# Patient Record
Sex: Female | Born: 1945 | Race: Black or African American | Hispanic: No | Marital: Married | Smoking: Never smoker
Health system: Southern US, Community
[De-identification: ages and names within clinical notes are randomized; demographics above are authoritative.]

## PROBLEM LIST (undated history)

## (undated) DIAGNOSIS — C50919 Malignant neoplasm of unspecified site of unspecified female breast: Secondary | ICD-10-CM

## (undated) DIAGNOSIS — G629 Polyneuropathy, unspecified: Secondary | ICD-10-CM

## (undated) DIAGNOSIS — H538 Other visual disturbances: Secondary | ICD-10-CM

## (undated) DIAGNOSIS — Z923 Personal history of irradiation: Secondary | ICD-10-CM

## (undated) DIAGNOSIS — I10 Essential (primary) hypertension: Secondary | ICD-10-CM

## (undated) DIAGNOSIS — E785 Hyperlipidemia, unspecified: Secondary | ICD-10-CM

## (undated) DIAGNOSIS — Z9221 Personal history of antineoplastic chemotherapy: Secondary | ICD-10-CM

## (undated) HISTORY — PX: BREAST BIOPSY: SHX20

## (undated) HISTORY — DX: Essential (primary) hypertension: I10

## (undated) HISTORY — DX: Hyperlipidemia, unspecified: E78.5

---

## 1985-08-05 HISTORY — PX: ABDOMINAL HYSTERECTOMY: SHX81

## 1991-08-06 HISTORY — PX: BUNIONECTOMY: SHX129

## 1998-02-16 ENCOUNTER — Ambulatory Visit (HOSPITAL_COMMUNITY): Admission: RE | Admit: 1998-02-16 | Discharge: 1998-02-16 | Payer: Self-pay | Admitting: Family Medicine

## 1998-12-07 ENCOUNTER — Other Ambulatory Visit: Admission: RE | Admit: 1998-12-07 | Discharge: 1998-12-07 | Payer: Self-pay | Admitting: Obstetrics and Gynecology

## 1999-03-19 ENCOUNTER — Encounter: Payer: Self-pay | Admitting: Family Medicine

## 1999-03-19 ENCOUNTER — Ambulatory Visit (HOSPITAL_COMMUNITY): Admission: RE | Admit: 1999-03-19 | Discharge: 1999-03-19 | Payer: Self-pay | Admitting: Obstetrics and Gynecology

## 2000-01-17 ENCOUNTER — Other Ambulatory Visit: Admission: RE | Admit: 2000-01-17 | Discharge: 2000-01-17 | Payer: Self-pay | Admitting: Obstetrics and Gynecology

## 2000-03-20 ENCOUNTER — Encounter: Payer: Self-pay | Admitting: Obstetrics and Gynecology

## 2000-03-20 ENCOUNTER — Ambulatory Visit (HOSPITAL_COMMUNITY): Admission: RE | Admit: 2000-03-20 | Discharge: 2000-03-20 | Payer: Self-pay | Admitting: Obstetrics and Gynecology

## 2001-03-27 ENCOUNTER — Ambulatory Visit (HOSPITAL_COMMUNITY): Admission: RE | Admit: 2001-03-27 | Discharge: 2001-03-27 | Payer: Self-pay | Admitting: Family Medicine

## 2001-03-27 ENCOUNTER — Encounter: Payer: Self-pay | Admitting: Family Medicine

## 2001-06-09 ENCOUNTER — Other Ambulatory Visit: Admission: RE | Admit: 2001-06-09 | Discharge: 2001-06-09 | Payer: Self-pay | Admitting: Obstetrics and Gynecology

## 2001-09-07 ENCOUNTER — Other Ambulatory Visit: Admission: RE | Admit: 2001-09-07 | Discharge: 2001-09-07 | Payer: Self-pay | Admitting: Obstetrics and Gynecology

## 2001-12-04 ENCOUNTER — Other Ambulatory Visit: Admission: RE | Admit: 2001-12-04 | Discharge: 2001-12-04 | Payer: Self-pay | Admitting: Obstetrics and Gynecology

## 2002-03-16 ENCOUNTER — Other Ambulatory Visit: Admission: RE | Admit: 2002-03-16 | Discharge: 2002-03-16 | Payer: Self-pay | Admitting: Obstetrics and Gynecology

## 2010-05-09 LAB — HEPATIC FUNCTION PANEL: AST: 22 U/L (ref 13–35)

## 2010-05-09 LAB — LIPID PANEL: HDL: 66 mg/dL (ref 35–70)

## 2011-01-14 ENCOUNTER — Ambulatory Visit (INDEPENDENT_AMBULATORY_CARE_PROVIDER_SITE_OTHER): Payer: Medicare Other | Admitting: Family Medicine

## 2011-01-14 ENCOUNTER — Encounter: Payer: Self-pay | Admitting: Family Medicine

## 2011-01-14 ENCOUNTER — Other Ambulatory Visit (HOSPITAL_COMMUNITY)
Admission: RE | Admit: 2011-01-14 | Discharge: 2011-01-14 | Disposition: A | Discharge status: Home / Self Care | Payer: Medicare Other | Source: Ambulatory Visit | Attending: Family Medicine | Admitting: Family Medicine

## 2011-01-14 VITALS — BP 140/80 | HR 57 | Temp 98.4°F | Ht 62.5 in | Wt 139.2 lb

## 2011-01-14 DIAGNOSIS — I10 Essential (primary) hypertension: Secondary | ICD-10-CM

## 2011-01-14 DIAGNOSIS — Z124 Encounter for screening for malignant neoplasm of cervix: Secondary | ICD-10-CM

## 2011-01-14 DIAGNOSIS — R5381 Other malaise: Secondary | ICD-10-CM

## 2011-01-14 DIAGNOSIS — L659 Nonscarring hair loss, unspecified: Secondary | ICD-10-CM | POA: Insufficient documentation

## 2011-01-14 DIAGNOSIS — R87810 Cervical high risk human papillomavirus (HPV) DNA test positive: Secondary | ICD-10-CM | POA: Insufficient documentation

## 2011-01-14 DIAGNOSIS — Z0000 Encounter for general adult medical examination without abnormal findings: Secondary | ICD-10-CM | POA: Insufficient documentation

## 2011-01-14 DIAGNOSIS — R5383 Other fatigue: Secondary | ICD-10-CM | POA: Insufficient documentation

## 2011-01-14 DIAGNOSIS — E785 Hyperlipidemia, unspecified: Secondary | ICD-10-CM

## 2011-01-14 DIAGNOSIS — Z1231 Encounter for screening mammogram for malignant neoplasm of breast: Secondary | ICD-10-CM

## 2011-01-14 DIAGNOSIS — Z0289 Encounter for other administrative examinations: Secondary | ICD-10-CM

## 2011-01-14 LAB — CBC WITH DIFFERENTIAL/PLATELET
Basophils Absolute: 0 10*3/uL (ref 0.0–0.1)
Hemoglobin: 13.4 g/dL (ref 12.0–15.0)
Lymphocytes Relative: 34.6 % (ref 12.0–46.0)
Monocytes Relative: 7.7 % (ref 3.0–12.0)
Neutro Abs: 2.2 10*3/uL (ref 1.4–7.7)
Platelets: 311 10*3/uL (ref 150.0–400.0)
RDW: 12.8 % (ref 11.5–14.6)

## 2011-01-14 LAB — TSH: TSH: 1.13 u[IU]/mL (ref 0.35–5.50)

## 2011-01-14 MED ORDER — HYDROCHLOROTHIAZIDE 25 MG PO TABS: 25.0000 mg | ORAL_TABLET | Freq: Every day | ORAL | Status: DC

## 2011-01-14 NOTE — Progress Notes (Signed)
65 yo here to establish care and for Welcome to Medicare physical.  I have personally reviewed the Medicare Annual Wellness questionnaire and have noted 1. The patient's medical and social history- see below 2. Their use of alcohol, tobacco or illicit drugs- no use of drugs 3. Their current medications and supplements- see med list 4. The patient's functional ability including ADL's, fall risks, home safety risks and hearing or visual             Impairment.- independent for all ADLs, AIDLs. 5. Diet and physical activities- very physically active 6. Evidence for depression or mood disorders- denies any signs or symptoms of depression.  No complaints other than increased hair loss over past several months.   Denies any other symptoms of hypo or hyperthyroidism. No new perms or other hair products.  HTN- has been on amlodipine 5 mg, Bisoprolol/HCTZ 10/6.25 for years. Denies any CP, SOB or LE edema. No blurred vision.  HLD- on Pravastatin 20 mg.  Denies any myalgias. Brings in labs from 05/2010.  Lab Results  Component Value Date   HDL 66 05/09/2010   Lab Results  Component Value Date   LDLCALC 73 05/09/2010   Lab Results  Component Value Date   TRIG 53 05/09/2010    Lab Results  Component Value Date   ALT 23 05/09/2010   AST 22 05/09/2010   G2P1, had a hysterecomy in the 70s, she is not sure what remains. ?cervix. Denies any vaginal discharge, postmenopausal bleeding, or dysuria.    The PMH, PSH, Social History, Family History, Medications, and allergies have been reviewed in [facility], and have been updated if relevant.  ROS: See HPI Patient reports no  vision/ hearing changes,anorexia, weight change, fever ,adenopathy, persistant / recurrent hoarseness, swallowing issues, chest pain, edema,persistant / recurrent cough, hemoptysis, dyspnea(rest, exertional, paroxysmal nocturnal), gastrointestinal  bleeding (melena, rectal bleeding), abdominal pain, excessive heart burn, GU  symptoms(dysuria, hematuria, pyuria, voiding/incontinence  Issues) syncope, focal weakness, severe memory loss, concerning skin lesions, depression, anxiety, abnormal bruising/bleeding, major joint swelling, breast masses or abnormal vaginal bleeding.    Physical exam: BP 140/80  Pulse 57  Temp(Src) 98.4 F (36.9 C) (Oral)  Ht 5' 2.5" (1.588 m)  Wt 139 lb 4 oz (63.163 kg)  BMI 25.06 kg/m2  General:  Well-developed,well-nourished,in no acute distress; alert,appropriate and cooperative throughout examination Head:  normocephalic and atraumatic.   Eyes:  vision grossly intact, pupils equal, pupils round, and pupils reactive to light.   Ears:  R ear normal and L ear normal.   Nose:  no external deformity.   Mouth:  good dentition.   Neck:  No deformities, masses, or tenderness noted. Breasts:  No mass, nodules, thickening, tenderness, bulging, retraction, inflamation, nipple discharge or skin changes noted.   Lungs:  Normal respiratory effort, chest expands symmetrically. Lungs are clear to auscultation, no crackles or wheezes. Heart:  Normal rate and regular rhythm. S1 and S2 normal without gallop, murmur, click, rub or other extra sounds. Abdomen:  Bowel sounds positive,abdomen soft and non-tender without masses, organomegaly or hernias noted. Rectal:  no external abnormalities.   Genitalia:  Pelvic Exam:        External: normal female genitalia without lesions or masses        Vagina: normal without lesions or masses        Cervix: absent        Adnexa: normal bimanual exam without masses or fullness        Uterus: absent  Pap smear: performed Msk:  No deformity or scoliosis noted of thoracic or lumbar spine.   Extremities:  No clubbing, cyanosis, edema, or deformity noted with normal full range of motion of all joints.   Neurologic:  alert & oriented X3 and gait normal.   Skin:  Intact without suspicious lesions or rashes Cervical Nodes:  No lymphadenopathy noted Axillary  Nodes:  No palpable lymphadenopathy Psych:  Cognition and judgment appear intact. Alert and cooperative with normal attention span and concentration. No apparent delusions, illusions, hallucinations  1. Hypertension   Stable but very bradycardic. Will d/c bisprolol and increase HCTZ. Pt to follow up BP in 2 weeks.  Check BMET today.   2. Hyperlipidemia  Stable.  Recheck lipid panel and hepatic panel today.   4. Routine general medical examination at a health care facility   The patients weight, height, BMI and visual acuity have been recorded in the chart I have made referrals, counseling and provided education to the patient based review of the above and I have provided the pt with a written personalized care plan for preventive services.  Cytology -Pap Smear  5. Hair loss and Fatigue- May be due to bradycardia. Will also check TSH and CBC.    TSH, CBC

## 2011-01-14 NOTE — Patient Instructions (Signed)
Please stop taking your bisporolol/HCTZ.  Continue Amlodipine and start taking HCTZ 25 mg daily. Come in 2 weeks for a nurse visit to recheck your blood pressure. Please stop by to see Shirlee Limerick on your way out.

## 2011-01-15 ENCOUNTER — Encounter: Payer: Self-pay | Admitting: *Deleted

## 2011-01-15 ENCOUNTER — Other Ambulatory Visit: Payer: Medicare Other

## 2011-01-15 ENCOUNTER — Other Ambulatory Visit: Payer: Self-pay | Admitting: Family Medicine

## 2011-01-15 DIAGNOSIS — Z1211 Encounter for screening for malignant neoplasm of colon: Secondary | ICD-10-CM

## 2011-01-15 LAB — FECAL OCCULT BLOOD, IMMUNOCHEMICAL: Fecal Occult Bld: NEGATIVE

## 2011-01-31 ENCOUNTER — Ambulatory Visit: Payer: Medicare Other | Admitting: Family Medicine

## 2011-01-31 VITALS — BP 126/82 | HR 77 | Resp 18

## 2011-01-31 DIAGNOSIS — I10 Essential (primary) hypertension: Secondary | ICD-10-CM

## 2011-01-31 NOTE — Progress Notes (Signed)
  Subjective:    Patient ID: Sandra [name], female    DOB: 12-Mar-1946, 65 y.o.   MRN: [id number]  HPI    Review of Systems     Objective:   Physical Exam        Assessment & Plan:

## 2011-01-31 NOTE — Progress Notes (Signed)
  Subjective:    Patient ID: Sandra [name], female    DOB: 01-05-1946, 65 y.o.   MRN: [id number]  HPI  Patient coming in for blood pressure check, comparing her machine to ours.  Review of Systems     Objective:   Physical Exam        Assessment & Plan:

## 2011-02-21 ENCOUNTER — Ambulatory Visit: Payer: Self-pay | Admitting: Family Medicine

## 2011-03-01 ENCOUNTER — Encounter: Payer: Self-pay | Admitting: *Deleted

## 2011-03-01 ENCOUNTER — Encounter: Payer: Self-pay | Admitting: Family Medicine

## 2011-03-08 ENCOUNTER — Encounter: Payer: Self-pay | Admitting: Family Medicine

## 2011-03-08 ENCOUNTER — Ambulatory Visit (INDEPENDENT_AMBULATORY_CARE_PROVIDER_SITE_OTHER): Payer: Medicare Other | Admitting: Family Medicine

## 2011-03-08 DIAGNOSIS — L659 Nonscarring hair loss, unspecified: Secondary | ICD-10-CM

## 2011-03-08 DIAGNOSIS — R5383 Other fatigue: Secondary | ICD-10-CM

## 2011-03-08 DIAGNOSIS — R5381 Other malaise: Secondary | ICD-10-CM

## 2011-03-08 NOTE — Progress Notes (Signed)
No appetite for 4 days.  Ate Monday night but little solids since then.  Fatigued.  Some diarrhea on Tuesday.  Taking liquids, making urine w/o complication.  No FCNAV.  No sick contacts.  No pain, cough, chills, or other sx.  No blood in stool.  She had some hair loss over last 6 months.  Weight is stable.    Mother and sister with h/o hair loss.    TSH and other labs recently unremarkable.   Meds, vitals, and allergies reviewed.   ROS: See HPI.  Otherwise, noncontributory.  GEN: nad, alert and oriented HEENT: mucous membranes moist, tm wnl, B temporal balding NECK: supple w/o LA CV: rrr PULM: ctab, no inc wob ABD: soft, +bs, not ttp EXT: no edema SKIN: no acute rash

## 2011-03-08 NOTE — Patient Instructions (Signed)
Let us know if your appetite doesn't return to normal by next week, if the hair loss continues, or if you have other concerns.  Take care.

## 2011-03-09 NOTE — Assessment & Plan Note (Signed)
With prev labs noted and unremarkable.  4 days of dec in appetite.  This may be a superimposed viral illness, with the episode of diarrhea.  I would follow this clinically.  She doesn't appear depressed.  Affect wnl and she doesn't have depressive sx.  We talked about the plan and she agrees.

## 2011-03-09 NOTE — Assessment & Plan Note (Signed)
She'll let us know if progressive.  It appears to be familial.  Can refer to derm if worsening.  Scalp appears wnl, except for the thinning hair.

## 2011-05-13 ENCOUNTER — Other Ambulatory Visit: Payer: Self-pay | Admitting: Family Medicine

## 2011-07-02 ENCOUNTER — Other Ambulatory Visit: Payer: Self-pay | Admitting: *Deleted

## 2011-07-02 MED ORDER — AMLODIPINE BESYLATE 5 MG PO TABS: 5.0000 mg | ORAL_TABLET | Freq: Every day | ORAL | Status: DC

## 2011-07-09 ENCOUNTER — Encounter: Payer: Self-pay | Admitting: Family Medicine

## 2011-07-09 ENCOUNTER — Ambulatory Visit: Payer: Self-pay | Admitting: Family Medicine

## 2011-07-09 ENCOUNTER — Ambulatory Visit (INDEPENDENT_AMBULATORY_CARE_PROVIDER_SITE_OTHER): Payer: Medicare Other | Admitting: Family Medicine

## 2011-07-09 VITALS — BP 120/74 | HR 94 | Temp 98.9°F | Ht 62.25 in | Wt 137.5 lb

## 2011-07-09 DIAGNOSIS — J069 Acute upper respiratory infection, unspecified: Secondary | ICD-10-CM

## 2011-07-09 NOTE — Progress Notes (Signed)
SUBJECTIVE:  Sandra [name] is a 65 y.o. female who complains of coryza, congestion, sore throat and dry cough for 3 days. She denies a history of anorexia, chest pain, dizziness, fatigue and fevers and denies a history of asthma. Patient denies smoke cigarettes.   Patient Active Problem List  Diagnoses  . Hypertension  . Hyperlipidemia  . Routine general medical examination at a health care facility  . Hair loss  . Fatigue  . URI (upper respiratory infection)   Past Medical History  Diagnosis Date  . Hypertension   . Hyperlipidemia    Past Surgical History  Procedure Date  . Abdominal hysterectomy    History  Substance Use Topics  . Smoking status: Never Smoker   . Smokeless tobacco: Not on file  . Alcohol Use: Not on file   Family History  Problem Relation Age of Onset  . Cancer Sister 30    breast cancer   No Known Allergies Current Outpatient Prescriptions on File Prior to Visit  Medication Sig Dispense Refill  . amLODipine (NORVASC) 5 MG tablet Take 1 tablet (5 mg total) by mouth daily.  30 tablet  6  . Biotin 5000 MCG CAPS Take 1 capsule by mouth daily.        . Calcium Carbonate (CALCIUM 500 PO) Take 1 tablet by mouth daily.        . hydrochlorothiazide (HYDRODIURIL) 25 MG tablet TAKE ONE TABLET BY MOUTH EVERY DAY  30 tablet  6  . pravastatin (PRAVACHOL) 20 MG tablet Take 20 mg by mouth daily.        . vitamin B-12 (CYANOCOBALAMIN) 1000 MCG tablet Take 1,000 mcg by mouth daily.         The PMH, PSH, Social History, Family History, Medications, and allergies have been reviewed in [facility], and have been updated if relevant.  OBJECTIVE: BP 120/74  Pulse 94  Temp(Src) 98.9 F (37.2 C) (Oral)  Ht 5' 2.25" (1.581 m)  Wt 137 lb 8 oz (62.37 kg)  BMI 24.95 kg/m2  She appears well, vital signs are as noted. Ears normal.  Throat and pharynx normal.  Neck supple. No adenopathy in the neck. Nose is congested. Sinuses non tender. The chest is clear, without wheezes or  rales.  ASSESSMENT:  viral upper respiratory illness  PLAN: Symptomatic therapy suggested: push fluids, rest and return office visit prn if symptoms persist or worsen. Lack of antibiotic effectiveness discussed with her. Call or return to clinic prn if these symptoms worsen or fail to improve as anticipated.

## 2011-07-17 ENCOUNTER — Other Ambulatory Visit: Payer: Self-pay | Admitting: *Deleted

## 2011-07-17 MED ORDER — PRAVASTATIN SODIUM 20 MG PO TABS: 20.0000 mg | ORAL_TABLET | Freq: Every day | ORAL | Status: DC

## 2011-08-06 HISTORY — PX: OTHER SURGICAL HISTORY: SHX169

## 2011-10-28 ENCOUNTER — Ambulatory Visit (INDEPENDENT_AMBULATORY_CARE_PROVIDER_SITE_OTHER)
Admission: RE | Admit: 2011-10-28 | Discharge: 2011-10-28 | Disposition: A | Discharge status: Home / Self Care | Payer: Medicare Other | Source: Ambulatory Visit | Attending: Family Medicine | Admitting: Family Medicine

## 2011-10-28 ENCOUNTER — Encounter: Payer: Self-pay | Admitting: Family Medicine

## 2011-10-28 ENCOUNTER — Ambulatory Visit (INDEPENDENT_AMBULATORY_CARE_PROVIDER_SITE_OTHER): Payer: Medicare Other | Admitting: Family Medicine

## 2011-10-28 VITALS — BP 110/80 | HR 72 | Temp 97.9°F | Wt 136.0 lb

## 2011-10-28 DIAGNOSIS — M719 Bursopathy, unspecified: Secondary | ICD-10-CM

## 2011-10-28 DIAGNOSIS — M75102 Unspecified rotator cuff tear or rupture of left shoulder, not specified as traumatic: Secondary | ICD-10-CM | POA: Insufficient documentation

## 2011-10-28 DIAGNOSIS — M67919 Unspecified disorder of synovium and tendon, unspecified shoulder: Secondary | ICD-10-CM

## 2011-10-28 MED ORDER — TRAMADOL HCL 50 MG PO TABS: 50.0000 mg | ORAL_TABLET | Freq: Three times a day (TID) | ORAL | Status: AC | PRN

## 2011-10-28 NOTE — Progress Notes (Signed)
SUBJECTIVE: Sandra [name] is a 66 y.o. female who sustained a left shoulder injury 1 month(s) ago. Mechanism of injury: not sure- she was moving- lifting heavy furniture. Immediate symptoms: delayed pain. Symptoms have been chronic since that time. Prior history of related problems: no prior problems with this area in the past.  Patient Active Problem List  Diagnoses  . Hypertension  . Hyperlipidemia  . Routine general medical examination at a health care facility  . Hair loss  . Fatigue  . URI (upper respiratory infection)  . Rotator cuff syndrome of left shoulder   Past Medical History  Diagnosis Date  . Hypertension   . Hyperlipidemia    Past Surgical History  Procedure Date  . Abdominal hysterectomy    History  Substance Use Topics  . Smoking status: Never Smoker   . Smokeless tobacco: Not on file  . Alcohol Use: Not on file   Family History  Problem Relation Age of Onset  . Cancer Sister 25    breast cancer   No Known Allergies Current Outpatient Prescriptions on File Prior to Visit  Medication Sig Dispense Refill  . amLODipine (NORVASC) 5 MG tablet Take 1 tablet (5 mg total) by mouth daily.  30 tablet  6  . Biotin 5000 MCG CAPS Take 1 capsule by mouth daily.        . Calcium Carbonate (CALCIUM 500 PO) Take 1 tablet by mouth daily.        . hydrochlorothiazide (HYDRODIURIL) 25 MG tablet TAKE ONE TABLET BY MOUTH EVERY DAY  30 tablet  6  . pravastatin (PRAVACHOL) 20 MG tablet Take 1 tablet (20 mg total) by mouth daily.  30 tablet  6  . vitamin B-12 (CYANOCOBALAMIN) 1000 MCG tablet Take 1,000 mcg by mouth daily.         The PMH, PSH, Social History, Family History, Medications, and allergies have been reviewed in [facility], and have been updated if relevant.  OBJECTIVE: BP 110/80  Pulse 72  Temp(Src) 97.9 F (36.6 C) (Oral)  Wt 136 lb (61.689 kg)  Vital signs as noted above. Appearance: alert, well appearing, and in no distress. Shoulder exam: no tenderness- pos  empty can test, pos arch sign, unable to touch thumb to middle of back due to pain X-ray: ordered, but results not yet available.  ASSESSMENT: Shoulder  Rotator cuff injury- tendonitis vs partial tear  PLAN: rest the injured area as much as practical, apply heat (warned not to sleep on heating pad), PT. See orders for this visit as documented in the electronic medical record.

## 2011-10-28 NOTE — Patient Instructions (Signed)
You have rotator cuff impingement Try to avoid painful activities (overhead activities, lifting with extended arm) as much as possible. Aleve, tramadol and/or tylenol as needed for pain.  Start physical therapy with transition to home exercise program. If not improving at follow-up we will consider further imaging.

## 2011-12-09 ENCOUNTER — Other Ambulatory Visit: Payer: Self-pay | Admitting: Family Medicine

## 2011-12-12 ENCOUNTER — Other Ambulatory Visit: Payer: Self-pay | Admitting: Family Medicine

## 2011-12-12 NOTE — Telephone Encounter (Signed)
Pt called to ck on refill HCTZ 25 mg. Filled on 12/09/11 at [facility] [address]. Pt will ck with pharmacy for refill.

## 2012-01-02 ENCOUNTER — Other Ambulatory Visit: Payer: Self-pay | Admitting: Family Medicine

## 2012-01-02 DIAGNOSIS — E538 Deficiency of other specified B group vitamins: Secondary | ICD-10-CM

## 2012-01-02 DIAGNOSIS — Z0000 Encounter for general adult medical examination without abnormal findings: Secondary | ICD-10-CM

## 2012-01-02 DIAGNOSIS — I10 Essential (primary) hypertension: Secondary | ICD-10-CM

## 2012-01-02 DIAGNOSIS — E785 Hyperlipidemia, unspecified: Secondary | ICD-10-CM

## 2012-01-08 ENCOUNTER — Other Ambulatory Visit (INDEPENDENT_AMBULATORY_CARE_PROVIDER_SITE_OTHER): Payer: Medicare Other

## 2012-01-08 DIAGNOSIS — I10 Essential (primary) hypertension: Secondary | ICD-10-CM

## 2012-01-08 DIAGNOSIS — E538 Deficiency of other specified B group vitamins: Secondary | ICD-10-CM

## 2012-01-08 DIAGNOSIS — E785 Hyperlipidemia, unspecified: Secondary | ICD-10-CM

## 2012-01-08 LAB — LIPID PANEL
LDL Cholesterol: 58 mg/dL (ref 0–99)
Total CHOL/HDL Ratio: 2

## 2012-01-08 LAB — COMPREHENSIVE METABOLIC PANEL
AST: 22 U/L (ref 0–37)
Albumin: 4 g/dL (ref 3.5–5.2)
Alkaline Phosphatase: 42 U/L (ref 39–117)
Calcium: 8.8 mg/dL (ref 8.4–10.5)
Chloride: 105 mEq/L (ref 96–112)
Potassium: 3.4 mEq/L — ABNORMAL LOW (ref 3.5–5.1)
Sodium: 140 mEq/L (ref 135–145)
Total Protein: 6.7 g/dL (ref 6.0–8.3)

## 2012-01-08 LAB — VITAMIN B12: Vitamin B-12: 323 pg/mL (ref 211–911)

## 2012-01-15 ENCOUNTER — Encounter: Payer: Self-pay | Admitting: Family Medicine

## 2012-01-15 ENCOUNTER — Ambulatory Visit (INDEPENDENT_AMBULATORY_CARE_PROVIDER_SITE_OTHER): Payer: Medicare Other | Admitting: Family Medicine

## 2012-01-15 ENCOUNTER — Encounter: Payer: Self-pay | Admitting: Internal Medicine

## 2012-01-15 VITALS — BP 112/80 | HR 68 | Temp 97.9°F | Ht 62.0 in | Wt 132.0 lb

## 2012-01-15 DIAGNOSIS — I10 Essential (primary) hypertension: Secondary | ICD-10-CM

## 2012-01-15 DIAGNOSIS — Z23 Encounter for immunization: Secondary | ICD-10-CM

## 2012-01-15 DIAGNOSIS — Z1211 Encounter for screening for malignant neoplasm of colon: Secondary | ICD-10-CM

## 2012-01-15 DIAGNOSIS — Z1231 Encounter for screening mammogram for malignant neoplasm of breast: Secondary | ICD-10-CM

## 2012-01-15 DIAGNOSIS — D179 Benign lipomatous neoplasm, unspecified: Secondary | ICD-10-CM

## 2012-01-15 DIAGNOSIS — Z780 Asymptomatic menopausal state: Secondary | ICD-10-CM

## 2012-01-15 DIAGNOSIS — E785 Hyperlipidemia, unspecified: Secondary | ICD-10-CM

## 2012-01-15 DIAGNOSIS — Z0000 Encounter for general adult medical examination without abnormal findings: Secondary | ICD-10-CM

## 2012-01-15 MED ORDER — AMLODIPINE BESYLATE 5 MG PO TABS: 5.0000 mg | ORAL_TABLET | Freq: Every day | ORAL | Status: DC

## 2012-01-15 MED ORDER — HYDROCHLOROTHIAZIDE 25 MG PO TABS: ORAL_TABLET | ORAL | Status: DC

## 2012-01-15 MED ORDER — TRAMADOL HCL 50 MG PO TABS: 50.0000 mg | ORAL_TABLET | Freq: Four times a day (QID) | ORAL | Status: DC | PRN

## 2012-01-15 MED ORDER — PRAVASTATIN SODIUM 20 MG PO TABS: 20.0000 mg | ORAL_TABLET | Freq: Every day | ORAL | Status: DC

## 2012-01-15 NOTE — Progress Notes (Signed)
66 yo here for AMW visit.  I have personally reviewed the Medicare Annual Wellness questionnaire and have noted 1. The patient's medical and social history- see below 2. Their use of alcohol, tobacco or illicit drugs- no use of drugs 3. Their current medications and supplements- see med list 4. The patient's functional ability including ADL's, fall risks, home safety risks and hearing or visual             Impairment.- independent for all ADLs, AIDLs. 5. Diet and physical activities- very physically active 6. Evidence for depression or mood disorders- denies any signs or symptoms of depression.  No complaints other than increased hair loss over past several months.   Denies any other symptoms of hypo or hyperthyroidism. No new perms or other hair products.  HTN- has previously  been on amlodipine 5 mg, Bisoprolol/HCTZ 10/6.25 for years. Last June, very bradycardic at first appointment, so we d/c'd bisprolol and increase HCTZ. Denies any CP, SOB or LE edema. No blurred vision. Lab Results  Component Value Date   CREATININE 0.9 01/08/2012    HLD- on Pravastatin 20 mg.  Denies any myalgias.   Lab Results  Component Value Date   CHOL 131 01/08/2012   HDL 66.60 01/08/2012   LDLCALC 58 01/08/2012   TRIG 32.0 01/08/2012   CHOLHDL 2 01/08/2012    G2P1, had a hysterecomy in the 70s. Pap/pelvic exam last year.  Denies any vaginal discharge, postmenopausal bleeding, or dysuria.    The PMH, PSH, Social History, Family History, Medications, and allergies have been reviewed in [facility], and have been updated if relevant.  ROS: See HPI Patient reports no  vision/ hearing changes,anorexia, weight change, fever ,adenopathy, persistant / recurrent hoarseness, swallowing issues, chest pain, edema,persistant / recurrent cough, hemoptysis, dyspnea(rest, exertional, paroxysmal nocturnal), gastrointestinal  bleeding (melena, rectal bleeding), abdominal pain, excessive heart burn, GU symptoms(dysuria, hematuria,  pyuria, voiding/incontinence  Issues) syncope, focal weakness, severe memory loss, concerning skin lesions, depression, anxiety, abnormal bruising/bleeding, major joint swelling, breast masses or abnormal vaginal bleeding.    Physical exam: BP 112/80  Pulse 68  Temp 97.9 F (36.6 C)  Ht 5\' 2"  (1.575 m)  Wt 132 lb (59.875 kg)  BMI 24.14 kg/m2 Wt Readings from Last 3 Encounters:  01/15/12 132 lb (59.875 kg)  10/28/11 136 lb (61.689 kg)  07/09/11 137 lb 8 oz (62.37 kg)   General:  Well-developed,well-nourished,in no acute distress; alert,appropriate and cooperative throughout examination Head:  normocephalic and atraumatic.   Eyes:  vision grossly intact, pupils equal, pupils round, and pupils reactive to light.   Ears:  R ear normal and L ear normal.   Nose:  no external deformity.   Mouth:  good dentition.   Neck:  No deformities, masses, or tenderness noted. Lungs:  Normal respiratory effort, chest expands symmetrically. Lungs are clear to auscultation, no crackles or wheezes. Heart:  Normal rate and regular rhythm. S1 and S2 normal without gallop, murmur, click, rub or other extra sounds. Abdomen:  Bowel sounds positive,abdomen soft and non-tender without masses, organomegaly or hernias noted. Msk:  No deformity or scoliosis noted of thoracic or lumbar spine.   Extremities:  No clubbing, cyanosis, edema, or deformity noted with normal full range of motion of all joints.   Neurologic:  alert & oriented X3 and gait normal.   Skin:   2.5 inch large freely movable, nontender mass left side, consistent with lipoma Cervical Nodes:  No lymphadenopathy noted Axillary Nodes:  No palpable lymphadenopathy Psych:  Cognition and judgment appear intact. Alert and cooperative with normal attention span and concentration. No apparent delusions, illusions, hallucinations  Assessment and Plan: 1. Routine general medical examination at a health care facility  The patients weight, height, BMI and  visual acuity have been recorded in the chart I have made referrals, counseling and provided education to the patient based review of the above and I have provided the pt with a written personalized care plan for preventive services.    2. Hypertension  Stable on current meds.   3. Hyperlipidemia  Lipid panel improved!   4. Postmenopausal  DG Bone Density  5. Other screening mammogram  MM Digital Screening  6. Colon cancer screening  Ambulatory referral to Gastroenterology  7. Lipoma  Ambulatory referral to General Surgery

## 2012-01-15 NOTE — Addendum Note (Signed)
Addended by: Eliezer Bottom on: 01/15/2012 09:14 AM   Modules accepted: Orders

## 2012-01-15 NOTE — Patient Instructions (Addendum)
Good to see you. Please stop by to see Marion on your way out to set up your referrals.   

## 2012-01-28 ENCOUNTER — Other Ambulatory Visit: Payer: Self-pay | Admitting: Family Medicine

## 2012-02-18 ENCOUNTER — Ambulatory Visit: Payer: Self-pay | Admitting: Anesthesiology

## 2012-02-24 ENCOUNTER — Ambulatory Visit: Payer: Self-pay | Admitting: General Surgery

## 2012-03-05 ENCOUNTER — Encounter: Payer: Self-pay | Admitting: Family Medicine

## 2012-03-05 ENCOUNTER — Ambulatory Visit: Payer: Self-pay | Admitting: Family Medicine

## 2012-03-09 ENCOUNTER — Encounter: Payer: Self-pay | Admitting: Family Medicine

## 2012-03-09 ENCOUNTER — Encounter: Payer: Self-pay | Admitting: *Deleted

## 2012-03-09 LAB — HM MAMMOGRAPHY: HM Mammogram: NORMAL

## 2012-03-12 ENCOUNTER — Encounter: Payer: Medicare Other | Admitting: Internal Medicine

## 2012-04-08 ENCOUNTER — Ambulatory Visit: Payer: Self-pay | Admitting: Family Medicine

## 2012-04-08 ENCOUNTER — Encounter: Payer: Self-pay | Admitting: Family Medicine

## 2013-01-21 ENCOUNTER — Other Ambulatory Visit: Payer: Self-pay | Admitting: Family Medicine

## 2013-01-21 ENCOUNTER — Other Ambulatory Visit (INDEPENDENT_AMBULATORY_CARE_PROVIDER_SITE_OTHER): Payer: Medicare PPO

## 2013-01-21 DIAGNOSIS — E785 Hyperlipidemia, unspecified: Secondary | ICD-10-CM

## 2013-01-21 DIAGNOSIS — I10 Essential (primary) hypertension: Secondary | ICD-10-CM

## 2013-01-21 DIAGNOSIS — R5381 Other malaise: Secondary | ICD-10-CM

## 2013-01-21 DIAGNOSIS — Z0000 Encounter for general adult medical examination without abnormal findings: Secondary | ICD-10-CM

## 2013-01-21 DIAGNOSIS — R5383 Other fatigue: Secondary | ICD-10-CM

## 2013-01-21 LAB — LIPID PANEL
Cholesterol: 137 mg/dL (ref 0–200)
HDL: 59.6 mg/dL (ref >39.00)
Triglycerides: 39 mg/dL (ref 0.0–149.0)

## 2013-01-21 LAB — COMPREHENSIVE METABOLIC PANEL
AST: 22 U/L (ref 0–37)
BUN: 13 mg/dL (ref 6–23)
CO2: 26 mEq/L (ref 19–32)
Calcium: 9.2 mg/dL (ref 8.4–10.5)
Chloride: 104 mEq/L (ref 96–112)
Creatinine, Ser: 0.9 mg/dL (ref 0.4–1.2)
GFR: 85.54 mL/min (ref >60.00)

## 2013-01-21 LAB — CBC WITH DIFFERENTIAL/PLATELET
Basophils Absolute: 0.1 10*3/uL (ref 0.0–0.1)
HCT: 38.8 % (ref 36.0–46.0)
Lymphs Abs: 1.3 10*3/uL (ref 0.7–4.0)
MCV: 89.7 fl (ref 78.0–100.0)
Monocytes Absolute: 0.3 10*3/uL (ref 0.1–1.0)
Platelets: 307 10*3/uL (ref 150.0–400.0)
RDW: 12.7 % (ref 11.5–14.6)

## 2013-01-26 ENCOUNTER — Other Ambulatory Visit (HOSPITAL_COMMUNITY)
Admission: RE | Admit: 2013-01-26 | Discharge: 2013-01-26 | Disposition: A | Discharge status: Home / Self Care | Payer: Medicare PPO | Source: Ambulatory Visit | Attending: Family Medicine | Admitting: Family Medicine

## 2013-01-26 ENCOUNTER — Ambulatory Visit (INDEPENDENT_AMBULATORY_CARE_PROVIDER_SITE_OTHER): Payer: Medicare PPO | Admitting: Family Medicine

## 2013-01-26 ENCOUNTER — Encounter: Payer: Self-pay | Admitting: Family Medicine

## 2013-01-26 VITALS — BP 110/70 | HR 68 | Temp 98.2°F | Ht 62.25 in | Wt 133.0 lb

## 2013-01-26 DIAGNOSIS — Z01419 Encounter for gynecological examination (general) (routine) without abnormal findings: Secondary | ICD-10-CM

## 2013-01-26 DIAGNOSIS — R739 Hyperglycemia, unspecified: Secondary | ICD-10-CM | POA: Insufficient documentation

## 2013-01-26 DIAGNOSIS — L819 Disorder of pigmentation, unspecified: Secondary | ICD-10-CM | POA: Insufficient documentation

## 2013-01-26 DIAGNOSIS — Z124 Encounter for screening for malignant neoplasm of cervix: Secondary | ICD-10-CM | POA: Insufficient documentation

## 2013-01-26 DIAGNOSIS — E785 Hyperlipidemia, unspecified: Secondary | ICD-10-CM

## 2013-01-26 DIAGNOSIS — R7309 Other abnormal glucose: Secondary | ICD-10-CM

## 2013-01-26 DIAGNOSIS — I10 Essential (primary) hypertension: Secondary | ICD-10-CM

## 2013-01-26 DIAGNOSIS — Z0000 Encounter for general adult medical examination without abnormal findings: Secondary | ICD-10-CM

## 2013-01-26 DIAGNOSIS — Z1331 Encounter for screening for depression: Secondary | ICD-10-CM

## 2013-01-26 LAB — HEMOGLOBIN A1C: Hgb A1c MFr Bld: 5 % (ref 4.6–6.5)

## 2013-01-26 MED ORDER — TRAMADOL HCL 50 MG PO TABS: 50.0000 mg | ORAL_TABLET | Freq: Four times a day (QID) | ORAL | Status: DC | PRN

## 2013-01-26 MED ORDER — AMLODIPINE BESYLATE 5 MG PO TABS: ORAL_TABLET | ORAL | Status: DC

## 2013-01-26 MED ORDER — HYDROCHLOROTHIAZIDE 25 MG PO TABS: ORAL_TABLET | ORAL | Status: DC

## 2013-01-26 MED ORDER — PRAVASTATIN SODIUM 20 MG PO TABS: 20.0000 mg | ORAL_TABLET | Freq: Every day | ORAL | Status: DC

## 2013-01-26 NOTE — Progress Notes (Signed)
67 yo pleasant female here for AMW visit.  I have personally reviewed the Medicare Annual Wellness questionnaire and have noted 1. The patient's medical and social history- see below 2. Their use of alcohol, tobacco or illicit drugs- no use of drugs 3. Their current medications and supplements- see med list 4. The patient's functional ability including ADL's, fall risks, home safety risks and hearing or visual             Impairment.- independent for all ADLs, AIDLs. 5. Diet and physical activities- very physically active 6. Evidence for depression or mood disorders- denies any signs or symptoms of depression.  End of life wishes discussed and updated in Social History.  Hyperglycemia- fasting CBG 120.  Denies any increased thirst or urination.  In fact has been walking 20 miles per week!  HTN- has previously  been on amlodipine 5 mg, HCTZ.    Denies any CP, SOB or LE edema. No blurred vision. Lab Results  Component Value Date   CREATININE 0.9 01/21/2013    HLD- on Pravastatin 20 mg.  Denies any myalgias.   Lab Results  Component Value Date   CHOL 137 01/21/2013   HDL 59.60 01/21/2013   LDLCALC 70 01/21/2013   TRIG 39.0 01/21/2013   CHOLHDL 2 01/21/2013    G2P1, had a hysterecomy in the 70s.  Denies any vaginal discharge, postmenopausal bleeding, or dysuria.   She was told a few years ago, she had some "changes of HPV."  Has not had a pelvic exam in a few years.  UTD mammogram. Has had both zostavax and pneumovax. Bone density normal in 04/2012. Referred to GI last year for colonoscopy- was not time yet for colonoscopy.  Due next year.   Appetite good.  Weight stable. Wt Readings from Last 3 Encounters:  01/26/13 133 lb (60.328 kg)  01/15/12 132 lb (59.875 kg)  10/28/11 136 lb (61.689 kg)     Patient Active Problem List   Diagnosis Date Noted  . Lipoma 01/15/2012  . Routine general medical examination at a health care facility 01/14/2011  . Hair loss 01/14/2011  .  Fatigue 01/14/2011  . Hypertension   . Hyperlipidemia    Past Medical History  Diagnosis Date  . Hypertension   . Hyperlipidemia    Past Surgical History  Procedure Laterality Date  . Abdominal hysterectomy     History  Substance Use Topics  . Smoking status: Never Smoker   . Smokeless tobacco: Not on file  . Alcohol Use: Not on file   Family History  Problem Relation Age of Onset  . Cancer Sister 76    breast cancer   Allergies  Allergen Reactions  . Lovastatin Anaphylaxis    Tongue swelling   Current Outpatient Prescriptions on File Prior to Visit  Medication Sig Dispense Refill  . amLODipine (NORVASC) 5 MG tablet Take 1 tablet (5 mg total) by mouth daily.  90 tablet  3  . amLODipine (NORVASC) 5 MG tablet TAKE ONE TABLET BY MOUTH EVERY DAY  30 tablet  11  . Biotin 5000 MCG CAPS Take 1 capsule by mouth daily.        . Calcium Carbonate (CALCIUM 500 PO) Take 1 tablet by mouth daily.        . hydrochlorothiazide (HYDRODIURIL) 25 MG tablet TAKE ONE TABLET BY MOUTH EVERY DAY  90 tablet  3  . pravastatin (PRAVACHOL) 20 MG tablet Take 1 tablet (20 mg total) by mouth daily.  90 tablet  3  . traMADol (ULTRAM) 50 MG tablet Take 1 tablet (50 mg total) by mouth every 6 (six) hours as needed.  30 tablet  1  . vitamin B-12 (CYANOCOBALAMIN) 1000 MCG tablet Take 1,000 mcg by mouth daily.         No current facility-administered medications on file prior to visit.     The PMH, PSH, Social History, Family History, Medications, and allergies have been reviewed in [facility], and have been updated if relevant.  ROS: See HPI Patient reports no  vision/ hearing changes,anorexia, weight change, fever ,adenopathy, persistant / recurrent hoarseness, swallowing issues, chest pain, edema,persistant / recurrent cough, hemoptysis, dyspnea(rest, exertional, paroxysmal nocturnal), gastrointestinal  bleeding (melena, rectal bleeding), abdominal pain, excessive heart burn, GU symptoms(dysuria, hematuria,  pyuria, voiding/incontinence  Issues) syncope, focal weakness, severe memory loss, concerning skin lesions, depression, anxiety, abnormal bruising/bleeding, major joint swelling, breast masses or abnormal vaginal bleeding.    Physical exam: BP 110/70  Pulse 68  Temp(Src) 98.2 F (36.8 C)  Ht 5' 2.25" (1.581 m)  Wt 133 lb (60.328 kg)  BMI 24.14 kg/m2   Wt Readings from Last 3 Encounters:  01/26/13 133 lb (60.328 kg)  01/15/12 132 lb (59.875 kg)  10/28/11 136 lb (61.689 kg)    General:  Well-developed,well-nourished,in no acute distress; alert,appropriate and cooperative throughout examination Head:  normocephalic and atraumatic.   Eyes:  vision grossly intact, pupils equal, pupils round, and pupils reactive to light.   Ears:  R ear normal and L ear normal.   Nose:  no external deformity.   Mouth:  good dentition.   Neck:  No deformities, masses, or tenderness noted. Lungs:  Normal respiratory effort, chest expands symmetrically. Lungs are clear to auscultation, no crackles or wheezes. Heart:  Normal rate and regular rhythm. S1 and S2 normal without gallop, murmur, click, rub or other extra sounds. Abdomen:  Bowel sounds positive,abdomen soft and non-tender without masses, organomegaly or hernias noted. Msk:  No deformity or scoliosis noted of thoracic or lumbar spine.   Extremities:  No clubbing, cyanosis, edema, or deformity noted with normal full range of motion of all joints.   Neurologic:  alert & oriented X3 and gait normal.   Skin:   No lymphadenopathy noted Areas of hyperpigmentation on cheeks bilaterally ( per pt has been there for years but getting larger). Axillary Nodes:  No palpable lymphadenopathy Psych:  Cognition and judgment appear intact. Alert and cooperative with normal attention span and concentration. No apparent delusions, illusions, hallucinations Genitalia:  Pelvic Exam:        External: normal female genitalia without lesions or masses        Vagina:  normal without lesions or masses        Cervix: absent        Adnexa: normal bimanual exam without masses or fullness        Uterus: absent        Pap smear: performed  Assessment and Plan: 1. Routine general medical examination at a health care facility The patients weight, height, BMI and visual acuity have been recorded in the chart I have made referrals, counseling and provided education to the patient based review of the above and I have provided the pt with a written personalized care plan for preventive services.  - Cytology - PAP  2. Hypertension Well controlled.  3. Hyperlipidemia Stable on current meds.  4. Hyperpigmentation  - Ambulatory referral to Dermatology  5. Hyperglycemia Check a1c today. - Hemoglobin A1c

## 2013-01-26 NOTE — Patient Instructions (Addendum)
Good to see you. We will call you with your lab result. Please come back to see me in 6 months.  Please stop by to see Shirlee Limerick on your way out to set up your referral to the dermatologist.

## 2013-01-27 ENCOUNTER — Encounter: Payer: Medicare Other | Admitting: Family Medicine

## 2013-01-29 ENCOUNTER — Encounter: Payer: Self-pay | Admitting: Family Medicine

## 2013-01-29 ENCOUNTER — Encounter: Payer: Self-pay | Admitting: *Deleted

## 2013-01-29 LAB — HM PAP SMEAR: HM Pap smear: NORMAL

## 2013-03-08 ENCOUNTER — Encounter: Payer: Self-pay | Admitting: Family Medicine

## 2013-03-08 ENCOUNTER — Ambulatory Visit: Payer: Self-pay | Admitting: Family Medicine

## 2013-03-09 ENCOUNTER — Encounter: Payer: Self-pay | Admitting: *Deleted

## 2013-03-09 ENCOUNTER — Encounter: Payer: Self-pay | Admitting: Family Medicine

## 2013-07-22 ENCOUNTER — Other Ambulatory Visit: Payer: Self-pay

## 2013-07-22 MED ORDER — AMLODIPINE BESYLATE 5 MG PO TABS: ORAL_TABLET | ORAL | Status: DC

## 2013-08-03 ENCOUNTER — Other Ambulatory Visit: Payer: Self-pay

## 2013-08-03 MED ORDER — HYDROCHLOROTHIAZIDE 25 MG PO TABS: ORAL_TABLET | ORAL | Status: DC

## 2013-08-03 MED ORDER — PRAVASTATIN SODIUM 20 MG PO TABS: 20.0000 mg | ORAL_TABLET | Freq: Every day | ORAL | Status: DC

## 2013-08-05 HISTORY — PX: COLONOSCOPY: SHX5424

## 2013-09-03 ENCOUNTER — Ambulatory Visit (INDEPENDENT_AMBULATORY_CARE_PROVIDER_SITE_OTHER): Payer: Medicare PPO | Admitting: Internal Medicine

## 2013-09-03 ENCOUNTER — Encounter: Payer: Self-pay | Admitting: Internal Medicine

## 2013-09-03 VITALS — BP 120/68 | HR 77 | Temp 98.3°F | Wt 132.5 lb

## 2013-09-03 DIAGNOSIS — H918X9 Other specified hearing loss, unspecified ear: Secondary | ICD-10-CM

## 2013-09-03 DIAGNOSIS — H6120 Impacted cerumen, unspecified ear: Secondary | ICD-10-CM

## 2013-09-03 NOTE — Progress Notes (Signed)
Subjective:    Patient ID: Sandra [name], female    DOB: 1945-12-12, 68 y.o.   MRN: [id number]  HPI  Pt presents to the clinic today with c/o difficulty hearing out of the right ear. She reports this started yesterday. She denies any associated symptoms. She has not had a trauma to the head or ear. She has not taken anything OTC.  Review of Systems      Past Medical History  Diagnosis Date  . Hypertension   . Hyperlipidemia     Current Outpatient Prescriptions  Medication Sig Dispense Refill  . amLODipine (NORVASC) 5 MG tablet TAKE ONE TABLET BY MOUTH EVERY DAY  90 tablet  1  . aspirin 81 MG tablet Take 81 mg by mouth daily.      . Biotin 5000 MCG CAPS Take 1 capsule by mouth daily.        . Calcium Carbonate (CALCIUM 500 PO) Take 1 tablet by mouth daily.        . hydrochlorothiazide (HYDRODIURIL) 25 MG tablet TAKE ONE TABLET BY MOUTH EVERY DAY  90 tablet  0  . pravastatin (PRAVACHOL) 20 MG tablet Take 1 tablet (20 mg total) by mouth daily.  90 tablet  0  . traMADol (ULTRAM) 50 MG tablet Take 1 tablet (50 mg total) by mouth every 6 (six) hours as needed.  90 tablet  0   No current facility-administered medications for this visit.    Allergies  Allergen Reactions  . Lovastatin Anaphylaxis    Tongue swelling    Family History  Problem Relation Age of Onset  . Cancer Sister 73    breast cancer    History   Social History  . Marital Status: Single    Spouse Name: N/A    Number of Children: N/A  . Years of Education: N/A   Occupational History  . Not on file.   Social History Main Topics  . Smoking status: Never Smoker   . Smokeless tobacco: Not on file  . Alcohol Use: Not on file  . Drug Use: Not on file  . Sexual Activity: Not on file   Other Topics Concern  . Not on file   Social History Narrative   End of life wishes (updated 01/2013)-   Daughter of wishes (Cherlyl Duwayne Heck)      Desires CPR.   Desires life support if reasonable.               Constitutional: Denies fever, malaise, fatigue, headache or abrupt weight changes.  HEENT: Denies eye pain, eye redness, ear pain, ringing in the ears, wax buildup, runny nose, nasal congestion, bloody nose, or sore throat. Neurological: Denies dizziness, difficulty with memory, difficulty with speech or problems with balance and coordination.   No other specific complaints in a complete review of systems (except as listed in HPI above).  Objective:   Physical Exam   BP 120/68  Pulse 77  Temp(Src) 98.3 F (36.8 C) (Oral)  Wt 132 lb 8 oz (60.102 kg)  SpO2 98% Wt Readings from Last 3 Encounters:  09/03/13 132 lb 8 oz (60.102 kg)  01/26/13 133 lb (60.328 kg)  01/15/12 132 lb (59.875 kg)    General: Appears their stated age, well developed, well nourished in NAD. HEENT: Head: normal shape and size; Eyes: sclera white, no icterus, conjunctiva pink, PERRLA and EOMs intact; Ears: Bilateral cerumen impaction; Nose: mucosa pink and moist, septum midline; Throat/Mouth: Teeth present, mucosa pink and moist, no exudate,  lesions or ulcerations noted.  Pulmonary/Chest: Normal effort and positive vesicular breath sounds. No respiratory distress. No wheezes, rales or ronchi noted.    BMET    Component Value Date/Time   NA 141 01/21/2013 0906   K 3.4* 01/21/2013 0906   CL 104 01/21/2013 0906   CO2 26 01/21/2013 0906   GLUCOSE 120* 01/21/2013 0906   BUN 13 01/21/2013 0906   CREATININE 0.9 01/21/2013 0906   CREATININE 1.0 05/09/2010   CALCIUM 9.2 01/21/2013 0906    Lipid Panel     Component Value Date/Time   CHOL 137 01/21/2013 0906   TRIG 39.0 01/21/2013 0906   HDL 59.60 01/21/2013 0906   CHOLHDL 2 01/21/2013 0906   VLDL 7.8 01/21/2013 0906   LDLCALC 70 01/21/2013 0906    CBC    Component Value Date/Time   WBC 3.3* 01/21/2013 0906   RBC 4.33 01/21/2013 0906   HGB 13.1 01/21/2013 0906   HCT 38.8 01/21/2013 0906   PLT 307.0 01/21/2013 0906   MCV 89.7 01/21/2013 0906   MCHC 33.7 01/21/2013  0906   RDW 12.7 01/21/2013 0906   LYMPHSABS 1.3 01/21/2013 0906   MONOABS 0.3 01/21/2013 0906   EOSABS 0.3 01/21/2013 0906   BASOSABS 0.1 01/21/2013 0906    Hgb A1C Lab Results  Component Value Date   HGBA1C 5.0 01/26/2013        Assessment & Plan:   Hearing loss secondary to bilateral cerumen impaction:  Ears lavaged by CMA Advised pt to get OTC debrox and use twice weekly  RTC as needed or if symptoms persist or worsen

## 2013-09-03 NOTE — Progress Notes (Signed)
Pre-visit discussion using our clinic review tool. No additional management support is needed unless otherwise documented below in the visit note.  

## 2013-09-03 NOTE — Patient Instructions (Signed)
Cerumen Impaction A cerumen impaction is when the wax in your ear forms a plug. This plug usually causes reduced hearing. Sometimes it also causes an earache or dizziness. Removing a cerumen impaction can be difficult and painful. The wax sticks to the ear canal. The canal is sensitive and bleeds easily. If you try to remove a heavy wax buildup with a cotton tipped swab, you may push it in further. Irrigation with water, suction, and small ear curettes may be used to clear out the wax. If the impaction is fixed to the skin in the ear canal, ear drops may be needed for a few days to loosen the wax. People who build up a lot of wax frequently can use ear wax removal products available in your local drugstore. SEEK MEDICAL CARE IF:  You develop an earache, increased hearing loss, or marked dizziness. Document Released: 08/29/2004 Document Revised: 10/14/2011 Document Reviewed: 10/19/2009 [employer] Patient Information 2014 [employer].  

## 2013-11-02 ENCOUNTER — Telehealth: Payer: Self-pay | Admitting: Family Medicine

## 2013-11-02 MED ORDER — HYDROCHLOROTHIAZIDE 25 MG PO TABS: ORAL_TABLET | ORAL | Status: DC

## 2013-11-02 MED ORDER — PRAVASTATIN SODIUM 20 MG PO TABS: 20.0000 mg | ORAL_TABLET | Freq: Every day | ORAL | Status: DC

## 2013-11-02 NOTE — Telephone Encounter (Signed)
Pt is needing refill on Hydrochlorthiazide adn prevastatin. Pt is completely out of meds. Pt uses [facility] on [address].

## 2013-11-02 NOTE — Telephone Encounter (Signed)
Per Dr Deborra Medina, ok to refill ONE additional time. Pt must attend upcoming 04/06 appt for any additional refills.

## 2013-11-08 ENCOUNTER — Ambulatory Visit: Payer: Medicare PPO | Admitting: Family Medicine

## 2013-11-10 ENCOUNTER — Ambulatory Visit (INDEPENDENT_AMBULATORY_CARE_PROVIDER_SITE_OTHER): Payer: Medicare PPO | Admitting: Family Medicine

## 2013-11-10 ENCOUNTER — Telehealth: Payer: Self-pay | Admitting: Family Medicine

## 2013-11-10 ENCOUNTER — Encounter: Payer: Self-pay | Admitting: Family Medicine

## 2013-11-10 VITALS — BP 116/60 | HR 69 | Temp 97.7°F | Ht 61.5 in | Wt 134.8 lb

## 2013-11-10 DIAGNOSIS — I10 Essential (primary) hypertension: Secondary | ICD-10-CM

## 2013-11-10 DIAGNOSIS — E785 Hyperlipidemia, unspecified: Secondary | ICD-10-CM

## 2013-11-10 DIAGNOSIS — Z0000 Encounter for general adult medical examination without abnormal findings: Secondary | ICD-10-CM

## 2013-11-10 LAB — COMPREHENSIVE METABOLIC PANEL
ALK PHOS: 43 U/L (ref 39–117)
ALT: 14 U/L (ref 0–35)
AST: 20 U/L (ref 0–37)
Albumin: 3.9 g/dL (ref 3.5–5.2)
BUN: 13 mg/dL (ref 6–23)
CO2: 27 meq/L (ref 19–32)
CREATININE: 0.9 mg/dL (ref 0.4–1.2)
Calcium: 9 mg/dL (ref 8.4–10.5)
Chloride: 101 mEq/L (ref 96–112)
GFR: 85.34 mL/min (ref >60.00)
Glucose, Bld: 95 mg/dL (ref 70–99)
Potassium: 3 mEq/L — ABNORMAL LOW (ref 3.5–5.1)
Sodium: 138 mEq/L (ref 135–145)
Total Bilirubin: 0.7 mg/dL (ref 0.3–1.2)
Total Protein: 6.9 g/dL (ref 6.0–8.3)

## 2013-11-10 LAB — CBC WITH DIFFERENTIAL/PLATELET
BASOS ABS: 0 10*3/uL (ref 0.0–0.1)
Basophils Relative: 1.1 % (ref 0.0–3.0)
Eosinophils Absolute: 0.2 10*3/uL (ref 0.0–0.7)
Eosinophils Relative: 5.8 % — ABNORMAL HIGH (ref 0.0–5.0)
HEMATOCRIT: 37.9 % (ref 36.0–46.0)
Hemoglobin: 12.7 g/dL (ref 12.0–15.0)
LYMPHS ABS: 1.1 10*3/uL (ref 0.7–4.0)
Lymphocytes Relative: 39.6 % (ref 12.0–46.0)
MCHC: 33.5 g/dL (ref 30.0–36.0)
MCV: 88.5 fl (ref 78.0–100.0)
MONO ABS: 0.2 10*3/uL (ref 0.1–1.0)
MONOS PCT: 8.3 % (ref 3.0–12.0)
NEUTROS ABS: 1.3 10*3/uL — AB (ref 1.4–7.7)
Neutrophils Relative %: 45.2 % (ref 43.0–77.0)
Platelets: 318 10*3/uL (ref 150.0–400.0)
RBC: 4.28 Mil/uL (ref 3.87–5.11)
RDW: 13.2 % (ref 11.5–14.6)
WBC: 2.8 10*3/uL — ABNORMAL LOW (ref 4.5–10.5)

## 2013-11-10 LAB — LIPID PANEL
Cholesterol: 137 mg/dL (ref 0–200)
HDL: 61.2 mg/dL (ref >39.00)
LDL Cholesterol: 69 mg/dL (ref 0–99)
Total CHOL/HDL Ratio: 2
Triglycerides: 36 mg/dL (ref 0.0–149.0)
VLDL: 7.2 mg/dL (ref 0.0–40.0)

## 2013-11-10 LAB — TSH: TSH: 1.14 u[IU]/mL (ref 0.35–5.50)

## 2013-11-10 MED ORDER — HYDROCHLOROTHIAZIDE 25 MG PO TABS: ORAL_TABLET | ORAL | Status: DC

## 2013-11-10 MED ORDER — PRAVASTATIN SODIUM 20 MG PO TABS: 20.0000 mg | ORAL_TABLET | Freq: Every day | ORAL | Status: DC

## 2013-11-10 NOTE — Progress Notes (Signed)
Pre visit review using our clinic review tool, if applicable. No additional management support is needed unless otherwise documented below in the visit note. 

## 2013-11-10 NOTE — Assessment & Plan Note (Signed)
Due for labs today. Orders Placed This Encounter  Procedures  . CBC with Differential  . Comprehensive metabolic panel  . Lipid panel  . TSH

## 2013-11-10 NOTE — Assessment & Plan Note (Signed)
Well controlled.   No changes. eRx sent.

## 2013-11-10 NOTE — Telephone Encounter (Signed)
Relevant patient education assigned to patient using Emmi. ° °

## 2013-11-10 NOTE — Progress Notes (Signed)
68 yo pleasant female here for med refills.  Has appt for CPX in August.   HTN- taking amlodipine 5 mg and HCTZ 25 mg daily.  Still walks twice a day.  Denies any CP, SOB or LE edema. No blurred vision. Lab Results  Component Value Date   CREATININE 0.9 01/21/2013    HLD- on Pravastatin 20 mg.  Denies any myalgias.   Lab Results  Component Value Date   CHOL 137 01/21/2013   HDL 59.60 01/21/2013   LDLCALC 70 01/21/2013   TRIG 39.0 01/21/2013   CHOLHDL 2 01/21/2013    Appetite good.  Weight stable. Wt Readings from Last 3 Encounters:  11/10/13 134 lb 12 oz (61.122 kg)  09/03/13 132 lb 8 oz (60.102 kg)  01/26/13 133 lb (60.328 kg)     Patient Active Problem List   Diagnosis Date Noted  . Hyperpigmentation 01/26/2013  . Hyperglycemia 01/26/2013  . Lipoma 01/15/2012  . Hair loss 01/14/2011  . Hypertension   . Hyperlipidemia    Past Medical History  Diagnosis Date  . Hypertension   . Hyperlipidemia    Past Surgical History  Procedure Laterality Date  . Abdominal hysterectomy     History  Substance Use Topics  . Smoking status: Never Smoker   . Smokeless tobacco: Not on file  . Alcohol Use: Not on file   Family History  Problem Relation Age of Onset  . Cancer Sister 28    breast cancer   Allergies  Allergen Reactions  . Lovastatin Anaphylaxis    Tongue swelling   Current Outpatient Prescriptions on File Prior to Visit  Medication Sig Dispense Refill  . amLODipine (NORVASC) 5 MG tablet TAKE ONE TABLET BY MOUTH EVERY DAY  90 tablet  1  . aspirin 81 MG tablet Take 81 mg by mouth daily.      . Biotin 5000 MCG CAPS Take 1 capsule by mouth daily.        . Calcium Carbonate (CALCIUM 500 PO) Take 1 tablet by mouth daily.        . traMADol (ULTRAM) 50 MG tablet Take 1 tablet (50 mg total) by mouth every 6 (six) hours as needed.  90 tablet  0   No current facility-administered medications on file prior to visit.     The PMH, PSH, Social History, Family  History, Medications, and allergies have been reviewed in [facility], and have been updated if relevant.  ROS: See HPI No chest pain No LE edema No leg cramps  No DOE  Physical exam: BP 116/60  Pulse 69  Temp(Src) 97.7 F (36.5 C) (Oral)  Ht 5' 1.5" (1.562 m)  Wt 134 lb 12 oz (61.122 kg)  BMI 25.05 kg/m2  SpO2 98%   Wt Readings from Last 3 Encounters:  11/10/13 134 lb 12 oz (61.122 kg)  09/03/13 132 lb 8 oz (60.102 kg)  01/26/13 133 lb (60.328 kg)    General:  Well-developed,well-nourished,in no acute distress; alert,appropriate and cooperative throughout examination Head:  normocephalic and atraumatic.   Eyes:  vision grossly intact, pupils equal, pupils round, and pupils reactive to light.   Ears:  R ear normal and L ear normal.   Nose:  no external deformity.   Mouth:  good dentition.   Neck:  No deformities, masses, or tenderness noted. Lungs:  Normal respiratory effort, chest expands symmetrically. Lungs are clear to auscultation, no crackles or wheezes. Heart:  Normal rate and regular rhythm. S1 and S2 normal without gallop, murmur,  click, rub or other extra sounds. Abdomen:  Bowel sounds positive,abdomen soft and non-tender without masses, organomegaly or hernias noted. Msk:  No deformity or scoliosis noted of thoracic or lumbar spine.   Extremities:  No clubbing, cyanosis, edema, or deformity noted with normal full range of motion of all joints.   Neurologic:  alert & oriented X3 and gait normal.   Psych:  Cognition and judgment appear intact. Alert and cooperative with normal attention span and concentration. No apparent delusions, illusions, hallucinations Assessment and Plan:

## 2013-11-10 NOTE — Patient Instructions (Signed)
Great to see you. I will call you with your lab results from today .   

## 2013-11-11 ENCOUNTER — Other Ambulatory Visit: Payer: Self-pay | Admitting: Family Medicine

## 2013-11-11 DIAGNOSIS — D72819 Decreased white blood cell count, unspecified: Secondary | ICD-10-CM

## 2013-11-24 ENCOUNTER — Ambulatory Visit: Payer: Self-pay | Admitting: Internal Medicine

## 2013-11-24 LAB — CBC CANCER CENTER
Bands: 1 %
Basophil #: 0 x10 3/mm (ref 0.0–0.1)
Basophil %: 1.2 %
Basophil: 1 %
Comment - H1-Com1: NORMAL
Eosinophil #: 0.1 x10 3/mm (ref 0.0–0.7)
Eosinophil %: 3.6 %
Eosinophil: 4 %
HCT: 39.1 % (ref 35.0–47.0)
HGB: 13.1 g/dL (ref 12.0–16.0)
Lymphocyte #: 1.1 x10 3/mm (ref 1.0–3.6)
Lymphocyte %: 36.2 %
Lymphocytes: 32 %
MCH: 29.7 pg (ref 26.0–34.0)
MCHC: 33.5 g/dL (ref 32.0–36.0)
MCV: 89 fL (ref 80–100)
Monocyte #: 0.2 x10 3/mm (ref 0.2–0.9)
Monocyte %: 6.6 %
Monocytes: 5 %
NEUTROS ABS: 1.6 x10 3/mm (ref 1.4–6.5)
Neutrophil %: 52.4 %
PLATELETS: 319 x10 3/mm (ref 150–440)
RBC: 4.41 10*6/uL (ref 3.80–5.20)
RDW: 12.9 % (ref 11.5–14.5)
Segmented Neutrophils: 55 %
VARIANT LYMPHOCYTE - H4-RLYMPH: 2 %
WBC: 3.1 x10 3/mm — AB (ref 3.6–11.0)

## 2013-11-24 LAB — PROTIME-INR
INR: 1
PROTHROMBIN TIME: 12.7 s (ref 11.5–14.7)

## 2013-11-24 LAB — IRON AND TIBC
IRON: 113 ug/dL (ref 50–170)
Iron Bind.Cap.(Total): 378 ug/dL (ref 250–450)
Iron Saturation: 30 %
UNBOUND IRON-BIND. CAP.: 265 ug/dL

## 2013-11-24 LAB — LACTATE DEHYDROGENASE: LDH: 175 U/L (ref 81–246)

## 2013-11-24 LAB — RETICULOCYTES
ABSOLUTE RETIC COUNT: 0.0608 10*6/uL (ref 0.019–0.186)
Reticulocyte: 1.37 % (ref 0.4–3.1)

## 2013-11-24 LAB — APTT: ACTIVATED PTT: 27.9 s (ref 23.6–35.9)

## 2013-11-24 LAB — FOLATE: Folic Acid: 16.6 ng/mL (ref 3.1–100.0)

## 2013-11-25 LAB — URINE IEP, RANDOM

## 2013-11-26 LAB — PROT IMMUNOELECTROPHORES(ARMC)

## 2013-12-03 ENCOUNTER — Ambulatory Visit: Payer: Self-pay | Admitting: Internal Medicine

## 2013-12-20 ENCOUNTER — Ambulatory Visit (INDEPENDENT_AMBULATORY_CARE_PROVIDER_SITE_OTHER): Payer: Medicare PPO | Admitting: Family Medicine

## 2013-12-20 ENCOUNTER — Telehealth: Payer: Self-pay | Admitting: *Deleted

## 2013-12-20 ENCOUNTER — Encounter: Payer: Self-pay | Admitting: Family Medicine

## 2013-12-20 VITALS — BP 126/70 | HR 71 | Temp 98.0°F | Wt 131.2 lb

## 2013-12-20 DIAGNOSIS — R5381 Other malaise: Secondary | ICD-10-CM

## 2013-12-20 DIAGNOSIS — R55 Syncope and collapse: Secondary | ICD-10-CM | POA: Insufficient documentation

## 2013-12-20 DIAGNOSIS — R5383 Other fatigue: Secondary | ICD-10-CM

## 2013-12-20 LAB — COMPREHENSIVE METABOLIC PANEL
ALT: 19 U/L (ref 0–35)
AST: 24 U/L (ref 0–37)
Albumin: 4.5 g/dL (ref 3.5–5.2)
Alkaline Phosphatase: 45 U/L (ref 39–117)
BILIRUBIN TOTAL: 1.1 mg/dL (ref 0.2–1.2)
BUN: 16 mg/dL (ref 6–23)
CO2: 29 meq/L (ref 19–32)
Calcium: 9.5 mg/dL (ref 8.4–10.5)
Chloride: 99 mEq/L (ref 96–112)
Creatinine, Ser: 1.1 mg/dL (ref 0.4–1.2)
GFR: 66.85 mL/min (ref >60.00)
GLUCOSE: 99 mg/dL (ref 70–99)
Potassium: 3.2 mEq/L — ABNORMAL LOW (ref 3.5–5.1)
Sodium: 138 mEq/L (ref 135–145)
Total Protein: 7.8 g/dL (ref 6.0–8.3)

## 2013-12-20 MED ORDER — TRAMADOL HCL 50 MG PO TABS: 50.0000 mg | ORAL_TABLET | Freq: Four times a day (QID) | ORAL | Status: DC | PRN

## 2013-12-20 NOTE — Telephone Encounter (Signed)
Spoke to [place] at [facility] and she will fax over [place] notes

## 2013-12-20 NOTE — Patient Instructions (Signed)
Great to see you. I will call you with your lab results.  Go home and rest, drink plenty of fluids. If you develop any new symptoms, call us or go to ER immediately.  Vasovagal Syncope, Adult Syncope, commonly known as fainting, is a temporary loss of consciousness. It occurs when the blood flow to the brain is reduced. Vasovagal syncope (also called neurocardiogenic syncope) is a fainting spell in which the blood flow to the brain is reduced because of a sudden drop in heart rate and blood pressure. Vasovagal syncope occurs when the brain and the cardiovascular system (blood vessels) do not adequately communicate and respond to each other. This is the most common cause of fainting. It often occurs in response to fear or some other type of emotional or physical stress. The body has a reaction in which the heart starts beating too slowly or the blood vessels expand, reducing blood pressure. This type of fainting spell is generally considered harmless. However, injuries can occur if a person takes a sudden fall during a fainting spell.  CAUSES  Vasovagal syncope occurs when a person's blood pressure and heart rate decrease suddenly, usually in response to a trigger. Many things and situations can trigger an episode. Some of these include:   Pain.   Fear.   The sight of blood or medical procedures, such as blood being drawn from a vein.   Common activities, such as coughing, swallowing, stretching, or going to the bathroom.   Emotional stress.   Prolonged standing, especially in a warm environment.   Lack of sleep or rest.   Prolonged lack of food.   Prolonged lack of fluids.   Recent illness.  The use of certain drugs that affect blood pressure, such as cocaine, alcohol, marijuana, inhalants, and opiates.  SYMPTOMS  Before the fainting episode, you may:   Feel dizzy or light headed.   Become pale.  Sense that you are going to faint.   Feel like the room is spinning.    Have tunnel vision, only seeing directly in front of you.   Feel sick to your stomach (nauseous).   See spots or slowly lose vision.   Hear ringing in your ears.   Have a headache.   Feel warm and sweaty.   Feel a sensation of pins and needles. During the fainting spell, you will generally be unconscious for no longer than a couple minutes before waking up and returning to normal. If you get up too quickly before your body can recover, you may faint again. Some twitching or jerky movements may occur during the fainting spell.  DIAGNOSIS  Your caregiver will ask about your symptoms, take a medical history, and perform a physical exam. Various tests may be done to rule out other causes of fainting. These may include blood tests and tests to check the heart, such as electrocardiography, echocardiography, and possibly an electrophysiology study. When other causes have been ruled out, a test may be done to check the body's response to changes in position (tilt table test). TREATMENT  Most cases of vasovagal syncope do not require treatment. Your caregiver may recommend ways to avoid fainting triggers and may provide home strategies for preventing fainting. If you must be exposed to a possible trigger, you can drink additional fluids to help reduce your chances of having an episode of vasovagal syncope. If you have warning signs of an oncoming episode, you can respond by positioning yourself favorably (lying down). If your fainting spells continue, you  may be given medicines to prevent fainting. Some medicines may help make you more resistant to repeated episodes of vasovagal syncope. Special exercises or compression stockings may be recommended. In rare cases, the surgical placement of a pacemaker is considered. HOME CARE INSTRUCTIONS   Learn to identify the warning signs of vasovagal syncope.   Sit or lie down at the first warning sign of a fainting spell. If sitting, put your head  down between your legs. If you lie down, swing your legs up in the air to increase blood flow to the brain.   Avoid hot tubs and saunas.  Avoid prolonged standing.  Drink enough fluids to keep your urine clear or pale yellow. Avoid caffeine.  Increase salt in your diet as directed by your caregiver.   If you have to stand for a long time, perform movements such as:   Crossing your legs.   Flexing and stretching your leg muscles.   Squatting.   Moving your legs.   Bending over.   Only take over-the-counter or prescription medicines as directed by your caregiver. Do not suddenly stop any medicines without asking your caregiver first. SEEK MEDICAL CARE IF:   Your fainting spells continue or happen more frequently in spite of treatment.   You lose consciousness for more than a couple minutes.  You have fainting spells during or after exercising or after being startled.   You have new symptoms that occur with the fainting spells, such as:   Shortness of breath.  Chest pain.   Irregular heartbeat.   You have episodes of twitching or jerky movements that last longer than a few seconds.  You have episodes of twitching or jerky movements without obvious fainting. SEEK IMMEDIATE MEDICAL CARE IF:   You have injuries or bleeding after a fainting spell.   You have episodes of twitching or jerky movements that last longer than 5 minutes.   You have more than one spell of twitching or jerky movements before returning to consciousness after fainting. MAKE SURE YOU:   Understand these instructions.  Will watch your condition.  Will get help right away if you are not doing well or get worse. Document Released: 07/08/2012 Document Reviewed: 07/08/2012 [facility] Patient Information 2014 [place], [place].

## 2013-12-20 NOTE — Progress Notes (Signed)
Pre visit review using our clinic review tool, if applicable. No additional management support is needed unless otherwise documented below in the visit note. 

## 2013-12-20 NOTE — Telephone Encounter (Signed)
Message copied by Modena Nunnery on Mon Dec 20, 2013 11:19 AM ------      Message from: Lucille Passy      Created: Mon Dec 20, 2013 10:35 AM       Please call [facility] to get copy of her office note.      Thanks,      Tanja Port ------

## 2013-12-20 NOTE — Assessment & Plan Note (Addendum)
Likely vasovagal based on history. EKG reassuring- NSR. Other than fatigue that is improving, she is asymptomatic.   Will check electrolytes and kidney function today since she is on BP medication. Not orthostatic today. The patient indicates understanding of these issues and agrees with the plan.

## 2013-12-20 NOTE — Addendum Note (Signed)
Addended by: Lucille Passy on: 12/20/2013 10:57 AM   Modules accepted: Orders

## 2013-12-20 NOTE — Progress Notes (Addendum)
   Subjective:   Patient ID: Sandra [name], female    DOB: 1946/03/08, 68 y.o.   MRN: [id number]  Sandra [name] is a pleasant 68 y.o. year old female who presents to clinic today with Fatigue, Anorexia and Loss of Consciousness  on 12/20/2013  HPI: After cutting grass two days ago, felt fine.  No CP or SOB for 3 hours while cutting grass.  Went inside and took a shower.  Passed out after she got out of the shower. She had not eaten much that day, was a hot day. She does think she hit her head on vanity.  Hit right side of her face.  It was unwittnesed.  Did not go to the ER.    Did not have any CP or SOB afterward.  Since this episode, has been very fatigued.  Getting better every day.  She also had decreased appetite since the episode. No HA, no blurred vision.   Has never had a syncopal episode.  Has had recent lab work. Very physically active.  Walks 6 miles per day.  Never has any dizziness or DOE when exercising.  I did recently send to her hematologist for leukopenia.  We have not received these records.  Per pt, was told it was benign.   CBC    Component Value Date/Time   WBC 2.8* 11/10/2013 0922   RBC 4.28 11/10/2013 0922   HGB 12.7 11/10/2013 0922   HCT 37.9 11/10/2013 0922   PLT 318.0 11/10/2013 0922   MCV 88.5 11/10/2013 0922   MCHC 33.5 11/10/2013 0922   RDW 13.2 11/10/2013 0922   LYMPHSABS 1.1 11/10/2013 0922   MONOABS 0.2 11/10/2013 0922   EOSABS 0.2 11/10/2013 0922   BASOSABS 0.0 11/10/2013 0347   Lab Results  Component Value Date   TSH 1.14 11/10/2013     Lab Results  Component Value Date   CREATININE 0.9 11/10/2013      Review of Systems See HPI Did not have any presyncopal dizziness, nausea or vomiting or diaphoresis    Objective:    BP 126/70  Pulse 71  Temp(Src) 98 F (36.7 C) (Oral)  Wt 131 lb 4 oz (59.535 kg)  SpO2 98%   Physical Exam  Gen:  Alert, pleasant, NAD HEENT:  echymosis lateral to right eye PERRL Resp:  CTA bilaterally CVS:  RRR Neuro:   Normal gait CN II- XII grossly intact r      Assessment & Plan:   Syncopal episodes  Other malaise and fatigue No Follow-up on file.

## 2014-01-03 ENCOUNTER — Ambulatory Visit: Payer: Self-pay | Admitting: Internal Medicine

## 2014-01-19 ENCOUNTER — Other Ambulatory Visit: Payer: Self-pay | Admitting: *Deleted

## 2014-01-19 MED ORDER — AMLODIPINE BESYLATE 5 MG PO TABS: ORAL_TABLET | ORAL | Status: DC

## 2014-01-28 ENCOUNTER — Ambulatory Visit (INDEPENDENT_AMBULATORY_CARE_PROVIDER_SITE_OTHER): Payer: Medicare PPO | Admitting: Internal Medicine

## 2014-01-28 ENCOUNTER — Encounter: Payer: Self-pay | Admitting: Internal Medicine

## 2014-01-28 VITALS — BP 132/66 | HR 80 | Temp 98.4°F | Wt 135.5 lb

## 2014-01-28 DIAGNOSIS — S80862A Insect bite (nonvenomous), left lower leg, initial encounter: Secondary | ICD-10-CM

## 2014-01-28 DIAGNOSIS — S90569A Insect bite (nonvenomous), unspecified ankle, initial encounter: Secondary | ICD-10-CM

## 2014-01-28 DIAGNOSIS — W57XXXA Bitten or stung by nonvenomous insect and other nonvenomous arthropods, initial encounter: Principal | ICD-10-CM

## 2014-01-28 NOTE — Patient Instructions (Addendum)

## 2014-01-28 NOTE — Progress Notes (Signed)
Pre visit review using our clinic review tool, if applicable. No additional management support is needed unless otherwise documented below in the visit note. 

## 2014-01-28 NOTE — Progress Notes (Signed)
Subjective:    Patient ID: Sandra [name], female    DOB: [birth year], 68 y.o.   MRN: [id number]  HPI  Pt presents to the clinic today with c/o a place on her thigh that she would like to get checked. This started 2 days ago. She thinks it may be a bug bite. It is very itchy. She has had some redness in the area. She denies drainage, fever, chills or body aches. She has been putting blue star ointment on it with good relief.  Review of Systems      Past Medical History  Diagnosis Date  . Hypertension   . Hyperlipidemia     Current Outpatient Prescriptions  Medication Sig Dispense Refill  . amLODipine (NORVASC) 5 MG tablet TAKE ONE TABLET BY MOUTH EVERY DAY  90 tablet  0  . aspirin 81 MG tablet Take 81 mg by mouth daily.      . Biotin 5000 MCG CAPS Take 1 capsule by mouth daily.        . Calcium Carbonate (CALCIUM 500 PO) Take 1 tablet by mouth daily.        . hydrochlorothiazide (HYDRODIURIL) 25 MG tablet TAKE ONE TABLET BY MOUTH EVERY DAY  90 tablet  1  . pravastatin (PRAVACHOL) 20 MG tablet Take 1 tablet (20 mg total) by mouth daily.  90 tablet  1  . traMADol (ULTRAM) 50 MG tablet Take 1 tablet (50 mg total) by mouth every 6 (six) hours as needed.  90 tablet  0   No current facility-administered medications for this visit.    Allergies  Allergen Reactions  . Lovastatin Anaphylaxis    Tongue swelling    Family History  Problem Relation Age of Onset  . Cancer Sister 33    breast cancer    History   Social History  . Marital Status: Single    Spouse Name: N/A    Number of Children: N/A  . Years of Education: N/A   Occupational History  . Not on file.   Social History Main Topics  . Smoking status: Never Smoker   . Smokeless tobacco: Not on file  . Alcohol Use: Not on file  . Drug Use: Not on file  . Sexual Activity: Not on file   Other Topics Concern  . Not on file   Social History Narrative   End of life wishes (updated 01/2013)-   Daughter of wishes  (Cherlyl Duwayne Heck)      Desires CPR.   Desires life support if reasonable.              Constitutional: Denies fever, malaise, fatigue, headache or abrupt weight changes.  Skin: Pt reports area on left thigh. Denies rashes, lesions or ulcercations.    No other specific complaints in a complete review of systems (except as listed in HPI above).  Objective:   Physical Exam  BP 132/66  Pulse 80  Temp(Src) 98.4 F (36.9 C) (Oral)  Wt 135 lb 8 oz (61.462 kg)  SpO2 97% Wt Readings from Last 3 Encounters:  01/28/14 135 lb 8 oz (61.462 kg)  12/20/13 131 lb 4 oz (59.535 kg)  11/10/13 134 lb 12 oz (61.122 kg)    General: Appears her stated age, well developed, well nourished in NAD. Skin: Warm, dry and intact. Bug bite noted on left thigh, no evidence of cellulitis. Cardiovascular: Normal rate and rhythm. S1,S2 noted.  No murmur, rubs or gallops noted. No JVD or BLE edema. No carotid  bruits noted. Pulmonary/Chest: Normal effort and positive vesicular breath sounds. No respiratory distress. No wheezes, rales or ronchi noted.      BMET    Component Value Date/Time   NA 138 12/20/2013 1059   K 3.2* 12/20/2013 1059   CL 99 12/20/2013 1059   CO2 29 12/20/2013 1059   GLUCOSE 99 12/20/2013 1059   BUN 16 12/20/2013 1059   CREATININE 1.1 12/20/2013 1059   CREATININE 1.0 05/09/2010   CALCIUM 9.5 12/20/2013 1059    Lipid Panel     Component Value Date/Time   CHOL 137 11/10/2013 0922   TRIG 36.0 11/10/2013 0922   HDL 61.20 11/10/2013 0922   CHOLHDL 2 11/10/2013 0922   VLDL 7.2 11/10/2013 0922   LDLCALC 69 11/10/2013 0922    CBC    Component Value Date/Time   WBC 2.8* 11/10/2013 0922   RBC 4.28 11/10/2013 0922   HGB 12.7 11/10/2013 0922   HCT 37.9 11/10/2013 0922   PLT 318.0 11/10/2013 0922   MCV 88.5 11/10/2013 0922   MCHC 33.5 11/10/2013 0922   RDW 13.2 11/10/2013 0922   LYMPHSABS 1.1 11/10/2013 0922   MONOABS 0.2 11/10/2013 0922   EOSABS 0.2 11/10/2013 0922   BASOSABS 0.0 11/10/2013 0922    Hgb  A1C Lab Results  Component Value Date   HGBA1C 5.0 01/26/2013         Assessment & Plan:   Bug bite of left thigh:  No indication of infection Continue to monitor at this time Ok to apply benadryl cream if it itches  RTC as needed or if symptoms persist or worsen

## 2014-03-23 ENCOUNTER — Other Ambulatory Visit: Payer: Self-pay | Admitting: Family Medicine

## 2014-03-23 DIAGNOSIS — R739 Hyperglycemia, unspecified: Secondary | ICD-10-CM

## 2014-03-23 DIAGNOSIS — E785 Hyperlipidemia, unspecified: Secondary | ICD-10-CM

## 2014-03-23 DIAGNOSIS — Z0000 Encounter for general adult medical examination without abnormal findings: Secondary | ICD-10-CM

## 2014-03-23 DIAGNOSIS — I10 Essential (primary) hypertension: Secondary | ICD-10-CM

## 2014-03-28 ENCOUNTER — Other Ambulatory Visit (INDEPENDENT_AMBULATORY_CARE_PROVIDER_SITE_OTHER): Payer: Medicare PPO

## 2014-03-28 DIAGNOSIS — R739 Hyperglycemia, unspecified: Secondary | ICD-10-CM

## 2014-03-28 DIAGNOSIS — R7309 Other abnormal glucose: Secondary | ICD-10-CM

## 2014-03-28 DIAGNOSIS — Z0000 Encounter for general adult medical examination without abnormal findings: Secondary | ICD-10-CM

## 2014-03-28 DIAGNOSIS — E785 Hyperlipidemia, unspecified: Secondary | ICD-10-CM

## 2014-03-28 DIAGNOSIS — I10 Essential (primary) hypertension: Secondary | ICD-10-CM

## 2014-03-28 LAB — LIPID PANEL
Cholesterol: 132 mg/dL (ref 0–200)
HDL: 63.8 mg/dL
LDL Cholesterol: 61 mg/dL (ref 0–99)
NonHDL: 68.2
Total CHOL/HDL Ratio: 2
Triglycerides: 35 mg/dL (ref 0.0–149.0)
VLDL: 7 mg/dL (ref 0.0–40.0)

## 2014-03-28 LAB — COMPREHENSIVE METABOLIC PANEL
ALK PHOS: 43 U/L (ref 39–117)
ALT: 19 U/L (ref 0–35)
AST: 23 U/L (ref 0–37)
Albumin: 3.7 g/dL (ref 3.5–5.2)
BUN: 13 mg/dL (ref 6–23)
CALCIUM: 8.7 mg/dL (ref 8.4–10.5)
CHLORIDE: 102 meq/L (ref 96–112)
CO2: 29 mEq/L (ref 19–32)
Creatinine, Ser: 0.9 mg/dL (ref 0.4–1.2)
GFR: 77.8 mL/min (ref >60.00)
Glucose, Bld: 107 mg/dL — ABNORMAL HIGH (ref 70–99)
POTASSIUM: 3.2 meq/L — AB (ref 3.5–5.1)
Sodium: 138 mEq/L (ref 135–145)
Total Bilirubin: 0.4 mg/dL (ref 0.2–1.2)
Total Protein: 6.9 g/dL (ref 6.0–8.3)

## 2014-03-28 LAB — HEMOGLOBIN A1C: Hgb A1c MFr Bld: 5.1 % (ref 4.6–6.5)

## 2014-03-28 LAB — CBC WITH DIFFERENTIAL/PLATELET
Basophils Absolute: 0 10*3/uL (ref 0.0–0.1)
Basophils Relative: 0.8 % (ref 0.0–3.0)
Eosinophils Absolute: 0.2 10*3/uL (ref 0.0–0.7)
Eosinophils Relative: 6.4 % — ABNORMAL HIGH (ref 0.0–5.0)
HCT: 39 % (ref 36.0–46.0)
Hemoglobin: 13 g/dL (ref 12.0–15.0)
LYMPHS PCT: 39.3 % (ref 12.0–46.0)
Lymphs Abs: 1.3 10*3/uL (ref 0.7–4.0)
MCHC: 33.2 g/dL (ref 30.0–36.0)
MCV: 88.4 fl (ref 78.0–100.0)
Monocytes Absolute: 0.3 10*3/uL (ref 0.1–1.0)
Monocytes Relative: 8.1 % (ref 3.0–12.0)
NEUTROS PCT: 45.4 % (ref 43.0–77.0)
Neutro Abs: 1.5 10*3/uL (ref 1.4–7.7)
PLATELETS: 339 10*3/uL (ref 150.0–400.0)
RBC: 4.42 Mil/uL (ref 3.87–5.11)
RDW: 13 % (ref 11.5–15.5)
WBC: 3.4 10*3/uL — ABNORMAL LOW (ref 4.0–10.5)

## 2014-04-04 ENCOUNTER — Encounter: Payer: Medicare PPO | Admitting: Family Medicine

## 2014-04-06 ENCOUNTER — Ambulatory Visit (INDEPENDENT_AMBULATORY_CARE_PROVIDER_SITE_OTHER): Payer: Medicare PPO | Admitting: Family Medicine

## 2014-04-06 ENCOUNTER — Encounter: Payer: Self-pay | Admitting: Family Medicine

## 2014-04-06 ENCOUNTER — Encounter: Payer: Self-pay | Admitting: Internal Medicine

## 2014-04-06 VITALS — BP 114/74 | HR 79 | Temp 98.0°F | Ht 62.0 in | Wt 134.0 lb

## 2014-04-06 DIAGNOSIS — E785 Hyperlipidemia, unspecified: Secondary | ICD-10-CM

## 2014-04-06 DIAGNOSIS — I10 Essential (primary) hypertension: Secondary | ICD-10-CM

## 2014-04-06 DIAGNOSIS — Z23 Encounter for immunization: Secondary | ICD-10-CM

## 2014-04-06 DIAGNOSIS — D72819 Decreased white blood cell count, unspecified: Secondary | ICD-10-CM

## 2014-04-06 DIAGNOSIS — Z0000 Encounter for general adult medical examination without abnormal findings: Secondary | ICD-10-CM

## 2014-04-06 MED ORDER — AMLODIPINE BESYLATE 5 MG PO TABS: ORAL_TABLET | ORAL | Status: DC

## 2014-04-06 MED ORDER — HYDROCHLOROTHIAZIDE 25 MG PO TABS: ORAL_TABLET | ORAL | Status: DC

## 2014-04-06 MED ORDER — PRAVASTATIN SODIUM 20 MG PO TABS: 20.0000 mg | ORAL_TABLET | Freq: Every day | ORAL | Status: DC

## 2014-04-06 MED ORDER — PRAVASTATIN SODIUM 10 MG PO TABS: 10.0000 mg | ORAL_TABLET | Freq: Every day | ORAL | Status: DC

## 2014-04-06 NOTE — Addendum Note (Signed)
Addended by: Modena Nunnery on: 04/06/2014 09:44 AM   Modules accepted: Orders

## 2014-04-06 NOTE — Patient Instructions (Addendum)
Please decrease your pravastatin to 10 mg daily- ok to cut 20 mg tabs in half but I will send in a new prescription. Please come back in 8 weeks for labs.  Please call to set up your mammogram and colonoscopy.  Wonderful to see you and keep up the great work.

## 2014-04-06 NOTE — Progress Notes (Signed)
Pre visit review using our clinic review tool, if applicable. No additional management support is needed unless otherwise documented below in the visit note. 

## 2014-04-06 NOTE — Progress Notes (Signed)
68 yo pleasant female here for AMW visit.  I have personally reviewed the Medicare Annual Wellness questionnaire and have noted 1. The patient's medical and social history- see below 2. Their use of alcohol, tobacco or illicit drugs- no use of drugs 3. Their current medications and supplements- see med list 4. The patient's functional ability including ADL's, fall risks, home safety risks and hearing or visual             Impairment.- independent for all ADLs, AIDLs. 5. Diet and physical activities- very physically active 6. Evidence for depression or mood disorders- denies any signs or symptoms of depression.  End of life wishes discussed and updated in Social History.  The roster of all physicians providing medical care to patient - is listed in the Snapshot section of the chart.  Working part time at [employer]- she enjoys it.  Mammogram 03/09/2013 Zoster 01/15/12 Pneumovax 01/15/12 Pap smear 01/29/13- remote h/o hysterectomy- should not need any more pap smears. Bone density normal in 04/2012.  Referred to GI  for colonoscopy in 2013- was not time yet for colonoscopy.  Due this year.  Progressive Leukopenia- referred her to hematology, Dr. Ma Hillock 4 months ago.  Note reviewed from 01/2014- advised follow up in 2 weeks but she cancelled appointment because "he can't do anything about it."  HTN- now on HCTZ 25 mg daily only.  Denies any CP, SOB or LE edema. No blurred vision. Lab Results  Component Value Date   CREATININE 0.9 03/28/2014    HLD- on Pravastatin 20 mg.  Denies any myalgias.   Lab Results  Component Value Date   CHOL 132 03/28/2014   HDL 63.80 03/28/2014   LDLCALC 61 03/28/2014   TRIG 35.0 03/28/2014   CHOLHDL 2 03/28/2014    Lab Results  Component Value Date   ALT 19 03/28/2014   AST 23 03/28/2014   ALKPHOS 43 03/28/2014   BILITOT 0.4 03/28/2014      Appetite good.  Weight stable. Wt Readings from Last 3 Encounters:  04/06/14 134 lb (60.782 kg)  01/28/14 135 lb  8 oz (61.462 kg)  12/20/13 131 lb 4 oz (59.535 kg)     Patient Active Problem List   Diagnosis Date Noted  . Medicare annual wellness visit, subsequent 04/06/2014  . Syncope 12/20/2013  . Hyperpigmentation 01/26/2013  . Lipoma 01/15/2012  . Hair loss 01/14/2011  . Hypertension   . Hyperlipidemia    Past Medical History  Diagnosis Date  . Hypertension   . Hyperlipidemia    Past Surgical History  Procedure Laterality Date  . Abdominal hysterectomy     History  Substance Use Topics  . Smoking status: Never Smoker   . Smokeless tobacco: Not on file  . Alcohol Use: No   Family History  Problem Relation Age of Onset  . Cancer Sister 69    breast cancer   Allergies  Allergen Reactions  . Lovastatin Anaphylaxis    Tongue swelling   Current Outpatient Prescriptions on File Prior to Visit  Medication Sig Dispense Refill  . aspirin 81 MG tablet Take 81 mg by mouth daily.      . Biotin 5000 MCG CAPS Take 1 capsule by mouth daily.        . Calcium Carbonate (CALCIUM 500 PO) Take 1 tablet by mouth daily.        . traMADol (ULTRAM) 50 MG tablet Take 1 tablet (50 mg total) by mouth every 6 (six) hours as needed.  90 tablet  0   No current facility-administered medications on file prior to visit.     The PMH, PSH, Social History, Family History, Medications, and allergies have been reviewed in [facility], and have been updated if relevant.  ROS: See HPI Patient reports no  vision/ hearing changes,anorexia, weight change, fever ,adenopathy, persistant / recurrent hoarseness, swallowing issues, chest pain, edema,persistant / recurrent cough, hemoptysis, dyspnea(rest, exertional, paroxysmal nocturnal), gastrointestinal  bleeding (melena, rectal bleeding), abdominal pain, excessive heart burn, GU symptoms(dysuria, hematuria, pyuria, voiding/incontinence  Issues) syncope, focal weakness, severe memory loss, concerning skin lesions, depression, anxiety, abnormal bruising/bleeding, major  joint swelling, breast masses or abnormal vaginal bleeding.    Physical exam: BP 114/74  Pulse 79  Temp(Src) 98 F (36.7 C) (Oral)  Ht 5\' 2"  (1.575 m)  Wt 134 lb (60.782 kg)  BMI 24.50 kg/m2  SpO2 97%   Wt Readings from Last 3 Encounters:  04/06/14 134 lb (60.782 kg)  01/28/14 135 lb 8 oz (61.462 kg)  12/20/13 131 lb 4 oz (59.535 kg)    General:  Well-developed,well-nourished,in no acute distress; alert,appropriate and cooperative throughout examination Head:  normocephalic and atraumatic.   Eyes:  vision grossly intact, pupils equal, pupils round, and pupils reactive to light.   Ears:  R ear normal and L ear normal.   Nose:  no external deformity.   Mouth:  good dentition.   Neck:  No deformities, masses, or tenderness noted. Breast:  No masses or nipple discharge No TTP Lungs:  Normal respiratory effort, chest expands symmetrically. Lungs are clear to auscultation, no crackles or wheezes. Heart:  Normal rate and regular rhythm. S1 and S2 normal without gallop, murmur, click, rub or other extra sounds. Abdomen:  Bowel sounds positive,abdomen soft and non-tender without masses, organomegaly or hernias noted. Msk:  No deformity or scoliosis noted of thoracic or lumbar spine.   Extremities:  No clubbing, cyanosis, edema, or deformity noted with normal full range of motion of all joints.   Neurologic:  alert & oriented X3 and gait normal.   Skin:   No lymphadenopathy noted Axillary Nodes:  No palpable lymphadenopathy Psych:  Cognition and judgment appear intact. Alert and cooperative with normal attention span and concentration. No apparent delusions, illusions, hallucinations

## 2014-04-06 NOTE — Assessment & Plan Note (Signed)
Well controlled. Will decrease dose of Pravachol to 10 mg daily. CMET and lipid panel in 8 weeks.

## 2014-04-06 NOTE — Assessment & Plan Note (Signed)
Well controlled on current rx. Advised to increase dietary potassium.

## 2014-04-06 NOTE — Assessment & Plan Note (Addendum)
The patients weight, height, BMI and visual acuity have been recorded in the chart I have made referrals, counseling and provided education to the patient based review of the above and I have provided the pt with a written personalized care plan for preventive services.  Prevnar 13 and influenza vaccinations today. She will call to set up mammogram, DEXA and colonoscopy.

## 2014-04-06 NOTE — Assessment & Plan Note (Signed)
Stable. Advised follow up with Dr. Ma Hillock- will request records from him as well.

## 2014-04-14 ENCOUNTER — Telehealth: Payer: Self-pay | Admitting: Family Medicine

## 2014-04-14 NOTE — Telephone Encounter (Signed)
Patient Information:  Caller Name: [place]  Phone: [phone]  Patient: Sandra, [name]  Gender: Female  DOB: 1946/08/01  Age: 68 Years  PCP: Arnette Norris [facility]  Office Follow Up:  Does the office need to follow up with this patient?: No  Instructions For The Office: N/A   Symptoms  Reason For Call & Symptoms: Patient repors she received flu immunization in right arm on 04/06/14.  Arm remains mildly red after shower estimated larger than softball.  Denies swelling or puffiness.  Mild itching. Denies pain.  Emergent symptoms ruled out.  Home  Reviewed Health History In EMR: Yes  Reviewed Medications In EMR: Yes  Reviewed Allergies In EMR: Yes  Reviewed Surgeries / Procedures: Yes  Date of Onset of Symptoms: 04/06/2014  Treatments Tried: Cool compress with some relief  Treatments Tried Worked: Yes  Guideline(s) Used:  Puncture Wound  Disposition Per Guideline:   Home Care  Reason For Disposition Reached:   Minor puncture wound  Advice Given:  Pain Medicines:  For pain relief, you can take either acetaminophen, ibuprofen, or naproxen.  They are over-the-counter (OTC) pain drugs. You can buy them at the drugstore.  Call Back If:  It begins to look infected (redness, red streaks, tenderness, pus, fever)  Pain becomes severe  You become worse.  Patient Will Follow Care Advice:  YES

## 2014-05-04 ENCOUNTER — Ambulatory Visit: Payer: Self-pay | Admitting: Family Medicine

## 2014-05-05 ENCOUNTER — Encounter: Payer: Self-pay | Admitting: Family Medicine

## 2014-05-24 ENCOUNTER — Ambulatory Visit: Payer: Self-pay | Admitting: Internal Medicine

## 2014-05-24 LAB — CBC CANCER CENTER
Basophil #: 0.1 x10 3/mm (ref 0.0–0.1)
Basophil %: 2.7 %
Eosinophil #: 0.1 x10 3/mm (ref 0.0–0.7)
Eosinophil %: 4.7 %
HCT: 40.6 % (ref 35.0–47.0)
HGB: 13.1 g/dL (ref 12.0–16.0)
Lymphocyte #: 1 x10 3/mm (ref 1.0–3.6)
Lymphocyte %: 41.5 %
MCH: 29 pg (ref 26.0–34.0)
MCHC: 32.4 g/dL (ref 32.0–36.0)
MCV: 90 fL (ref 80–100)
Monocyte #: 0.2 x10 3/mm (ref 0.2–0.9)
Monocyte %: 7.5 %
NEUTROS PCT: 43.6 %
Neutrophil #: 1.1 x10 3/mm — ABNORMAL LOW (ref 1.4–6.5)
PLATELETS: 309 x10 3/mm (ref 150–440)
RBC: 4.53 10*6/uL (ref 3.80–5.20)
RDW: 12.9 % (ref 11.5–14.5)
WBC: 2.5 x10 3/mm — AB (ref 3.6–11.0)

## 2014-05-27 ENCOUNTER — Other Ambulatory Visit: Payer: Self-pay | Admitting: Family Medicine

## 2014-05-27 DIAGNOSIS — E785 Hyperlipidemia, unspecified: Secondary | ICD-10-CM

## 2014-06-02 ENCOUNTER — Encounter: Payer: Self-pay | Admitting: *Deleted

## 2014-06-02 ENCOUNTER — Other Ambulatory Visit (INDEPENDENT_AMBULATORY_CARE_PROVIDER_SITE_OTHER): Payer: Medicare PPO

## 2014-06-02 DIAGNOSIS — E785 Hyperlipidemia, unspecified: Secondary | ICD-10-CM

## 2014-06-02 LAB — LIPID PANEL
CHOLESTEROL: 139 mg/dL (ref 0–200)
HDL: 65.9 mg/dL (ref >39.00)
LDL Cholesterol: 64 mg/dL (ref 0–99)
NONHDL: 73.1
Total CHOL/HDL Ratio: 2
Triglycerides: 44 mg/dL (ref 0.0–149.0)
VLDL: 8.8 mg/dL (ref 0.0–40.0)

## 2014-06-02 LAB — COMPREHENSIVE METABOLIC PANEL
ALBUMIN: 3.4 g/dL — AB (ref 3.5–5.2)
ALT: 16 U/L (ref 0–35)
AST: 20 U/L (ref 0–37)
Alkaline Phosphatase: 45 U/L (ref 39–117)
BILIRUBIN TOTAL: 1.2 mg/dL (ref 0.2–1.2)
BUN: 15 mg/dL (ref 6–23)
CALCIUM: 8.8 mg/dL (ref 8.4–10.5)
CO2: 24 meq/L (ref 19–32)
Chloride: 103 mEq/L (ref 96–112)
Creatinine, Ser: 1 mg/dL (ref 0.4–1.2)
GFR: 72.29 mL/min (ref >60.00)
GLUCOSE: 105 mg/dL — AB (ref 70–99)
POTASSIUM: 3.3 meq/L — AB (ref 3.5–5.1)
Sodium: 140 mEq/L (ref 135–145)
TOTAL PROTEIN: 6.8 g/dL (ref 6.0–8.3)

## 2014-06-05 ENCOUNTER — Ambulatory Visit: Payer: Self-pay | Admitting: Internal Medicine

## 2014-06-07 ENCOUNTER — Ambulatory Visit (AMBULATORY_SURGERY_CENTER): Payer: Self-pay | Admitting: *Deleted

## 2014-06-07 VITALS — Ht 62.0 in | Wt 134.4 lb

## 2014-06-07 DIAGNOSIS — Z1211 Encounter for screening for malignant neoplasm of colon: Secondary | ICD-10-CM

## 2014-06-07 MED ORDER — MOVIPREP 100 G PO SOLR: ORAL | Status: DC

## 2014-06-07 NOTE — Progress Notes (Signed)
No allergies to eggs or soy. No problems with anesthesia.  Pt given Emmi instructions for colonoscopy  No oxygen use  No diet drug use  

## 2014-06-09 ENCOUNTER — Encounter: Payer: Self-pay | Admitting: *Deleted

## 2014-06-21 ENCOUNTER — Encounter: Payer: Self-pay | Admitting: Internal Medicine

## 2014-06-21 ENCOUNTER — Ambulatory Visit (AMBULATORY_SURGERY_CENTER): Payer: Medicare PPO | Admitting: Internal Medicine

## 2014-06-21 VITALS — BP 123/72 | HR 66 | Temp 97.4°F | Resp 20 | Ht 62.0 in | Wt 134.0 lb

## 2014-06-21 DIAGNOSIS — K635 Polyp of colon: Secondary | ICD-10-CM

## 2014-06-21 DIAGNOSIS — D124 Benign neoplasm of descending colon: Secondary | ICD-10-CM

## 2014-06-21 DIAGNOSIS — Z1211 Encounter for screening for malignant neoplasm of colon: Secondary | ICD-10-CM

## 2014-06-21 MED ORDER — SODIUM CHLORIDE 0.9 % IV SOLN: 500.0000 mL | INTRAVENOUS | Status: DC

## 2014-06-21 NOTE — Op Note (Signed)
[place]  [address] [place], [id number]   COLONOSCOPY PROCEDURE REPORT  PATIENT: Sandra [name], Sandra [name]  MR#: [id number] BIRTHDATE: 01/30/1946 , 87  yrs. old GENDER: female ENDOSCOPIST: Jerene Bears, MD REFERRED GE:ZMOQH Aron, M.D. PROCEDURE DATE:  06/21/2014 PROCEDURE:   Colonoscopy with snare polypectomy First Screening Colonoscopy - Avg.  risk and is 50 yrs.  old or older - No.  Prior Negative Screening - Now for repeat screening. 10 or more years since last screening  History of Adenoma - Now for follow-up colonoscopy & has been > or = to 3 yrs.  N/A  Polyps Removed Today? Yes. ASA CLASS:   Class II INDICATIONS:average risk for colorectal cancer, last colonoscopy > 10 yrs ago. MEDICATIONS: Monitored anesthesia care and Propofol 200 mg IV  DESCRIPTION OF PROCEDURE:   After the risks benefits and alternatives of the procedure were thoroughly explained, informed consent was obtained.  The digital rectal exam revealed no rectal mass.   The LB [id number]  endoscope was introduced through the anus and advanced to the terminal ileum which was intubated for a short distance. No adverse events experienced.   The quality of the prep was good, using MoviPrep  The instrument was then slowly withdrawn as the colon was fully examined.   COLON FINDINGS: Normal terminal ileum in the examined segments.  A semi-pedunculated polyp measuring 8 mm in size was found in the descending colon.  A polypectomy was performed using snare cautery. The resection was complete, the polyp tissue was completely retrieved and sent to histology.   There was mild diverticulosis noted in the sigmoid colon.  Retroflexed views revealed internal hemorrhoids. The time to cecum=4 minutes 04 seconds.  Withdrawal time=9 minutes 21 seconds.  The scope was withdrawn and the procedure completed. COMPLICATIONS: There were no immediate complications.  ENDOSCOPIC IMPRESSION: 1.   Semi-pedunculated  polyp was found in the descending colon; polypectomy was performed using snare cautery 2.   Mild diverticulosis was noted in the sigmoid colon  RECOMMENDATIONS: 1.  Await pathology results 2.  High fiber diet 3.  Timing of repeat colonoscopy will be determined by pathology findings. 4.  You will receive a letter within 1-2 weeks with the results of your biopsy as well as final recommendations.  Please call my office if you have not received a letter after 3 weeks.  eSigned:  Jerene Bears, MD 06/21/2014 12:01 PM   cc: The Patient and Arnette Norris MD

## 2014-06-21 NOTE — Progress Notes (Signed)
Called to room to assist during endoscopic procedure.  Patient ID and intended procedure confirmed with present staff. Received instructions for my participation in the procedure from the performing physician.  

## 2014-06-21 NOTE — Patient Instructions (Signed)

## 2014-06-21 NOTE — Progress Notes (Signed)
Report to PACU, RN, vss, BBS= Clear.  

## 2014-06-22 ENCOUNTER — Telehealth: Payer: Self-pay | Admitting: *Deleted

## 2014-06-22 NOTE — Telephone Encounter (Signed)
  Follow up Call-  Call back number 06/21/2014  Post procedure Call Back phone  # [phone]  Permission to leave phone message Yes     Patient questions:  Do you have a fever, pain , or abdominal swelling? No. Pain Score  0 *  Have you tolerated food without any problems? Yes.    Have you been able to return to your normal activities? Yes.    Do you have any questions about your discharge instructions: Diet   No. Medications  No. Follow up visit  No.  Do you have questions or concerns about your Care? No.  Actions: * If pain score is 4 or above: No action needed, pain <4.

## 2014-06-28 ENCOUNTER — Encounter: Payer: Self-pay | Admitting: Internal Medicine

## 2014-07-20 ENCOUNTER — Other Ambulatory Visit: Payer: Self-pay | Admitting: *Deleted

## 2014-07-20 MED ORDER — AMLODIPINE BESYLATE 5 MG PO TABS: ORAL_TABLET | ORAL | Status: DC

## 2014-07-20 NOTE — Telephone Encounter (Signed)
Pt left note requesting refill amlodipine; left v/m to pt that med already refilled as requested from [address]. [facility] faxed refill request.

## 2014-08-05 DIAGNOSIS — Z9221 Personal history of antineoplastic chemotherapy: Secondary | ICD-10-CM

## 2014-08-05 DIAGNOSIS — Z923 Personal history of irradiation: Secondary | ICD-10-CM

## 2014-08-05 HISTORY — DX: Personal history of antineoplastic chemotherapy: Z92.21

## 2014-08-05 HISTORY — PX: BREAST EXCISIONAL BIOPSY: SUR124

## 2014-08-05 HISTORY — DX: Personal history of irradiation: Z92.3

## 2014-08-10 ENCOUNTER — Encounter: Payer: Self-pay | Admitting: Family Medicine

## 2014-08-10 ENCOUNTER — Ambulatory Visit (INDEPENDENT_AMBULATORY_CARE_PROVIDER_SITE_OTHER): Payer: Medicare PPO | Admitting: Family Medicine

## 2014-08-10 ENCOUNTER — Telehealth: Payer: Self-pay | Admitting: Family Medicine

## 2014-08-10 VITALS — BP 120/70 | HR 66 | Temp 97.8°F | Wt 134.0 lb

## 2014-08-10 DIAGNOSIS — L811 Chloasma: Secondary | ICD-10-CM

## 2014-08-10 DIAGNOSIS — R768 Other specified abnormal immunological findings in serum: Secondary | ICD-10-CM | POA: Insufficient documentation

## 2014-08-10 DIAGNOSIS — D709 Neutropenia, unspecified: Secondary | ICD-10-CM | POA: Insufficient documentation

## 2014-08-10 DIAGNOSIS — I10 Essential (primary) hypertension: Secondary | ICD-10-CM

## 2014-08-10 MED ORDER — HYDROCHLOROTHIAZIDE 25 MG PO TABS: ORAL_TABLET | ORAL | Status: DC

## 2014-08-10 MED ORDER — PRAVASTATIN SODIUM 10 MG PO TABS: 10.0000 mg | ORAL_TABLET | Freq: Every day | ORAL | Status: DC

## 2014-08-10 MED ORDER — FLUOCIN-HYDROQUINONE-TRETINOIN 0.01-4-0.05 % EX CREA: TOPICAL_CREAM | CUTANEOUS | Status: DC

## 2014-08-10 MED ORDER — AMLODIPINE BESYLATE 5 MG PO TABS: ORAL_TABLET | ORAL | Status: DC

## 2014-08-10 NOTE — Assessment & Plan Note (Signed)
Well controlled on current rx. No changes made- eRx sent for 1 year.

## 2014-08-10 NOTE — Assessment & Plan Note (Signed)
Asymptomatic currently. eRx refilled today as well but discussed proper usage of cream. The patient indicates understanding of these issues and agrees with the plan.

## 2014-08-10 NOTE — Assessment & Plan Note (Signed)
Rheumatology notes reviewed- I will call pt to discuss further. She was unaware that there is concern that she may be developing a connective tissue disorder.

## 2014-08-10 NOTE — Progress Notes (Signed)
Pre visit review using our clinic review tool, if applicable. No additional management support is needed unless otherwise documented below in the visit note. 

## 2014-08-10 NOTE — Progress Notes (Signed)
Subjective:   Patient ID: Sandra [name], female    DOB: Dec 20, 1945, 69 y.o.   MRN: [id number]  Sandra [name] is a pleasant 69 y.o. year old female who presents to clinic today with Follow-up  on 08/10/2014  HPI: Saw her for medicare wellness visit in 04/2014.  Since then, Dr. Hilarie Fredrickson did her colonoscopy in 06/21/14 - reviewed- polyps.  Also was referred by her hematologist, Dr. Ma Hillock who she sees for neutropenia, to [facility] rheumatology for pos ANA.  Patient is confused about what she is seeing rhematology for.  Note reviewed from 07/05/14- + FANA 1-640 with mildly elevated SSB and normal SSA- advised following for possible emergent Sjrogrens/connective disease disease.  No stigmata or other indication to start plaquenil or other rx at this time.  Melasma- she is being treated by derm with topical rx (tretinoin)- she is asking for refill of this today.  Does not use often- has been over 6 month since she has used it.  HTN- now on HCTZ 25 mg daily only.  Denies any CP, SOB or LE edema. No blurred vision. Lab Results  Component Value Date   CREATININE 0.9 03/28/2014    HLD- on Pravastatin 20 mg. Denies any myalgias.   Lab Results  Component Value Date   CHOL 132 03/28/2014   HDL 63.80 03/28/2014   LDLCALC 61 03/28/2014   TRIG 35.0 03/28/2014   CHOLHDL 2 03/28/2014    Current Outpatient Prescriptions on File Prior to Visit  Medication Sig Dispense Refill  . aspirin 81 MG tablet Take 81 mg by mouth daily.    . Calcium Carbonate (CALCIUM 500 PO) Take 1 tablet by mouth daily.      . traMADol (ULTRAM) 50 MG tablet Take 1 tablet (50 mg total) by mouth every 6 (six) hours as needed. 90 tablet 0   No current facility-administered medications on file prior to visit.    Allergies  Allergen Reactions  . Lovastatin Anaphylaxis    Tongue swelling    Past Medical History  Diagnosis Date  . Hypertension   . Hyperlipidemia     Past  Surgical History  Procedure Laterality Date  . Abdominal hysterectomy  1987  . Fatty tumor Left 2013    side  . Bunionectomy Right 1993    Family History  Problem Relation Age of Onset  . Cancer Sister 51    breast cancer  . Colon cancer Neg Hx     History   Social History  . Marital Status: Divorced    Spouse Name: N/A    Number of Children: N/A  . Years of Education: N/A   Occupational History  . Not on file.   Social History Main Topics  . Smoking status: Never Smoker   . Smokeless tobacco: Never Used  . Alcohol Use: No  . Drug Use: No  . Sexual Activity: Not on file   Other Topics Concern  . Not on file   Social History Narrative   End of life wishes (updated 01/2013)-   Daughter of wishes (Sandra [name])      Desires CPR.   Desires life support if reasonable.            The PMH, PSH, Social History, Family History, Medications, and allergies have been reviewed in [facility], and have been updated if relevant.   Review of Systems  Constitutional: Negative.   HENT: Negative.   Respiratory: Negative.   Cardiovascular: Negative.   Gastrointestinal: Negative.  Endocrine: Negative.   Genitourinary: Negative.   Musculoskeletal: Negative.   Skin: Positive for color change.  Neurological: Negative.   Hematological: Negative.   Psychiatric/Behavioral: Negative.   All other systems reviewed and are negative.      Objective:    BP 120/70 mmHg  Pulse 66  Temp(Src) 97.8 F (36.6 C) (Tympanic)  Wt 134 lb (60.782 kg)  SpO2 99%   Physical Exam  Constitutional: She is oriented to person, place, and time. She appears well-developed and well-nourished. No distress.  HENT:  Head: Normocephalic.  Eyes: Conjunctivae are normal.  Neck: Normal range of motion.  Cardiovascular: Normal rate.   Pulmonary/Chest: Effort normal.  Musculoskeletal: Normal range of motion.  Neurological: She is alert and oriented to person, place, and time. No cranial nerve  deficit.  Skin: Skin is warm and dry.  Psychiatric: She has a normal mood and affect. Her behavior is normal. Judgment and thought content normal.  Nursing note and vitals reviewed.         Assessment & Plan:   Essential hypertension  Melasma  Neutropenia  Positive ANA (antinuclear antibody) No Follow-up on file.

## 2014-08-10 NOTE — Assessment & Plan Note (Signed)
Followed by Dr. Ma Hillock.  She has a lot of questions for me today about why she needs to keep seeing him every 3 months if nothing can be done.  Explained to her that I am not a hematologist but I can certainly monitor her CBC every 3 months here but I may need to send her back to hematology. The patient indicates understanding of these issues and agrees with the plan.

## 2014-08-10 NOTE — Telephone Encounter (Signed)
Patient returned your call.

## 2014-11-08 ENCOUNTER — Ambulatory Visit
Admit: 2014-11-08 | Disposition: A | Discharge status: Home / Self Care | Payer: Self-pay | Attending: Internal Medicine | Admitting: Internal Medicine

## 2014-11-08 DIAGNOSIS — D72819 Decreased white blood cell count, unspecified: Secondary | ICD-10-CM | POA: Diagnosis not present

## 2014-11-08 LAB — CBC CANCER CENTER
BASOS ABS: 0 x10 3/mm (ref 0.0–0.1)
BASOS PCT: 0.2 %
EOS ABS: 0.2 x10 3/mm (ref 0.0–0.7)
Eosinophil %: 5 %
HCT: 39.8 % (ref 35.0–47.0)
HGB: 13.4 g/dL (ref 12.0–16.0)
Lymphocyte #: 1.3 x10 3/mm (ref 1.0–3.6)
Lymphocyte %: 38.7 %
MCH: 29.5 pg (ref 26.0–34.0)
MCHC: 33.6 g/dL (ref 32.0–36.0)
MCV: 88 fL (ref 80–100)
MONO ABS: 0.3 x10 3/mm (ref 0.2–0.9)
Monocyte %: 8.8 %
NEUTROS ABS: 1.6 x10 3/mm (ref 1.4–6.5)
NEUTROS PCT: 47.3 %
Platelet: 318 x10 3/mm (ref 150–440)
RBC: 4.54 10*6/uL (ref 3.80–5.20)
RDW: 12.7 % (ref 11.5–14.5)
WBC: 3.4 x10 3/mm — ABNORMAL LOW (ref 3.6–11.0)

## 2014-11-22 NOTE — Op Note (Signed)
PATIENT NAME:  Sandra [name], Sandra [name] MR#:  [id number] DATE OF BIRTH:  1945/08/28  DATE OF PROCEDURE:  02/24/2012  PREOPERATIVE DIAGNOSIS: Soft tissue mass, left lateral abdominal wall flank area.   POSTOPERATIVE DIAGNOSIS: Large lipoma located subfascial, left lateral abdominal wall.   OPERATION PERFORMED: Excision of soft tissue mass in left lateral abdominal wall area   SURGEON: Mckinley Jewel, M.D.   ANESTHESIA: Local with sedation; 0.5%  Marcaine mixed with 1% Xylocaine.  COMPLICATIONS: None.   ESTIMATED BLOOD LOSS: Approximately 20 mL.   DRAINS: None.   DESCRIPTION OF PROCEDURE: The patient was placed in a bean bag with slight rotation to the right exposing the mass located in the left lateral abdominal wall encroaching the flank area. With adequate IV sedation monitoring, this area was prepped and draped out as a sterile field. A local anesthetic was then instilled along the planned incision overlying this mass and all around the edges of the mass to obtain an adequate block. A 4 cm skin incision was made and deepened through the subcutaneous tissue down to the deep fascia which was then opened to reveal the presence of the mass, which was actually a lipoma situated on top of the musculature. It did not appear to infiltrate into the muscle. This was circumferentially freed and removed. Bleeding was controlled with cautery. The mass measured approximately 6 cm in diameter. After ensuring         hemostasis, the deeper tissue was closed with 2-0 Vicryl, and the subcutaneous tissue with 3-0 Vicryl, and the skin closed with subcuticular 4-0 Vicryl covered with Dermabond. The procedure was well tolerated. She was subsequently returned to the recovery room in stable condition. ____________________________ S.Robinette Haines, MD sgs:slb D: 02/25/2012 06:52:42 ET T: 02/25/2012 10:29:14 ET JOB#: [id number]  cc: Synthia Innocent. Jamal Collin, MD, <Dictator> [facility] Robinette Haines MD ELECTRONICALLY SIGNED 02/26/2012 6:31

## 2015-01-10 DIAGNOSIS — R768 Other specified abnormal immunological findings in serum: Secondary | ICD-10-CM | POA: Diagnosis not present

## 2015-04-05 ENCOUNTER — Other Ambulatory Visit (INDEPENDENT_AMBULATORY_CARE_PROVIDER_SITE_OTHER): Payer: Medicare PPO

## 2015-04-05 ENCOUNTER — Other Ambulatory Visit: Payer: Self-pay | Admitting: Family Medicine

## 2015-04-05 DIAGNOSIS — Z0000 Encounter for general adult medical examination without abnormal findings: Secondary | ICD-10-CM

## 2015-04-05 DIAGNOSIS — Z01419 Encounter for gynecological examination (general) (routine) without abnormal findings: Secondary | ICD-10-CM

## 2015-04-05 DIAGNOSIS — E785 Hyperlipidemia, unspecified: Secondary | ICD-10-CM | POA: Diagnosis not present

## 2015-04-05 LAB — COMPREHENSIVE METABOLIC PANEL
ALK PHOS: 43 U/L (ref 39–117)
ALT: 14 U/L (ref 0–35)
AST: 20 U/L (ref 0–37)
Albumin: 3.9 g/dL (ref 3.5–5.2)
BILIRUBIN TOTAL: 0.7 mg/dL (ref 0.2–1.2)
BUN: 13 mg/dL (ref 6–23)
CALCIUM: 9 mg/dL (ref 8.4–10.5)
CO2: 31 meq/L (ref 19–32)
CREATININE: 0.93 mg/dL (ref 0.40–1.20)
Chloride: 103 mEq/L (ref 96–112)
GFR: 76.61 mL/min (ref >60.00)
Glucose, Bld: 108 mg/dL — ABNORMAL HIGH (ref 70–99)
Potassium: 3.4 mEq/L — ABNORMAL LOW (ref 3.5–5.1)
Sodium: 141 mEq/L (ref 135–145)
TOTAL PROTEIN: 6.7 g/dL (ref 6.0–8.3)

## 2015-04-05 LAB — LIPID PANEL
CHOL/HDL RATIO: 2
Cholesterol: 138 mg/dL (ref 0–200)
HDL: 59.7 mg/dL (ref >39.00)
LDL Cholesterol: 72 mg/dL (ref 0–99)
NONHDL: 78.46
TRIGLYCERIDES: 31 mg/dL (ref 0.0–149.0)
VLDL: 6.2 mg/dL (ref 0.0–40.0)

## 2015-04-05 LAB — CBC WITH DIFFERENTIAL/PLATELET
BASOS ABS: 0 10*3/uL (ref 0.0–0.1)
Basophils Relative: 0.7 % (ref 0.0–3.0)
EOS ABS: 0.2 10*3/uL (ref 0.0–0.7)
Eosinophils Relative: 6.4 % — ABNORMAL HIGH (ref 0.0–5.0)
HEMATOCRIT: 37.5 % (ref 36.0–46.0)
HEMOGLOBIN: 12.6 g/dL (ref 12.0–15.0)
LYMPHS PCT: 42.1 % (ref 12.0–46.0)
Lymphs Abs: 1.3 10*3/uL (ref 0.7–4.0)
MCHC: 33.7 g/dL (ref 30.0–36.0)
MCV: 88.3 fl (ref 78.0–100.0)
MONOS PCT: 8 % (ref 3.0–12.0)
Monocytes Absolute: 0.2 10*3/uL (ref 0.1–1.0)
Neutro Abs: 1.3 10*3/uL — ABNORMAL LOW (ref 1.4–7.7)
Neutrophils Relative %: 42.8 % — ABNORMAL LOW (ref 43.0–77.0)
PLATELETS: 326 10*3/uL (ref 150.0–400.0)
RBC: 4.24 Mil/uL (ref 3.87–5.11)
RDW: 12.7 % (ref 11.5–15.5)
WBC: 3 10*3/uL — AB (ref 4.0–10.5)

## 2015-04-05 LAB — TSH: TSH: 2.48 u[IU]/mL (ref 0.35–4.50)

## 2015-04-06 LAB — HEPATITIS C ANTIBODY: HCV AB: NEGATIVE

## 2015-04-12 ENCOUNTER — Encounter: Payer: Self-pay | Admitting: Family Medicine

## 2015-04-12 ENCOUNTER — Ambulatory Visit (INDEPENDENT_AMBULATORY_CARE_PROVIDER_SITE_OTHER): Payer: Medicare PPO | Admitting: Family Medicine

## 2015-04-12 ENCOUNTER — Other Ambulatory Visit (HOSPITAL_COMMUNITY)
Admission: RE | Admit: 2015-04-12 | Discharge: 2015-04-12 | Disposition: A | Discharge status: Home / Self Care | Payer: Medicare PPO | Source: Ambulatory Visit | Attending: Family Medicine | Admitting: Family Medicine

## 2015-04-12 VITALS — BP 102/60 | HR 83 | Temp 98.0°F | Ht 61.5 in | Wt 133.5 lb

## 2015-04-12 DIAGNOSIS — Z113 Encounter for screening for infections with a predominantly sexual mode of transmission: Secondary | ICD-10-CM | POA: Diagnosis not present

## 2015-04-12 DIAGNOSIS — I10 Essential (primary) hypertension: Secondary | ICD-10-CM

## 2015-04-12 DIAGNOSIS — R768 Other specified abnormal immunological findings in serum: Secondary | ICD-10-CM

## 2015-04-12 DIAGNOSIS — Z0000 Encounter for general adult medical examination without abnormal findings: Secondary | ICD-10-CM

## 2015-04-12 DIAGNOSIS — E785 Hyperlipidemia, unspecified: Secondary | ICD-10-CM

## 2015-04-12 DIAGNOSIS — Z01419 Encounter for gynecological examination (general) (routine) without abnormal findings: Secondary | ICD-10-CM | POA: Diagnosis not present

## 2015-04-12 DIAGNOSIS — N760 Acute vaginitis: Secondary | ICD-10-CM | POA: Insufficient documentation

## 2015-04-12 DIAGNOSIS — Z124 Encounter for screening for malignant neoplasm of cervix: Secondary | ICD-10-CM | POA: Diagnosis not present

## 2015-04-12 DIAGNOSIS — D72819 Decreased white blood cell count, unspecified: Secondary | ICD-10-CM | POA: Diagnosis not present

## 2015-04-12 DIAGNOSIS — R7689 Other specified abnormal immunological findings in serum: Secondary | ICD-10-CM

## 2015-04-12 NOTE — Assessment & Plan Note (Signed)
Well controlled on current rx. No changes made today. 

## 2015-04-12 NOTE — Progress Notes (Signed)
Pre visit review using our clinic review tool, if applicable. No additional management support is needed unless otherwise documented below in the visit note. 

## 2015-04-12 NOTE — Progress Notes (Signed)
Subjective:   Patient ID: Sandra [name], female    DOB: [birth year], 69 y.o.   MRN: [id number]  Sandra [name] is a pleasant 69 y.o. year old female who presents to clinic today with Annual Exam  and follow up of chronic medical conditions on 04/12/2015  HPI:  I have personally reviewed the Medicare Annual Wellness questionnaire and have noted 1. The patient's medical and social history 2. Their use of alcohol, tobacco or illicit drugs 3. Their current medications and supplements 4. The patient's functional ability including ADL's, fall risks, home safety risks and hearing or visual             impairment. 5. Diet and physical activities 6. Evidence for depression or mood disorders  End of life wishes discussed and updated in Social History.  The roster of all physicians providing medical care to patient - is listed in the CareTeams section of the chart.   colonoscopy in 06/21/14 -Dr. Hilarie Fredrickson- reviewed- polyps Remote h/o hysterectomy Zostavax 01/15/12 Mammogram 05/04/14 Pneumovax 01/15/12 Prevnar 13 04/06/14  Doing well.  Getting married next month.  Last year, was referred by her hematologist, Dr. Ma Hillock who she sees for neutropenia, to [facility] rheumatology for pos ANA.  Patient is confused about what she is seeing rhematology for.  Note reviewed from 01/10/15   Melasma- she is being treated by derm with topical rx (tretinoin)- she is asking for refill of this today.  Does not use often- has been over 6 month since she has used it.  HTN- now on HCTZ 25 mg daily only.  Denies any CP, SOB or LE edema. No blurred vision. Lab Results  Component Value Date   CREATININE 0.93 04/05/2015     HLD- on Pravastatin 20 mg. Denies any myalgias.   Lab Results  Component Value Date   CHOL 138 04/05/2015   HDL 59.70 04/05/2015   LDLCALC 72 04/05/2015   TRIG 31.0 04/05/2015   CHOLHDL 2 04/05/2015   Lab Results  Component Value Date   ALT 14 04/05/2015   AST 20  04/05/2015   ALKPHOS 43 04/05/2015   BILITOT 0.7 04/05/2015   Lab Results  Component Value Date   WBC 3.0* 04/05/2015   HGB 12.6 04/05/2015   HCT 37.5 04/05/2015   MCV 88.3 04/05/2015   PLT 326.0 04/05/2015   Lab Results  Component Value Date   TSH 2.48 04/05/2015     Current Outpatient Prescriptions on File Prior to Visit  Medication Sig Dispense Refill  . amLODipine (NORVASC) 5 MG tablet TAKE ONE TABLET BY MOUTH EVERY DAY 90 tablet 3  . aspirin 81 MG tablet Take 81 mg by mouth daily.    . Calcium Carbonate (CALCIUM 500 PO) Take 1 tablet by mouth daily.      . Fluocin-Hydroquinone-Tretinoin 0.01-4-0.05 % CREA Use nightly as directed on hyperpigmented area 1 Tube 0  . hydrochlorothiazide (HYDRODIURIL) 25 MG tablet TAKE ONE TABLET BY MOUTH EVERY DAY 90 tablet 3  . pravastatin (PRAVACHOL) 10 MG tablet Take 1 tablet (10 mg total) by mouth daily. 90 tablet 3   No current facility-administered medications on file prior to visit.    Allergies  Allergen Reactions  . Lovastatin Anaphylaxis    Tongue swelling    Past Medical History  Diagnosis Date  . Hypertension   . Hyperlipidemia     Past Surgical History  Procedure Laterality Date  . Abdominal hysterectomy  1987  . Fatty tumor Left 2013  side  . Bunionectomy Right 1993    Family History  Problem Relation Age of Onset  . Cancer Sister 80    breast cancer  . Colon cancer Neg Hx     Social History   Social History  . Marital Status: Divorced    Spouse Name: N/A  . Number of Children: N/A  . Years of Education: N/A   Occupational History  . Not on file.   Social History Main Topics  . Smoking status: Never Smoker   . Smokeless tobacco: Never Used  . Alcohol Use: No  . Drug Use: No  . Sexual Activity: Not on file   Other Topics Concern  . Not on file   Social History Narrative   End of life wishes (updated 01/2013)-   Daughter of wishes (Cherlyl Duwayne Heck)      Desires CPR.   Desires life  support if reasonable.            The PMH, PSH, Social History, Family History, Medications, and allergies have been reviewed in [facility], and have been updated if relevant.   Review of Systems  Constitutional: Negative.   HENT: Negative.   Respiratory: Negative.   Cardiovascular: Negative.   Gastrointestinal: Negative.   Endocrine: Negative.   Genitourinary: Negative.   Musculoskeletal: Negative.   Skin: Positive for color change.  Neurological: Negative.   Hematological: Negative.   Psychiatric/Behavioral: Negative.   All other systems reviewed and are negative.      Objective:    BP 102/60 mmHg  Pulse 83  Temp(Src) 98 F (36.7 C) (Oral)  Ht 5' 1.5" (1.562 m)  Wt 133 lb 8 oz (60.555 kg)  BMI 24.82 kg/m2  SpO2 97%   Physical Exam  Constitutional: She is oriented to person, place, and time. She appears well-developed and well-nourished. No distress.  HENT:  Head: Normocephalic.  Eyes: Conjunctivae are normal.  Neck: Normal range of motion.  Cardiovascular: Normal rate.   Pulmonary/Chest: Effort normal.  Abdominal: Soft. Bowel sounds are normal. She exhibits no distension. There is no tenderness. There is no rebound. Hernia confirmed negative in the right inguinal area and confirmed negative in the left inguinal area.  Genitourinary: Vagina normal. No breast swelling, tenderness, discharge or bleeding. No labial fusion. There is no rash, tenderness, lesion or injury on the right labia. There is no rash, tenderness, lesion or injury on the left labia. Right adnexum displays no mass, no tenderness and no fullness. Left adnexum displays no mass, no tenderness and no fullness.  Musculoskeletal: Normal range of motion.  Lymphadenopathy:       Right: No inguinal adenopathy present.       Left: No inguinal adenopathy present.  Neurological: She is alert and oriented to person, place, and time. No cranial nerve deficit.  Skin: Skin is warm and dry.  Psychiatric: She has a  normal mood and affect. Her behavior is normal. Judgment and thought content normal.  Nursing note and vitals reviewed.         Assessment & Plan:   Medicare annual wellness visit, subsequent  Essential hypertension  Hyperlipidemia  Chronic leukopenia  Positive ANA (antinuclear antibody) No Follow-up on file.

## 2015-04-12 NOTE — Addendum Note (Signed)
Addended by: Modena Nunnery on: 04/12/2015 12:41 PM   Modules accepted: Orders

## 2015-04-12 NOTE — Assessment & Plan Note (Signed)
Well controlled on current rxs. No changes made today. 

## 2015-04-12 NOTE — Assessment & Plan Note (Signed)
The patients weight, height, BMI and visual acuity have been recorded in the chart.  Cognitive function assessed.   I have made referrals, counseling and provided education to the patient based review of the above and I have provided the pt with a written personalized care plan for preventive services.  

## 2015-04-12 NOTE — Patient Instructions (Signed)
Great to see you. Congratulations on your upcoming wedding!

## 2015-04-12 NOTE — Assessment & Plan Note (Signed)
Discussed USPSTF recommendations of cervical cancer screening.  She is aware that guidelines state she does not need routine screening at her age or because she has had a hysterectomy but now that she has a new sexual partner, she is asking for one.  Pap smear done today.

## 2015-04-14 ENCOUNTER — Encounter: Payer: Self-pay | Admitting: *Deleted

## 2015-04-14 LAB — CYTOLOGY - PAP

## 2015-04-16 LAB — CERVICOVAGINAL ANCILLARY ONLY
BACTERIAL VAGINITIS: NEGATIVE
CANDIDA VAGINITIS: NEGATIVE

## 2015-04-18 LAB — CERVICOVAGINAL ANCILLARY ONLY: Herpes: NEGATIVE

## 2015-05-25 ENCOUNTER — Other Ambulatory Visit: Payer: Self-pay | Admitting: Family Medicine

## 2015-05-25 DIAGNOSIS — Z1231 Encounter for screening mammogram for malignant neoplasm of breast: Secondary | ICD-10-CM

## 2015-06-01 ENCOUNTER — Ambulatory Visit
Admission: RE | Admit: 2015-06-01 | Discharge: 2015-06-01 | Disposition: A | Discharge status: Home / Self Care | Payer: Medicare PPO | Source: Ambulatory Visit | Attending: Family Medicine | Admitting: Family Medicine

## 2015-06-01 ENCOUNTER — Other Ambulatory Visit: Payer: Self-pay | Admitting: Family Medicine

## 2015-06-01 DIAGNOSIS — Z1231 Encounter for screening mammogram for malignant neoplasm of breast: Secondary | ICD-10-CM

## 2015-06-08 ENCOUNTER — Other Ambulatory Visit: Payer: Self-pay | Admitting: Family Medicine

## 2015-06-08 DIAGNOSIS — R928 Other abnormal and inconclusive findings on diagnostic imaging of breast: Secondary | ICD-10-CM

## 2015-06-22 ENCOUNTER — Ambulatory Visit
Admission: RE | Admit: 2015-06-22 | Discharge: 2015-06-22 | Disposition: A | Discharge status: Home / Self Care | Payer: Medicare PPO | Source: Ambulatory Visit | Attending: Family Medicine | Admitting: Family Medicine

## 2015-06-22 DIAGNOSIS — R928 Other abnormal and inconclusive findings on diagnostic imaging of breast: Secondary | ICD-10-CM

## 2015-06-22 DIAGNOSIS — N63 Unspecified lump in breast: Secondary | ICD-10-CM | POA: Insufficient documentation

## 2015-06-23 ENCOUNTER — Other Ambulatory Visit: Payer: Self-pay | Admitting: Family Medicine

## 2015-06-23 DIAGNOSIS — R928 Other abnormal and inconclusive findings on diagnostic imaging of breast: Secondary | ICD-10-CM

## 2015-07-03 ENCOUNTER — Ambulatory Visit
Admission: RE | Admit: 2015-07-03 | Discharge: 2015-07-03 | Disposition: A | Discharge status: Home / Self Care | Payer: Medicare PPO | Source: Ambulatory Visit | Attending: Family Medicine | Admitting: Family Medicine

## 2015-07-03 DIAGNOSIS — C50211 Malignant neoplasm of upper-inner quadrant of right female breast: Secondary | ICD-10-CM | POA: Diagnosis not present

## 2015-07-03 DIAGNOSIS — R928 Other abnormal and inconclusive findings on diagnostic imaging of breast: Secondary | ICD-10-CM

## 2015-07-03 DIAGNOSIS — N63 Unspecified lump in breast: Secondary | ICD-10-CM | POA: Diagnosis not present

## 2015-07-04 ENCOUNTER — Telehealth: Payer: Self-pay | Admitting: Family Medicine

## 2015-07-04 DIAGNOSIS — C50912 Malignant neoplasm of unspecified site of left female breast: Secondary | ICD-10-CM

## 2015-07-04 NOTE — Telephone Encounter (Signed)
Received a phone call from Dr. Leigh Aurora at [facility] pathology- right breast mass consistent with invasive mammary carcinoma and she stated that I am to inform pt of these results (no official report yet available). Gave patients results.  Refer to breast surgeon.

## 2015-07-06 ENCOUNTER — Other Ambulatory Visit: Payer: Self-pay | Admitting: Internal Medicine

## 2015-07-06 ENCOUNTER — Ambulatory Visit (INDEPENDENT_AMBULATORY_CARE_PROVIDER_SITE_OTHER): Payer: Medicare PPO | Admitting: General Surgery

## 2015-07-06 ENCOUNTER — Encounter: Payer: Self-pay | Admitting: General Surgery

## 2015-07-06 VITALS — BP 126/58 | HR 88 | Resp 14 | Ht 62.0 in | Wt 134.0 lb

## 2015-07-06 DIAGNOSIS — C50911 Malignant neoplasm of unspecified site of right female breast: Secondary | ICD-10-CM | POA: Diagnosis not present

## 2015-07-06 DIAGNOSIS — C50919 Malignant neoplasm of unspecified site of unspecified female breast: Secondary | ICD-10-CM

## 2015-07-06 NOTE — Progress Notes (Signed)
Patient ID: Sandra [name], female   DOB: 01-Oct-1945, 69 y.o.   MRN: [id number]  Chief Complaint  Patient presents with  . Breast Problem    positive breast cancer    HPI GABI MCFATE is a 69 y.o. female.  who presents for surgical treatment of recently diagnosed right breast cancer. She had a right breast biopsy that was done on 07/03/15 was positive for breast cancer. Left breast biopsy revealed benign findings in both areas sampled-one a fibroadenoma, the other cystic changes. The most recent mammogram was done on 06-01-15.   Patient does perform regular self breast checks and gets regular mammograms done.   I have reviewed the history of present illness with the patient.  HPI  Past Medical History  Diagnosis Date  . Hypertension   . Hyperlipidemia     Past Surgical History  Procedure Laterality Date  . Abdominal hysterectomy  1987  . Fatty tumor Left 2013    side  . Bunionectomy Right 1993  . Breast biopsy Right     neg  . Breast biopsy Left 07/03/2015    4 oclock, FIBROEPITHELIAL LESION FAVOR CELLULAR FIBROADENOMA  . Breast biopsy Left 07-03-15    2 oclock, CYSTIC APOCRINE METAPLASIA  . Breast biopsy Right 07-03-15    130 INVASIVE MAMMARY CARCINOMA, NO SPECIAL TYPE    Family History  Problem Relation Age of Onset  . Cancer Sister 63    breast cancer  . Colon cancer Neg Hx     Social History Social History  Substance Use Topics  . Smoking status: Never Smoker   . Smokeless tobacco: Never Used  . Alcohol Use: No    Allergies  Allergen Reactions  . Lovastatin Anaphylaxis    Tongue swelling    Current Outpatient Prescriptions  Medication Sig Dispense Refill  . Calcium Carbonate (CALCIUM 500 PO) Take 1 tablet by mouth daily.      . hydrochlorothiazide (HYDRODIURIL) 25 MG tablet TAKE ONE TABLET BY MOUTH EVERY DAY 90 tablet 3  . pravastatin (PRAVACHOL) 10 MG tablet Take 1 tablet (10 mg total) by mouth daily. 90 tablet 3  . amLODipine (NORVASC) 5 MG  tablet TAKE ONE TABLET BY MOUTH EVERY DAY (Patient not taking: Reported on 07/06/2015) 90 tablet 3   No current facility-administered medications for this visit.    Review of Systems Review of Systems  Constitutional: Negative.   Respiratory: Negative.   Cardiovascular: Negative.     Blood pressure 126/58, pulse 88, resp. rate 14, height $RemoveBe'5\' 2"'MDHaQNIlF$  (1.575 m), weight 134 lb (60.782 kg).  Physical Exam Physical Exam  Constitutional: She is oriented to person, place, and time. She appears well-developed and well-nourished.  Eyes: Conjunctivae are normal. No scleral icterus.  Neck: Neck supple.  Cardiovascular: Normal rate, regular rhythm and normal heart sounds.   Pulmonary/Chest: Effort normal and breath sounds normal. Right breast exhibits no inverted nipple, no mass, no nipple discharge, no skin change and no tenderness. Left breast exhibits no inverted nipple, no mass, no nipple discharge, no skin change and no tenderness.  B/l breast biopsy sites healing well with minimal ecchymosis, no signs of infection or masses.  Abdominal: Soft. Bowel sounds are normal. There is no tenderness.  Lymphadenopathy:    She has no cervical adenopathy.    She has no axillary adenopathy.  Neurological: She is alert and oriented to person, place, and time.  Skin: Skin is warm and dry.    Data Reviewed Progress notes, mammogram, and breast ultrasound.  Path- right invasive mammary CA, ER/PR positive, her 2 2+, FISH pending. Size of primary 0.84cm  Assessment    Right breast cancer, ER/PR and HER2 pending. Clinical Stage - T1b,N0,M0    Plan    Surgical options discussed (mastectomy vs lumpectomy) in full with patient. Patient agreeable to lumpectomy with SN biopsy. She is also agreeable to excision of left breast mass concurrently. Patient will need radiation therapy as well following the procedure. Right breast ultrasound few days prior to surgery to see if lesion is identifiable or whether she will  need mammographic wire loc.Marland Kitchen      Patient's surgery has been scheduled for 07-13-15 at [facility].  PCP:  Mackey Birchwood 07/06/2015, 5:32 PM

## 2015-07-06 NOTE — Patient Instructions (Addendum)
The patient is aware to call back for any questions or concerns.  Lumpectomy A lumpectomy is a form of "breast conserving" or "breast preservation" surgery. It may also be referred to as a partial mastectomy. During a lumpectomy, the portion of the breast that contains the cancerous tumor or breast mass (the lump) is removed. Some normal tissue around the lump may also be removed to make sure all of the tumor has been removed.  LET [facility] CARE PROVIDER KNOW ABOUT:  Any allergies you have.  All medicines you are taking, including vitamins, herbs, eye drops, creams, and over-the-counter medicines.  Previous problems you or members of your family have had with the use of anesthetics.  Any blood disorders you have.  Previous surgeries you have had.  Medical conditions you have. RISKS AND COMPLICATIONS Generally, this is a safe procedure. However, problems can occur and include:  Bleeding.  Infection.  Pain.  Temporary swelling.  Change in the shape of the breast, particularly if a large portion is removed. BEFORE THE PROCEDURE  Ask your health care provider about changing or stopping your regular medicines. This is especially important if you are taking diabetes medicines or blood thinners.  Do not eat or drink anything after midnight on the night before the procedure or as directed by your health care provider. Ask your health care provider if you can take a sip of water with any approved medicines.  On the day of surgery, your health care provider will use a mammogram or ultrasound to locate and mark the tumor in your breast. These markings on your breast will show where the cut (incision) will be made. PROCEDURE   An IV tube will be put into one of your veins.  You may be given medicine to help you relax before the surgery (sedative). You will be given one of the following:  A medicine that numbs the area (local anesthetic).  A medicine that makes you fall asleep  (general anesthetic).  Your health care provider will use a kind of electric scalpel that uses heat to minimize bleeding (electrocautery knife).  A curved incision (like a smile or frown) that follows the natural curve of your breast is made, to allow for minimal scarring and better healing.  The tumor will be removed with some of the surrounding tissue. This will be sent to the lab for analysis. Your health care provider may also remove your lymph nodes at this time if needed.  Sometimes, but not always, a rubber tube called a drain will be surgically inserted into your breast area or armpit to collect excess fluid that may accumulate in the space where the tumor was. This drain is connected to a plastic bulb on the outside of your body. This drain creates suction to help remove the fluid.  The incisions will be closed with stitches (sutures).  A bandage may be placed over the incisions. AFTER THE PROCEDURE  You will be taken to the recovery area.  You will be given medicine for pain.  A small rubber drain may be placed in the breast for 2-3 days to prevent a collection of blood (hematoma) from developing in the breast. You will be given instructions on caring for the drain before you go home.  A pressure bandage (dressing) will be applied for 1-2 days to prevent bleeding. Ask your health care provider how to care for your bandage at home.   This information is not intended to replace advice given to you by  your health care provider. Make sure you discuss any questions you have with your health care provider.   Document Released: 09/02/2006 Document Revised: 08/12/2014 Document Reviewed: 12/25/2012 Elsevier Interactive Patient Education 2016 [employer].  Patient's surgery has been scheduled for 07-13-15 at [facility].

## 2015-07-10 ENCOUNTER — Encounter: Payer: Self-pay | Admitting: *Deleted

## 2015-07-10 ENCOUNTER — Telehealth: Payer: Self-pay

## 2015-07-10 ENCOUNTER — Inpatient Hospital Stay: Admission: RE | Admit: 2015-07-10 | Payer: Medicare PPO | Source: Ambulatory Visit

## 2015-07-10 NOTE — Telephone Encounter (Signed)
Patient notified of arrival time and location for her surgery on 07/13/15. Patient is to report to the radiology desk on 07/13/15 at 8:00 am. Patient is aware of date, time, and instructions.

## 2015-07-10 NOTE — Patient Instructions (Signed)
  Your procedure is scheduled on: 07-13-15 (THURSDAY) Report to [place] (2ND DESK ON RIGHT) @ 8 AM   Remember: Instructions that are not followed completely may result in serious medical risk, up to and including death, or upon the discretion of your surgeon and anesthesiologist your surgery may need to be rescheduled.    _X___ 1. Do not eat food or drink liquids after midnight. No gum chewing or hard candies.     _X___ 2. No Alcohol for 24 hours before or after surgery.   ____ 3. Bring all medications with you on the day of surgery if instructed.    _X___ 4. Notify your doctor if there is any change in your medical condition     (cold, fever, infections).     Do not wear jewelry, make-up, hairpins, clips or nail polish.  Do not wear lotions, powders, or perfumes. You may wear deodorant.  Do not shave 48 hours prior to surgery. Men may shave face and neck.  Do not bring valuables to the hospital.    [facility] is not responsible for any belongings or valuables.               Contacts, dentures or bridgework may not be worn into surgery.  Leave your suitcase in the car. After surgery it may be brought to your room.  For patients admitted to the hospital, discharge time is determined by your treatment team.   Patients discharged the day of surgery will not be allowed to drive home.   Please read over the following fact sheets that you were given:     _X___ Take these medicines the morning of surgery with A SIP OF WATER:    1. AMLODIPINE (NORVASC)  2.   3.   4.  5.  6.  ____ Fleet Enema (as directed)   _X___ Use CHG Soap as directed  ____ Use inhalers on the day of surgery  ____ Stop metformin 2 days prior to surgery    ____ Take 1/2 of usual insulin dose the night before surgery and none on the morning of surgery.   ____ Stop Coumadin/Plavix/aspirin-N/A  ____ Stop Anti-inflammatories-NO NSAIDS OR ASA PRODUCTS-TYLENOL OK   _X___ Stop supplements  until after surgery-STOP BIOTIN AND ALFALFA NOW   ____ Bring C-Pap to the hospital.

## 2015-07-11 ENCOUNTER — Encounter
Admission: RE | Admit: 2015-07-11 | Discharge: 2015-07-11 | Disposition: A | Discharge status: Home / Self Care | Payer: Medicare PPO | Source: Ambulatory Visit | Attending: Anesthesiology | Admitting: Anesthesiology

## 2015-07-11 ENCOUNTER — Telehealth: Payer: Self-pay

## 2015-07-11 ENCOUNTER — Inpatient Hospital Stay: Payer: Medicare PPO | Admitting: Oncology

## 2015-07-11 DIAGNOSIS — Z90710 Acquired absence of both cervix and uterus: Secondary | ICD-10-CM | POA: Diagnosis not present

## 2015-07-11 DIAGNOSIS — C50211 Malignant neoplasm of upper-inner quadrant of right female breast: Secondary | ICD-10-CM | POA: Diagnosis not present

## 2015-07-11 DIAGNOSIS — Z888 Allergy status to other drugs, medicaments and biological substances status: Secondary | ICD-10-CM | POA: Diagnosis not present

## 2015-07-11 DIAGNOSIS — I10 Essential (primary) hypertension: Secondary | ICD-10-CM | POA: Diagnosis not present

## 2015-07-11 DIAGNOSIS — C50911 Malignant neoplasm of unspecified site of right female breast: Secondary | ICD-10-CM | POA: Diagnosis present

## 2015-07-11 DIAGNOSIS — D242 Benign neoplasm of left breast: Secondary | ICD-10-CM | POA: Diagnosis not present

## 2015-07-11 DIAGNOSIS — Z803 Family history of malignant neoplasm of breast: Secondary | ICD-10-CM | POA: Diagnosis not present

## 2015-07-11 DIAGNOSIS — N63 Unspecified lump in breast: Secondary | ICD-10-CM | POA: Diagnosis present

## 2015-07-11 DIAGNOSIS — Z87892 Personal history of anaphylaxis: Secondary | ICD-10-CM | POA: Diagnosis not present

## 2015-07-11 DIAGNOSIS — E785 Hyperlipidemia, unspecified: Secondary | ICD-10-CM | POA: Diagnosis not present

## 2015-07-11 DIAGNOSIS — Z79899 Other long term (current) drug therapy: Secondary | ICD-10-CM | POA: Diagnosis not present

## 2015-07-11 LAB — POTASSIUM: Potassium: 3.1 mmol/L — ABNORMAL LOW (ref 3.5–5.1)

## 2015-07-11 LAB — SURGICAL PATHOLOGY

## 2015-07-11 MED ORDER — POTASSIUM CHLORIDE 20 MEQ PO PACK: 20.0000 meq | PACK | Freq: Two times a day (BID) | ORAL | Status: DC

## 2015-07-11 NOTE — Telephone Encounter (Signed)
Received call from [facility] in pre admit testing. Patient's potassium was low today 1.3. Call to Dr Jamal Collin and patient is to start Potassium 20 meq one PO twice daily. Labs will be rechecked the day of surgery on 07/13/15. Patient notified and will pick up and start tonight.

## 2015-07-11 NOTE — Pre-Procedure Instructions (Signed)
CALLED CAROLYN AT [facility] OFFICE AND NOTIFIED HER OF PTS LOW K+ 3.1-ALSO FAXED RESULTS TO OFFICE

## 2015-07-12 ENCOUNTER — Ambulatory Visit (INDEPENDENT_AMBULATORY_CARE_PROVIDER_SITE_OTHER): Payer: Medicare PPO | Admitting: General Surgery

## 2015-07-12 ENCOUNTER — Encounter: Payer: Self-pay | Admitting: General Surgery

## 2015-07-12 ENCOUNTER — Other Ambulatory Visit: Payer: Medicare PPO

## 2015-07-12 ENCOUNTER — Ambulatory Visit: Payer: Medicare PPO

## 2015-07-12 ENCOUNTER — Encounter: Payer: Self-pay | Admitting: Diagnostic Radiology

## 2015-07-12 ENCOUNTER — Other Ambulatory Visit: Payer: Self-pay | Admitting: General Surgery

## 2015-07-12 VITALS — BP 130/74 | HR 72 | Resp 14 | Ht 62.0 in | Wt 133.0 lb

## 2015-07-12 DIAGNOSIS — C50911 Malignant neoplasm of unspecified site of right female breast: Secondary | ICD-10-CM

## 2015-07-12 DIAGNOSIS — N6320 Unspecified lump in the left breast, unspecified quadrant: Secondary | ICD-10-CM

## 2015-07-12 DIAGNOSIS — N63 Unspecified lump in breast: Secondary | ICD-10-CM | POA: Diagnosis not present

## 2015-07-12 NOTE — Patient Instructions (Addendum)
The patient is aware to call back for any questions or concerns.  Surgery as scheduled for 07-13-15 at [facility].

## 2015-07-12 NOTE — Progress Notes (Signed)
Patient ID: Sandra [name], female   DOB: 01/16/1946, 69 y.o.   MRN: [id number]  Chief Complaint  Patient presents with  . Pre-op Exam    HPI Sandra [name] is a 69 y.o. female.  Here today for her preop exam, right breast lumpectomy and SN biopsy with left breast mass excision on 07-13-15. Pre op ultrasound to localize breast mass. A right breast biopsy that was done on 07/03/15 was positive for breast cancer. Left breast biopsy revealed benign findings in both areas sampled-one a fibroadenoma, the other cystic changes.  I have reviewed the history of present illness with the patient. HPI  Past Medical History  Diagnosis Date  . Hypertension   . Hyperlipidemia   . Cancer [facility])     Past Surgical History  Procedure Laterality Date  . Abdominal hysterectomy  1987  . Fatty tumor Left 2013    side  . Bunionectomy Right 1993  . Breast biopsy Right     neg  . Breast biopsy Left 07/03/2015    4 oclock, FIBROEPITHELIAL LESION FAVOR CELLULAR FIBROADENOMA  . Breast biopsy Left 07-03-15    2 oclock, CYSTIC APOCRINE METAPLASIA  . Breast biopsy Right 07-03-15    130 INVASIVE MAMMARY CARCINOMA, NO SPECIAL TYPE    Family History  Problem Relation Age of Onset  . Cancer Sister 94    breast cancer  . Colon cancer Neg Hx     Social History Social History  Substance Use Topics  . Smoking status: Never Smoker   . Smokeless tobacco: Never Used  . Alcohol Use: No    Allergies  Allergen Reactions  . Lovastatin Anaphylaxis    Tongue swelling    Current Outpatient Prescriptions  Medication Sig Dispense Refill  . Alfalfa 500 MG TABS Take 1 tablet by mouth 3 (three) times daily.    Marland Kitchen amLODipine (NORVASC) 5 MG tablet TAKE ONE TABLET BY MOUTH EVERY DAY (Patient taking differently: Take 5 mg by mouth every morning. TAKE ONE TABLET BY MOUTH EVERY DAY) 90 tablet 3  . Biotin (BIOTIN MAXIMUM STRENGTH) 10 MG TABS Take 1 tablet by mouth daily.    . hydrochlorothiazide (HYDRODIURIL) 25  MG tablet TAKE ONE TABLET BY MOUTH EVERY DAY 90 tablet 3  . potassium chloride (KLOR-CON) 20 MEQ packet Take 20 mEq by mouth 2 (two) times daily. 10 tablet 0  . pravastatin (PRAVACHOL) 10 MG tablet Take 1 tablet (10 mg total) by mouth daily. (Patient taking differently: Take 10 mg by mouth at bedtime. ) 90 tablet 3   No current facility-administered medications for this visit.    Review of Systems Review of Systems  Constitutional: Negative.   Respiratory: Negative.   Cardiovascular: Negative.     Blood pressure 130/74, pulse 72, resp. rate 14, height 5\' 2"  (1.575 m), weight 133 lb (60.328 kg).  Physical Exam Physical Exam  Constitutional: She is oriented to person, place, and time. She appears well-developed and well-nourished.  HENT:  Mouth/Throat: Oropharynx is clear and moist.  Eyes: Conjunctivae are normal. No scleral icterus.  Neck: Neck supple.  Lymphadenopathy:    She has no cervical adenopathy.  Neurological: She is alert and oriented to person, place, and time.  Skin: Skin is warm.  Psychiatric: Her behavior is normal.    Data Reviewed Progress notes and prior ultrasound reviewed. Targeted US of right breast at 1.30 ocl 3cm fn shows a very vagus hypoechoic area with mild shadowing-likely this is the site CA Clip is not seen  well. Therefore wire loc wit mammogram to be done preop. The mass in left breast 4 ocl 7cmfn is easily identified. Assessment    Right breast invasive mammary CA, ER/PR positive, her 2 2+, FISH pending. Clinical Stage - T1b,N0,M0. Left breast mass.    Plan     Surgery as planned. Will have wire placement preop for right breast     Surgery as scheduled for 07-13-15 at [facility].  PCP:  Mackey Birchwood 07/12/2015, 11:01 AM

## 2015-07-13 ENCOUNTER — Ambulatory Visit
Admission: RE | Admit: 2015-07-13 | Discharge: 2015-07-13 | Disposition: A | Discharge status: Home / Self Care | Payer: Medicare PPO | Source: Ambulatory Visit | Attending: General Surgery | Admitting: General Surgery

## 2015-07-13 ENCOUNTER — Ambulatory Visit: Payer: Medicare PPO

## 2015-07-13 ENCOUNTER — Ambulatory Visit: Admission: RE | Admit: 2015-07-13 | Payer: Medicare PPO | Source: Ambulatory Visit

## 2015-07-13 ENCOUNTER — Encounter: Payer: Self-pay | Admitting: *Deleted

## 2015-07-13 ENCOUNTER — Ambulatory Visit: Payer: Medicare PPO | Admitting: Anesthesiology

## 2015-07-13 ENCOUNTER — Encounter
Admission: RE | Disposition: A | Discharge status: Home / Self Care | Payer: Self-pay | Source: Ambulatory Visit | Attending: General Surgery

## 2015-07-13 DIAGNOSIS — C50811 Malignant neoplasm of overlapping sites of right female breast: Secondary | ICD-10-CM | POA: Diagnosis not present

## 2015-07-13 DIAGNOSIS — C50911 Malignant neoplasm of unspecified site of right female breast: Secondary | ICD-10-CM

## 2015-07-13 DIAGNOSIS — Z87892 Personal history of anaphylaxis: Secondary | ICD-10-CM | POA: Diagnosis not present

## 2015-07-13 DIAGNOSIS — Z803 Family history of malignant neoplasm of breast: Secondary | ICD-10-CM | POA: Insufficient documentation

## 2015-07-13 DIAGNOSIS — E785 Hyperlipidemia, unspecified: Secondary | ICD-10-CM | POA: Diagnosis not present

## 2015-07-13 DIAGNOSIS — C50211 Malignant neoplasm of upper-inner quadrant of right female breast: Secondary | ICD-10-CM | POA: Insufficient documentation

## 2015-07-13 DIAGNOSIS — Z01818 Encounter for other preprocedural examination: Secondary | ICD-10-CM | POA: Diagnosis not present

## 2015-07-13 DIAGNOSIS — Z79899 Other long term (current) drug therapy: Secondary | ICD-10-CM | POA: Diagnosis not present

## 2015-07-13 DIAGNOSIS — I10 Essential (primary) hypertension: Secondary | ICD-10-CM | POA: Insufficient documentation

## 2015-07-13 DIAGNOSIS — C50919 Malignant neoplasm of unspecified site of unspecified female breast: Secondary | ICD-10-CM

## 2015-07-13 DIAGNOSIS — Z90710 Acquired absence of both cervix and uterus: Secondary | ICD-10-CM | POA: Insufficient documentation

## 2015-07-13 DIAGNOSIS — D242 Benign neoplasm of left breast: Secondary | ICD-10-CM | POA: Diagnosis not present

## 2015-07-13 DIAGNOSIS — N63 Unspecified lump in breast: Secondary | ICD-10-CM | POA: Diagnosis not present

## 2015-07-13 DIAGNOSIS — Z888 Allergy status to other drugs, medicaments and biological substances status: Secondary | ICD-10-CM | POA: Insufficient documentation

## 2015-07-13 DIAGNOSIS — Z9011 Acquired absence of right breast and nipple: Secondary | ICD-10-CM | POA: Diagnosis not present

## 2015-07-13 HISTORY — DX: Malignant neoplasm of unspecified site of unspecified female breast: C50.919

## 2015-07-13 HISTORY — PX: BREAST LUMPECTOMY WITH SENTINEL LYMPH NODE BIOPSY: SHX5597

## 2015-07-13 HISTORY — PX: BREAST BIOPSY: SHX20

## 2015-07-13 HISTORY — PX: BREAST LUMPECTOMY: SHX2

## 2015-07-13 LAB — POCT I-STAT 4, (NA,K, GLUC, HGB,HCT)
Glucose, Bld: 98 mg/dL (ref 65–99)
HEMATOCRIT: 40 % (ref 36.0–46.0)
HEMOGLOBIN: 13.6 g/dL (ref 12.0–15.0)
POTASSIUM: 3.4 mmol/L — AB (ref 3.5–5.1)
SODIUM: 141 mmol/L (ref 135–145)

## 2015-07-13 SURGERY — BREAST LUMPECTOMY WITH SENTINEL LYMPH NODE BX
Anesthesia: General | Laterality: Right | Wound class: Clean

## 2015-07-13 MED ORDER — GLYCOPYRROLATE 0.2 MG/ML IJ SOLN
INTRAMUSCULAR | Status: DC | PRN
  Administered 2015-07-13: 0.2 mg via INTRAVENOUS

## 2015-07-13 MED ORDER — EPHEDRINE SULFATE 50 MG/ML IJ SOLN
INTRAMUSCULAR | Status: DC | PRN
  Administered 2015-07-13: 5 mg via INTRAVENOUS

## 2015-07-13 MED ORDER — CHLORHEXIDINE GLUCONATE 4 % EX LIQD: 1.0000 "application " | Freq: Once | CUTANEOUS | Status: DC

## 2015-07-13 MED ORDER — ONDANSETRON HCL 4 MG/2ML IJ SOLN: 4.0000 mg | Freq: Once | INTRAMUSCULAR | Status: DC | PRN

## 2015-07-13 MED ORDER — FENTANYL CITRATE (PF) 100 MCG/2ML IJ SOLN
INTRAMUSCULAR | Status: DC | PRN
  Administered 2015-07-13 (×3): 50 ug via INTRAVENOUS

## 2015-07-13 MED ORDER — LIDOCAINE HCL (CARDIAC) 20 MG/ML IV SOLN
INTRAVENOUS | Status: DC | PRN
  Administered 2015-07-13: 80 mg via INTRAVENOUS

## 2015-07-13 MED ORDER — ACETAMINOPHEN 10 MG/ML IV SOLN
INTRAVENOUS | Status: AC
  Filled 2015-07-13: qty 100

## 2015-07-13 MED ORDER — CEFAZOLIN SODIUM-DEXTROSE 2-3 GM-% IV SOLR
INTRAVENOUS | Status: AC
  Filled 2015-07-13: qty 50

## 2015-07-13 MED ORDER — BUPIVACAINE HCL (PF) 0.5 % IJ SOLN
INTRAMUSCULAR | Status: AC
  Filled 2015-07-13: qty 30

## 2015-07-13 MED ORDER — ACETAMINOPHEN 10 MG/ML IV SOLN
INTRAVENOUS | Status: DC | PRN
  Administered 2015-07-13: 1000 mg via INTRAVENOUS

## 2015-07-13 MED ORDER — METHYLENE BLUE 1 % INJ SOLN
INTRAMUSCULAR | Status: AC
Start: 2015-07-13 — End: 2015-07-13
  Filled 2015-07-13: qty 10

## 2015-07-13 MED ORDER — ONDANSETRON HCL 4 MG/2ML IJ SOLN
INTRAMUSCULAR | Status: DC | PRN
  Administered 2015-07-13: 4 mg via INTRAVENOUS

## 2015-07-13 MED ORDER — DEXAMETHASONE SODIUM PHOSPHATE 10 MG/ML IJ SOLN
INTRAMUSCULAR | Status: DC | PRN
  Administered 2015-07-13: 10 mg via INTRAVENOUS

## 2015-07-13 MED ORDER — PROPOFOL 10 MG/ML IV BOLUS
INTRAVENOUS | Status: DC | PRN
  Administered 2015-07-13: 150 mg via INTRAVENOUS
  Administered 2015-07-13: 50 mg via INTRAVENOUS

## 2015-07-13 MED ORDER — CEFAZOLIN SODIUM-DEXTROSE 2-3 GM-% IV SOLR
2.0000 g | INTRAVENOUS | Status: AC
  Administered 2015-07-13: 2 g via INTRAVENOUS

## 2015-07-13 MED ORDER — FENTANYL CITRATE (PF) 100 MCG/2ML IJ SOLN
25.0000 ug | INTRAMUSCULAR | Status: DC | PRN
  Administered 2015-07-13 (×4): 25 ug via INTRAVENOUS

## 2015-07-13 MED ORDER — FAMOTIDINE 20 MG PO TABS
20.0000 mg | ORAL_TABLET | Freq: Once | ORAL | Status: AC
  Administered 2015-07-13: 20 mg via ORAL

## 2015-07-13 MED ORDER — TECHNETIUM TC 99M SULFUR COLLOID
1.0260 | Freq: Once | INTRAVENOUS | Status: AC | PRN
  Administered 2015-07-13: 1.026 via INTRAVENOUS

## 2015-07-13 MED ORDER — FENTANYL CITRATE (PF) 100 MCG/2ML IJ SOLN
INTRAMUSCULAR | Status: AC
  Administered 2015-07-13: 25 ug via INTRAVENOUS
  Filled 2015-07-13: qty 2

## 2015-07-13 MED ORDER — TRAMADOL HCL 50 MG PO TABS: 50.0000 mg | ORAL_TABLET | Freq: Four times a day (QID) | ORAL | Status: DC | PRN

## 2015-07-13 MED ORDER — MIDAZOLAM HCL 2 MG/2ML IJ SOLN
INTRAMUSCULAR | Status: DC | PRN
  Administered 2015-07-13: 2 mg via INTRAVENOUS

## 2015-07-13 MED ORDER — LACTATED RINGERS IV SOLN
INTRAVENOUS | Status: DC
  Administered 2015-07-13: 13:00:00 via INTRAVENOUS

## 2015-07-13 MED ORDER — PHENYLEPHRINE HCL 10 MG/ML IJ SOLN
INTRAMUSCULAR | Status: DC | PRN
  Administered 2015-07-13 (×3): 100 ug via INTRAVENOUS
  Administered 2015-07-13: 200 ug via INTRAVENOUS
  Administered 2015-07-13: 100 ug via INTRAVENOUS
  Administered 2015-07-13: 200 ug via INTRAVENOUS
  Administered 2015-07-13 (×3): 100 ug via INTRAVENOUS

## 2015-07-13 MED ORDER — FAMOTIDINE 20 MG PO TABS
ORAL_TABLET | ORAL | Status: AC
  Filled 2015-07-13: qty 1

## 2015-07-13 MED ORDER — SODIUM CHLORIDE 0.9 % IJ SOLN
INTRAMUSCULAR | Status: AC
  Filled 2015-07-13: qty 10

## 2015-07-13 SURGICAL SUPPLY — 36 items
BLADE SURG 15 STRL SS SAFETY (BLADE) ×8 IMPLANT
BULB RESERV EVAC DRAIN JP 100C (MISCELLANEOUS) ×2 IMPLANT
CANISTER SUCT 1200ML W/VALVE (MISCELLANEOUS) ×4 IMPLANT
CHLORAPREP W/TINT 26ML (MISCELLANEOUS) ×6 IMPLANT
CLOSURE WOUND 1/2 X4 (GAUZE/BANDAGES/DRESSINGS)
CNTNR SPEC 2.5X3XGRAD LEK (MISCELLANEOUS) ×8
CONT SPEC 4OZ STER OR WHT (MISCELLANEOUS) ×8
CONT SPEC 4OZ STRL OR WHT (MISCELLANEOUS) ×8
CONTAINER SPEC 2.5X3XGRAD LEK (MISCELLANEOUS) ×8 IMPLANT
COVER PROBE FLX POLY STRL (MISCELLANEOUS) ×4 IMPLANT
DEVICE DUBIN SPECIMEN MAMMOGRA (MISCELLANEOUS) ×2 IMPLANT
DEVICE LOCALIZATION ULTRAWIRE (WIRE) ×2 IMPLANT
DRAIN CHANNEL JP 15F RND 16 (MISCELLANEOUS) ×2 IMPLANT
DRAPE LAPAROTOMY TRNSV 106X77 (MISCELLANEOUS) ×4 IMPLANT
GLOVE BIO SURGEON STRL SZ7 (GLOVE) ×8 IMPLANT
GOWN STRL REUS W/ TWL LRG LVL3 (GOWN DISPOSABLE) ×6 IMPLANT
GOWN STRL REUS W/TWL LRG LVL3 (GOWN DISPOSABLE) ×8
HARMONIC SCALPEL FOCUS (MISCELLANEOUS) ×2 IMPLANT
KIT RM TURNOVER STRD PROC AR (KITS) ×4 IMPLANT
LABEL OR SOLS (LABEL) ×4 IMPLANT
LIQUID BAND (GAUZE/BANDAGES/DRESSINGS) ×6 IMPLANT
MARGIN MAP 10MM (MISCELLANEOUS) ×4 IMPLANT
NDL HYPO 25X1 1.5 SAFETY (NEEDLE) ×2 IMPLANT
NDL SAFETY 22GX1.5 (NEEDLE) ×2 IMPLANT
NEEDLE HYPO 25X1 1.5 SAFETY (NEEDLE) ×4 IMPLANT
PACK BASIN MINOR ARMC (MISCELLANEOUS) ×4 IMPLANT
PAD GROUND ADULT SPLIT (MISCELLANEOUS) ×4 IMPLANT
SLEVE PROBE SENORX GAMMA FIND (MISCELLANEOUS) ×4 IMPLANT
STRIP CLOSURE SKIN 1/2X4 (GAUZE/BANDAGES/DRESSINGS) ×2 IMPLANT
SUT ETH BLK MONO 3 0 FS 1 12/B (SUTURE) ×8 IMPLANT
SUT MNCRL AB 3-0 PS2 27 (SUTURE) ×6 IMPLANT
SUT VIC AB 2-0 BRD 54 (SUTURE) ×4 IMPLANT
SUT VIC AB 2-0 CT2 27 (SUTURE) ×10 IMPLANT
SYRINGE 10CC LL (SYRINGE) ×4 IMPLANT
ULTRAWIRE LOCALIZATION DEVICE (WIRE) ×4
WATER STERILE IRR 1000ML POUR (IV SOLUTION) ×4 IMPLANT

## 2015-07-13 NOTE — Discharge Instructions (Signed)
AMBULATORY SURGERY  DISCHARGE INSTRUCTIONS   1) The drugs that you were given will stay in your system until tomorrow so for the next 24 hours you should not:  A) Drive an automobile B) Make any legal decisions C) Drink any alcoholic beverage   2) You may resume regular meals tomorrow.  Today it is better to start with liquids and gradually work up to solid foods.  You may eat anything you prefer, but it is better to start with liquids, then soup and crackers, and gradually work up to solid foods.   3) Please notify your doctor immediately if you have any unusual bleeding, trouble breathing, redness and pain at the surgery site, drainage, fever, or pain not relieved by medication.    4) Additional Instructions:    Breast Biopsy A breast biopsy is a test during which a sample of tissue is taken from your breast. The breast tissue is looked at under a microscope for cancer cells.  BEFORE THE PROCEDURE  Make plans to have someone drive you home after the test.  Do not smoke for 2 weeks before the test. Stop smoking, if you smoke.  Do not drink alcohol for 24 hours before the test.  Wear a good support bra to the test.  Your doctor may do a procedure to place a wire or a seed that gives off radiation in the breast lump. The wire or seed will help your doctor see the lump when doing the biopsy. PROCEDURE  You may be given one of the following:  A medicine to numb the breast area (local anesthetic).  A medicine to make you fall asleep (general anesthetic). There are different types of breast biopsies. They include:  Fine-needle aspiration.  A needle is put into the breast lump.  The needle takes out fluid and cells from the lump.  Ultrasound imaging may be used to help find the lump and to put the needle in the right spot.  Core-needle biopsy.  A needle is put into the breast lump.  The needle is put in your breast 3-6 times.  The needle removes breast  tissue.  An ultrasound image or X-ray is often used to find the right spot to put in the needle.  Stereotactic biopsy.  X-rays and a computer are used to study X-ray pictures of the breast lump.  The computer finds where the needle needs to be put into the breast.  Tissue samples are taken out.  Vacuum-assisted biopsy.  A small cut (incision) is made in your breast.  A biopsy device is put through the cut and into the breast tissue.  The biopsy device draws abnormal breast tissue into the biopsy device.  A large tissue sample is often removed.  No stitches are needed.  Ultrasound-guided core-needle biopsy.  Ultrasound imaging helps guide the needle into the area of the breast that is not normal.  A cut is made in the breast. The needle is put into the breast lump.  Tissue samples are taken out.  Open biopsy.  A large cut is made in the breast.  Your doctor will try to remove the whole breast lump or as much as possible. All tissue, fluid, or cell samples are looked at under a microscope.  AFTER THE PROCEDURE  You will be taken to an area to recover. You will be able to go home once you are doing well and are without problems.  You may have bruising on your breast. This is normal.  A pressure bandage (dressing) may be put on your breast for 24-48 hours. This type of bandage is wrapped tightly around your chest. It helps stop fluid from building up underneath tissues.   This information is not intended to replace advice given to you by your health care provider. Make sure you discuss any questions you have with your health care provider.   Document Released: 10/14/2011 Document Revised: 04/12/2015 Document Reviewed: 10/14/2011 Elsevier Interactive Patient Education 2016 [employer].      Please contact your physician with any problems or Same Day Surgery at [phone], Monday through Friday 6 am to 4 pm, or [place] at [facility] number at  [phone].

## 2015-07-13 NOTE — Interval H&P Note (Signed)
History and Physical Interval Note:  07/13/2015 12:23 PM  Sandra [name]  has presented today for surgery, with the diagnosis of CA RIGHT BREAST,FIBROADENON L BREAST  The various methods of treatment have been discussed with the patient and family. After consideration of risks, benefits and other options for treatment, the patient has consented to  Procedure(s): BREAST LUMPECTOMY WITH SENTINEL LYMPH NODE BX (Right) BREAST LUMPECTOMY (Left) as a surgical intervention .  The patient's history has been reviewed, patient examined, no change in status, stable for surgery.  I have reviewed the patient's chart and labs.  Questions were answered to the patient's satisfaction.     [name],[name]

## 2015-07-13 NOTE — Op Note (Signed)
Preop diagnosis: 1 carcinoma right breast. 2. Fibroepithelial mass left breast  Post op diagnosis: Same  Operation: Right breast lumpectomy with sentinel node biopsy and excision of left breast mass with wire localization using ultrasound  Surgeon: S.G.Dalanie Kisner   Assistant:     Anesthesia: Gen.  Complications: None  EBL: 25 mL  Drains: None  Description: Patient underwent a nuclear contrast injection for it in the right breast for node identification and also had wire localization with mammography for   right breast cancer. Patient was put to sleep in supine position the operating table. Both breasts were prepped and draped as sterile field. Timeout was. Sentinel node was identified of the Gamma finder and the small incision was made just along the inferior most portion of the medial aspect the left of the right axilla close to the edge of the breast. The incision was deepened through into this axillary fat plane and with the Gamma finder use 3 sentinel nodes were identified the most prominent 1 cm in size with the maximum activity on the Gamma finder. The other 2 had some limited activity in no much smaller. It was sent off as sentinel node 1,2and 3 with the touch prep on the sentinel node #1 being negative for macro metastases. Hemostasis obtained with cautery and ligatures of 3-0 Vicryl. The deeper tissues closed with 2-0 Vicryl interrupted stitches skin was closed with that 3-0 Monocryl subcuticular. The right breast lumpectomy was performed. The location of cancer was at the 1:30 location in the right breast about 3 cm away from the nipple. The wire was placed from medial location going towards the lesion. A curvilinear incision was made from about the 12 to 2:00 position and carefully deepened through the subcutaneous tissue. The skin and subcutaneous tissue with an elevated on both sides and the wire was freed from the skin. With the wire as a guide a wide excision was then completed all the  way down to the valve fascial level overlying the pectoralis major muscle. Grossly the margins appeared to be adequate with the lesion being as very much in the middle of the specimen. Specimen mammogram was obtained showing the clip to be in the central portion. Tissue was then sent to pathology for margin assessment it was also tagged for margins. After ensuring hemostasis the deeper layers were closed with the 2-0 Vicryl in 2 layers. Skin was closed with the 3-0 Monocryl subcuticular stitch. Both the breast and the axillary incisions were covered with liquid ban. Gloves were changed and new scalpel blade was used for left side.                   Ultrasound probe was brought to the field and a small hypoechoic mildly lobulated mass at the 4:00 location 7 cm from the nipple on the left side was identified. With the ultrasound guidance small stab incision was made and a Bard Ultra wire was positioned going through the lesion. Skin incision was made in a curvilinear fashion extending from the wire entrance inferiorly and then deepened through the subcutaneous tissue. Skin and subcutaneous tissue was elevated. Using the wire as a guide dissection was carried down and a mildly lobulated 1-1-1/2 cm mass was identified and removed in full. Previous core biopsy of this had shown this to be a fibroepithelial lesion. After ensuring hemostasis the wound was closed with 2-0 Vicryl in the deep tissue 1 3-0 Monocryl subcuticular for the skin. Liquid ban was applied. Patient tolerated the procedure  with no immediate problems encountered. She was subsequently returned recovery room stable condition

## 2015-07-13 NOTE — Anesthesia Procedure Notes (Signed)
Procedure Name: LMA Insertion Date/Time: 07/13/2015 1:14 PM Performed by: Silvana Newness Pre-anesthesia Checklist: Patient identified, Emergency Drugs available, Suction available, Patient being monitored and Timeout performed Patient Re-evaluated:Patient Re-evaluated prior to inductionOxygen Delivery Method: Circle system utilized Preoxygenation: Pre-oxygenation with 100% oxygen Intubation Type: IV induction Ventilation: Mask ventilation without difficulty LMA: LMA inserted LMA Size: 3.5 Number of attempts: 1 Placement Confirmation: positive ETCO2 and breath sounds checked- equal and bilateral Tube secured with: Tape Dental Injury: Teeth and Oropharynx as per pre-operative assessment

## 2015-07-13 NOTE — Transfer of Care (Signed)
Immediate Anesthesia Transfer of Care Note  Patient: Sandra [name]  Procedure(s) Performed: Procedure(s): BREAST LUMPECTOMY WITH SENTINEL LYMPH NODE BX (Right) BREAST LUMPECTOMY (Left)  Patient Location: PACU  Anesthesia Type:General  Level of Consciousness: awake, alert , oriented and patient cooperative  Airway & Oxygen Therapy: Patient Spontanous Breathing and Patient connected to face mask oxygen  Post-op Assessment: Report given to RN, Post -op Vital signs reviewed and stable and Patient moving all extremities X 4  Post vital signs: Reviewed and stable  Last Vitals:  Filed Vitals:   07/13/15 1219 07/13/15 1220  BP: 126/79   Pulse: 69   Temp: 36.7 C 36.6 C  Resp: 16     Complications: No apparent anesthesia complications

## 2015-07-13 NOTE — Anesthesia Preprocedure Evaluation (Signed)
Anesthesia Evaluation  Patient identified by MRN, date of birth, ID band Patient awake    Reviewed: Allergy & Precautions, NPO status , Patient's Chart, lab work & pertinent test results  Airway Mallampati: II       Dental no notable dental hx. (+) Teeth Intact   Pulmonary neg pulmonary ROS,    breath sounds clear to auscultation       Cardiovascular Exercise Tolerance: Good hypertension, Pt. on medications  Rhythm:Regular Rate:Normal     Neuro/Psych    GI/Hepatic negative GI ROS, Neg liver ROS,   Endo/Other  negative endocrine ROS  Renal/GU negative Renal ROS     Musculoskeletal negative musculoskeletal ROS (+)   Abdominal Normal abdominal exam  (+)   Peds  Hematology negative hematology ROS (+)   Anesthesia Other Findings   Reproductive/Obstetrics                             Anesthesia Physical Anesthesia Plan  ASA: II  Anesthesia Plan: General   Post-op Pain Management:    Induction: Intravenous  Airway Management Planned: LMA  Additional Equipment:   Intra-op Plan:   Post-operative Plan: Extubation in OR  Informed Consent: I have reviewed the patients History and Physical, chart, labs and discussed the procedure including the risks, benefits and alternatives for the proposed anesthesia with the patient or authorized representative who has indicated his/her understanding and acceptance.     Plan Discussed with: CRNA  Anesthesia Plan Comments:         Anesthesia Quick Evaluation

## 2015-07-13 NOTE — H&P (View-Only) (Signed)
Patient ID: Sandra [name], female   DOB: January 04, 1946, 69 y.o.   MRN: [id number]  Chief Complaint  Patient presents with  . Breast Problem    positive breast cancer    HPI Sandra [name] is a 69 y.o. female.  who presents for surgical treatment of recently diagnosed right breast cancer. She had a right breast biopsy that was done on 07/03/15 was positive for breast cancer. Left breast biopsy revealed benign findings in both areas sampled-one a fibroadenoma, the other cystic changes. The most recent mammogram was done on 06-01-15.   Patient does perform regular self breast checks and gets regular mammograms done.   I have reviewed the history of present illness with the patient.  HPI  Past Medical History  Diagnosis Date  . Hypertension   . Hyperlipidemia     Past Surgical History  Procedure Laterality Date  . Abdominal hysterectomy  1987  . Fatty tumor Left 2013    side  . Bunionectomy Right 1993  . Breast biopsy Right     neg  . Breast biopsy Left 07/03/2015    4 oclock, FIBROEPITHELIAL LESION FAVOR CELLULAR FIBROADENOMA  . Breast biopsy Left 07-03-15    2 oclock, CYSTIC APOCRINE METAPLASIA  . Breast biopsy Right 07-03-15    130 INVASIVE MAMMARY CARCINOMA, NO SPECIAL TYPE    Family History  Problem Relation Age of Onset  . Cancer Sister 32    breast cancer  . Colon cancer Neg Hx     Social History Social History  Substance Use Topics  . Smoking status: Never Smoker   . Smokeless tobacco: Never Used  . Alcohol Use: No    Allergies  Allergen Reactions  . Lovastatin Anaphylaxis    Tongue swelling    Current Outpatient Prescriptions  Medication Sig Dispense Refill  . Calcium Carbonate (CALCIUM 500 PO) Take 1 tablet by mouth daily.      . hydrochlorothiazide (HYDRODIURIL) 25 MG tablet TAKE ONE TABLET BY MOUTH EVERY DAY 90 tablet 3  . pravastatin (PRAVACHOL) 10 MG tablet Take 1 tablet (10 mg total) by mouth daily. 90 tablet 3  . amLODipine (NORVASC) 5 MG  tablet TAKE ONE TABLET BY MOUTH EVERY DAY (Patient not taking: Reported on 07/06/2015) 90 tablet 3   No current facility-administered medications for this visit.    Review of Systems Review of Systems  Constitutional: Negative.   Respiratory: Negative.   Cardiovascular: Negative.     Blood pressure 126/58, pulse 88, resp. rate 14, height $RemoveBe'5\' 2"'pzuKCOOka$  (1.575 m), weight 134 lb (60.782 kg).  Physical Exam Physical Exam  Constitutional: She is oriented to person, place, and time. She appears well-developed and well-nourished.  Eyes: Conjunctivae are normal. No scleral icterus.  Neck: Neck supple.  Cardiovascular: Normal rate, regular rhythm and normal heart sounds.   Pulmonary/Chest: Effort normal and breath sounds normal. Right breast exhibits no inverted nipple, no mass, no nipple discharge, no skin change and no tenderness. Left breast exhibits no inverted nipple, no mass, no nipple discharge, no skin change and no tenderness.  B/l breast biopsy sites healing well with minimal ecchymosis, no signs of infection or masses.  Abdominal: Soft. Bowel sounds are normal. There is no tenderness.  Lymphadenopathy:    She has no cervical adenopathy.    She has no axillary adenopathy.  Neurological: She is alert and oriented to person, place, and time.  Skin: Skin is warm and dry.    Data Reviewed Progress notes, mammogram, and breast ultrasound.  Path- right invasive mammary CA, ER/PR positive, her 2 2+, FISH pending. Size of primary 0.84cm  Assessment    Right breast cancer, ER/PR and HER2 pending. Clinical Stage - T1b,N0,M0    Plan    Surgical options discussed (mastectomy vs lumpectomy) in full with patient. Patient agreeable to lumpectomy with SN biopsy. She is also agreeable to excision of left breast mass concurrently. Patient will need radiation therapy as well following the procedure. Right breast ultrasound few days prior to surgery to see if lesion is identifiable or whether she will  need mammographic wire loc.Marland Kitchen      Patient's surgery has been [facility] for 07-13-15 at [facility].  PCP:  Mackey Birchwood 07/06/2015, 5:32 PM

## 2015-07-13 NOTE — Progress Notes (Signed)
  Oncology Nurse Navigator Documentation    Navigator Encounter Type: Introductory phone call (Surgery) (07/13/15 1100) Patient Visit Type: Surgery;Follow-up;Initial (07/13/15 1100)   Barriers/Navigation Needs: Education (07/13/15 1100) Education: Newly Diagnosed Cancer Education (07/13/15 1100)              Time Spent with Patient: 60 (07/13/15 1100)  Spoke to patient on 07/07/15, and obtained history for Breast Case Conference.  Delivered Breast Cancer Treatment Handbook/folder with hospital services to Dr. Angie Fava office .  Patient had initial surgical consult that afternoon.  Met this morning in Breast Center prior to surgery.  Also met husband in Same Day Surgery waiting, and informed him of patient status.  Patient and husband are newlyweds, married in October.  Will phone tomorrow as follow-up.

## 2015-07-14 ENCOUNTER — Encounter: Payer: Self-pay | Admitting: General Surgery

## 2015-07-14 NOTE — Progress Notes (Signed)
  Oncology Nurse Navigator Documentation    Navigator Encounter Type: Telephone (Post-op) (07/14/15 1100) Patient Visit Type: Follow-up (07/14/15 1100)                    Time Spent with Patient: 15 (07/14/15 1100)   Patient's husband reports she is doing well post-op, resting.  Follow-up appointment with Dr. Jamal Collin next week.

## 2015-07-17 LAB — SURGICAL PATHOLOGY

## 2015-07-17 NOTE — Anesthesia Postprocedure Evaluation (Signed)
Anesthesia Post Note  Patient: SHAE SHEVCHENKO  Procedure(s) Performed: Procedure(s) (LRB): BREAST LUMPECTOMY WITH SENTINEL LYMPH NODE BX (Right) BREAST LUMPECTOMY (Left)  Patient location during evaluation: PACU Anesthesia Type: General Level of consciousness: awake and alert Pain management: pain level controlled Vital Signs Assessment: post-procedure vital signs reviewed and stable Respiratory status: spontaneous breathing, nonlabored ventilation, respiratory function stable and patient connected to nasal cannula oxygen Cardiovascular status: blood pressure returned to baseline and stable Postop Assessment: no signs of nausea or vomiting Anesthetic complications: no    Last Vitals:  Filed Vitals:   07/13/15 1600 07/13/15 1633  BP: 140/63 169/93  Pulse: 71   Temp:    Resp: 16     Last Pain:  Filed Vitals:   07/13/15 1642  PainSc: 3                  Molli Barrows

## 2015-07-18 ENCOUNTER — Telehealth: Payer: Self-pay | Admitting: General Surgery

## 2015-07-18 ENCOUNTER — Encounter: Payer: Self-pay | Admitting: *Deleted

## 2015-07-18 DIAGNOSIS — C50911 Malignant neoplasm of unspecified site of right female breast: Secondary | ICD-10-CM

## 2015-07-18 NOTE — Telephone Encounter (Signed)
Pt advised on path report-margins clear, lymph nodes negative.  Will discuss this further with her on her post op visit

## 2015-07-18 NOTE — Patient Instructions (Signed)
none

## 2015-07-18 NOTE — Progress Notes (Signed)
Order faxed to pathology and [facility] for mammaprint per Dr Jamal Collin.

## 2015-07-20 ENCOUNTER — Ambulatory Visit (INDEPENDENT_AMBULATORY_CARE_PROVIDER_SITE_OTHER): Payer: Medicare PPO | Admitting: General Surgery

## 2015-07-20 ENCOUNTER — Encounter: Payer: Self-pay | Admitting: General Surgery

## 2015-07-20 VITALS — BP 122/74 | HR 76 | Resp 12 | Ht 62.0 in | Wt 131.0 lb

## 2015-07-20 DIAGNOSIS — C50911 Malignant neoplasm of unspecified site of right female breast: Secondary | ICD-10-CM

## 2015-07-20 NOTE — Patient Instructions (Signed)
Patient to return on two weeks  

## 2015-07-20 NOTE — Progress Notes (Signed)
This is a 69 year old female here today for her post op rigfht breast lumpectomy with SN biopsy and excision left breast fibroadenoma done on 07/13/15.Patient states she is doing well. I have reviewed the history of present illness with the patient.   Incisions look clean and healing well. No seroma, or signs of infection Path, Margins clear. SN neg. Primary tumor size 65mm. PT1c,N0,M0.  Discussed all with pt. Have requested Mammoprint to see if she will benefit with chemo.   Patient to return in two weeks.  An appointment to be scheduled with Dr. Noreene Filbert at the [facility] in the near future for radiation  This information has been scribed by Gaspar Cola CMA.

## 2015-07-26 ENCOUNTER — Encounter: Payer: Self-pay | Admitting: Internal Medicine

## 2015-07-26 ENCOUNTER — Ambulatory Visit (INDEPENDENT_AMBULATORY_CARE_PROVIDER_SITE_OTHER): Payer: Medicare PPO | Admitting: Internal Medicine

## 2015-07-26 VITALS — BP 120/76 | HR 77 | Temp 98.0°F | Wt 131.0 lb

## 2015-07-26 DIAGNOSIS — R51 Headache: Secondary | ICD-10-CM

## 2015-07-26 DIAGNOSIS — H538 Other visual disturbances: Secondary | ICD-10-CM | POA: Diagnosis not present

## 2015-07-26 DIAGNOSIS — R5383 Other fatigue: Secondary | ICD-10-CM | POA: Diagnosis not present

## 2015-07-26 DIAGNOSIS — R519 Headache, unspecified: Secondary | ICD-10-CM

## 2015-07-26 LAB — COMPREHENSIVE METABOLIC PANEL
ALBUMIN: 4.1 g/dL (ref 3.5–5.2)
ALK PHOS: 50 U/L (ref 39–117)
ALT: 16 U/L (ref 0–35)
AST: 20 U/L (ref 0–37)
BUN: 9 mg/dL (ref 6–23)
CALCIUM: 9.3 mg/dL (ref 8.4–10.5)
CHLORIDE: 103 meq/L (ref 96–112)
CO2: 29 mEq/L (ref 19–32)
CREATININE: 0.87 mg/dL (ref 0.40–1.20)
GFR: 82.67 mL/min (ref >60.00)
Glucose, Bld: 100 mg/dL — ABNORMAL HIGH (ref 70–99)
POTASSIUM: 3.3 meq/L — AB (ref 3.5–5.1)
SODIUM: 141 meq/L (ref 135–145)
TOTAL PROTEIN: 6.9 g/dL (ref 6.0–8.3)
Total Bilirubin: 0.7 mg/dL (ref 0.2–1.2)

## 2015-07-26 LAB — CBC WITH DIFFERENTIAL/PLATELET
BASOS PCT: 0.6 % (ref 0.0–3.0)
Basophils Absolute: 0 10*3/uL (ref 0.0–0.1)
EOS PCT: 4.3 % (ref 0.0–5.0)
Eosinophils Absolute: 0.1 10*3/uL (ref 0.0–0.7)
HEMATOCRIT: 40.3 % (ref 36.0–46.0)
HEMOGLOBIN: 13.2 g/dL (ref 12.0–15.0)
LYMPHS PCT: 39.5 % (ref 12.0–46.0)
Lymphs Abs: 1.3 10*3/uL (ref 0.7–4.0)
MCHC: 32.7 g/dL (ref 30.0–36.0)
MCV: 88.5 fl (ref 78.0–100.0)
MONO ABS: 0.2 10*3/uL (ref 0.1–1.0)
MONOS PCT: 6.7 % (ref 3.0–12.0)
Neutro Abs: 1.7 10*3/uL (ref 1.4–7.7)
Neutrophils Relative %: 48.9 % (ref 43.0–77.0)
Platelets: 332 10*3/uL (ref 150.0–400.0)
RBC: 4.56 Mil/uL (ref 3.87–5.11)
RDW: 13.1 % (ref 11.5–15.5)
WBC: 3.4 10*3/uL — AB (ref 4.0–10.5)

## 2015-07-26 LAB — TSH: TSH: 0.97 u[IU]/mL (ref 0.35–4.50)

## 2015-07-26 LAB — VITAMIN D 25 HYDROXY (VIT D DEFICIENCY, FRACTURES): VITD: 29 ng/mL — ABNORMAL LOW (ref 30.00–100.00)

## 2015-07-26 LAB — HEMOGLOBIN A1C: HEMOGLOBIN A1C: 5.1 % (ref 4.6–6.5)

## 2015-07-26 LAB — VITAMIN B12: Vitamin B-12: 489 pg/mL (ref 211–911)

## 2015-07-26 NOTE — Patient Instructions (Signed)
Fatigue  Fatigue is feeling tired all of the time, a lack of energy, or a lack of motivation. Occasional or mild fatigue is often a normal response to activity or life in general. However, long-lasting (chronic) or extreme fatigue may indicate an underlying medical condition.  HOME CARE INSTRUCTIONS   Watch your fatigue for any changes. The following actions may help to lessen any discomfort you are feeling:  · Talk to your health care provider about how much sleep you need each night. Try to get the required amount every night.  · Take medicines only as directed by your health care provider.  · Eat a healthy and nutritious diet. Ask your health care provider if you need help changing your diet.  · Drink enough fluid to keep your urine clear or pale yellow.  · Practice ways of relaxing, such as yoga, meditation, massage therapy, or acupuncture.  · Exercise regularly.    · Change situations that cause you stress. Try to keep your work and personal routine reasonable.  · Do not abuse illegal drugs.  · Limit alcohol intake to no more than 1 drink per day for nonpregnant women and 2 drinks per day for men. One drink equals 12 ounces of beer, 5 ounces of wine, or 1½ ounces of hard liquor.  · Take a multivitamin, if directed by your health care provider.  SEEK MEDICAL CARE IF:   · Your fatigue does not get better.  · You have a fever.    · You have unintentional weight loss or gain.  · You have headaches.    · You have difficulty:      Falling asleep.    Sleeping throughout the night.  · You feel angry, guilty, anxious, or sad.     · You are unable to have a bowel movement (constipation).    · You skin is dry.     · Your legs or another part of your body is swollen.    SEEK IMMEDIATE MEDICAL CARE IF:   · You feel confused.    · Your vision is blurry.  · You feel faint or pass out.    · You have a severe headache.    · You have severe abdominal, pelvic, or back pain.    · You have chest pain, shortness of breath, or an  irregular or fast heartbeat.    · You are unable to urinate or you urinate less than normal.    · You develop abnormal bleeding, such as bleeding from the rectum, vagina, nose, lungs, or nipples.  · You vomit blood.     · You have thoughts about harming yourself or committing suicide.    · You are worried that you might harm someone else.       This information is not intended to replace advice given to you by your health care provider. Make sure you discuss any questions you have with your health care provider.     Document Released: 05/19/2007 Document Revised: 08/12/2014 Document Reviewed: 11/23/2013  Elsevier Interactive Patient Education ©2016 [employer]

## 2015-07-26 NOTE — Progress Notes (Signed)
Pre visit review using our clinic review tool, if applicable. No additional management support is needed unless otherwise documented below in the visit note. 

## 2015-07-26 NOTE — Progress Notes (Signed)
Subjective:    Patient ID: Sandra [name], female    DOB: May 13, 1946, 69 y.o.   MRN: [id number]  HPI  Pt presents to the clinic today with c/o fatigue, headache and blurred vision. This has been going on x 1 week. The headache is located in the left side of her forehead. She describes the pain as aching. She has had some associated vision changes and dizziness. She denies runny nose, nasal congestion, ear pain or sore throat. She did take Tramadol with improvement in her headache. She did change out her contacts to see if that would help her vision, but she reports it did not. Her last eye exam was 11/2014. She did start a new supplement around the same time her symptoms started. She does have HTN but her BP is controlled on Norvasc and HCTZ. Her BP today is 120/76. She has not had sick contacts that she is aware or.   Review of Systems      Past Medical History  Diagnosis Date  . Hypertension   . Hyperlipidemia   . Cancer [facility])     Current Outpatient Prescriptions  Medication Sig Dispense Refill  . Alfalfa 500 MG TABS Take 1 tablet by mouth 3 (three) times daily.    Marland Kitchen amLODipine (NORVASC) 5 MG tablet TAKE ONE TABLET BY MOUTH EVERY DAY (Patient taking differently: Take 5 mg by mouth every morning. TAKE ONE TABLET BY MOUTH EVERY DAY) 90 tablet 3  . Biotin (BIOTIN MAXIMUM STRENGTH) 10 MG TABS Take 1 tablet by mouth daily.    . hydrochlorothiazide (HYDRODIURIL) 25 MG tablet TAKE ONE TABLET BY MOUTH EVERY DAY 90 tablet 3  . Indole-3-Carbinol POWD 1-2 capsules by Does not apply route daily.    . Iodine, Kelp, (KELP PO) Take 1 capsule by mouth daily. 600 mg    . Multiple Vitamins-Minerals (HAIR SKIN AND NAILS FORMULA PO) Take 3 mg by mouth daily.    . potassium chloride (KLOR-CON) 20 MEQ packet Take 20 mEq by mouth 2 (two) times daily. 10 tablet 0  . pravastatin (PRAVACHOL) 10 MG tablet Take 1 tablet (10 mg total) by mouth daily. (Patient taking differently: Take 10 mg by mouth at  bedtime. ) 90 tablet 3  . traMADol (ULTRAM) 50 MG tablet Take 1 tablet (50 mg total) by mouth every 6 (six) hours as needed. 30 tablet 0  . vitamin A 8000 UNIT capsule Take 8,000 Units by mouth daily.    . vitamin E 1000 UNIT capsule Take 1,000 Units by mouth daily.     No current facility-administered medications for this visit.    Allergies  Allergen Reactions  . Lovastatin Anaphylaxis    Tongue swelling    Family History  Problem Relation Age of Onset  . Cancer Sister 39    breast cancer  . Colon cancer Neg Hx     Social History   Social History  . Marital Status: Married    Spouse Name: N/A  . Number of Children: N/A  . Years of Education: N/A   Occupational History  . Not on file.   Social History Main Topics  . Smoking status: Never Smoker   . Smokeless tobacco: Never Used  . Alcohol Use: No  . Drug Use: No  . Sexual Activity: Not on file   Other Topics Concern  . Not on file   Social History Narrative   End of life wishes (updated 01/2013)-   Daughter of wishes [facility])  Desires CPR.   Desires life support if reasonable.              Constitutional: Pt reports fatigue and headache. Denies fever, malaise, or abrupt weight changes.  HEENT: Pt reports blurred vision. Denies eye pain, eye redness, ear pain, ringing in the ears, wax buildup, runny nose, nasal congestion, bloody nose, or sore throat. Respiratory: Denies difficulty breathing, shortness of breath, cough or sputum production.   Cardiovascular: Denies chest pain, chest tightness, palpitations or swelling in the hands or feet.  Neurological: Pt reports slight dizziness. Denies difficulty with memory, difficulty with speech or problems with balance and coordination.    No other specific complaints in a complete review of systems (except as listed in HPI above).  Objective:   Physical Exam   BP 120/76 mmHg  Pulse 77  Temp(Src) 98 F (36.7 C) (Oral)  Wt 131 lb (59.421 kg)   SpO2 98% Wt Readings from Last 3 Encounters:  07/26/15 131 lb (59.421 kg)  07/20/15 131 lb (59.421 kg)  07/12/15 133 lb (60.328 kg)    General: Appears her stated age, well developed, well nourished in NAD. HEENT: Head: normal shape and size, no sinus tenderness noted; Eyes: sclera white, no icterus, conjunctiva pink, PERRLA and EOMs intact; Ears: bilateral cerumen impaction.  Neck:  No adenopathy noted.  Cardiovascular: Normal rate and rhythm. S1,S2 noted.  No murmur, rubs or gallops noted. . Pulmonary/Chest: Normal effort and positive vesicular breath sounds. No respiratory distress. No wheezes, rales or ronchi noted. .  Neurological: Alert and oriented. Coordination normal.    BMET    Component Value Date/Time   NA 141 07/13/2015 1234   K 3.4* 07/13/2015 1234   K 4.0 02/24/2012 0743   CL 103 04/05/2015 1000   CO2 31 04/05/2015 1000   GLUCOSE 98 07/13/2015 1234   BUN 13 04/05/2015 1000   CREATININE 0.93 04/05/2015 1000   CREATININE 1.0 05/09/2010   CALCIUM 9.0 04/05/2015 1000    Lipid Panel     Component Value Date/Time   CHOL 138 04/05/2015 1000   TRIG 31.0 04/05/2015 1000   HDL 59.70 04/05/2015 1000   CHOLHDL 2 04/05/2015 1000   VLDL 6.2 04/05/2015 1000   LDLCALC 72 04/05/2015 1000    CBC    Component Value Date/Time   WBC 3.0* 04/05/2015 1000   WBC 3.4* 11/08/2014 1059   RBC 4.24 04/05/2015 1000   RBC 4.54 11/08/2014 1059   HGB 13.6 07/13/2015 1234   HGB 13.4 11/08/2014 1059   HCT 40.0 07/13/2015 1234   HCT 39.8 11/08/2014 1059   PLT 326.0 04/05/2015 1000   PLT 318 11/08/2014 1059   MCV 88.3 04/05/2015 1000   MCV 88 11/08/2014 1059   MCH 29.5 11/08/2014 1059   MCHC 33.7 04/05/2015 1000   MCHC 33.6 11/08/2014 1059   RDW 12.7 04/05/2015 1000   RDW 12.7 11/08/2014 1059   LYMPHSABS 1.3 04/05/2015 1000   LYMPHSABS 1.3 11/08/2014 1059   MONOABS 0.2 04/05/2015 1000   MONOABS 0.3 11/08/2014 1059   EOSABS 0.2 04/05/2015 1000   EOSABS 0.2 11/08/2014 1059     BASOSABS 0.0 04/05/2015 1000   BASOSABS 0.0 11/08/2014 1059   BASOSABS 1 11/24/2013 1242    Hgb A1C Lab Results  Component Value Date   HGBA1C 5.1 03/28/2014        Assessment & Plan:   Fatigue, headache, blurred vision:  Exam benign Will check A1C to r/o DM HTN controlled on current meds-  no change Will check CBC, CMET, TSH, B12 and Vit D to assess for cause of fatigue Get some rest and drink plenty of fluids  Will follow up after labs, return precautions given

## 2015-08-01 ENCOUNTER — Telehealth: Payer: Self-pay | Admitting: Family Medicine

## 2015-08-01 NOTE — Telephone Encounter (Signed)
Pt would like Dr, Deborra Medina to call her.  She is having vision concerns and would like Dr. Deborra Medina to check her labwork.

## 2015-08-01 NOTE — Telephone Encounter (Signed)
Pt scheduled an appointment for 08/02/15 to have PCP address her concerns with vision and labs / lt

## 2015-08-02 ENCOUNTER — Encounter: Payer: Self-pay | Admitting: Family Medicine

## 2015-08-02 ENCOUNTER — Ambulatory Visit (INDEPENDENT_AMBULATORY_CARE_PROVIDER_SITE_OTHER): Payer: Medicare PPO | Admitting: Family Medicine

## 2015-08-02 ENCOUNTER — Encounter (INDEPENDENT_AMBULATORY_CARE_PROVIDER_SITE_OTHER): Payer: Self-pay

## 2015-08-02 VITALS — BP 138/78 | HR 74 | Temp 98.0°F | Wt 135.2 lb

## 2015-08-02 DIAGNOSIS — C50011 Malignant neoplasm of nipple and areola, right female breast: Secondary | ICD-10-CM

## 2015-08-02 DIAGNOSIS — H538 Other visual disturbances: Secondary | ICD-10-CM | POA: Diagnosis not present

## 2015-08-02 NOTE — Progress Notes (Signed)
Pre visit review using our clinic review tool, if applicable. No additional management support is needed unless otherwise documented below in the visit note. 

## 2015-08-02 NOTE — Progress Notes (Signed)
Subjective:   Patient ID: Sandra [name], female    DOB: Jan 13, 1946, 69 y.o.   MRN: [id number]  Sandra [name] is a pleasant 69 y.o. year old female who presents to clinic today with Blurred Vision and Headache  on 08/02/2015  HPI: Saw my partner, Webb Silversmith, on 12/21 for HA, fatigue and blurred vision. Note reviewed. These symptoms had been ongoing for a week at the time of the OV.  Tramadol had helped with her headache.  She changed her contacts to see if that would help her blurred vision but it did not. Last eye exam 11/2014. Was also normotensive at time of OV.  Labs done- CBC, CMET, TSH, VIt B12, Vit D, a1c.  Labs reassuring other than mild hypokalemia and low Vit D- both which have been restarted per result note.  Lab Results  Component Value Date   NA 141 07/26/2015   K 3.3* 07/26/2015   CL 103 07/26/2015   CO2 29 07/26/2015   She is here today because she is still having blurred vision. Blurred vision seems more around the periphery, bilateral.  No eye pain or floaters.  Recently diagnosed with breast CA- starts radiation tomorrow.  Current Outpatient Prescriptions on File Prior to Visit  Medication Sig Dispense Refill  . Alfalfa 500 MG TABS Take 1 tablet by mouth 3 (three) times daily.    Marland Kitchen amLODipine (NORVASC) 5 MG tablet TAKE ONE TABLET BY MOUTH EVERY DAY (Patient taking differently: Take 5 mg by mouth every morning. TAKE ONE TABLET BY MOUTH EVERY DAY) 90 tablet 3  . Biotin (BIOTIN MAXIMUM STRENGTH) 10 MG TABS Take 1 tablet by mouth daily.    . hydrochlorothiazide (HYDRODIURIL) 25 MG tablet TAKE ONE TABLET BY MOUTH EVERY DAY 90 tablet 3  . Indole-3-Carbinol POWD 1-2 capsules by Does not apply route daily.    . Iodine, Kelp, (KELP PO) Take 1 capsule by mouth daily. 600 mg    . Multiple Vitamins-Minerals (HAIR SKIN AND NAILS FORMULA PO) Take 3 mg by mouth daily.    . potassium chloride (KLOR-CON) 20 MEQ packet Take 20 mEq by mouth 2 (two) times daily. 10  tablet 0  . pravastatin (PRAVACHOL) 10 MG tablet Take 1 tablet (10 mg total) by mouth daily. (Patient taking differently: Take 10 mg by mouth at bedtime. ) 90 tablet 3  . traMADol (ULTRAM) 50 MG tablet Take 1 tablet (50 mg total) by mouth every 6 (six) hours as needed. 30 tablet 0  . vitamin A 8000 UNIT capsule Take 8,000 Units by mouth daily.    . vitamin E 1000 UNIT capsule Take 1,000 Units by mouth daily.     No current facility-administered medications on file prior to visit.    Allergies  Allergen Reactions  . Lovastatin Anaphylaxis    Tongue swelling    Past Medical History  Diagnosis Date  . Hypertension   . Hyperlipidemia   . Cancer [facility])     Past Surgical History  Procedure Laterality Date  . Abdominal hysterectomy  1987  . Fatty tumor Left 2013    side  . Bunionectomy Right 1993  . Breast biopsy Right     neg  . Breast biopsy Left     4 oclock, FIBROEPITHELIAL LESION FAVOR CELLULAR FIBROADENOMA  . Breast biopsy Left     2 oclock, CYSTIC APOCRINE METAPLASIA  . Breast biopsy Right 07/13/15    130 INVASIVE MAMMARY CARCINOMA, NO SPECIAL TYPE  . Breast lumpectomy with sentinel  lymph node biopsy Right 07/13/2015    Procedure: BREAST LUMPECTOMY WITH SENTINEL LYMPH NODE BX;  Surgeon: Christene Lye, MD;  Location: [facility] ORS;  Service: General;  Laterality: Right;  . Breast lumpectomy Left 07/13/2015    Procedure: BREAST LUMPECTOMY;  Surgeon: Christene Lye, MD;  Location: [facility] ORS;  Service: General;  Laterality: Left;    Family History  Problem Relation Age of Onset  . Cancer Sister 15    breast cancer  . Colon cancer Neg Hx     Social History   Social History  . Marital Status: Married    Spouse Name: N/A  . Number of Children: N/A  . Years of Education: N/A   Occupational History  . Not on file.   Social History Main Topics  . Smoking status: Never Smoker   . Smokeless tobacco: Never Used  . Alcohol Use: No  . Drug Use: No  . Sexual  Activity: Not on file   Other Topics Concern  . Not on file   Social History Narrative   End of life wishes (updated 01/2013)-   Daughter of wishes (Cherlyl Duwayne Heck)      Desires CPR.   Desires life support if reasonable.            The PMH, PSH, Social History, Family History, Medications, and allergies have been reviewed in [facility], and have been updated if relevant.    Review of Systems  Constitutional: Negative.   HENT: Negative.   Eyes: Positive for visual disturbance. Negative for photophobia, pain, discharge and redness.  Cardiovascular: Negative.   Gastrointestinal: Negative.   Musculoskeletal: Negative.   Neurological: Negative.   Hematological: Negative.   Psychiatric/Behavioral: Negative.   All other systems reviewed and are negative.      Objective:    BP 138/78 mmHg  Pulse 74  Temp(Src) 98 F (36.7 C) (Oral)  Wt 135 lb 4 oz (61.349 kg)  SpO2 98%   Physical Exam  Constitutional: She is oriented to person, place, and time. She appears well-developed and well-nourished. No distress.  HENT:  Head: Normocephalic.  Eyes: Conjunctivae are normal.  Cardiovascular: Normal rate.   Pulmonary/Chest: Effort normal.  Musculoskeletal: Normal range of motion.  Neurological: She is alert and oriented to person, place, and time. No cranial nerve deficit.  Skin: Skin is warm and dry. She is not diaphoretic.  Psychiatric: She has a normal mood and affect. Her behavior is normal. Judgment and thought content normal.  Nursing note and vitals reviewed.         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

## 2015-08-02 NOTE — Patient Instructions (Signed)
Great to see you. Happy New year! Please stop by to see Marion on your way out. 

## 2015-08-02 NOTE — Assessment & Plan Note (Signed)
Persistent without clear cause in lab work. Advised optho evaluation.  Referral placed today. The patient indicates understanding of these issues and agrees with the plan.

## 2015-08-03 ENCOUNTER — Ambulatory Visit
Admission: RE | Admit: 2015-08-03 | Discharge: 2015-08-03 | Disposition: A | Discharge status: Home / Self Care | Payer: Medicare PPO | Source: Ambulatory Visit | Attending: Radiation Oncology | Admitting: Radiation Oncology

## 2015-08-03 ENCOUNTER — Encounter: Payer: Self-pay | Admitting: Radiation Oncology

## 2015-08-03 ENCOUNTER — Ambulatory Visit (INDEPENDENT_AMBULATORY_CARE_PROVIDER_SITE_OTHER): Payer: Medicare PPO | Admitting: General Surgery

## 2015-08-03 ENCOUNTER — Encounter: Payer: Self-pay | Admitting: General Surgery

## 2015-08-03 VITALS — BP 110/70 | HR 74 | Resp 12 | Ht 62.0 in | Wt 130.0 lb

## 2015-08-03 VITALS — BP 135/87 | HR 77 | Temp 97.3°F | Resp 20 | Ht 62.0 in | Wt 131.3 lb

## 2015-08-03 DIAGNOSIS — C50911 Malignant neoplasm of unspecified site of right female breast: Secondary | ICD-10-CM

## 2015-08-03 DIAGNOSIS — C50211 Malignant neoplasm of upper-inner quadrant of right female breast: Secondary | ICD-10-CM | POA: Diagnosis not present

## 2015-08-03 NOTE — Consult Note (Signed)
Except an outstanding is perfect of Radiation Oncology NEW PATIENT EVALUATION  Name: Sandra [name]  MRN: [id number]  Date:   08/03/2015     DOB: 04-22-1946   This 69 y.o. female patient presents to the clinic for initial evaluation of breast cancer right breast stage IA (T1 CN 0 M0) status post wide local excision and sentinel node biopsy ER/PR positive HER-2/neu negative with high risk of recurrence on mamoprint. REFERRING PHYSICIAN: Lucille Passy, MD  CHIEF COMPLAINT:  Chief Complaint  Patient presents with  . Breast Cancer    Pt is here for initial consultation of breast cancer.      DIAGNOSIS: The encounter diagnosis was Malignant neoplasm of right female breast, unspecified site of breast ([place]).   PREVIOUS INVESTIGATIONS:  Mammograms and ultrasound reviewed Pathology report reviewed Clinical notes reviewed  HPI: Patient is a pleasant 69 year old female who presented with an abnormal mammogram of the right breast showing a spiculated mass in the upper inner quadrant which was suspicious for malignancy. She also had a lesion in her left breast circumcised mass at 4:00 position. Both were biopsied left breast was benign right breast showed invasive mammary carcinoma ER/PR positive HER-2/neu negative. Patient underwent a wide local excision and sentinel node biopsy showing 3 lymph nodes negative for metastatic disease tumor was 1.6 cm. Margins were clear at 3 mm. Tumor was overall grade 2. Patient tolerated her surgery well. mamoprint was performed although I have not seen official report of discuss it with Dr. Jamal Collin and shows high risk of recurrence. She is scheduled to see medical oncology next week she is doing well she specifically denies breast tenderness cough or bone pain.  PLANNED TREATMENT REGIMEN: Adjuvant chemotherapy plus whole breast radiation  PAST MEDICAL HISTORY:  has a past medical history of Hypertension; Hyperlipidemia; and Cancer ([place]).    PAST SURGICAL  HISTORY:  Past Surgical History  Procedure Laterality Date  . Abdominal hysterectomy  1987  . Fatty tumor Left 2013    side  . Bunionectomy Right 1993  . Breast biopsy Right     neg  . Breast biopsy Left     4 oclock, FIBROEPITHELIAL LESION FAVOR CELLULAR FIBROADENOMA  . Breast biopsy Left     2 oclock, CYSTIC APOCRINE METAPLASIA  . Breast biopsy Right 07/13/15    130 INVASIVE MAMMARY CARCINOMA, NO SPECIAL TYPE  . Breast lumpectomy with sentinel lymph node biopsy Right 07/13/2015    Procedure: BREAST LUMPECTOMY WITH SENTINEL LYMPH NODE BX;  Surgeon: Christene Lye, MD;  Location: [facility] ORS;  Service: General;  Laterality: Right;  . Breast lumpectomy Left 07/13/2015    Procedure: BREAST LUMPECTOMY;  Surgeon: Christene Lye, MD;  Location: [facility] ORS;  Service: General;  Laterality: Left;    FAMILY HISTORY: family history includes Cancer (age of onset: 17) in her sister. There is no history of Colon cancer.  SOCIAL HISTORY:  reports that she has never smoked. She has never used smokeless tobacco. She reports that she does not drink alcohol or use illicit drugs.  ALLERGIES: Lovastatin  MEDICATIONS:  Current Outpatient Prescriptions  Medication Sig Dispense Refill  . Alfalfa 500 MG TABS Take 1 tablet by mouth 3 (three) times daily.    Marland Kitchen amLODipine (NORVASC) 5 MG tablet TAKE ONE TABLET BY MOUTH EVERY DAY (Patient taking differently: Take 5 mg by mouth every morning. TAKE ONE TABLET BY MOUTH EVERY DAY) 90 tablet 3  . Biotin (BIOTIN MAXIMUM STRENGTH) 10 MG TABS Take 1 tablet  by mouth daily.    . hydrochlorothiazide (HYDRODIURIL) 25 MG tablet TAKE ONE TABLET BY MOUTH EVERY DAY 90 tablet 3  . Indole-3-Carbinol POWD 1-2 capsules by Does not apply route daily.    . Iodine, Kelp, (KELP PO) Take 1 capsule by mouth daily. 600 mg    . Multiple Vitamins-Minerals (HAIR SKIN AND NAILS FORMULA PO) Take 3 mg by mouth daily.    . potassium chloride (KLOR-CON) 20 MEQ packet Take 20 mEq by  mouth 2 (two) times daily. 10 tablet 0  . pravastatin (PRAVACHOL) 10 MG tablet Take 1 tablet (10 mg total) by mouth daily. (Patient taking differently: Take 10 mg by mouth at bedtime. ) 90 tablet 3  . traMADol (ULTRAM) 50 MG tablet Take 1 tablet (50 mg total) by mouth every 6 (six) hours as needed. 30 tablet 0  . vitamin A 8000 UNIT capsule Take 8,000 Units by mouth daily.    . vitamin E 1000 UNIT capsule Take 1,000 Units by mouth daily.     No current facility-administered medications for this encounter.    ECOG PERFORMANCE STATUS:  0 - Asymptomatic  REVIEW OF SYSTEMS:  Patient denies any weight loss, fatigue, weakness, fever, chills or night sweats. Patient denies any loss of vision, blurred vision. Patient denies any ringing  of the ears or hearing loss. No irregular heartbeat. Patient denies heart murmur or history of fainting. Patient denies any chest pain or pain radiating to her upper extremities. Patient denies any shortness of breath, difficulty breathing at night, cough or hemoptysis. Patient denies any swelling in the lower legs. Patient denies any nausea vomiting, vomiting of blood, or coffee ground material in the vomitus. Patient denies any stomach pain. Patient states has had normal bowel movements no significant constipation or diarrhea. Patient denies any dysuria, hematuria or significant nocturia. Patient denies any problems walking, swelling in the joints or loss of balance. Patient denies any skin changes, loss of hair or loss of weight. Patient denies any excessive worrying or anxiety or significant depression. Patient denies any problems with insomnia. Patient denies excessive thirst, polyuria, polydipsia. Patient denies any swollen glands, patient denies easy bruising or easy bleeding. Patient denies any recent infections, allergies or URI. Patient "s visual fields have not changed significantly in recent time.    PHYSICAL EXAM: BP 135/87 mmHg  Pulse 77  Temp(Src) 97.3 F  (36.3 C)  Resp 20  Ht '5\' 2"'$  (1.575 m)  Wt 131 lb 4.5 oz (59.55 kg)  BMI 24.01 kg/m2 She is status post wide local excision of the right breast. Incisions are healed well. No dominant mass or nodularity is noted in either breast in 2 positions examined. No axillary or supraclavicular adenopathy is appreciated. Well-developed well-nourished patient in NAD. HEENT reveals PERLA, EOMI, discs not visualized.  Oral cavity is clear. No oral mucosal lesions are identified. Neck is clear without evidence of cervical or supraclavicular adenopathy. Lungs are clear to A&P. Cardiac examination is essentially unremarkable with regular rate and rhythm without murmur rub or thrill. Abdomen is benign with no organomegaly or masses noted. Motor sensory and DTR levels are equal and symmetric in the upper and lower extremities. Cranial nerves II through XII are grossly intact. Proprioception is intact. No peripheral adenopathy or edema is identified. No motor or sensory levels are noted. Crude visual fields are within normal range.  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 I've also discussed risks and benefits of radiation therapy I would recommend whole breast radiation and treat in a hypofractionated course over 4 weeks. Would also boost her scar another 1400 cGy using electron beam. Risks and benefits of treatment including skin reaction, fatigue, alteration of blood counts, possible inclusion of superficial lung all were discussed in detail with the patient. We'll see her in follow-up after completion of chemotherapy. Patient is amenable to having chemotherapy. Case was personally  discussed with surgeon.  I would like to take this opportunity for allowing me to participate in the care of your patient.Armstead Peaks., MD

## 2015-08-03 NOTE — Progress Notes (Signed)
This is a 69 year old female here today for her post op right breast lumpectomy with SN biopsy and excision left breast fibroadenoma done on 07/13/15.Patient states she is doing well. I have reviewed the history of present illness with the patient.  Bilateral breast incisions are  clean and healing well. Mammoprint showed high risk. She will likely benefit with chemo. Discussed port placement with patient, she agrees Patient to see Dr. Jeb Levering on 08/14/15. If he agrees with chemo will schedule port placement  Patient's surgery has been scheduled for 08-16-15 at [facility].  This information has been scribed by Gaspar Cola CMA. PCP:  Arnette Norris

## 2015-08-03 NOTE — Patient Instructions (Addendum)
Implanted Port Insertion An implanted port is a central line that has a round shape and is placed under the skin. It is used as a long-term IV access for:   Medicines, such as chemotherapy.   Fluids.   Liquid nutrition, such as total parenteral nutrition (TPN).   Blood samples.  LET YOUR HEALTH CARE PROVIDER KNOW ABOUT:  Allergies to food or medicine.   Medicines taken, including vitamins, herbs, eye drops, creams, and over-the-counter medicines.   Any allergies to heparin.  Use of steroids (by mouth or creams).   Previous problems with anesthetics or numbing medicines.   History of bleeding problems or blood clots.   Previous surgery.   Other health problems, including diabetes and kidney problems.   Possibility of pregnancy, if this applies. RISKS AND COMPLICATIONS Generally, this is a safe procedure. However, as with any procedure, problems can occur. Possible problems include:  Damage to the blood vessel, bruising, or bleeding at the puncture site.   Infection.  Blood clot in the vessel that the port is in.  Breakdown of the skin over your port.  Very rarely a person may develop a condition called a pneumothorax, a collection of air in the chest that may cause one of the lungs to collapse. The placement of these catheters with the appropriate imaging guidance significantly decreases the risk of a pneumothorax.  BEFORE THE PROCEDURE   Your health care provider may want you to have blood tests. These tests can help tell how well your kidneys and liver are working. They can also show how well your blood clots.   If you take blood thinners (anticoagulant medicines), ask your health care provider when you should stop taking them.   Make arrangements for someone to drive you home. This is necessary if you have been sedated for your procedure.  PROCEDURE  Port insertion usually takes about 30-45 minutes.   An IV needle will be inserted in your arm.  Medicine for pain and medicine to help relax you (sedative) will flow directly into your body through this needle.   You will lie on an exam table, and you will be connected to monitors to keep track of your heart rate, blood pressure, and breathing throughout the procedure.  An oxygen monitoring device may be attached to your finger. Oxygen will be given.   Everything will be kept as germ free (sterile) as possible during the procedure. The skin near the point of the incision will be cleansed with antiseptic, and the area will be draped with sterile towels. The skin and deeper tissues over the port area will be made numb with a local anesthetic.  Two small cuts (incisions) will be made in the skin to insert the port. One will be made in the neck to obtain access to the vein where the catheter will lie.   Because the port reservoir will be placed under the skin, a small skin incision will be made in the upper chest, and a small pocket for the port will be made under the skin. The catheter that will be connected to the port tunnels to a large central vein in the chest. A small, raised area will remain on your body at the site of the reservoir when the procedure is complete.  The port placement will be done under imaging guidance to ensure the proper placement.  The reservoir has a silicone covering that can be punctured with a special needle.   The port will be flushed with normal   saline, and blood will be drawn to make sure it is working properly.  There will be nothing remaining outside the skin when the procedure is finished.   Incisions will be held together by stitches, surgical glue, or a special tape. AFTER THE PROCEDURE  You will stay in a recovery area until the anesthesia has worn off. Your blood pressure and pulse will be checked.  A final chest X-ray will be taken to check the placement of the port and to ensure that there is no injury to your lung.   This information is  not intended to replace advice given to you by your health care provider. Make sure you discuss any questions you have with your health care provider.   Document Released: 05/12/2013 Document Revised: 08/12/2014 Document Reviewed: 05/12/2013 Elsevier Interactive Patient Education 2016 [employer].  Patient's surgery has been scheduled for 08-16-15 at [facility].

## 2015-08-03 NOTE — Addendum Note (Signed)
Addended by: Christene Lye on: 08/03/2015 02:57 PM   Modules accepted: Orders

## 2015-08-04 DIAGNOSIS — H2513 Age-related nuclear cataract, bilateral: Secondary | ICD-10-CM | POA: Diagnosis not present

## 2015-08-06 HISTORY — PX: PORT-A-CATH REMOVAL: SHX5289

## 2015-08-09 ENCOUNTER — Encounter: Payer: Self-pay | Admitting: *Deleted

## 2015-08-09 ENCOUNTER — Other Ambulatory Visit: Payer: Medicare PPO

## 2015-08-09 NOTE — Patient Instructions (Signed)
  Your procedure is scheduled on: 08-16-15 [facility]) Report to [place] To find out your arrival time please call [phone] between 1PM - 3PM on 08-15-15 (TUESDAY)  Remember: Instructions that are not followed completely may result in serious medical risk, up to and including death, or upon the discretion of your surgeon and anesthesiologist your surgery may need to be rescheduled.    _X___ 1. Do not eat food or drink liquids after midnight. No gum chewing or hard candies.     _X___ 2. No Alcohol for 24 hours before or after surgery.   ____ 3. Bring all medications with you on the day of surgery if instructed.    _X___ 4. Notify your doctor if there is any change in your medical condition     (cold, fever, infections).     Do not wear jewelry, make-up, hairpins, clips or nail polish.  Do not wear lotions, powders, or perfumes. You may wear deodorant.  Do not shave 48 hours prior to surgery. Men may shave face and neck.  Do not bring valuables to the hospital.    [facility] is not responsible for any belongings or valuables.               Contacts, dentures or bridgework may not be worn into surgery.  Leave your suitcase in the car. After surgery it may be brought to your room.  For patients admitted to the hospital, discharge time is determined by your treatment team.   Patients discharged the day of surgery will not be allowed to drive home.   Please read over the following fact sheets that you were given:      _X___ Take these medicines the morning of surgery with A SIP OF WATER:    1. AMLODIPINE (NORVASC)  2.   3.   4.  5.  6.  ____ Fleet Enema (as directed)   _X___ Use CHG Soap as directed  ____ Use inhalers on the day of surgery  ____ Stop metformin 2 days prior to surgery    ____ Take 1/2 of usual insulin dose the night before surgery and none on the morning of surgery.   ____ Stop Coumadin/Plavix/aspirin-N/A  ____ Stop  Anti-inflammatories-NO NSAIDS OR ASPIRIN PRODUCTS-TRAMADOL OK TO CONTINUE/TYLENOL OK TO TAKE   ____ Stop supplements until after surgery.    ____ Bring C-Pap to the hospital.

## 2015-08-10 ENCOUNTER — Telehealth: Payer: Self-pay | Admitting: *Deleted

## 2015-08-10 ENCOUNTER — Encounter
Admission: RE | Admit: 2015-08-10 | Discharge: 2015-08-10 | Disposition: A | Discharge status: Home / Self Care | Payer: Medicare Other | Source: Ambulatory Visit | Attending: General Surgery | Admitting: General Surgery

## 2015-08-10 DIAGNOSIS — Z01812 Encounter for preprocedural laboratory examination: Secondary | ICD-10-CM | POA: Insufficient documentation

## 2015-08-10 LAB — POTASSIUM: POTASSIUM: 3 mmol/L — AB (ref 3.5–5.1)

## 2015-08-10 NOTE — Telephone Encounter (Signed)
-----   Message from Christene Lye, MD sent at 08/10/2015 10:47 AM EST ----- Have pt take the potassium twice a day till surgery

## 2015-08-10 NOTE — Pre-Procedure Instructions (Signed)
Called Selinda Eon at Dr Angie Fava office to inform her of patients low K+ of 3.0- also faxed lab result to Dr Jacalyn Lefevre office and received fax confirmation

## 2015-08-10 NOTE — Telephone Encounter (Signed)
Per Nira Conn in Pre-admission Testing, patient's potassium that was drawn today was 3.0 mmol/L. Patient is currently on potassium 20 meq once daily.   This patient is currently scheduled for port placement on 08-16-15 at [facility].

## 2015-08-10 NOTE — Telephone Encounter (Signed)
Patient notified as instructed and she verbalizes understanding.  

## 2015-08-14 ENCOUNTER — Inpatient Hospital Stay: Payer: Medicare PPO | Admitting: Oncology

## 2015-08-16 ENCOUNTER — Ambulatory Visit: Payer: Medicare Other | Admitting: Anesthesiology

## 2015-08-16 ENCOUNTER — Ambulatory Visit: Payer: Medicare Other

## 2015-08-16 ENCOUNTER — Ambulatory Visit
Admission: RE | Admit: 2015-08-16 | Discharge: 2015-08-16 | Disposition: A | Discharge status: Home / Self Care | Payer: Medicare Other | Source: Ambulatory Visit | Attending: General Surgery | Admitting: General Surgery

## 2015-08-16 ENCOUNTER — Encounter
Admission: RE | Disposition: A | Discharge status: Home / Self Care | Payer: Self-pay | Source: Ambulatory Visit | Attending: General Surgery

## 2015-08-16 ENCOUNTER — Encounter: Payer: Self-pay | Admitting: *Deleted

## 2015-08-16 DIAGNOSIS — Z888 Allergy status to other drugs, medicaments and biological substances status: Secondary | ICD-10-CM | POA: Insufficient documentation

## 2015-08-16 DIAGNOSIS — Z79899 Other long term (current) drug therapy: Secondary | ICD-10-CM | POA: Insufficient documentation

## 2015-08-16 DIAGNOSIS — Z9889 Other specified postprocedural states: Secondary | ICD-10-CM | POA: Diagnosis not present

## 2015-08-16 DIAGNOSIS — E785 Hyperlipidemia, unspecified: Secondary | ICD-10-CM | POA: Insufficient documentation

## 2015-08-16 DIAGNOSIS — C50911 Malignant neoplasm of unspecified site of right female breast: Secondary | ICD-10-CM | POA: Diagnosis not present

## 2015-08-16 DIAGNOSIS — Z87892 Personal history of anaphylaxis: Secondary | ICD-10-CM | POA: Diagnosis not present

## 2015-08-16 DIAGNOSIS — Z803 Family history of malignant neoplasm of breast: Secondary | ICD-10-CM | POA: Insufficient documentation

## 2015-08-16 DIAGNOSIS — Z95828 Presence of other vascular implants and grafts: Secondary | ICD-10-CM

## 2015-08-16 DIAGNOSIS — I10 Essential (primary) hypertension: Secondary | ICD-10-CM | POA: Diagnosis not present

## 2015-08-16 DIAGNOSIS — Z90710 Acquired absence of both cervix and uterus: Secondary | ICD-10-CM | POA: Insufficient documentation

## 2015-08-16 HISTORY — DX: Other visual disturbances: H53.8

## 2015-08-16 HISTORY — PX: PORTACATH PLACEMENT: SHX2246

## 2015-08-16 LAB — POCT I-STAT 4, (NA,K, GLUC, HGB,HCT)
GLUCOSE: 109 mg/dL — AB (ref 65–99)
HCT: 39 % (ref 36.0–46.0)
Hemoglobin: 13.3 g/dL (ref 12.0–15.0)
Potassium: 3.3 mmol/L — ABNORMAL LOW (ref 3.5–5.1)
Sodium: 141 mmol/L (ref 135–145)

## 2015-08-16 SURGERY — INSERTION, TUNNELED CENTRAL VENOUS DEVICE, WITH PORT
Anesthesia: Monitor Anesthesia Care | Site: Chest | Laterality: Left

## 2015-08-16 MED ORDER — LIDOCAINE HCL (PF) 1 % IJ SOLN
INTRAMUSCULAR | Status: AC
  Filled 2015-08-16: qty 30

## 2015-08-16 MED ORDER — BUPIVACAINE HCL (PF) 0.5 % IJ SOLN
INTRAMUSCULAR | Status: AC
  Filled 2015-08-16: qty 30

## 2015-08-16 MED ORDER — PHENYLEPHRINE HCL 10 MG/ML IJ SOLN
INTRAMUSCULAR | Status: DC | PRN
  Administered 2015-08-16: 100 ug via INTRAVENOUS
  Administered 2015-08-16: 150 ug via INTRAVENOUS
  Administered 2015-08-16 (×2): 100 ug via INTRAVENOUS

## 2015-08-16 MED ORDER — TRAMADOL HCL 50 MG PO TABS: 50.0000 mg | ORAL_TABLET | Freq: Four times a day (QID) | ORAL | Status: DC | PRN

## 2015-08-16 MED ORDER — MIDAZOLAM HCL 2 MG/2ML IJ SOLN
INTRAMUSCULAR | Status: DC | PRN
  Administered 2015-08-16: 2 mg via INTRAVENOUS

## 2015-08-16 MED ORDER — ACETAMINOPHEN 10 MG/ML IV SOLN
INTRAVENOUS | Status: DC | PRN
  Administered 2015-08-16: 1000 mg via INTRAVENOUS

## 2015-08-16 MED ORDER — FENTANYL CITRATE (PF) 100 MCG/2ML IJ SOLN: 25.0000 ug | INTRAMUSCULAR | Status: DC | PRN

## 2015-08-16 MED ORDER — ACETAMINOPHEN 10 MG/ML IV SOLN
INTRAVENOUS | Status: AC
  Filled 2015-08-16: qty 100

## 2015-08-16 MED ORDER — LACTATED RINGERS IV SOLN
INTRAVENOUS | Status: DC
  Administered 2015-08-16 (×4): via INTRAVENOUS

## 2015-08-16 MED ORDER — FENTANYL CITRATE (PF) 100 MCG/2ML IJ SOLN
INTRAMUSCULAR | Status: DC | PRN
  Administered 2015-08-16: 50 ug via INTRAVENOUS

## 2015-08-16 MED ORDER — CHLORHEXIDINE GLUCONATE 4 % EX LIQD: 1.0000 "application " | Freq: Once | CUTANEOUS | Status: DC

## 2015-08-16 MED ORDER — BUPIVACAINE HCL 0.5 % IJ SOLN
INTRAMUSCULAR | Status: DC | PRN
  Administered 2015-08-16: 4 mL
  Administered 2015-08-16: 10 mL

## 2015-08-16 MED ORDER — HEPARIN SODIUM (PORCINE) 5000 UNIT/ML IJ SOLN
INTRAMUSCULAR | Status: AC
  Filled 2015-08-16: qty 1

## 2015-08-16 MED ORDER — OXYCODONE HCL 5 MG/5ML PO SOLN: 5.0000 mg | Freq: Once | ORAL | Status: DC | PRN

## 2015-08-16 MED ORDER — CEFAZOLIN SODIUM-DEXTROSE 2-3 GM-% IV SOLR
2.0000 g | INTRAVENOUS | Status: AC
  Administered 2015-08-16 (×2): 2 g via INTRAVENOUS

## 2015-08-16 MED ORDER — CEFAZOLIN SODIUM-DEXTROSE 2-3 GM-% IV SOLR
INTRAVENOUS | Status: AC
  Administered 2015-08-16: 2 g via INTRAVENOUS
  Filled 2015-08-16: qty 50

## 2015-08-16 MED ORDER — PROPOFOL 10 MG/ML IV BOLUS
INTRAVENOUS | Status: DC | PRN
  Administered 2015-08-16 (×4): 10 mg via INTRAVENOUS

## 2015-08-16 MED ORDER — FAMOTIDINE 20 MG PO TABS
ORAL_TABLET | ORAL | Status: AC
  Filled 2015-08-16: qty 1

## 2015-08-16 MED ORDER — LACTATED RINGERS IV SOLN
INTRAVENOUS | Status: DC | PRN
  Administered 2015-08-16: 10:00:00 via INTRAVENOUS

## 2015-08-16 MED ORDER — OXYCODONE HCL 5 MG PO TABS: 5.0000 mg | ORAL_TABLET | Freq: Once | ORAL | Status: DC | PRN

## 2015-08-16 MED ORDER — PROPOFOL 500 MG/50ML IV EMUL
INTRAVENOUS | Status: DC | PRN
  Administered 2015-08-16: 50 ug/kg/min via INTRAVENOUS
  Administered 2015-08-16: 25 ug/kg/min via INTRAVENOUS

## 2015-08-16 MED ORDER — FAMOTIDINE 20 MG PO TABS
20.0000 mg | ORAL_TABLET | Freq: Once | ORAL | Status: AC
  Administered 2015-08-16: 20 mg via ORAL

## 2015-08-16 SURGICAL SUPPLY — 29 items
BAG DECANTER FOR FLEXI CONT (MISCELLANEOUS) ×3 IMPLANT
BLADE SURG 15 STRL SS SAFETY (BLADE) ×3 IMPLANT
CANISTER SUCT 1200ML W/VALVE (MISCELLANEOUS) ×3 IMPLANT
CHLORAPREP W/TINT 26ML (MISCELLANEOUS) ×3 IMPLANT
COVER LIGHT HANDLE STERIS (MISCELLANEOUS) ×6 IMPLANT
DECANTER SPIKE VIAL GLASS SM (MISCELLANEOUS) ×6 IMPLANT
DRAPE C-ARM XRAY 36X54 (DRAPES) ×3 IMPLANT
GLOVE BIO SURGEON STRL SZ7 (GLOVE) ×3 IMPLANT
GOWN STRL REUS W/ TWL LRG LVL3 (GOWN DISPOSABLE) ×2 IMPLANT
GOWN STRL REUS W/TWL LRG LVL3 (GOWN DISPOSABLE) ×6
IV NS 500ML (IV SOLUTION) ×3
IV NS 500ML BAXH (IV SOLUTION) ×1 IMPLANT
KIT PORT POWER 8FR ISP CVUE (Catheter) ×3 IMPLANT
KIT RM TURNOVER STRD PROC AR (KITS) ×3 IMPLANT
LABEL OR SOLS (LABEL) ×3 IMPLANT
LIQUID BAND (GAUZE/BANDAGES/DRESSINGS) ×3 IMPLANT
NDL FILTER BLUNT 18X1 1/2 (NEEDLE) ×1 IMPLANT
NDL HYPO 25GX1X1/2 BEV (NEEDLE) ×1 IMPLANT
NEEDLE FILTER BLUNT 18X 1/2SAF (NEEDLE) ×2
NEEDLE FILTER BLUNT 18X1 1/2 (NEEDLE) ×1 IMPLANT
NEEDLE HYPO 25GX1X1/2 BEV (NEEDLE) ×3 IMPLANT
NS IRRIG 500ML POUR BTL (IV SOLUTION) ×3 IMPLANT
PACK PORT-A-CATH (MISCELLANEOUS) ×3 IMPLANT
PAD GROUND ADULT SPLIT (MISCELLANEOUS) ×3 IMPLANT
SUT PROLENE 2 0 SH DA (SUTURE) ×3 IMPLANT
SUT VIC AB 3-0 SH 27 (SUTURE) ×3
SUT VIC AB 3-0 SH 27X BRD (SUTURE) ×1 IMPLANT
SUT VIC AB 4-0 FS2 27 (SUTURE) ×3 IMPLANT
SYR 3ML LL SCALE MARK (SYRINGE) ×3 IMPLANT

## 2015-08-16 NOTE — H&P (Signed)
Sandra [name] is an 70 y.o. female.   Chief Complaint: Here for port placement HPI: Healthy pt with recent treatment for right breast invasive cancer, ER/PR pos,Her 2 neg. Mammoprint  Revealed a high risk recurrence score and therefore chemo is being planned.   Past Medical History  Diagnosis Date  . Hypertension   . Hyperlipidemia   . Cancer ([place])   . Visual blurriness     SOMETHING NEW THAT HAS DEVELOPED SINCE 07-13-15 SURGERY- SEEN AT [facility] AND EXAM WAS NORMAL PER PT (08-09-15)    Past Surgical History  Procedure Laterality Date  . Abdominal hysterectomy  1987  . Fatty tumor Left 2013    side  . Bunionectomy Right 1993  . Breast biopsy Right     neg  . Breast biopsy Left     4 oclock, FIBROEPITHELIAL LESION FAVOR CELLULAR FIBROADENOMA  . Breast biopsy Left     2 oclock, CYSTIC APOCRINE METAPLASIA  . Breast biopsy Right 07/13/15    130 INVASIVE MAMMARY CARCINOMA, NO SPECIAL TYPE  . Breast lumpectomy with sentinel lymph node biopsy Right 07/13/2015    Procedure: BREAST LUMPECTOMY WITH SENTINEL LYMPH NODE BX;  Surgeon: Christene Lye, MD;  Location: [facility] ORS;  Service: General;  Laterality: Right;  . Breast lumpectomy Left 07/13/2015    Procedure: BREAST LUMPECTOMY;  Surgeon: Christene Lye, MD;  Location: [facility] ORS;  Service: General;  Laterality: Left;    Family History  Problem Relation Age of Onset  . Cancer Sister 26    breast cancer  . Colon cancer Neg Hx    Social History:  reports that she has never smoked. She has never used smokeless tobacco. She reports that she does not drink alcohol or use illicit drugs.  Allergies:  Allergies  Allergen Reactions  . Lovastatin Anaphylaxis    Tongue swelling    Medications Prior to Admission  Medication Sig Dispense Refill  . amLODipine (NORVASC) 5 MG tablet TAKE ONE TABLET BY MOUTH EVERY DAY (Patient taking differently: Take 5 mg by mouth every morning. TAKE ONE TABLET BY MOUTH EVERY DAY) 90 tablet 3   . cholecalciferol (VITAMIN D) 1000 units tablet Take 1,000 Units by mouth daily.    . hydrochlorothiazide (HYDRODIURIL) 25 MG tablet TAKE ONE TABLET BY MOUTH EVERY DAY 90 tablet 3  . Potassium Chloride ER 20 MEQ TBCR Take 1 tablet by mouth daily.    . pravastatin (PRAVACHOL) 10 MG tablet Take 1 tablet (10 mg total) by mouth daily. (Patient taking differently: Take 10 mg by mouth at bedtime. ) 90 tablet 3  . traMADol (ULTRAM) 50 MG tablet Take 1 tablet (50 mg total) by mouth every 6 (six) hours as needed. 30 tablet 0    Results for orders placed or performed during the hospital encounter of 08/16/15 (from the past 48 hour(s))  I-STAT 4, (NA,K, GLUC, HGB,HCT)     Status: Abnormal   Collection Time: 08/16/15 10:02 AM  Result Value Ref Range   Sodium 141 135 - 145 mmol/L   Potassium 3.3 (L) 3.5 - 5.1 mmol/L   Glucose, Bld 109 (H) 65 - 99 mg/dL   HCT 39.0 36.0 - 46.0 %   Hemoglobin 13.3 12.0 - 15.0 g/dL   No results found.  Review of Systems  Constitutional: Negative.   HENT: Negative.   Respiratory: Negative.   Cardiovascular: Negative.   Gastrointestinal: Negative.   Genitourinary: Negative.     Blood pressure 136/86, pulse 83, temperature 95.5 F (  35.3 C), temperature source Tympanic, resp. rate 16, height 5\' 2"  (1.575 m), weight 132 lb (59.875 kg), SpO2 100 %. Physical Exam  Constitutional: She appears well-developed and well-nourished.  Eyes: Conjunctivae are normal. No scleral icterus.  Neck: Neck supple.  Cardiovascular: Normal rate, regular rhythm and normal heart sounds.   Respiratory: Effort normal and breath sounds normal.  Healed incisions in right breast and axilla  GI: Soft. She exhibits no mass. There is no tenderness.  Lymphadenopathy:    She has no cervical adenopathy.    She has no axillary adenopathy.     Assessment/Plan Invasive CA right breast, T1c,N0,M0. Insertion venous access port as planned. Pt aware and agreeable.  Gerardo Territo G 08/16/2015,  10:26 AM

## 2015-08-16 NOTE — Anesthesia Procedure Notes (Signed)
Procedure Name: MAC Date/Time: 08/16/2015 10:30 AM Performed by: Allean Found Pre-anesthesia Checklist: Patient identified, Emergency Drugs available, Suction available, Patient being monitored and Timeout performed Patient Re-evaluated:Patient Re-evaluated prior to inductionOxygen Delivery Method: Simple face mask Preoxygenation: Pre-oxygenation with 100% oxygen Intubation Type: IV induction Placement Confirmation: positive ETCO2

## 2015-08-16 NOTE — OR Nursing (Signed)
Dr Amie Critchley notified of K-3.3 okay to proceed.

## 2015-08-16 NOTE — Anesthesia Preprocedure Evaluation (Signed)
Anesthesia Evaluation  Patient identified by MRN, date of birth, ID band Patient awake    Reviewed: Allergy & Precautions, H&P , NPO status , Patient's Chart, lab work & pertinent test results  History of Anesthesia Complications Negative for: history of anesthetic complications  Airway Mallampati: III  TM Distance: >3 FB Neck ROM: limited    Dental  (+) Poor Dentition, Chipped, Caps   Pulmonary neg pulmonary ROS, neg shortness of breath,    Pulmonary exam normal breath sounds clear to auscultation       Cardiovascular Exercise Tolerance: Good hypertension, (-) angina(-) Past MI and (-) DOE Normal cardiovascular exam Rhythm:regular Rate:Normal     Neuro/Psych negative neurological ROS  negative psych ROS   GI/Hepatic negative GI ROS, Neg liver ROS, neg GERD  ,  Endo/Other  negative endocrine ROS  Renal/GU negative Renal ROS  negative genitourinary   Musculoskeletal   Abdominal   Peds  Hematology negative hematology ROS (+)   Anesthesia Other Findings Past Medical History:   Hypertension                                                 Hyperlipidemia                                               Cancer ([place])                                                 Visual blurriness                                              Comment:SOMETHING NEW THAT HAS DEVELOPED SINCE 07-13-15               SURGERY- SEEN AT [facility] E[facility] AND EXAM WAS               NORMAL PER PT (08-09-15)  Past Surgical History:   ABDOMINAL HYSTERECTOMY                           1987         fatty tumor                                     Left 2013           Comment:side   BUNIONECTOMY                                    Right 1993         BREAST BIOPSY                                   Right  Comment:neg   BREAST BIOPSY                                   Left                Comment:4 oclock, FIBROEPITHELIAL LESION FAVOR CELLULAR      FIBROADENOMA   BREAST BIOPSY                                   Left                Comment:2 oclock, CYSTIC APOCRINE METAPLASIA   BREAST BIOPSY                                   Right 07/13/15        Comment:130 INVASIVE MAMMARY CARCINOMA, NO SPECIAL TYPE   BREAST LUMPECTOMY WITH SENTINEL LYMPH NODE BIO* Right 07/13/2015      Comment:Procedure: BREAST LUMPECTOMY WITH SENTINEL               LYMPH NODE BX;  Surgeon: Christene Lye,              MD;  Location: [facility] ORS;  Service: General;                Laterality: Right;   BREAST LUMPECTOMY                               Left 07/13/2015      Comment:Procedure: BREAST LUMPECTOMY;  Surgeon:               Christene Lye, MD;  Location: [facility] ORS;              Service: General;  Laterality: Left;  BMI    Body Mass Index   24.13 kg/m 2    Patient reports that it is ok to use blood pressure cuffs or place IVs on upper extremities.  Reproductive/Obstetrics negative OB ROS                             Anesthesia Physical Anesthesia Plan  ASA: III  Anesthesia Plan: General and MAC   Post-op Pain Management:    Induction:   Airway Management Planned:   Additional Equipment:   Intra-op Plan:   Post-operative Plan:   Informed Consent: I have reviewed the patients History and Physical, chart, labs and discussed the procedure including the risks, benefits and alternatives for the proposed anesthesia with the patient or authorized representative who has indicated his/her understanding and acceptance.   Dental Advisory Given  Plan Discussed with: Anesthesiologist, CRNA and Surgeon  Anesthesia Plan Comments:         Anesthesia Quick Evaluation

## 2015-08-16 NOTE — Transfer of Care (Signed)
Immediate Anesthesia Transfer of Care Note  Patient: Sandra [name]  Procedure(s) Performed: Procedure(s): INSERTION PORT-A-CATH (Left)  Patient Location: PACU  Anesthesia Type:MAC  Level of Consciousness: sedated  Airway & Oxygen Therapy: Patient Spontanous Breathing and Patient connected to face mask oxygen  Post-op Assessment: Report given to RN and Post -op Vital signs reviewed and stable  Post vital signs: Reviewed and stable  Last Vitals:  Filed Vitals:   08/16/15 0937 08/16/15 1136  BP: 136/86 110/75  Pulse: 83 66  Temp: 35.3 C 36.7 C  Resp: 16 15    Complications: No apparent anesthesia complications

## 2015-08-16 NOTE — Discharge Instructions (Signed)

## 2015-08-16 NOTE — Op Note (Signed)
Preop diagnosis: Carcinoma right breast  Post op diagnosis: Same  Operation: Insertion venous access port left internal jugular vein with ultrasound and fluoroscopic guidance  Surgeon: S.G.[name]  Assistant:      Anesthesia: Gen.   Complications: None  EBL: Minimal  Drains: None  Description: Patient was placed the supine position the operating table in the left upper chest and neck area were prepped and draped as sterile field. Timeout was performed. Patient was placed in  slight Trendelenburg position. Ultrasound probe was utilized to identify the subclavian vein which was extremely small and felt to be unsuitable for catheter insertion. Accordingly the internal jugular vein was easily identified in the lower neck and local anesthetic was instilled. A small skin incision 1 cm was made. A needle was successfully positioned in the vein using the ultrasound guidance and with free withdrawal of blood. Following the Seldinger technique the catheter was inserted successfully positioned going towards the superior vena cava verified with fluoroscopy. This port site was selected over the second costal cartilage and interspace with local anesthetic was instilled and a 2.5 cm incision was made. Subcutaneous pocket was created with cautery. Catheter was then tunneled through this site and cut to approximate length. It was then fixed to the prefilled port. The port was positioned in the pocket and anchored to the underlying tissue with 3 stitches of 2-0 Prolene. Fluoroscopy was repeated to ensure there was no kinking and the catheter tip was in good position in the distal SVC. The port was flushed through with heparinized saline. Subcutaneous tissue was closed 3-0 Vicryl and the skin with subcuticular 4-0 Vicryl covered with liqui  Ban. Patient subsequently returned recovery room stable condition

## 2015-08-16 NOTE — Anesthesia Postprocedure Evaluation (Signed)
Anesthesia Post Note  Patient: Sandra [name]  Procedure(s) Performed: Procedure(s) (LRB): INSERTION PORT-A-CATH (Left)  Patient location during evaluation: PACU Anesthesia Type: General Level of consciousness: awake and alert Pain management: pain level controlled Vital Signs Assessment: post-procedure vital signs reviewed and stable Respiratory status: spontaneous breathing, nonlabored ventilation, respiratory function stable and patient connected to nasal cannula oxygen Cardiovascular status: blood pressure returned to baseline and stable Postop Assessment: no signs of nausea or vomiting Anesthetic complications: no    Last Vitals:  Filed Vitals:   08/16/15 1206 08/16/15 1236  BP: 121/73 124/71  Pulse: 67 66  Temp: 36.7 C 35.7 C  Resp: 20 18    Last Pain:  Filed Vitals:   08/16/15 1237  PainSc: 0-No pain                 Precious Haws Aleksandar Duve

## 2015-08-16 NOTE — Interval H&P Note (Signed)
History and Physical Interval Note:  08/16/2015 10:30 AM  Sandra [name]  has presented today for surgery, with the diagnosis of RIGHT BREAST CANCER  The various methods of treatment have been discussed with the patient and family. After consideration of risks, benefits and other options for treatment, the patient has consented to  Procedure(s): INSERTION PORT-A-CATH (Left) as a surgical intervention .  The patient's history has been reviewed, patient examined, no change in status, stable for surgery.  I have reviewed the patient's chart and labs.  Questions were answered to the patient's satisfaction.     [name],[name]

## 2015-08-18 ENCOUNTER — Inpatient Hospital Stay: Payer: Medicare Other | Attending: Oncology | Admitting: Oncology

## 2015-08-18 ENCOUNTER — Other Ambulatory Visit: Payer: Self-pay | Admitting: Oncology

## 2015-08-18 ENCOUNTER — Encounter: Payer: Self-pay | Admitting: Oncology

## 2015-08-18 ENCOUNTER — Inpatient Hospital Stay: Payer: Medicare Other

## 2015-08-18 ENCOUNTER — Encounter: Payer: Self-pay | Admitting: *Deleted

## 2015-08-18 DIAGNOSIS — Z90710 Acquired absence of both cervix and uterus: Secondary | ICD-10-CM | POA: Insufficient documentation

## 2015-08-18 DIAGNOSIS — Z9223 Personal history of estrogen therapy: Secondary | ICD-10-CM | POA: Diagnosis not present

## 2015-08-18 DIAGNOSIS — Z7689 Persons encountering health services in other specified circumstances: Secondary | ICD-10-CM | POA: Diagnosis not present

## 2015-08-18 DIAGNOSIS — C50919 Malignant neoplasm of unspecified site of unspecified female breast: Secondary | ICD-10-CM

## 2015-08-18 DIAGNOSIS — C50211 Malignant neoplasm of upper-inner quadrant of right female breast: Secondary | ICD-10-CM | POA: Insufficient documentation

## 2015-08-18 DIAGNOSIS — Z5111 Encounter for antineoplastic chemotherapy: Secondary | ICD-10-CM | POA: Insufficient documentation

## 2015-08-18 DIAGNOSIS — E785 Hyperlipidemia, unspecified: Secondary | ICD-10-CM | POA: Insufficient documentation

## 2015-08-18 DIAGNOSIS — I10 Essential (primary) hypertension: Secondary | ICD-10-CM | POA: Diagnosis not present

## 2015-08-18 DIAGNOSIS — Z01818 Encounter for other preprocedural examination: Secondary | ICD-10-CM

## 2015-08-18 DIAGNOSIS — Z79899 Other long term (current) drug therapy: Secondary | ICD-10-CM | POA: Diagnosis not present

## 2015-08-18 DIAGNOSIS — Z170 Estrogen receptor positive status [ER+]: Secondary | ICD-10-CM

## 2015-08-18 LAB — CBC WITH DIFFERENTIAL/PLATELET
Basophils Absolute: 0 10*3/uL (ref 0–0.1)
Basophils Relative: 1 %
Eosinophils Absolute: 0.1 10*3/uL (ref 0–0.7)
Eosinophils Relative: 3 %
HEMATOCRIT: 40.2 % (ref 35.0–47.0)
HEMOGLOBIN: 13.4 g/dL (ref 12.0–16.0)
LYMPHS ABS: 1 10*3/uL (ref 1.0–3.6)
LYMPHS PCT: 28 %
MCH: 29.4 pg (ref 26.0–34.0)
MCHC: 33.4 g/dL (ref 32.0–36.0)
MCV: 88 fL (ref 80.0–100.0)
MONOS PCT: 7 %
Monocytes Absolute: 0.3 10*3/uL (ref 0.2–0.9)
NEUTROS ABS: 2.3 10*3/uL (ref 1.4–6.5)
NEUTROS PCT: 61 %
Platelets: 297 10*3/uL (ref 150–440)
RBC: 4.57 MIL/uL (ref 3.80–5.20)
RDW: 12.8 % (ref 11.5–14.5)
WBC: 3.8 10*3/uL (ref 3.6–11.0)

## 2015-08-18 LAB — COMPREHENSIVE METABOLIC PANEL
ALBUMIN: 4.2 g/dL (ref 3.5–5.0)
ALK PHOS: 56 U/L (ref 38–126)
ALT: 15 U/L (ref 14–54)
ANION GAP: 5 (ref 5–15)
AST: 21 U/L (ref 15–41)
BILIRUBIN TOTAL: 0.7 mg/dL (ref 0.3–1.2)
BUN: 12 mg/dL (ref 6–20)
CHLORIDE: 103 mmol/L (ref 101–111)
CO2: 28 mmol/L (ref 22–32)
CREATININE: 0.84 mg/dL (ref 0.44–1.00)
Calcium: 9.2 mg/dL (ref 8.9–10.3)
GFR calc non Af Amer: >60 mL/min (ref >60)
Glucose, Bld: 113 mg/dL — ABNORMAL HIGH (ref 65–99)
Potassium: 3.5 mmol/L (ref 3.5–5.1)
Sodium: 136 mmol/L (ref 135–145)
Total Protein: 7.3 g/dL (ref 6.5–8.1)

## 2015-08-18 LAB — MAGNESIUM: MAGNESIUM: 1.9 mg/dL (ref 1.7–2.4)

## 2015-08-18 NOTE — Progress Notes (Signed)
Patient here today as new evaluation for breast cancer referred by Dr. Donella Stade. Patient has has port placed.

## 2015-08-18 NOTE — Progress Notes (Signed)
  Oncology Nurse Navigator Documentation  Navigator Location: [facility]-Med Onc (08/18/15 1000) Navigator Encounter Type: Initial MedOnc (08/18/15 1000)   Abnormal Finding Date: 06/21/16 (08/18/15 1000) Confirmed Diagnosis Date: 07/02/16 (08/18/15 1000) Surgery Date: 07/13/15 (08/18/15 1000)   Patient Visit Type: MedOnc (08/18/15 1000)   Barriers/Navigation Needs: Education (08/18/15 1000) Education: Newly Diagnosed Cancer Education (08/18/15 1000)    Met with patient and her husband today during her initial medical oncology consult with Dr. Oliva Bustard.  Reviewed plan of care after her consult.  Offered support.  They are to call if they have any questions or concerns.                    Time Spent with Patient: 60 (08/18/15 1000)

## 2015-08-18 NOTE — Progress Notes (Signed)
Sandra [name] @ [facility] [phone]  Fax:([phone]) [phone] OB: 1946/07/05  MR#: [id number]  JYN#:[id number]  Patient Care Team: Lucille Passy, MD as PCP - General (Family Medicine) Leia Alf, MD as Attending Physician (Internal Medicine) Glennie Isle, PA-C (Physician Assistant) Jerene Bears, MD as Consulting Physician (Gastroenterology) Lucille Passy, MD as Consulting Physician (Family Medicine)  CHIEF COMPLAINT:  Chief Complaint  Patient presents with  . 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 Breast CA (HCC) 174.9 C50.919 NM Cardiac [place]     NM Cardiac [place]     CBC with Differential     Comprehensive metabolic panel     Magnesium  2. Examination prior to chemotherapy V72.83 Z01.818 NM Cardiac [place]     NM Cardiac [place]     CBC with Differential     Comprehensive metabolic panel     Magnesium  3. High risk medication use V58.69 Z79.899 NM Cardiac [place]     NM Cardiac [place]      No history exists.    Oncology Flowsheet 07/13/2015  dexamethasone (DECADRON) IV -  ondansetron (ZOFRAN) IV -    INTERVAL HISTORY: 70 year old lady who has no family history of breast cancer.  Had a distant history of estrogen replacement therapy only for 1 year.  Had abnormal mammogram which was evaluated underwent biopsy which was positive for invasive mammary carcinoma patient underwent lumpectomy and sentinel lymph node biopsy.  Stage IC disease with estrogen receptor positive progesterone receptor positive and I risk Mammo print has  been observed.  Patient is here to discuss the result and further planning of treatment. And is accompanied with family member REVIEW OF SYSTEMS:   GENERAL:  Feels good.  Active.  No fevers, sweats or weight loss. PERFORMANCE STATUS (ECOG):0 HEENT:  No visual changes, runny nose, sore throat, mouth sores or tenderness. Lungs: No shortness of breath or cough.  No hemoptysis. Cardiac:  No chest pain, palpitations, orthopnea, or PND. GI:  No nausea, vomiting, diarrhea, constipation, melena or hematochezia. GU:  No urgency, frequency, dysuria, or hematuria. Musculoskeletal:  No back pain.  No joint pain.  No muscle tenderness. Extremities:  No pain or swelling. Skin:  No rashes or skin changes. Neuro:  No headache, numbness or weakness, balance or coordination issues. Endocrine:  No diabetes, thyroid issues, hot flashes or night sweats. Psych:  No mood changes, depression or anxiety. Pain:  No focal pain. Review of systems:  All other systems reviewed and found to be negative.  As per HPI. Otherwise, a complete review of systems is negatve.  PAST MEDICAL HISTORY: Past Medical History  Diagnosis Date  . Hypertension   . Hyperlipidemia   . Cancer ([place])   . Visual blurriness     SOMETHING NEW THAT HAS DEVELOPED SINCE 07-13-15 SURGERY- SEEN AT [facility] ENT AND EXAM WAS NORMAL PER PT (08-09-15)    PAST SURGICAL HISTORY: Past Surgical History  Procedure Laterality Date  . Abdominal hysterectomy  1987  . Fatty tumor Left 2013    side  .  Bunionectomy Right 1993  . Breast biopsy Right     neg  . Breast biopsy Left     4 oclock, FIBROEPITHELIAL LESION FAVOR CELLULAR FIBROADENOMA  . Breast biopsy Left     2 oclock, CYSTIC APOCRINE METAPLASIA  . Breast biopsy Right 07/13/15    130 INVASIVE MAMMARY CARCINOMA, NO SPECIAL TYPE  . Breast lumpectomy with sentinel lymph node biopsy Right 07/13/2015    Procedure: BREAST LUMPECTOMY WITH SENTINEL LYMPH NODE BX;  Surgeon: Christene Lye, MD;   Location: [facility] ORS;  Service: General;  Laterality: Right;  . Breast lumpectomy Left 07/13/2015    Procedure: BREAST LUMPECTOMY;  Surgeon: Christene Lye, MD;  Location: [facility] ORS;  Service: General;  Laterality: Left;  . Portacath placement Left 08/16/2015    Procedure: INSERTION PORT-A-CATH;  Surgeon: Christene Lye, MD;  Location: [facility] ORS;  Service: General;  Laterality: Left;    FAMILY HISTORY Family History  Problem Relation Age of Onset  . Cancer Sister 78    breast cancer  . Colon cancer Neg Hx     GYNECOLOGIC HISTORY:  No LMP recorded. Patient has had a hysterectomy.     ADVANCED DIRECTIVES:    HEALTH MAINTENANCE: Social History  Substance Use Topics  . Smoking status: Never Smoker   . Smokeless tobacco: Never Used  . Alcohol Use: No     Colonoscopy:  PAP:  Bone density:  Lipid panel:  Allergies  Allergen Reactions  . Lovastatin Anaphylaxis    Tongue swelling    Current Outpatient Prescriptions  Medication Sig Dispense Refill  . amLODipine (NORVASC) 5 MG tablet TAKE ONE TABLET BY MOUTH EVERY DAY (Patient taking differently: Take 5 mg by mouth every morning. TAKE ONE TABLET BY MOUTH EVERY DAY) 90 tablet 3  . cholecalciferol (VITAMIN D) 1000 units tablet Take 1,000 Units by mouth daily.    . hydrochlorothiazide (HYDRODIURIL) 25 MG tablet TAKE ONE TABLET BY MOUTH EVERY DAY 90 tablet 3  . Potassium Chloride ER 20 MEQ TBCR Take 1 tablet by mouth daily.    . pravastatin (PRAVACHOL) 10 MG tablet Take 1 tablet (10 mg total) by mouth daily. (Patient taking differently: Take 10 mg by mouth at bedtime. ) 90 tablet 3  . traMADol (ULTRAM) 50 MG tablet Take 1 tablet (50 mg total) by mouth every 6 (six) hours as needed. 30 tablet 0   No current facility-administered medications for this visit.    OBJECTIVE: PHYSICAL EXAM: GENERAL:  Well developed, well nourished, sitting comfortably in the exam room in no acute distress. MENTAL STATUS:  Alert and oriented  to person, place and time.  ENT:  Oropharynx clear without lesion.  Tongue normal. Mucous membranes moist.  RESPIRATORY:  Clear to auscultation without rales, wheezes or rhonchi. Patient has a port placed on the left upper chest wall area.  No evidence of redness. CARDIOVASCULAR:  Regular rate and rhythm without murmur, rub or gallop. BREAST:  Right breast without masses, wound has healed well.  skin changes or nipple discharge.  Left breast without masses, skin changes or nipple discharge. ABDOMEN:  Soft, non-tender, with active bowel sounds, and no hepatosplenomegaly.  No masses. BACK:  No CVA tenderness.  No tenderness on percussion of the back or rib cage. SKIN:  No rashes, ulcers or lesions. EXTREMITIES: No edema, no skin discoloration or tenderness.  No palpable cords. LYMPH NODES: No palpable cervical, supraclavicular, axillary or inguinal adenopathy  NEUROLOGICAL: Unremarkable. PSYCH:  Appropriate.  Filed Vitals:   08/18/15  0858  BP: 130/80  Pulse: 80  Temp: 96.8 F (36 C)  Resp: 18     Body mass index is 24.47 kg/(m^2).    ECOG FS:0 - Asymptomatic  LAB RESULTS:  Appointment on 08/18/2015  Component Date Value Ref Range Status  . WBC 08/18/2015 3.8  3.6 - 11.0 K/uL Final  . RBC 08/18/2015 4.57  3.80 - 5.20 MIL/uL Final  . Hemoglobin 08/18/2015 13.4  12.0 - 16.0 g/dL Final  . HCT 08/18/2015 40.2  35.0 - 47.0 % Final  . MCV 08/18/2015 88.0  80.0 - 100.0 fL Final  . MCH 08/18/2015 29.4  26.0 - 34.0 pg Final  . MCHC 08/18/2015 33.4  32.0 - 36.0 g/dL Final  . RDW 08/18/2015 12.8  11.5 - 14.5 % Final  . Platelets 08/18/2015 297  150 - 440 K/uL Final  . Neutrophils Relative % 08/18/2015 61   Final  . Neutro Abs 08/18/2015 2.3  1.4 - 6.5 K/uL Final  . Lymphocytes Relative 08/18/2015 28   Final  . Lymphs Abs 08/18/2015 1.0  1.0 - 3.6 K/uL Final  . Monocytes Relative 08/18/2015 7   Final  . Monocytes Absolute 08/18/2015 0.3  0.2 - 0.9 K/uL Final  . Eosinophils Relative  08/18/2015 3   Final  . Eosinophils Absolute 08/18/2015 0.1  0 - 0.7 K/uL Final  . Basophils Relative 08/18/2015 1   Final  . Basophils Absolute 08/18/2015 0.0  0 - 0.1 K/uL Final  . Sodium 08/18/2015 136  135 - 145 mmol/L Final  . Potassium 08/18/2015 3.5  3.5 - 5.1 mmol/L Final  . Chloride 08/18/2015 103  101 - 111 mmol/L Final  . CO2 08/18/2015 28  22 - 32 mmol/L Final  . Glucose, Bld 08/18/2015 113* 65 - 99 mg/dL Final  . BUN 08/18/2015 12  6 - 20 mg/dL Final  . Creatinine, Ser 08/18/2015 0.84  0.44 - 1.00 mg/dL Final  . Calcium 08/18/2015 9.2  8.9 - 10.3 mg/dL Final  . Total Protein 08/18/2015 7.3  6.5 - 8.1 g/dL Final  . Albumin 08/18/2015 4.2  3.5 - 5.0 g/dL Final  . AST 08/18/2015 21  15 - 41 U/L Final  . ALT 08/18/2015 15  14 - 54 U/L Final  . Alkaline Phosphatase 08/18/2015 56  38 - 126 U/L Final  . Total Bilirubin 08/18/2015 0.7  0.3 - 1.2 mg/dL Final  . GFR calc non Af Amer 08/18/2015 >60  >60 mL/min Final  . GFR calc Af Amer 08/18/2015 >60  >60 mL/min Final   Comment: (NOTE) The eGFR has been calculated using the CKD EPI equation. This calculation has not been validated in all clinical situations. eGFR's persistently <60 mL/min signify possible Chronic Kidney Disease.   . Anion gap 08/18/2015 5  5 - 15 Final  . Magnesium 08/18/2015 1.9  1.7 - 2.4 mg/dL Final  Admission on 08/16/2015, Discharged on 08/16/2015  Component Date Value Ref Range Status  . Sodium 08/16/2015 141  135 - 145 mmol/L Final  . Potassium 08/16/2015 3.3* 3.5 - 5.1 mmol/L Final  . Glucose, Bld 08/16/2015 109* 65 - 99 mg/dL Final  . HCT 08/16/2015 39.0  36.0 - 46.0 % Final  . Hemoglobin 08/16/2015 13.3  12.0 - 15.0 g/dL Final     STUDIES: Dg Chest Port 1 View  08/16/2015  CLINICAL DATA:  Porta catheter. EXAM: PORTABLE CHEST 1 VIEW COMPARISON:  None. FINDINGS: Left IJ porta catheter with tip at the upper right atrium. No pneumothorax  or concerning mediastinal widening. Normal heart size for  technique. Mild aortic tortuosity. There is no edema, consolidation, or effusion IMPRESSION: No acute finding after left porta catheter placement. Electronically Signed   By: Monte Fantasia M.D.   On: 08/16/2015 12:30   Dg C-arm 1-60 Min-no Report  08/16/2015  CLINICAL DATA: port placement C-ARM 1-60 MINUTES Fluoroscopy was utilized by the requesting physician.  No radiographic interpretation.    ASSESSMENT: cancer   of right breast stage IC disease.  Estrogen receptor progesterone receptor positive HER-2 receptor negative.  With Mammo print score is high risk with average risk of 22% at 5 years distant recurrent disease or 29% at that 10 years. I had prolonged discussion with family.  Patient has been recently married and her husband was lost his wife with ovarian cancer was undergone all kind of cancer treatment had number of questions.  Recommendation would include.  Chemotherapy with Cytoxan and Adriamycin every 3 weeks 4 followed by Taxol   X  12  followed by Radiation therapy to the right breast  followed by anti-hormonal therapy with aromatase inhibitor.  All the side effects of chemotherapy and been discussed.  Patient will attend chemotherapy class.  We will get a MUGA scan of the heart. Lab data has been reviewed.  Pathology report has been reviewed.  Case will be discussed in breast multidisciplinary case conference.  I discussed situation with Dr. Jamal Collin  Patient was previously evaluated by Dr. Burgess Amor about leukopenia and no abnormality was detected.   PLAN:  No problem-specific assessment & plan notes found for this encounter.   Patient expressed understanding and was in agreement with this plan. She also understands that She can call clinic at any time with any questions, concerns, or complaints.    No matching staging information was found for the patient.  Forest Gleason, MD   08/18/2015 12:25 PM

## 2015-08-21 NOTE — Patient Instructions (Signed)
Doxorubicin injection What is this medicine? DOXORUBICIN (dox oh ROO bi sin) is a chemotherapy drug. It is used to treat many kinds of cancer like Hodgkin's disease, leukemia, non-Hodgkin's lymphoma, neuroblastoma, sarcoma, and Wilms' tumor. It is also used to treat bladder cancer, breast cancer, lung cancer, ovarian cancer, stomach cancer, and thyroid cancer. This medicine may be used for other purposes; ask your health care provider or pharmacist if you have questions. What should I tell my health care provider before I take this medicine? They need to know if you have any of these conditions: -blood disorders -heart disease, recent heart attack -infection (especially a virus infection such as chickenpox, cold sores, or herpes) -irregular heartbeat -liver disease -recent or ongoing radiation therapy -an unusual or allergic reaction to doxorubicin, other chemotherapy agents, other medicines, foods, dyes, or preservatives -pregnant or trying to get pregnant -breast-feeding How should I use this medicine? This drug is given as an infusion into a vein. It is administered in a hospital or clinic by a specially trained health care professional. If you have pain, swelling, burning or any unusual feeling around the site of your injection, tell your health care professional right away. Talk to your pediatrician regarding the use of this medicine in children. Special care may be needed. Overdosage: If you think you have taken too much of this medicine contact a poison control center or emergency room at once. NOTE: This medicine is only for you. Do not share this medicine with others. What if I miss a dose? It is important not to miss your dose. Call your doctor or health care professional if you are unable to keep an appointment. What may interact with this medicine? Do not take this medicine with any of the following medications: -cisapride -droperidol -halofantrine -pimozide -zidovudine This  medicine may also interact with the following medications: -chloroquine -chlorpromazine -clarithromycin -cyclophosphamide -cyclosporine -erythromycin -medicines for depression, anxiety, or psychotic disturbances -medicines for irregular heart beat like amiodarone, bepridil, dofetilide, encainide, flecainide, propafenone, quinidine -medicines for seizures like ethotoin, fosphenytoin, phenytoin -medicines for nausea, vomiting like dolasetron, ondansetron, palonosetron -medicines to increase blood counts like filgrastim, pegfilgrastim, sargramostim -methadone -methotrexate -pentamidine -progesterone -vaccines -verapamil Talk to your doctor or health care professional before taking any of these medicines: -acetaminophen -aspirin -ibuprofen -ketoprofen -naproxen This list may not describe all possible interactions. Give your health care provider a list of all the medicines, herbs, non-prescription drugs, or dietary supplements you use. Also tell them if you smoke, drink alcohol, or use illegal drugs. Some items may interact with your medicine. What should I watch for while using this medicine? Your condition will be monitored carefully while you are receiving this medicine. You will need important blood work done while you are taking this medicine. This drug may make you feel generally unwell. This is not uncommon, as chemotherapy can affect healthy cells as well as cancer cells. Report any side effects. Continue your course of treatment even though you feel ill unless your doctor tells you to stop. Your urine may turn red for a few days after your dose. This is not blood. If your urine is dark or brown, call your doctor. In some cases, you may be given additional medicines to help with side effects. Follow all directions for their use. Call your doctor or health care professional for advice if you get a fever, chills or sore throat, or other symptoms of a cold or flu. Do not treat yourself.  This drug decreases your body's ability to   fight infections. Try to avoid being around people who are sick. This medicine may increase your risk to bruise or bleed. Call your doctor or health care professional if you notice any unusual bleeding. Be careful brushing and flossing your teeth or using a toothpick because you may get an infection or bleed more easily. If you have any dental work done, tell your dentist you are receiving this medicine. Avoid taking products that contain aspirin, acetaminophen, ibuprofen, naproxen, or ketoprofen unless instructed by your doctor. These medicines may hide a fever. Men and women of childbearing age should use effective birth control methods while using taking this medicine. Do not become pregnant while taking this medicine. There is a potential for serious side effects to an unborn child. Talk to your health care professional or pharmacist for more information. Do not breast-feed an infant while taking this medicine. Do not let others touch your urine or other body fluids for 5 days after each treatment with this medicine. Caregivers should wear latex gloves to avoid touching body fluids during this time. There is a maximum amount of this medicine you should receive throughout your life. The amount depends on the medical condition being treated and your overall health. Your doctor will watch how much of this medicine you receive in your lifetime. Tell your doctor if you have taken this medicine before. What side effects may I notice from receiving this medicine? Side effects that you should report to your doctor or health care professional as soon as possible: -allergic reactions like skin rash, itching or hives, swelling of the face, lips, or tongue -low blood counts - this medicine may decrease the number of white blood cells, red blood cells and platelets. You may be at increased risk for infections and bleeding. -signs of infection - fever or chills, cough,  sore throat, pain or difficulty passing urine -signs of decreased platelets or bleeding - bruising, pinpoint red spots on the skin, black, tarry stools, blood in the urine -signs of decreased red blood cells - unusually weak or tired, fainting spells, lightheadedness -breathing problems -chest pain -fast, irregular heartbeat -mouth sores -nausea, vomiting -pain, swelling, redness at site where injected -pain, tingling, numbness in the hands or feet -swelling of ankles, feet, or hands -unusual bleeding or bruising Side effects that usually do not require medical attention (report to your doctor or health care professional if they continue or are bothersome): -diarrhea -facial flushing -hair loss -loss of appetite -missed menstrual periods -nail discoloration or damage -red or watery eyes -red colored urine -stomach upset This list may not describe all possible side effects. Call your doctor for medical advice about side effects. You may report side effects to FDA at 1-800-FDA-1088. Where should I keep my medicine? This drug is given in a hospital or clinic and will not be stored at home. NOTE: This sheet is a summary. It may not cover all possible information. If you have questions about this medicine, talk to your doctor, pharmacist, or health care provider.    2016, [employer]. (2012-11-17 09:54:34) Cyclophosphamide injection What is this medicine? CYCLOPHOSPHAMIDE (sye kloe FOSS fa mide) is a chemotherapy drug. It slows the growth of cancer cells. This medicine is used to treat many types of cancer like lymphoma, myeloma, leukemia, breast cancer, and ovarian cancer, to name a few. This medicine may be used for other purposes; ask your health care provider or pharmacist if you have questions. What should I tell my health care provider before I take   this medicine? They need to know if you have any of these conditions: -blood disorders -history of other  chemotherapy -infection -kidney disease -liver disease -recent or ongoing radiation therapy -tumors in the bone marrow -an unusual or allergic reaction to cyclophosphamide, other chemotherapy, other medicines, foods, dyes, or preservatives -pregnant or trying to get pregnant -breast-feeding How should I use this medicine? This drug is usually given as an injection into a vein or muscle or by infusion into a vein. It is administered in a hospital or clinic by a specially trained health care professional. Talk to your pediatrician regarding the use of this medicine in children. Special care may be needed. Overdosage: If you think you have taken too much of this medicine contact a poison control center or emergency room at once. NOTE: This medicine is only for you. Do not share this medicine with others. What if I miss a dose? It is important not to miss your dose. Call your doctor or health care professional if you are unable to keep an appointment. What may interact with this medicine? This medicine may interact with the following medications: -amiodarone -amphotericin B -azathioprine -certain antiviral medicines for HIV or AIDS such as protease inhibitors (e.g., indinavir, ritonavir) and zidovudine -certain blood pressure medications such as benazepril, captopril, enalapril, fosinopril, lisinopril, moexipril, monopril, perindopril, quinapril, ramipril, trandolapril -certain cancer medications such as anthracyclines (e.g., daunorubicin, doxorubicin), busulfan, cytarabine, paclitaxel, pentostatin, tamoxifen, trastuzumab -certain diuretics such as chlorothiazide, chlorthalidone, hydrochlorothiazide, indapamide, metolazone -certain medicines that treat or prevent blood clots like warfarin -certain muscle relaxants such as succinylcholine -cyclosporine -etanercept -indomethacin -medicines to increase blood counts like filgrastim, pegfilgrastim, sargramostim -medicines used as general  anesthesia -metronidazole -natalizumab This list may not describe all possible interactions. Give your health care provider a list of all the medicines, herbs, non-prescription drugs, or dietary supplements you use. Also tell them if you smoke, drink alcohol, or use illegal drugs. Some items may interact with your medicine. What should I watch for while using this medicine? Visit your doctor for checks on your progress. This drug may make you feel generally unwell. This is not uncommon, as chemotherapy can affect healthy cells as well as cancer cells. Report any side effects. Continue your course of treatment even though you feel ill unless your doctor tells you to stop. Drink water or other fluids as directed. Urinate often, even at night. In some cases, you may be given additional medicines to help with side effects. Follow all directions for their use. Call your doctor or health care professional for advice if you get a fever, chills or sore throat, or other symptoms of a cold or flu. Do not treat yourself. This drug decreases your body's ability to fight infections. Try to avoid being around people who are sick. This medicine may increase your risk to bruise or bleed. Call your doctor or health care professional if you notice any unusual bleeding. Be careful brushing and flossing your teeth or using a toothpick because you may get an infection or bleed more easily. If you have any dental work done, tell your dentist you are receiving this medicine. You may get drowsy or dizzy. Do not drive, use machinery, or do anything that needs mental alertness until you know how this medicine affects you. Do not become pregnant while taking this medicine or for 1 year after stopping it. Women should inform their doctor if they wish to become pregnant or think they might be pregnant. Men should not father a child   while taking this medicine and for 4 months after stopping it. There is a potential for serious side  effects to an unborn child. Talk to your health care professional or pharmacist for more information. Do not breast-feed an infant while taking this medicine. This medicine may interfere with the ability to have a child. This medicine has caused ovarian failure in some women. This medicine has caused reduced sperm counts in some men. You should talk with your doctor or health care professional if you are concerned about your fertility. If you are going to have surgery, tell your doctor or health care professional that you have taken this medicine. What side effects may I notice from receiving this medicine? Side effects that you should report to your doctor or health care professional as soon as possible: -allergic reactions like skin rash, itching or hives, swelling of the face, lips, or tongue -low blood counts - this medicine may decrease the number of white blood cells, red blood cells and platelets. You may be at increased risk for infections and bleeding. -signs of infection - fever or chills, cough, sore throat, pain or difficulty passing urine -signs of decreased platelets or bleeding - bruising, pinpoint red spots on the skin, black, tarry stools, blood in the urine -signs of decreased red blood cells - unusually weak or tired, fainting spells, lightheadedness -breathing problems -dark urine -dizziness -palpitations -swelling of the ankles, feet, hands -trouble passing urine or change in the amount of urine -weight gain -yellowing of the eyes or skin Side effects that usually do not require medical attention (report to your doctor or health care professional if they continue or are bothersome): -changes in nail or skin color -hair loss -missed menstrual periods -mouth sores -nausea, vomiting This list may not describe all possible side effects. Call your doctor for medical advice about side effects. You may report side effects to FDA at 1-800-FDA-1088. Where should I keep my  medicine? This drug is given in a hospital or clinic and will not be stored at home. NOTE: This sheet is a summary. It may not cover all possible information. If you have questions about this medicine, talk to your doctor, pharmacist, or health care provider.    2016, [employer]. (2012-06-05 16:22:58) Paclitaxel injection What is this medicine? PACLITAXEL (PAK li TAX el) is a chemotherapy drug. It targets fast dividing cells, like cancer cells, and causes these cells to die. This medicine is used to treat ovarian cancer, breast cancer, and other cancers. This medicine may be used for other purposes; ask your health care provider or pharmacist if you have questions. What should I tell my health care provider before I take this medicine? They need to know if you have any of these conditions: -blood disorders -irregular heartbeat -infection (especially a virus infection such as chickenpox, cold sores, or herpes) -liver disease -previous or ongoing radiation therapy -an unusual or allergic reaction to paclitaxel, alcohol, polyoxyethylated castor oil, other chemotherapy agents, other medicines, foods, dyes, or preservatives -pregnant or trying to get pregnant -breast-feeding How should I use this medicine? This drug is given as an infusion into a vein. It is administered in a hospital or clinic by a specially trained health care professional. Talk to your pediatrician regarding the use of this medicine in children. Special care may be needed. Overdosage: If you think you have taken too much of this medicine contact a poison control center or emergency room at once. NOTE: This medicine is only for you. Do   not share this medicine with others. What if I miss a dose? It is important not to miss your dose. Call your doctor or health care professional if you are unable to keep an appointment. What may interact with this medicine? Do not take this medicine with any of the following  medications: -disulfiram -metronidazole This medicine may also interact with the following medications: -cyclosporine -diazepam -ketoconazole -medicines to increase blood counts like filgrastim, pegfilgrastim, sargramostim -other chemotherapy drugs like cisplatin, doxorubicin, epirubicin, etoposide, teniposide, vincristine -quinidine -testosterone -vaccines -verapamil Talk to your doctor or health care professional before taking any of these medicines: -acetaminophen -aspirin -ibuprofen -ketoprofen -naproxen This list may not describe all possible interactions. Give your health care provider a list of all the medicines, herbs, non-prescription drugs, or dietary supplements you use. Also tell them if you smoke, drink alcohol, or use illegal drugs. Some items may interact with your medicine. What should I watch for while using this medicine? Your condition will be monitored carefully while you are receiving this medicine. You will need important blood work done while you are taking this medicine. This drug may make you feel generally unwell. This is not uncommon, as chemotherapy can affect healthy cells as well as cancer cells. Report any side effects. Continue your course of treatment even though you feel ill unless your doctor tells you to stop. This medicine can cause serious allergic reactions. To reduce your risk you will need to take other medicine(s) before treatment with this medicine. In some cases, you may be given additional medicines to help with side effects. Follow all directions for their use. Call your doctor or health care professional for advice if you get a fever, chills or sore throat, or other symptoms of a cold or flu. Do not treat yourself. This drug decreases your body's ability to fight infections. Try to avoid being around people who are sick. This medicine may increase your risk to bruise or bleed. Call your doctor or health care professional if you notice any  unusual bleeding. Be careful brushing and flossing your teeth or using a toothpick because you may get an infection or bleed more easily. If you have any dental work done, tell your dentist you are receiving this medicine. Avoid taking products that contain aspirin, acetaminophen, ibuprofen, naproxen, or ketoprofen unless instructed by your doctor. These medicines may hide a fever. Do not become pregnant while taking this medicine. Women should inform their doctor if they wish to become pregnant or think they might be pregnant. There is a potential for serious side effects to an unborn child. Talk to your health care professional or pharmacist for more information. Do not breast-feed an infant while taking this medicine. Men are advised not to father a child while receiving this medicine. This product may contain alcohol. Ask your pharmacist or healthcare provider if this medicine contains alcohol. Be sure to tell all healthcare providers you are taking this medicine. Certain medicines, like metronidazole and disulfiram, can cause an unpleasant reaction when taken with alcohol. The reaction includes flushing, headache, nausea, vomiting, sweating, and increased thirst. The reaction can last from 30 minutes to several hours. What side effects may I notice from receiving this medicine? Side effects that you should report to your doctor or health care professional as soon as possible: -allergic reactions like skin rash, itching or hives, swelling of the face, lips, or tongue -low blood counts - This drug may decrease the number of white blood cells, red blood cells and platelets.   You may be at increased risk for infections and bleeding. -signs of infection - fever or chills, cough, sore throat, pain or difficulty passing urine -signs of decreased platelets or bleeding - bruising, pinpoint red spots on the skin, black, tarry stools, nosebleeds -signs of decreased red blood cells - unusually weak or tired,  fainting spells, lightheadedness -breathing problems -chest pain -high or low blood pressure -mouth sores -nausea and vomiting -pain, swelling, redness or irritation at the injection site -pain, tingling, numbness in the hands or feet -slow or irregular heartbeat -swelling of the ankle, feet, hands Side effects that usually do not require medical attention (report to your doctor or health care professional if they continue or are bothersome): -bone pain -complete hair loss including hair on your head, underarms, pubic hair, eyebrows, and eyelashes -changes in the color of fingernails -diarrhea -loosening of the fingernails -loss of appetite -muscle or joint pain -red flush to skin -sweating This list may not describe all possible side effects. Call your doctor for medical advice about side effects. You may report side effects to FDA at 1-800-FDA-1088. Where should I keep my medicine? This drug is given in a hospital or clinic and will not be stored at home. NOTE: This sheet is a summary. It may not cover all possible information. If you have questions about this medicine, talk to your doctor, pharmacist, or health care provider.    2016, [employer]. (2015-03-09 13:02:56)  

## 2015-08-22 ENCOUNTER — Ambulatory Visit: Payer: Self-pay | Admitting: General Surgery

## 2015-08-24 ENCOUNTER — Other Ambulatory Visit: Payer: Self-pay | Admitting: *Deleted

## 2015-08-24 ENCOUNTER — Inpatient Hospital Stay: Payer: Medicare Other

## 2015-08-24 MED ORDER — LIDOCAINE-PRILOCAINE 2.5-2.5 % EX KIT: PACK | CUTANEOUS | Status: DC

## 2015-08-25 ENCOUNTER — Other Ambulatory Visit: Payer: Self-pay | Admitting: *Deleted

## 2015-08-25 DIAGNOSIS — C50011 Malignant neoplasm of nipple and areola, right female breast: Secondary | ICD-10-CM

## 2015-08-27 ENCOUNTER — Other Ambulatory Visit: Payer: Self-pay | Admitting: Family Medicine

## 2015-08-28 ENCOUNTER — Ambulatory Visit
Admission: RE | Admit: 2015-08-28 | Discharge: 2015-08-28 | Disposition: A | Discharge status: Home / Self Care | Payer: Medicare Other | Source: Ambulatory Visit | Attending: Oncology | Admitting: Oncology

## 2015-08-28 DIAGNOSIS — Z79899 Other long term (current) drug therapy: Secondary | ICD-10-CM | POA: Diagnosis not present

## 2015-08-28 DIAGNOSIS — C50011 Malignant neoplasm of nipple and areola, right female breast: Secondary | ICD-10-CM | POA: Diagnosis not present

## 2015-08-28 MED ORDER — TECHNETIUM TC 99M-LABELED RED BLOOD CELLS IV KIT
20.0000 | PACK | Freq: Once | INTRAVENOUS | Status: AC | PRN
  Administered 2015-08-28: 18.79 via INTRAVENOUS

## 2015-08-29 ENCOUNTER — Telehealth: Payer: Self-pay | Admitting: *Deleted

## 2015-08-29 ENCOUNTER — Encounter: Payer: Self-pay | Admitting: General Surgery

## 2015-08-29 ENCOUNTER — Ambulatory Visit (INDEPENDENT_AMBULATORY_CARE_PROVIDER_SITE_OTHER): Payer: Medicare Other | Admitting: General Surgery

## 2015-08-29 VITALS — BP 128/82 | HR 80 | Resp 14 | Ht 62.0 in | Wt 143.0 lb

## 2015-08-29 DIAGNOSIS — C50911 Malignant neoplasm of unspecified site of right female breast: Secondary | ICD-10-CM

## 2015-08-29 MED ORDER — LIDOCAINE-PRILOCAINE 2.5-2.5 % EX KIT: PACK | CUTANEOUS | Status: DC

## 2015-08-29 NOTE — Patient Instructions (Addendum)
The patient is aware to call back for any questions or concerns. Return in May after right breast diagnostic mammogram.

## 2015-08-29 NOTE — Telephone Encounter (Signed)
Pt start chemo tomorrow (08/30/15) and does not have emla cream. emla cream escribed to pharmacy.

## 2015-08-29 NOTE — Progress Notes (Signed)
Here today for postoperative visit port placement 08-16-15. She states she is doing well. I have reviewed the history of present illness with the patient. Incision port is clean and intact. Lungs clear. Right breast and axillary incisions are well healed. 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 Right breast diagnostic mammogram.     PCP:  Arnette Norris  This information has been scribed by Karie Fetch RNBC.

## 2015-08-30 ENCOUNTER — Inpatient Hospital Stay (HOSPITAL_BASED_OUTPATIENT_CLINIC_OR_DEPARTMENT_OTHER): Payer: Medicare Other | Admitting: Oncology

## 2015-08-30 ENCOUNTER — Encounter: Payer: Self-pay | Admitting: Oncology

## 2015-08-30 ENCOUNTER — Encounter: Payer: Self-pay | Admitting: *Deleted

## 2015-08-30 ENCOUNTER — Inpatient Hospital Stay: Payer: Medicare Other

## 2015-08-30 VITALS — BP 119/77 | HR 87 | Temp 96.2°F | Wt 134.4 lb

## 2015-08-30 DIAGNOSIS — C50211 Malignant neoplasm of upper-inner quadrant of right female breast: Secondary | ICD-10-CM

## 2015-08-30 DIAGNOSIS — Z170 Estrogen receptor positive status [ER+]: Secondary | ICD-10-CM

## 2015-08-30 DIAGNOSIS — C50011 Malignant neoplasm of nipple and areola, right female breast: Secondary | ICD-10-CM

## 2015-08-30 DIAGNOSIS — I10 Essential (primary) hypertension: Secondary | ICD-10-CM

## 2015-08-30 DIAGNOSIS — Z79899 Other long term (current) drug therapy: Secondary | ICD-10-CM

## 2015-08-30 DIAGNOSIS — Z9223 Personal history of estrogen therapy: Secondary | ICD-10-CM

## 2015-08-30 DIAGNOSIS — Z5111 Encounter for antineoplastic chemotherapy: Secondary | ICD-10-CM | POA: Diagnosis not present

## 2015-08-30 DIAGNOSIS — Z90710 Acquired absence of both cervix and uterus: Secondary | ICD-10-CM

## 2015-08-30 DIAGNOSIS — E785 Hyperlipidemia, unspecified: Secondary | ICD-10-CM

## 2015-08-30 MED ORDER — PALONOSETRON HCL INJECTION 0.25 MG/5ML
0.2500 mg | Freq: Once | INTRAVENOUS | Status: AC
  Administered 2015-08-30: 0.25 mg via INTRAVENOUS
  Filled 2015-08-30: qty 5

## 2015-08-30 MED ORDER — SODIUM CHLORIDE 0.9 % IV SOLN
600.0000 mg/m2 | Freq: Once | INTRAVENOUS | Status: AC
  Administered 2015-08-30: 980 mg via INTRAVENOUS
  Filled 2015-08-30: qty 49

## 2015-08-30 MED ORDER — SODIUM CHLORIDE 0.9 % IV SOLN
Freq: Once | INTRAVENOUS | Status: AC
  Administered 2015-08-30: 10:00:00 via INTRAVENOUS
  Filled 2015-08-30: qty 5

## 2015-08-30 MED ORDER — SODIUM CHLORIDE 0.9 % IV SOLN
Freq: Once | INTRAVENOUS | Status: AC
  Administered 2015-08-30: 09:00:00 via INTRAVENOUS
  Filled 2015-08-30: qty 1000

## 2015-08-30 MED ORDER — SODIUM CHLORIDE 0.9 % IJ SOLN
10.0000 mL | INTRAMUSCULAR | Status: DC | PRN
  Administered 2015-08-30: 10 mL
  Filled 2015-08-30: qty 10

## 2015-08-30 MED ORDER — ONDANSETRON HCL 4 MG PO TABS: 4.0000 mg | ORAL_TABLET | Freq: Four times a day (QID) | ORAL | Status: DC | PRN

## 2015-08-30 MED ORDER — HEPARIN SOD (PORK) LOCK FLUSH 100 UNIT/ML IV SOLN
500.0000 [IU] | Freq: Once | INTRAVENOUS | Status: AC | PRN
  Administered 2015-08-30: 500 [IU]
  Filled 2015-08-30: qty 5

## 2015-08-30 MED ORDER — DOXORUBICIN HCL CHEMO IV INJECTION 2 MG/ML
60.0000 mg/m2 | Freq: Once | INTRAVENOUS | Status: AC
  Administered 2015-08-30: 98 mg via INTRAVENOUS
  Filled 2015-08-30: qty 49

## 2015-08-30 MED ORDER — PEGFILGRASTIM 6 MG/0.6ML ~~LOC~~ PSKT
6.0000 mg | PREFILLED_SYRINGE | Freq: Once | SUBCUTANEOUS | Status: AC
  Administered 2015-08-30: 6 mg via SUBCUTANEOUS
  Filled 2015-08-30: qty 0.6

## 2015-08-30 NOTE — Progress Notes (Signed)
Labs from 08-18-15 reviewed by MD, Dr. Oliva Bustard. Per MD, Dr. Oliva Bustard, order: no new lab orders needed today. Proceed with chemotherapy treatment.

## 2015-08-30 NOTE — Progress Notes (Signed)
  Oncology Nurse Navigator Documentation  Navigator Location: [facility] Onc (08/30/15 1400) Navigator Encounter Type: Treatment (08/30/15 1400)           Patient Visit Type: MedOnc (08/30/15 1400) Treatment Phase: First Chemo Tx (08/30/15 1400) Barriers/Navigation Needs: No Needs (08/30/15 1400)   Interventions: Other (support) (08/30/15 1400)            Acuity: Level 2 (08/30/15 1400)         Time Spent with Patient: 15 (08/30/15 1400)  Met patient and husband today prior to her chemo treatment.  Offered support.  She is to call if she has any questions or needs.

## 2015-08-30 NOTE — Progress Notes (Signed)
Pt start chemo tomorrow (08/30/15) and does not have emla cream. emla cream escribed to pharmacy.  [place] @ [facility] Telephone:([phone]) [phone]  Fax:(336) [place] OB: [birth year]  MR#: [id number]  MWN#:[id number]  Patient Care Team: Lucille Passy, MD as PCP - General (Family Medicine) Glennie Isle, PA-C (Physician Assistant) Jerene Bears, MD as Consulting Physician (Gastroenterology) Lucille Passy, MD as Consulting Physician (Family Medicine) Seeplaputhur Robinette Haines, MD (General Surgery)  CHIEF COMPLAINT:  Chief Complaint  Patient presents with  . 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  Patient started Cytoxan and Adriamycin on now August 30, 2015 MUGA scan shows ejection fraction of baseline 55% VISIT DIAGNOSIS:     ICD-9-CM ICD-10-CM   1. 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    Oncology Flowsheet 07/13/2015  dexamethasone (DECADRON) IV -  ondansetron (ZOFRAN) IV -    INTERVAL HISTORY: 70 year old lady who has no family history of breast cancer.  Had a distant history of estrogen replacement therapy only for 1 year.  Had abnormal mammogram which was evaluated underwent biopsy which was positive for invasive mammary carcinoma patient underwent lumpectomy and sentinel lymph node biopsy.  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  Patient is here to discuss the result and  further planning of treatment. And is accompanied with family member Patient had ejection fraction of 55%.  MUGA scan of the heart has been reviewed independently.  Patient also had a port placement.  Here to discuss and start chemotherapy.  Had number of questions regarding chemotherapy. REVIEW OF SYSTEMS:   GENERAL:  Feels good.  Active.  No fevers, sweats or weight loss. PERFORMANCE STATUS (ECOG):0 HEENT:  No visual changes, runny nose, sore throat, mouth sores or tenderness. Lungs: No shortness of breath or cough.  No hemoptysis. Cardiac:  No chest pain, palpitations, orthopnea, or PND. GI:  No nausea, vomiting, diarrhea, constipation, melena or hematochezia. GU:  No urgency, frequency, dysuria, or hematuria. Musculoskeletal:  No back pain.  No joint pain.  No muscle tenderness. Extremities:  No pain or swelling. Skin:  No rashes or skin changes. Neuro:  No headache, numbness or weakness, balance or coordination issues. Endocrine:  No diabetes, thyroid issues, hot flashes or night sweats. Psych:  No mood changes, depression or anxiety. Pain:  No focal pain. Review of systems:  All other systems reviewed and found to be negative.  As per HPI. Otherwise, a complete review of systems is negatve.  PAST MEDICAL HISTORY: Past Medical History  Diagnosis Date  . Hypertension   . Hyperlipidemia   . Cancer ([place])   . Visual blurriness     SOMETHING NEW THAT HAS DEVELOPED SINCE 07-13-15 SURGERY- SEEN AT [facility] ENT AND EXAM WAS NORMAL PER PT (08-09-15)    PAST SURGICAL HISTORY: Past Surgical History  Procedure Laterality Date  . Abdominal hysterectomy  1987  . Fatty tumor  Left 2013    side  . Bunionectomy Right 1993  . Breast biopsy Right     neg  . Breast biopsy Left     4 oclock, FIBROEPITHELIAL LESION FAVOR CELLULAR FIBROADENOMA  . Breast biopsy Left     2 oclock, CYSTIC APOCRINE METAPLASIA  . Breast biopsy Right 07/13/15    130 INVASIVE MAMMARY CARCINOMA, NO SPECIAL TYPE  .  Breast lumpectomy with sentinel lymph node biopsy Right 07/13/2015    Procedure: BREAST LUMPECTOMY WITH SENTINEL LYMPH NODE BX;  Surgeon: Christene Lye, MD;  Location: [facility] ORS;  Service: General;  Laterality: Right;  . Breast lumpectomy Left 07/13/2015    Procedure: BREAST LUMPECTOMY;  Surgeon: Christene Lye, MD;  Location: [facility] ORS;  Service: General;  Laterality: Left;  . Portacath placement Left 08/16/2015    Procedure: INSERTION PORT-A-CATH;  Surgeon: Christene Lye, MD;  Location: [facility] ORS;  Service: General;  Laterality: Left;    FAMILY HISTORY Family History  Problem Relation Age of Onset  . Cancer Sister 44    breast cancer  . Colon cancer Neg Hx     GYNECOLOGIC HISTORY:  No LMP recorded. Patient has had a hysterectomy.     ADVANCED DIRECTIVES:    HEALTH MAINTENANCE: Social History  Substance Use Topics  . Smoking status: Never Smoker   . Smokeless tobacco: Never Used  . Alcohol Use: No     Colonoscopy:  PAP:  Bone density:  Lipid panel:  Allergies  Allergen Reactions  . Lovastatin Anaphylaxis    Tongue swelling    Current Outpatient Prescriptions  Medication Sig Dispense Refill  . amLODipine (NORVASC) 5 MG tablet TAKE ONE TABLET BY MOUTH EVERY DAY (Patient taking differently: Take 5 mg by mouth every morning. TAKE ONE TABLET BY MOUTH EVERY DAY) 90 tablet 3  . cholecalciferol (VITAMIN D) 1000 units tablet Take 1,000 Units by mouth daily.    . hydrochlorothiazide (HYDRODIURIL) 25 MG tablet TAKE ONE TABLET BY MOUTH ONCE DAILY 90 tablet 0  . lidocaine-prilocaine (EMLA) cream Apply to port site 1-2 hours prior to port being accessed.  Cover with plastic wrap. 1 each 3  . Potassium Chloride ER 20 MEQ TBCR Take 1 tablet by mouth daily.    . pravastatin (PRAVACHOL) 10 MG tablet Take 1 tablet (10 mg total) by mouth daily. (Patient taking differently: Take 10 mg by mouth at bedtime. ) 90 tablet 3  . ondansetron (ZOFRAN) 4 MG tablet Take 1 tablet (4  mg total) by mouth every 6 (six) hours as needed. 30 tablet 3   No current facility-administered medications for this visit.    OBJECTIVE: PHYSICAL EXAM: GENERAL:  Well developed, well nourished, sitting comfortably in the exam room in no acute distress. MENTAL STATUS:  Alert and oriented to person, place and time.  ENT:  Oropharynx clear without lesion.  Tongue normal. Mucous membranes moist.  RESPIRATORY:  Clear to auscultation without rales, wheezes or rhonchi. Patient has a port placed on the left upper chest wall area.  No evidence of redness. CARDIOVASCULAR:  Regular rate and rhythm without murmur, rub or gallop. BREAST:  Right breast without masses, wound has healed well.  skin changes or nipple discharge.  Left breast without masses, skin changes or nipple discharge. ABDOMEN:  Soft, non-tender, with active bowel sounds, and no hepatosplenomegaly.  No masses. BACK:  No CVA tenderness.  No tenderness on percussion of the back or rib cage. SKIN:  No rashes, ulcers or lesions. EXTREMITIES: No  edema, no skin discoloration or tenderness.  No palpable cords. LYMPH NODES: No palpable cervical, supraclavicular, axillary or inguinal adenopathy  NEUROLOGICAL: Unremarkable. PSYCH:  Appropriate.  Filed Vitals:   08/30/15 0826  BP: 119/77  Pulse: 87  Temp: 96.2 F (35.7 C)     Body mass index is 24.57 kg/(m^2).    ECOG FS:0 - Asymptomatic  LAB RESULTS:  No visits with results within 5 Day(s) from this visit. Latest known visit with results is:  Appointment on 08/18/2015  Component Date Value Ref Range Status  . WBC 08/18/2015 3.8  3.6 - 11.0 K/uL Final  . RBC 08/18/2015 4.57  3.80 - 5.20 MIL/uL Final  . Hemoglobin 08/18/2015 13.4  12.0 - 16.0 g/dL Final  . HCT 08/18/2015 40.2  35.0 - 47.0 % Final  . MCV 08/18/2015 88.0  80.0 - 100.0 fL Final  . MCH 08/18/2015 29.4  26.0 - 34.0 pg Final  . MCHC 08/18/2015 33.4  32.0 - 36.0 g/dL Final  . RDW 08/18/2015 12.8  11.5 - 14.5 % Final    . Platelets 08/18/2015 297  150 - 440 K/uL Final  . Neutrophils Relative % 08/18/2015 61   Final  . Neutro Abs 08/18/2015 2.3  1.4 - 6.5 K/uL Final  . Lymphocytes Relative 08/18/2015 28   Final  . Lymphs Abs 08/18/2015 1.0  1.0 - 3.6 K/uL Final  . Monocytes Relative 08/18/2015 7   Final  . Monocytes Absolute 08/18/2015 0.3  0.2 - 0.9 K/uL Final  . Eosinophils Relative 08/18/2015 3   Final  . Eosinophils Absolute 08/18/2015 0.1  0 - 0.7 K/uL Final  . Basophils Relative 08/18/2015 1   Final  . Basophils Absolute 08/18/2015 0.0  0 - 0.1 K/uL Final  . Sodium 08/18/2015 136  135 - 145 mmol/L Final  . Potassium 08/18/2015 3.5  3.5 - 5.1 mmol/L Final  . Chloride 08/18/2015 103  101 - 111 mmol/L Final  . CO2 08/18/2015 28  22 - 32 mmol/L Final  . Glucose, Bld 08/18/2015 113* 65 - 99 mg/dL Final  . BUN 08/18/2015 12  6 - 20 mg/dL Final  . Creatinine, Ser 08/18/2015 0.84  0.44 - 1.00 mg/dL Final  . Calcium 08/18/2015 9.2  8.9 - 10.3 mg/dL Final  . Total Protein 08/18/2015 7.3  6.5 - 8.1 g/dL Final  . Albumin 08/18/2015 4.2  3.5 - 5.0 g/dL Final  . AST 08/18/2015 21  15 - 41 U/L Final  . ALT 08/18/2015 15  14 - 54 U/L Final  . Alkaline Phosphatase 08/18/2015 56  38 - 126 U/L Final  . Total Bilirubin 08/18/2015 0.7  0.3 - 1.2 mg/dL Final  . GFR calc non Af Amer 08/18/2015 >60  >60 mL/min Final  . GFR calc Af Amer 08/18/2015 >60  >60 mL/min Final   Comment: (NOTE) The eGFR has been calculated using the CKD EPI equation. This calculation has not been validated in all clinical situations. eGFR's persistently <60 mL/min signify possible Chronic Kidney Disease.   . Anion gap 08/18/2015 5  5 - 15 Final  . Magnesium 08/18/2015 1.9  1.7 - 2.4 mg/dL Final     STUDIES: Nm Cardiac Muga Rest  08/28/2015  CLINICAL DATA:  Breast cancer. Evaluate cardiac function in relation to chemotherapy. EXAM: NUCLEAR MEDICINE CARDIAC BLOOD POOL IMAGING (MUGA) TECHNIQUE: Cardiac multi-gated acquisition was  performed at rest following intravenous injection of Tc-14mlabeled red blood cells. RADIOPHARMACEUTICALS:  18.8 mCi Tc-942mDP in-vitro labeled red blood cells  IV COMPARISON:  None. FINDINGS: No  focal wall motion abnormality of the left ventricle. Calculated left ventricular ejection fraction equals 55% IMPRESSION: Left ventricular ejection fraction equals 55 %. Electronically Signed   By: Suzy Bouchard M.D.   On: 08/28/2015 10:09   Dg Chest Port 1 View  08/16/2015  CLINICAL DATA:  Porta catheter. EXAM: PORTABLE CHEST 1 VIEW COMPARISON:  None. FINDINGS: Left IJ porta catheter with tip at the upper right atrium. No pneumothorax or concerning mediastinal widening. Normal heart size for technique. Mild aortic tortuosity. There is no edema, consolidation, or effusion IMPRESSION: No acute finding after left porta catheter placement. Electronically Signed   By: Monte Fantasia M.D.   On: 08/16/2015 12:30   Dg C-arm 1-60 Min-no Report  08/16/2015  CLINICAL DATA: port placement C-ARM 1-60 MINUTES Fluoroscopy was utilized by the requesting physician.  No radiographic interpretation.    ASSESSMENT: cancer   of right breast stage IC disease.  Estrogen receptor progesterone receptor positive HER-2 receptor negative.  With Mammo print score is high risk with average risk of 22% at 5 years distant recurrent disease or 29% at that 10 years. I  Recommendation would include.  Chemotherapy with Cytoxan and Adriamycin every 3 weeks 4 followed by Taxol   X  12  followed by Radiation therapy to the right breast  followed by anti-hormonal therapy with aromatase inhibitor. Patient was explained all the side effects of chemotherapy.  And informed consent has been obtained Intent of chemotherapy   is   Cure Start chemotherapy with Cytoxan and Adriamycin MUGA scan of the heart has been reviewed independently All lab data has been reviewed Duration of visit is 25 minutes and 50% of time was spent discussing side  effect of chemotherapy.  Prevention of nausea and vomiting.   PLAN:  No problem-specific assessment & plan notes found for this encounter.   Patient expressed understanding and was in agreement with this plan. She also understands that She can call clinic at any time with any questions, concerns, or complaints.    No matching staging information was found for the patient.  Forest Gleason, MD   08/30/2015 8:40 AM

## 2015-09-06 ENCOUNTER — Inpatient Hospital Stay: Payer: Medicare Other | Attending: Oncology

## 2015-09-06 DIAGNOSIS — Z803 Family history of malignant neoplasm of breast: Secondary | ICD-10-CM | POA: Insufficient documentation

## 2015-09-06 DIAGNOSIS — Z79899 Other long term (current) drug therapy: Secondary | ICD-10-CM | POA: Diagnosis not present

## 2015-09-06 DIAGNOSIS — T458X5A Adverse effect of other primarily systemic and hematological agents, initial encounter: Secondary | ICD-10-CM | POA: Insufficient documentation

## 2015-09-06 DIAGNOSIS — Z5111 Encounter for antineoplastic chemotherapy: Secondary | ICD-10-CM | POA: Insufficient documentation

## 2015-09-06 DIAGNOSIS — M791 Myalgia: Secondary | ICD-10-CM | POA: Diagnosis not present

## 2015-09-06 DIAGNOSIS — H538 Other visual disturbances: Secondary | ICD-10-CM | POA: Diagnosis not present

## 2015-09-06 DIAGNOSIS — E785 Hyperlipidemia, unspecified: Secondary | ICD-10-CM | POA: Diagnosis not present

## 2015-09-06 DIAGNOSIS — C50011 Malignant neoplasm of nipple and areola, right female breast: Secondary | ICD-10-CM | POA: Diagnosis present

## 2015-09-06 DIAGNOSIS — I10 Essential (primary) hypertension: Secondary | ICD-10-CM | POA: Diagnosis not present

## 2015-09-06 DIAGNOSIS — Z9223 Personal history of estrogen therapy: Secondary | ICD-10-CM | POA: Diagnosis not present

## 2015-09-06 DIAGNOSIS — Z170 Estrogen receptor positive status [ER+]: Secondary | ICD-10-CM | POA: Insufficient documentation

## 2015-09-06 DIAGNOSIS — Z7689 Persons encountering health services in other specified circumstances: Secondary | ICD-10-CM | POA: Diagnosis not present

## 2015-09-06 DIAGNOSIS — Z90710 Acquired absence of both cervix and uterus: Secondary | ICD-10-CM | POA: Diagnosis not present

## 2015-09-06 LAB — CBC WITH DIFFERENTIAL/PLATELET
BASOS ABS: 0 10*3/uL (ref 0–0.1)
Basophils Relative: 1 %
Eosinophils Absolute: 0.1 10*3/uL (ref 0–0.7)
HEMATOCRIT: 34.8 % — AB (ref 35.0–47.0)
HEMOGLOBIN: 11.8 g/dL — AB (ref 12.0–16.0)
LYMPHS ABS: 0.5 10*3/uL — AB (ref 1.0–3.6)
MCH: 29.5 pg (ref 26.0–34.0)
MCHC: 34.1 g/dL (ref 32.0–36.0)
MCV: 86.6 fL (ref 80.0–100.0)
MONO ABS: 0.1 10*3/uL — AB (ref 0.2–0.9)
NEUTROS ABS: 0.6 10*3/uL — AB (ref 1.4–6.5)
Neutrophils Relative %: 44 %
Platelets: 193 10*3/uL (ref 150–440)
RBC: 4.02 MIL/uL (ref 3.80–5.20)
RDW: 12.5 % (ref 11.5–14.5)
WBC: 1.3 10*3/uL — CL (ref 3.6–11.0)

## 2015-09-06 LAB — COMPREHENSIVE METABOLIC PANEL
ALBUMIN: 3.8 g/dL (ref 3.5–5.0)
ALK PHOS: 55 U/L (ref 38–126)
ALT: 13 U/L — AB (ref 14–54)
ANION GAP: 7 (ref 5–15)
AST: 15 U/L (ref 15–41)
BILIRUBIN TOTAL: 0.7 mg/dL (ref 0.3–1.2)
BUN: 14 mg/dL (ref 6–20)
CALCIUM: 8.4 mg/dL — AB (ref 8.9–10.3)
CO2: 26 mmol/L (ref 22–32)
CREATININE: 0.74 mg/dL (ref 0.44–1.00)
Chloride: 100 mmol/L — ABNORMAL LOW (ref 101–111)
GFR calc Af Amer: >60 mL/min (ref >60)
GFR calc non Af Amer: >60 mL/min (ref >60)
GLUCOSE: 115 mg/dL — AB (ref 65–99)
Potassium: 3 mmol/L — ABNORMAL LOW (ref 3.5–5.1)
Sodium: 133 mmol/L — ABNORMAL LOW (ref 135–145)
TOTAL PROTEIN: 6.4 g/dL — AB (ref 6.5–8.1)

## 2015-09-13 ENCOUNTER — Inpatient Hospital Stay: Payer: Medicare Other

## 2015-09-13 DIAGNOSIS — C50011 Malignant neoplasm of nipple and areola, right female breast: Secondary | ICD-10-CM

## 2015-09-13 LAB — CBC WITH DIFFERENTIAL/PLATELET
BASOS ABS: 0.1 10*3/uL (ref 0–0.1)
Basophils Relative: 1 %
Eosinophils Absolute: 0 10*3/uL (ref 0–0.7)
Eosinophils Relative: 1 %
HEMATOCRIT: 35.4 % (ref 35.0–47.0)
Hemoglobin: 12.2 g/dL (ref 12.0–16.0)
LYMPHS PCT: 20 %
Lymphs Abs: 1 10*3/uL (ref 1.0–3.6)
MCH: 29.8 pg (ref 26.0–34.0)
MCHC: 34.4 g/dL (ref 32.0–36.0)
MCV: 86.8 fL (ref 80.0–100.0)
Monocytes Absolute: 0.4 10*3/uL (ref 0.2–0.9)
Monocytes Relative: 8 %
NEUTROS ABS: 3.6 10*3/uL (ref 1.4–6.5)
NEUTROS PCT: 70 %
Platelets: 235 10*3/uL (ref 150–440)
RBC: 4.08 MIL/uL (ref 3.80–5.20)
RDW: 12.6 % (ref 11.5–14.5)
WBC: 5.2 10*3/uL (ref 3.6–11.0)

## 2015-09-20 ENCOUNTER — Inpatient Hospital Stay (HOSPITAL_BASED_OUTPATIENT_CLINIC_OR_DEPARTMENT_OTHER): Payer: Medicare Other | Admitting: Oncology

## 2015-09-20 ENCOUNTER — Encounter: Payer: Self-pay | Admitting: Oncology

## 2015-09-20 ENCOUNTER — Inpatient Hospital Stay: Payer: Medicare Other

## 2015-09-20 ENCOUNTER — Inpatient Hospital Stay (HOSPITAL_BASED_OUTPATIENT_CLINIC_OR_DEPARTMENT_OTHER): Payer: Medicare Other

## 2015-09-20 VITALS — BP 130/84 | HR 88 | Temp 95.8°F | Resp 18 | Wt 133.6 lb

## 2015-09-20 DIAGNOSIS — Z803 Family history of malignant neoplasm of breast: Secondary | ICD-10-CM

## 2015-09-20 DIAGNOSIS — Z79899 Other long term (current) drug therapy: Secondary | ICD-10-CM

## 2015-09-20 DIAGNOSIS — C50211 Malignant neoplasm of upper-inner quadrant of right female breast: Secondary | ICD-10-CM

## 2015-09-20 DIAGNOSIS — C50011 Malignant neoplasm of nipple and areola, right female breast: Secondary | ICD-10-CM

## 2015-09-20 DIAGNOSIS — M791 Myalgia: Secondary | ICD-10-CM | POA: Diagnosis not present

## 2015-09-20 DIAGNOSIS — I10 Essential (primary) hypertension: Secondary | ICD-10-CM

## 2015-09-20 DIAGNOSIS — Z170 Estrogen receptor positive status [ER+]: Secondary | ICD-10-CM

## 2015-09-20 DIAGNOSIS — T458X5A Adverse effect of other primarily systemic and hematological agents, initial encounter: Secondary | ICD-10-CM | POA: Diagnosis not present

## 2015-09-20 DIAGNOSIS — H538 Other visual disturbances: Secondary | ICD-10-CM

## 2015-09-20 DIAGNOSIS — Z90710 Acquired absence of both cervix and uterus: Secondary | ICD-10-CM

## 2015-09-20 DIAGNOSIS — E785 Hyperlipidemia, unspecified: Secondary | ICD-10-CM

## 2015-09-20 DIAGNOSIS — Z9223 Personal history of estrogen therapy: Secondary | ICD-10-CM

## 2015-09-20 LAB — CBC WITH DIFFERENTIAL/PLATELET
BASOS ABS: 0.1 10*3/uL (ref 0–0.1)
BASOS PCT: 4 %
EOS ABS: 0 10*3/uL (ref 0–0.7)
Eosinophils Relative: 1 %
HCT: 35.4 % (ref 35.0–47.0)
HEMOGLOBIN: 12.2 g/dL (ref 12.0–16.0)
LYMPHS ABS: 0.8 10*3/uL — AB (ref 1.0–3.6)
Lymphocytes Relative: 27 %
MCH: 29.9 pg (ref 26.0–34.0)
MCHC: 34.4 g/dL (ref 32.0–36.0)
MCV: 87.1 fL (ref 80.0–100.0)
Monocytes Absolute: 0.5 10*3/uL (ref 0.2–0.9)
Monocytes Relative: 18 %
NEUTROS PCT: 50 %
Neutro Abs: 1.4 10*3/uL (ref 1.4–6.5)
PLATELETS: 495 10*3/uL — AB (ref 150–440)
RBC: 4.07 MIL/uL (ref 3.80–5.20)
RDW: 13.4 % (ref 11.5–14.5)
WBC: 2.8 10*3/uL — AB (ref 3.6–11.0)

## 2015-09-20 MED ORDER — SODIUM CHLORIDE 0.9 % IJ SOLN
10.0000 mL | INTRAMUSCULAR | Status: DC | PRN
  Filled 2015-09-20: qty 10

## 2015-09-20 MED ORDER — HEPARIN SOD (PORK) LOCK FLUSH 100 UNIT/ML IV SOLN
500.0000 [IU] | Freq: Once | INTRAVENOUS | Status: DC | PRN
  Filled 2015-09-20: qty 5

## 2015-09-20 MED ORDER — SODIUM CHLORIDE 0.9 % IV SOLN
600.0000 mg/m2 | Freq: Once | INTRAVENOUS | Status: AC
  Administered 2015-09-20: 980 mg via INTRAVENOUS
  Filled 2015-09-20: qty 49

## 2015-09-20 MED ORDER — SODIUM CHLORIDE 0.9% FLUSH
10.0000 mL | INTRAVENOUS | Status: DC | PRN
  Administered 2015-09-20: 10 mL via INTRAVENOUS
  Filled 2015-09-20: qty 10

## 2015-09-20 MED ORDER — PEGFILGRASTIM 6 MG/0.6ML ~~LOC~~ PSKT
6.0000 mg | PREFILLED_SYRINGE | Freq: Once | SUBCUTANEOUS | Status: AC
  Administered 2015-09-20: 6 mg via SUBCUTANEOUS
  Filled 2015-09-20: qty 0.6

## 2015-09-20 MED ORDER — HEPARIN SOD (PORK) LOCK FLUSH 100 UNIT/ML IV SOLN
500.0000 [IU] | Freq: Once | INTRAVENOUS | Status: AC
  Administered 2015-09-20: 500 [IU] via INTRAVENOUS

## 2015-09-20 MED ORDER — SODIUM CHLORIDE 0.9 % IV SOLN
Freq: Once | INTRAVENOUS | Status: AC
  Administered 2015-09-20: 10:00:00 via INTRAVENOUS
  Filled 2015-09-20: qty 1000

## 2015-09-20 MED ORDER — FOSAPREPITANT DIMEGLUMINE INJECTION 150 MG
Freq: Once | INTRAVENOUS | Status: AC
  Administered 2015-09-20: 11:00:00 via INTRAVENOUS
  Filled 2015-09-20: qty 5

## 2015-09-20 MED ORDER — PALONOSETRON HCL INJECTION 0.25 MG/5ML
0.2500 mg | Freq: Once | INTRAVENOUS | Status: AC
  Administered 2015-09-20: 0.25 mg via INTRAVENOUS
  Filled 2015-09-20: qty 5

## 2015-09-20 MED ORDER — DOXORUBICIN HCL CHEMO IV INJECTION 2 MG/ML
60.0000 mg/m2 | Freq: Once | INTRAVENOUS | Status: AC
  Administered 2015-09-20: 98 mg via INTRAVENOUS
  Filled 2015-09-20: qty 49

## 2015-09-20 NOTE — Progress Notes (Signed)
Pt start chemo tomorrow (08/30/15) and does not have emla cream. emla cream escribed to pharmacy.  [place] @ [facility] Telephone:(336) [phone]  Fax:(336) [place] OB: 07/11/1946  MR#: [id number]  PYP#:[id number]  Patient Care Team: Lucille Passy, MD as PCP - General (Family Medicine) Glennie [place], PA-C (Physician Assistant) Jerene Bears, MD as Consulting Physician (Gastroenterology) Lucille Passy, MD as Consulting Physician (Family Medicine) Seeplaputhur Robinette Haines, MD (General Surgery)  CHIEF COMPLAINT:  Chief Complaint  Patient presents with  . 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  Patient started Cytoxan and Adriamycin on now August 30, 2015 MUGA scan shows ejection fraction of baseline 55% VISIT DIAGNOSIS:     ICD-9-CM ICD-10-CM   1. 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    Oncology Flowsheet 07/13/2015 08/30/2015 09/20/2015  Day, Cycle - Day 1, Cycle 1 Day 1, Cycle 2  cyclophosphamide (CYTOXAN) IV - 600 mg/m2 -  dexamethasone (DECADRON) IV - [ 12 mg ] [ 12 mg ]  DOXOrubicin (ADRIAMYCIN) IV - 60 mg/m2 -  fosaprepitant (EMEND) IV - [ 150 mg ] [ 150 mg ]  ondansetron (ZOFRAN) IV - - -  palonosetron (ALOXI) IV - 0.25 mg 0.25 mg  pegfilgrastim (NEULASTA ONPRO KIT) [place] - 6 mg -    INTERVAL HISTORY: 70 year old lady who has no family history of breast cancer.  Had a distant history of estrogen replacement therapy only for 1 year.  Had abnormal mammogram which was evaluated underwent biopsy  which was positive for invasive mammary carcinoma patient underwent lumpectomy and sentinel lymph node biopsy.  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  Patient is here to discuss the result and further planning of treatment. And is accompanied with family member Patient had ejection fraction of 55%.  MUGA scan of the heart has been reviewed independently.  Patient also had a port placement.  Here to discuss and start chemotherapy.  Had number of questions regarding chemotherapy. REVIEW OF SYSTEMS:   GENERAL:  Feels good.  Active.  No fevers, sweats or weight loss. PERFORMANCE STATUS (ECOG):0 HEENT:  No visual changes, runny nose, sore throat, mouth sores or tenderness. Lungs: No shortness of breath or cough.  No hemoptysis. Cardiac:  No chest pain, palpitations, orthopnea, or PND. GI:  No nausea, vomiting, diarrhea, constipation, melena or hematochezia. GU:  No urgency, frequency, dysuria, or hematuria. Musculoskeletal:  No back pain.  No joint pain.  No muscle tenderness. Extremities:  No pain or swelling. Skin:  No rashes or skin changes. Neuro:  No headache, numbness or weakness, balance or coordination issues. Endocrine:  No diabetes, thyroid issues, hot flashes or night sweats. Psych:  No mood changes, depression or anxiety. Pain:  No focal pain. Review of systems:  All other systems reviewed and found to be negative.  As per HPI. Otherwise, a complete review of systems is negatve.  PAST MEDICAL HISTORY: Past Medical History  Diagnosis Date  .  Hypertension   . Hyperlipidemia   . Cancer ([place])   . Visual blurriness     SOMETHING NEW THAT HAS DEVELOPED SINCE 07-13-15 SURGERY- SEEN AT [facility] ENT AND EXAM WAS NORMAL PER PT (08-09-15)    PAST SURGICAL HISTORY: Past Surgical History  Procedure Laterality Date  . Abdominal hysterectomy  1987  . Fatty tumor Left 2013    side  . Bunionectomy Right 1993    . Breast biopsy Right     neg  . Breast biopsy Left     4 oclock, FIBROEPITHELIAL LESION FAVOR CELLULAR FIBROADENOMA  . Breast biopsy Left     2 oclock, CYSTIC APOCRINE METAPLASIA  . Breast biopsy Right 07/13/15    130 INVASIVE MAMMARY CARCINOMA, NO SPECIAL TYPE  . Breast lumpectomy with sentinel lymph node biopsy Right 07/13/2015    Procedure: BREAST LUMPECTOMY WITH SENTINEL LYMPH NODE BX;  Surgeon: Christene Lye, MD;  Location: [facility] ORS;  Service: General;  Laterality: Right;  . Breast lumpectomy Left 07/13/2015    Procedure: BREAST LUMPECTOMY;  Surgeon: Christene Lye, MD;  Location: [facility] ORS;  Service: General;  Laterality: Left;  . Portacath placement Left 08/16/2015    Procedure: INSERTION PORT-A-CATH;  Surgeon: Christene Lye, MD;  Location: [facility] ORS;  Service: General;  Laterality: Left;    FAMILY HISTORY Family History  Problem Relation Age of Onset  . Cancer Sister 62    breast cancer  . Colon cancer Neg Hx         ADVANCED DIRECTIVES:   Patient does not have any living will or healthcare power of attorney.  Information was given .  Available resources had been discussed.  We will follow-up on subsequent appointments regarding this issue HEALTH MAINTENANCE: Social History  Substance Use Topics  . Smoking status: Never Smoker   . Smokeless tobacco: Never Used  . Alcohol Use: No     Colonoscopy:  PAP:  Bone density:  Lipid panel:  Allergies  Allergen Reactions  . Lovastatin Anaphylaxis    Tongue swelling    Current Outpatient Prescriptions  Medication Sig Dispense Refill  . amLODipine (NORVASC) 5 MG tablet TAKE ONE TABLET BY MOUTH EVERY DAY (Patient taking differently: Take 5 mg by mouth every morning. TAKE ONE TABLET BY MOUTH EVERY DAY) 90 tablet 3  . cholecalciferol (VITAMIN D) 1000 units tablet Take 1,000 Units by mouth daily.    . hydrochlorothiazide (HYDRODIURIL) 25 MG tablet TAKE ONE TABLET BY MOUTH ONCE DAILY 90 tablet 0  .  lidocaine-prilocaine (EMLA) cream Apply to port site 1-2 hours prior to port being accessed.  Cover with plastic wrap. 1 each 3  . ondansetron (ZOFRAN) 4 MG tablet Take 1 tablet (4 mg total) by mouth every 6 (six) hours as needed. 30 tablet 3  . Potassium Chloride ER 20 MEQ TBCR Take 1 tablet by mouth daily.    . pravastatin (PRAVACHOL) 10 MG tablet Take 1 tablet (10 mg total) by mouth daily. (Patient taking differently: Take 10 mg by mouth at bedtime. ) 90 tablet 3   No current facility-administered medications for this visit.   Facility-Administered Medications Ordered in Other Visits  Medication Dose Route Frequency Provider Last Rate Last Dose  . cyclophosphamide (CYTOXAN) 980 mg in sodium chloride 0.9 % 250 mL chemo infusion  600 mg/m2 (Treatment Plan Actual) Intravenous Once Forest Gleason, MD      . DOXOrubicin (ADRIAMYCIN) chemo injection 98 mg  60 mg/m2 (Treatment Plan Actual) Intravenous Once Forest Gleason, MD      .  fosaprepitant (EMEND) 150 mg, dexamethasone (DECADRON) 12 mg in sodium chloride 0.9 % 145 mL IVPB   Intravenous Once Forest Gleason, MD 454 mL/hr at 09/20/15 1030    . heparin lock flush 100 unit/mL  500 Units Intravenous Once Forest Gleason, MD      . heparin lock flush 100 unit/mL  500 Units Intracatheter Once PRN Forest Gleason, MD      . pegfilgrastim (NEULASTA ONPRO KIT) injection 6 mg  6 mg Subcutaneous Once Forest Gleason, MD      . sodium chloride 0.9 % injection 10 mL  10 mL Intracatheter PRN Forest Gleason, MD      . sodium chloride flush (NS) 0.9 % injection 10 mL  10 mL Intravenous PRN Forest Gleason, MD   10 mL at 09/20/15 0909    OBJECTIVE: PHYSICAL EXAM: GENERAL:  Well developed, well nourished, sitting comfortably in the exam room in no acute distress. MENTAL STATUS:  Alert and oriented to person, place and time.  ENT:  Oropharynx clear without lesion.  Tongue normal. Mucous membranes moist.  RESPIRATORY:  Clear to auscultation without rales, wheezes or  rhonchi. Patient has a port placed on the left upper chest wall area.  No evidence of redness. CARDIOVASCULAR:  Regular rate and rhythm without murmur, rub or gallop. BREAST:  Right breast without masses, wound has healed well.  skin changes or nipple discharge.  Left breast without masses, skin changes or nipple discharge. ABDOMEN:  Soft, non-tender, with active bowel sounds, and no hepatosplenomegaly.  No masses. BACK:  No CVA tenderness.  No tenderness on percussion of the back or rib cage. SKIN:  No rashes, ulcers or lesions. EXTREMITIES: No edema, no skin discoloration or tenderness.  No palpable cords. LYMPH NODES: No palpable cervical, supraclavicular, axillary or inguinal adenopathy  NEUROLOGICAL: Unremarkable. PSYCH:  Appropriate.  Filed Vitals:   09/20/15 0930  BP: 130/84  Pulse: 88  Temp: 95.8 F (35.4 C)  Resp: 18     Body mass index is 24.43 kg/(m^2).    ECOG FS:0 - Asymptomatic  LAB RESULTS:  Infusion on 09/20/2015  Component Date Value Ref Range Status  . WBC 09/20/2015 2.8* 3.6 - 11.0 K/uL Final  . RBC 09/20/2015 4.07  3.80 - 5.20 MIL/uL Final  . Hemoglobin 09/20/2015 12.2  12.0 - 16.0 g/dL Final  . HCT 09/20/2015 35.4  35.0 - 47.0 % Final  . MCV 09/20/2015 87.1  80.0 - 100.0 fL Final  . MCH 09/20/2015 29.9  26.0 - 34.0 pg Final  . MCHC 09/20/2015 34.4  32.0 - 36.0 g/dL Final  . RDW 09/20/2015 13.4  11.5 - 14.5 % Final  . Platelets 09/20/2015 495* 150 - 440 K/uL Final  . Neutrophils Relative % 09/20/2015 50   Final  . Neutro Abs 09/20/2015 1.4  1.4 - 6.5 K/uL Final  . Lymphocytes Relative 09/20/2015 27   Final  . Lymphs Abs 09/20/2015 0.8* 1.0 - 3.6 K/uL Final  . Monocytes Relative 09/20/2015 18   Final  . Monocytes Absolute 09/20/2015 0.5  0.2 - 0.9 K/uL Final  . Eosinophils Relative 09/20/2015 1   Final  . Eosinophils Absolute 09/20/2015 0.0  0 - 0.7 K/uL Final  . Basophils Relative 09/20/2015 4   Final  . Basophils Absolute 09/20/2015 0.1  0 - 0.1 K/uL  Final     STUDIES: Nm Cardiac Muga Rest  08/28/2015  CLINICAL DATA:  Breast cancer. Evaluate cardiac function in relation to chemotherapy. EXAM: NUCLEAR MEDICINE CARDIAC BLOOD POOL  IMAGING (MUGA) TECHNIQUE: Cardiac multi-gated acquisition was performed at rest following intravenous injection of Tc-10mlabeled red blood cells. RADIOPHARMACEUTICALS:  18.8 mCi Tc-976mDP in-vitro labeled red blood cells IV COMPARISON:  None. FINDINGS: No  focal wall motion abnormality of the left ventricle. Calculated left ventricular ejection fraction equals 55% IMPRESSION: Left ventricular ejection fraction equals 55 %. Electronically Signed   By: StSuzy Bouchard.D.   On: 08/28/2015 10:09    ASSESSMENT: cancer   of right breast stage IC disease.  Estrogen receptor progesterone receptor positive HER-2 receptor negative.  With Mammo print score is high risk with average risk of 22% at 5 years distant recurrent disease or 29% at that 10 years. I  Recommendation would include.  Chemotherapy with Cytoxan and Adriamycin every 3 weeks 4 followed by Taxol   X  12  followed by Radiation therapy to the right breast  followed by anti-hormonal therapy with aromatase inhibitor. Patient was explained all the side effects of chemotherapy.  And informed consent has been 2.  She  is here for second cycle of chemotherapy. Did not have any significant nausea vomiting has alopecia. Proceed with second cycle of chemotherapy. she has some aches and pains due to Neulasta and was instructed to start Claritin   PLAN:  No problem-specific assessment & plan notes found for this encounter.   Patient expressed understanding and was in agreement with this plan. She also understands that She can call clinic at any time with any questions, concerns, or complaints.    No matching staging information was found for the patient.  JaForest GleasonMD   09/20/2015 10:29 AM

## 2015-09-20 NOTE — Progress Notes (Signed)
Patient states after her last treatment she had a few blisters on her upper lip.

## 2015-09-27 ENCOUNTER — Inpatient Hospital Stay: Payer: Medicare Other

## 2015-09-27 DIAGNOSIS — C50011 Malignant neoplasm of nipple and areola, right female breast: Secondary | ICD-10-CM

## 2015-09-27 LAB — CBC WITH DIFFERENTIAL/PLATELET
BASOS PCT: 0 %
Basophils Absolute: 0 10*3/uL (ref 0–0.1)
EOS ABS: 0.1 10*3/uL (ref 0–0.7)
Eosinophils Relative: 2 %
HCT: 34 % — ABNORMAL LOW (ref 35.0–47.0)
HEMOGLOBIN: 11.6 g/dL — AB (ref 12.0–16.0)
LYMPHS ABS: 0.5 10*3/uL — AB (ref 1.0–3.6)
Lymphocytes Relative: 20 %
MCH: 29.7 pg (ref 26.0–34.0)
MCHC: 34.1 g/dL (ref 32.0–36.0)
MCV: 87.1 fL (ref 80.0–100.0)
MONO ABS: 0.2 10*3/uL (ref 0.2–0.9)
Monocytes Relative: 6 %
NEUTROS ABS: 1.8 10*3/uL (ref 1.4–6.5)
Neutrophils Relative %: 72 %
PLATELETS: 254 10*3/uL (ref 150–440)
RBC: 3.9 MIL/uL (ref 3.80–5.20)
RDW: 13.2 % (ref 11.5–14.5)
WBC: 2.6 10*3/uL — ABNORMAL LOW (ref 3.6–11.0)

## 2015-10-03 ENCOUNTER — Other Ambulatory Visit: Payer: Self-pay | Admitting: *Deleted

## 2015-10-03 DIAGNOSIS — C50211 Malignant neoplasm of upper-inner quadrant of right female breast: Secondary | ICD-10-CM

## 2015-10-04 ENCOUNTER — Inpatient Hospital Stay: Payer: Medicare Other | Attending: Oncology

## 2015-10-04 DIAGNOSIS — Z79899 Other long term (current) drug therapy: Secondary | ICD-10-CM | POA: Insufficient documentation

## 2015-10-04 DIAGNOSIS — C50011 Malignant neoplasm of nipple and areola, right female breast: Secondary | ICD-10-CM

## 2015-10-04 DIAGNOSIS — R110 Nausea: Secondary | ICD-10-CM | POA: Insufficient documentation

## 2015-10-04 DIAGNOSIS — Z5111 Encounter for antineoplastic chemotherapy: Secondary | ICD-10-CM | POA: Insufficient documentation

## 2015-10-04 DIAGNOSIS — E876 Hypokalemia: Secondary | ICD-10-CM | POA: Diagnosis not present

## 2015-10-04 DIAGNOSIS — Z7689 Persons encountering health services in other specified circumstances: Secondary | ICD-10-CM | POA: Insufficient documentation

## 2015-10-04 DIAGNOSIS — Z170 Estrogen receptor positive status [ER+]: Secondary | ICD-10-CM | POA: Insufficient documentation

## 2015-10-04 DIAGNOSIS — I10 Essential (primary) hypertension: Secondary | ICD-10-CM | POA: Insufficient documentation

## 2015-10-04 DIAGNOSIS — C50211 Malignant neoplasm of upper-inner quadrant of right female breast: Secondary | ICD-10-CM | POA: Insufficient documentation

## 2015-10-04 DIAGNOSIS — E785 Hyperlipidemia, unspecified: Secondary | ICD-10-CM | POA: Insufficient documentation

## 2015-10-04 LAB — CBC WITH DIFFERENTIAL/PLATELET
Basophils Absolute: 0.1 10*3/uL (ref 0–0.1)
Basophils Relative: 1 %
Eosinophils Absolute: 0.1 10*3/uL (ref 0–0.7)
Eosinophils Relative: 2 %
HEMATOCRIT: 33.9 % — AB (ref 35.0–47.0)
HEMOGLOBIN: 11.5 g/dL — AB (ref 12.0–16.0)
LYMPHS ABS: 0.9 10*3/uL — AB (ref 1.0–3.6)
Lymphocytes Relative: 14 %
MCH: 29.9 pg (ref 26.0–34.0)
MCHC: 33.9 g/dL (ref 32.0–36.0)
MCV: 88.2 fL (ref 80.0–100.0)
MONO ABS: 0.6 10*3/uL (ref 0.2–0.9)
MONOS PCT: 10 %
NEUTROS ABS: 4.7 10*3/uL (ref 1.4–6.5)
NEUTROS PCT: 73 %
Platelets: 184 10*3/uL (ref 150–440)
RBC: 3.84 MIL/uL (ref 3.80–5.20)
RDW: 14.1 % (ref 11.5–14.5)
WBC: 6.4 10*3/uL (ref 3.6–11.0)

## 2015-10-04 NOTE — Addendum Note (Signed)
Addended by: Verlene Mayer A on: 10/04/2015 12:37 PM   Modules accepted: Orders

## 2015-10-05 ENCOUNTER — Encounter: Payer: Self-pay | Admitting: General Surgery

## 2015-10-10 ENCOUNTER — Other Ambulatory Visit: Payer: Self-pay | Admitting: Family Medicine

## 2015-10-11 ENCOUNTER — Inpatient Hospital Stay (HOSPITAL_BASED_OUTPATIENT_CLINIC_OR_DEPARTMENT_OTHER): Payer: Medicare Other | Admitting: Oncology

## 2015-10-11 ENCOUNTER — Inpatient Hospital Stay: Payer: Medicare Other

## 2015-10-11 ENCOUNTER — Telehealth: Payer: Self-pay | Admitting: *Deleted

## 2015-10-11 ENCOUNTER — Encounter: Payer: Self-pay | Admitting: Oncology

## 2015-10-11 VITALS — BP 121/78 | HR 81 | Temp 96.6°F | Resp 18 | Wt 133.8 lb

## 2015-10-11 DIAGNOSIS — C50211 Malignant neoplasm of upper-inner quadrant of right female breast: Secondary | ICD-10-CM

## 2015-10-11 DIAGNOSIS — Z79899 Other long term (current) drug therapy: Secondary | ICD-10-CM

## 2015-10-11 DIAGNOSIS — E876 Hypokalemia: Secondary | ICD-10-CM | POA: Diagnosis not present

## 2015-10-11 DIAGNOSIS — Z170 Estrogen receptor positive status [ER+]: Secondary | ICD-10-CM

## 2015-10-11 DIAGNOSIS — E785 Hyperlipidemia, unspecified: Secondary | ICD-10-CM

## 2015-10-11 DIAGNOSIS — C50011 Malignant neoplasm of nipple and areola, right female breast: Secondary | ICD-10-CM

## 2015-10-11 DIAGNOSIS — I10 Essential (primary) hypertension: Secondary | ICD-10-CM

## 2015-10-11 LAB — CBC WITH DIFFERENTIAL/PLATELET
BASOS ABS: 0.1 10*3/uL (ref 0–0.1)
BASOS PCT: 3 %
EOS ABS: 0 10*3/uL (ref 0–0.7)
Eosinophils Relative: 1 %
HCT: 32.7 % — ABNORMAL LOW (ref 35.0–47.0)
HEMOGLOBIN: 11.4 g/dL — AB (ref 12.0–16.0)
Lymphocytes Relative: 23 %
Lymphs Abs: 0.6 10*3/uL — ABNORMAL LOW (ref 1.0–3.6)
MCH: 30.8 pg (ref 26.0–34.0)
MCHC: 34.9 g/dL (ref 32.0–36.0)
MCV: 88.4 fL (ref 80.0–100.0)
Monocytes Absolute: 0.4 10*3/uL (ref 0.2–0.9)
Monocytes Relative: 17 %
NEUTROS PCT: 56 %
Neutro Abs: 1.4 10*3/uL (ref 1.4–6.5)
Platelets: 395 10*3/uL (ref 150–440)
RBC: 3.7 MIL/uL — AB (ref 3.80–5.20)
RDW: 14.9 % — ABNORMAL HIGH (ref 11.5–14.5)
WBC: 2.5 10*3/uL — AB (ref 3.6–11.0)

## 2015-10-11 LAB — COMPREHENSIVE METABOLIC PANEL
ALBUMIN: 3.9 g/dL (ref 3.5–5.0)
ALK PHOS: 54 U/L (ref 38–126)
ALT: 27 U/L (ref 14–54)
ANION GAP: 6 (ref 5–15)
AST: 24 U/L (ref 15–41)
BUN: 18 mg/dL (ref 6–20)
CALCIUM: 8.7 mg/dL — AB (ref 8.9–10.3)
CO2: 27 mmol/L (ref 22–32)
Chloride: 104 mmol/L (ref 101–111)
Creatinine, Ser: 0.77 mg/dL (ref 0.44–1.00)
GFR calc Af Amer: >60 mL/min (ref >60)
GFR calc non Af Amer: >60 mL/min (ref >60)
GLUCOSE: 118 mg/dL — AB (ref 65–99)
Potassium: 2.7 mmol/L — CL (ref 3.5–5.1)
SODIUM: 137 mmol/L (ref 135–145)
Total Bilirubin: 0.7 mg/dL (ref 0.3–1.2)
Total Protein: 6.9 g/dL (ref 6.5–8.1)

## 2015-10-11 LAB — MAGNESIUM: MAGNESIUM: 1.7 mg/dL (ref 1.7–2.4)

## 2015-10-11 MED ORDER — SODIUM CHLORIDE 0.9 % IJ SOLN
10.0000 mL | INTRAMUSCULAR | Status: DC | PRN
  Filled 2015-10-11: qty 10

## 2015-10-11 MED ORDER — FOSAPREPITANT DIMEGLUMINE INJECTION 150 MG
Freq: Once | INTRAVENOUS | Status: AC
  Administered 2015-10-11: 12:00:00 via INTRAVENOUS
  Filled 2015-10-11: qty 5

## 2015-10-11 MED ORDER — HEPARIN SOD (PORK) LOCK FLUSH 100 UNIT/ML IV SOLN
500.0000 [IU] | Freq: Once | INTRAVENOUS | Status: AC | PRN
  Administered 2015-10-11: 500 [IU]
  Filled 2015-10-11: qty 5

## 2015-10-11 MED ORDER — SODIUM CHLORIDE 0.9 % IV SOLN
Freq: Once | INTRAVENOUS | Status: AC
  Administered 2015-10-11: 11:00:00 via INTRAVENOUS
  Filled 2015-10-11: qty 100

## 2015-10-11 MED ORDER — PEGFILGRASTIM 6 MG/0.6ML ~~LOC~~ PSKT
6.0000 mg | PREFILLED_SYRINGE | Freq: Once | SUBCUTANEOUS | Status: AC
  Administered 2015-10-11: 6 mg via SUBCUTANEOUS
  Filled 2015-10-11: qty 0.6

## 2015-10-11 MED ORDER — SODIUM CHLORIDE 0.9 % IV SOLN
600.0000 mg/m2 | Freq: Once | INTRAVENOUS | Status: AC
  Administered 2015-10-11: 980 mg via INTRAVENOUS
  Filled 2015-10-11: qty 49

## 2015-10-11 MED ORDER — DOXORUBICIN HCL CHEMO IV INJECTION 2 MG/ML
60.0000 mg/m2 | Freq: Once | INTRAVENOUS | Status: AC
  Administered 2015-10-11: 98 mg via INTRAVENOUS
  Filled 2015-10-11: qty 49

## 2015-10-11 MED ORDER — SODIUM CHLORIDE 0.9 % IV SOLN
Freq: Once | INTRAVENOUS | Status: AC
  Administered 2015-10-11: 11:00:00 via INTRAVENOUS
  Filled 2015-10-11: qty 1000

## 2015-10-11 MED ORDER — PALONOSETRON HCL INJECTION 0.25 MG/5ML
0.2500 mg | Freq: Once | INTRAVENOUS | Status: AC
  Administered 2015-10-11: 0.25 mg via INTRAVENOUS
  Filled 2015-10-11: qty 5

## 2015-10-11 NOTE — Telephone Encounter (Signed)
Critical Lab - K+ 2.7.  MD notified. 

## 2015-10-11 NOTE — Progress Notes (Signed)
[facility]cer [facility] @ [facility] [phone]  Fax:(336) [phone]   INITIAL CONSULT  Sandra [name] OB: [birth year]  MR#: [id number]  [facility]#:[id number]  Patient Care Team: Dianne Dun, MD as PCP - General (Family Medicine) Tora Duck, PA-C (Physician Assistant) Beverley Fiedler, MD as Consulting Physician (Gastroenterology) Dianne Dun, MD as Consulting Physician (Family Medicine) Seeplaputhur Wynona Luna, MD (General Surgery)  CHIEF COMPLAINT:  Chief Complaint  Patient presents with  . 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  Patient started Cytoxan and Adriamycin on now August 30, 2015 MUGA scan shows ejection fraction of baseline 55% VISIT DIAGNOSIS:   No diagnosis found.    No history exists.    Oncology Flowsheet 07/13/2015 08/30/2015 09/20/2015  Day, Cycle - Day 1, Cycle 1 Day 1, Cycle 2  cyclophosphamide (CYTOXAN) IV - 600 mg/m2 600 mg/m2  dexamethasone (DECADRON) IV - [ 12 mg ] [ 12 mg ]  DOXOrubicin (ADRIAMYCIN) IV - 60 mg/m2 60 mg/m2  fosaprepitant (EMEND) IV - [ 150 mg ] [ 150 mg ]  ondansetron (ZOFRAN) IV - - -  palonosetron (ALOXI) IV - 0.25 mg 0.25 mg  pegfilgrastim (NEULASTA ONPRO KIT) [place] - 6 mg 6 mg    INTERVAL HISTORY: 70 year old lady who has no family history of breast cancer.  Had a distant history of estrogen replacement therapy only for 1 year.  Had abnormal mammogram which was evaluated underwent biopsy which was positive for invasive mammary carcinoma patient underwent lumpectomy and sentinel lymph node biopsy.  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  Patient is here to discuss the result and further planning of treatment. A  Patient is here for ongoing evaluation and treatment consideration starting third cycle of treatment  Tolerated chemotherapy very well.  Head alopecia but no evidence of stomatitis No chills or fever  REVIEW OF SYSTEMS:   GENERAL:  Feels good.  Active.  No fevers, sweats or weight loss. PERFORMANCE STATUS (ECOG):0 HEENT:  No visual changes, runny nose, sore throat, mouth sores or tenderness. Lungs: No shortness of breath or cough.  No hemoptysis. Cardiac:  No chest pain, palpitations, orthopnea, or PND. GI:  No nausea, vomiting, diarrhea, constipation, melena or hematochezia. GU:  No urgency, frequency, dysuria, or hematuria. Musculoskeletal:  No back pain.  No joint pain.  No muscle tenderness. Extremities:  No pain or swelling. Skin:  No rashes or skin changes. Neuro:  No headache, numbness or weakness, balance or coordination issues. Endocrine:  No diabetes, thyroid issues, hot flashes or night sweats. Psych:  No mood changes, depression or anxiety. Pain:  No focal pain. Review of systems:  All other systems reviewed and found to be negative.  As per HPI. Otherwise, a complete review of systems is negatve.  PAST MEDICAL HISTORY: Past Medical History  Diagnosis Date  . Hypertension   . Hyperlipidemia   . Cancer (HCC)   . Visual blurriness     SOMETHING NEW THAT HAS DEVELOPED SINCE 07-13-15 SURGERY- SEEN AT [facility] AND EXAM WAS NORMAL PER PT (08-09-15)    PAST SURGICAL HISTORY: Past Surgical History  Procedure Laterality Date  . Abdominal hysterectomy  1987  . Fatty tumor Left 2013    side  . Bunionectomy Right 1993  . Breast biopsy Right     neg  . Breast biopsy Left     4 oclock, FIBROEPITHELIAL LESION FAVOR CELLULAR FIBROADENOMA  . Breast biopsy Left     2 oclock, CYSTIC APOCRINE METAPLASIA  . Breast biopsy Right 07/13/15    130 INVASIVE MAMMARY CARCINOMA, NO  SPECIAL TYPE  . Breast lumpectomy with sentinel lymph node biopsy Right 07/13/2015    Procedure: BREAST LUMPECTOMY WITH SENTINEL LYMPH NODE BX;  Surgeon: Christene Lye, MD;  Location: [facility] ORS;  Service: General;  Laterality: Right;  . Breast lumpectomy Left 07/13/2015    Procedure: BREAST LUMPECTOMY;  Surgeon: Christene Lye, MD;  Location: [facility] ORS;  Service: General;  Laterality: Left;  . Portacath placement Left 08/16/2015    Procedure: INSERTION PORT-A-CATH;  Surgeon: Christene Lye, MD;  Location: [facility] ORS;  Service: General;  Laterality: Left;    FAMILY HISTORY Family History  Problem Relation Age of Onset  . Cancer Sister 40    breast cancer  . Colon cancer Neg Hx         ADVANCED DIRECTIVES:   Patient does not have any living will or healthcare power of attorney.  Information was given .  Available resources had been discussed.  We will follow-up on subsequent appointments regarding this issue HEALTH MAINTENANCE: Social History  Substance Use Topics  . Smoking status: Never Smoker   . Smokeless tobacco: Never Used  . Alcohol Use: No     Colonoscopy:  PAP:  Bone density:  Lipid panel:  Allergies  Allergen Reactions  . Lovastatin Anaphylaxis    Tongue swelling    Current Outpatient Prescriptions  Medication Sig Dispense Refill  . amLODipine (NORVASC) 5 MG tablet TAKE ONE TABLET BY MOUTH ONCE DAILY 90 tablet 1  . cholecalciferol (VITAMIN D) 1000 units tablet Take 1,000 Units by mouth daily.    . hydrochlorothiazide (HYDRODIURIL) 25 MG tablet TAKE ONE TABLET BY MOUTH ONCE DAILY 90 tablet 0  . lidocaine-prilocaine (EMLA) cream Apply to port site 1-2 hours prior to port being accessed.  Cover with plastic wrap. 1 each 3  . ondansetron (ZOFRAN) 4 MG tablet Take 1 tablet (4 mg total) by mouth every 6 (six) hours as needed. 30 tablet 3  . Potassium Chloride ER 20 MEQ TBCR Take 1 tablet by mouth daily.    . pravastatin (PRAVACHOL) 10 MG tablet Take 1  tablet (10 mg total) by mouth daily. (Patient taking differently: Take 10 mg by mouth at bedtime. ) 90 tablet 3   No current facility-administered medications for this visit.    OBJECTIVE: PHYSICAL EXAM: GENERAL:  Well developed, well nourished, sitting comfortably in the exam room in no acute distress. MENTAL STATUS:  Alert and oriented to person, place and time.  ENT:  Oropharynx clear without lesion.  Tongue normal. Mucous membranes moist. Alopecia.  RESPIRATORY:  Clear to auscultation without rales, wheezes or rhonchi. Patient has a port placed on the left upper chest wall area.  No evidence of redness. CARDIOVASCULAR:  Regular rate and rhythm without murmur, rub or gallop. BREAST:  Right breast without masses, wound has healed well.  skin changes or nipple discharge.  Left breast without masses, skin changes or nipple discharge. ABDOMEN:  Soft, non-tender, with active bowel sounds, and no hepatosplenomegaly.  No masses. BACK:  No CVA tenderness.  No  tenderness on percussion of the back or rib cage. SKIN:  No rashes, ulcers or lesions. EXTREMITIES: No edema, no skin discoloration or tenderness.  No palpable cords. LYMPH NODES: No palpable cervical, supraclavicular, axillary or inguinal adenopathy  NEUROLOGICAL: Unremarkable. PSYCH:  Appropriate.  Filed Vitals:   10/11/15 1012  BP: 121/78  Pulse: 81  Temp: 96.6 F (35.9 C)  Resp: 18     Body mass index is 24.47 kg/(m^2).    ECOG FS:0 - Asymptomatic  LAB RESULTS:  Appointment on 10/11/2015  Component Date Value Ref Range Status  . WBC 10/11/2015 2.5* 3.6 - 11.0 K/uL Final  . RBC 10/11/2015 3.70* 3.80 - 5.20 MIL/uL Final  . Hemoglobin 10/11/2015 11.4* 12.0 - 16.0 g/dL Final  . HCT 10/11/2015 32.7* 35.0 - 47.0 % Final  . MCV 10/11/2015 88.4  80.0 - 100.0 fL Final  . MCH 10/11/2015 30.8  26.0 - 34.0 pg Final  . MCHC 10/11/2015 34.9  32.0 - 36.0 g/dL Final  . RDW 10/11/2015 14.9* 11.5 - 14.5 % Final  . Platelets 10/11/2015  395  150 - 440 K/uL Final  . Neutrophils Relative % 10/11/2015 56   Final  . Neutro Abs 10/11/2015 1.4  1.4 - 6.5 K/uL Final  . Lymphocytes Relative 10/11/2015 23   Final  . Lymphs Abs 10/11/2015 0.6* 1.0 - 3.6 K/uL Final  . Monocytes Relative 10/11/2015 17   Final  . Monocytes Absolute 10/11/2015 0.4  0.2 - 0.9 K/uL Final  . Eosinophils Relative 10/11/2015 1   Final  . Eosinophils Absolute 10/11/2015 0.0  0 - 0.7 K/uL Final  . Basophils Relative 10/11/2015 3   Final  . Basophils Absolute 10/11/2015 0.1  0 - 0.1 K/uL Final  . Sodium 10/11/2015 137  135 - 145 mmol/L Final  . Potassium 10/11/2015 2.7* 3.5 - 5.1 mmol/L Final   Comment: RESULTS VERIFIED BY REPEAT TESTING CRITICAL RESULT CALLED TO, READ BACK BY AND VERIFIED WITH NYO TO ANITA BLACK 10/11/15 0951   . Chloride 10/11/2015 104  101 - 111 mmol/L Final  . CO2 10/11/2015 27  22 - 32 mmol/L Final  . Glucose, Bld 10/11/2015 118* 65 - 99 mg/dL Final  . BUN 10/11/2015 18  6 - 20 mg/dL Final  . Creatinine, Ser 10/11/2015 0.77  0.44 - 1.00 mg/dL Final  . Calcium 10/11/2015 8.7* 8.9 - 10.3 mg/dL Final  . Total Protein 10/11/2015 6.9  6.5 - 8.1 g/dL Final  . Albumin 10/11/2015 3.9  3.5 - 5.0 g/dL Final  . AST 10/11/2015 24  15 - 41 U/L Final  . ALT 10/11/2015 27  14 - 54 U/L Final  . Alkaline Phosphatase 10/11/2015 54  38 - 126 U/L Final  . Total Bilirubin 10/11/2015 0.7  0.3 - 1.2 mg/dL Final  . GFR calc non Af Amer 10/11/2015 >60  >60 mL/min Final  . GFR calc Af Amer 10/11/2015 >60  >60 mL/min Final   Comment: (NOTE) The eGFR has been calculated using the CKD EPI equation. This calculation has not been validated in all clinical situations. eGFR's persistently <60 mL/min signify possible Chronic Kidney Disease.   . Anion gap 10/11/2015 6  5 - 15 Final  . Magnesium 10/11/2015 1.7  1.7 - 2.4 mg/dL Final     STUDIES: No results found.  ASSESSMENT: cancer   of right breast stage IC disease.  Estrogen receptor progesterone receptor  positive HER-2 receptor negative.  With Mammo print score is high risk with average risk of  22% at 5 years distant recurrent disease or 29% at that 10 years. I Hypokalemia and hypomagnesemia We will give IV potassium and IV magnesium 3.  Continue chemotherapy third cycle of Cytoxan Adriamycin Neutrophil count is slightly low but patient will receive Neulasta  PLAN:  No problem-specific assessment & plan notes found for this encounter.   Patient expressed understanding and was in agreement with this plan. She also understands that She can call clinic at any time with any questions, concerns, or complaints.    No matching staging information was found for the patient.  Forest Gleason, MD   10/11/2015 10:29 AM

## 2015-10-16 ENCOUNTER — Telehealth: Payer: Self-pay | Admitting: *Deleted

## 2015-10-16 MED ORDER — HYDROCODONE-ACETAMINOPHEN 5-325 MG PO TABS: 1.0000 | ORAL_TABLET | Freq: Four times a day (QID) | ORAL | Status: DC | PRN

## 2015-10-16 NOTE — Telephone Encounter (Signed)
Advised can take tylenol and that we can give her #10 tabs of Norco. She stated she will pick up rx in morning

## 2015-10-16 NOTE — Telephone Encounter (Signed)
Called to report she is having low back pain and wants to know what she can take for it

## 2015-10-17 ENCOUNTER — Encounter: Payer: Self-pay | Admitting: *Deleted

## 2015-10-18 ENCOUNTER — Inpatient Hospital Stay: Payer: Medicare Other

## 2015-10-18 DIAGNOSIS — C50211 Malignant neoplasm of upper-inner quadrant of right female breast: Secondary | ICD-10-CM | POA: Diagnosis not present

## 2015-10-18 DIAGNOSIS — C50011 Malignant neoplasm of nipple and areola, right female breast: Secondary | ICD-10-CM

## 2015-10-18 LAB — CBC WITH DIFFERENTIAL/PLATELET
BASOS PCT: 0 %
Basophils Absolute: 0 10*3/uL (ref 0–0.1)
EOS PCT: 2 %
Eosinophils Absolute: 0 10*3/uL (ref 0–0.7)
HEMATOCRIT: 29.9 % — AB (ref 35.0–47.0)
HEMOGLOBIN: 10.4 g/dL — AB (ref 12.0–16.0)
LYMPHS ABS: 0.3 10*3/uL — AB (ref 1.0–3.6)
Lymphocytes Relative: 20 %
MCH: 30.6 pg (ref 26.0–34.0)
MCHC: 34.7 g/dL (ref 32.0–36.0)
MCV: 88 fL (ref 80.0–100.0)
MONO ABS: 0.1 10*3/uL — AB (ref 0.2–0.9)
Monocytes Relative: 4 %
Neutro Abs: 1.3 10*3/uL — ABNORMAL LOW (ref 1.4–6.5)
Neutrophils Relative %: 74 %
Platelets: 175 10*3/uL (ref 150–440)
RBC: 3.39 MIL/uL — ABNORMAL LOW (ref 3.80–5.20)
RDW: 14.8 % — ABNORMAL HIGH (ref 11.5–14.5)
WBC: 1.7 10*3/uL — AB (ref 3.6–11.0)

## 2015-10-25 ENCOUNTER — Inpatient Hospital Stay: Payer: Medicare Other

## 2015-10-25 DIAGNOSIS — C50211 Malignant neoplasm of upper-inner quadrant of right female breast: Secondary | ICD-10-CM | POA: Diagnosis not present

## 2015-10-25 DIAGNOSIS — C50011 Malignant neoplasm of nipple and areola, right female breast: Secondary | ICD-10-CM

## 2015-10-25 LAB — CBC WITH DIFFERENTIAL/PLATELET
BASOS ABS: 0.1 10*3/uL (ref 0–0.1)
Basophils Relative: 2 %
EOS ABS: 0.2 10*3/uL (ref 0–0.7)
EOS PCT: 3 %
HCT: 32 % — ABNORMAL LOW (ref 35.0–47.0)
HEMOGLOBIN: 11.1 g/dL — AB (ref 12.0–16.0)
LYMPHS PCT: 13 %
Lymphs Abs: 0.9 10*3/uL — ABNORMAL LOW (ref 1.0–3.6)
MCH: 31 pg (ref 26.0–34.0)
MCHC: 34.6 g/dL (ref 32.0–36.0)
MCV: 89.7 fL (ref 80.0–100.0)
Monocytes Absolute: 0.6 10*3/uL (ref 0.2–0.9)
Monocytes Relative: 10 %
NEUTROS PCT: 72 %
Neutro Abs: 4.8 10*3/uL (ref 1.4–6.5)
PLATELETS: 206 10*3/uL (ref 150–440)
RBC: 3.57 MIL/uL — AB (ref 3.80–5.20)
RDW: 16 % — ABNORMAL HIGH (ref 11.5–14.5)
WBC: 6.6 10*3/uL (ref 3.6–11.0)

## 2015-10-27 ENCOUNTER — Other Ambulatory Visit: Payer: Self-pay | Admitting: Family Medicine

## 2015-10-30 ENCOUNTER — Inpatient Hospital Stay (HOSPITAL_BASED_OUTPATIENT_CLINIC_OR_DEPARTMENT_OTHER): Payer: Medicare Other | Admitting: Oncology

## 2015-10-30 ENCOUNTER — Inpatient Hospital Stay: Payer: Medicare Other

## 2015-10-30 ENCOUNTER — Encounter: Payer: Self-pay | Admitting: Oncology

## 2015-10-30 VITALS — BP 117/74 | HR 85 | Temp 97.2°F | Resp 18 | Wt 134.2 lb

## 2015-10-30 DIAGNOSIS — C50211 Malignant neoplasm of upper-inner quadrant of right female breast: Secondary | ICD-10-CM

## 2015-10-30 DIAGNOSIS — R110 Nausea: Secondary | ICD-10-CM

## 2015-10-30 DIAGNOSIS — Z170 Estrogen receptor positive status [ER+]: Secondary | ICD-10-CM | POA: Diagnosis not present

## 2015-10-30 DIAGNOSIS — E876 Hypokalemia: Secondary | ICD-10-CM

## 2015-10-30 DIAGNOSIS — C50011 Malignant neoplasm of nipple and areola, right female breast: Secondary | ICD-10-CM

## 2015-10-30 DIAGNOSIS — E785 Hyperlipidemia, unspecified: Secondary | ICD-10-CM

## 2015-10-30 DIAGNOSIS — I10 Essential (primary) hypertension: Secondary | ICD-10-CM

## 2015-10-30 DIAGNOSIS — Z79899 Other long term (current) drug therapy: Secondary | ICD-10-CM

## 2015-10-30 LAB — COMPREHENSIVE METABOLIC PANEL
ALT: 22 U/L (ref 14–54)
AST: 26 U/L (ref 15–41)
Albumin: 4.1 g/dL (ref 3.5–5.0)
Alkaline Phosphatase: 55 U/L (ref 38–126)
Anion gap: 7 (ref 5–15)
BILIRUBIN TOTAL: 0.5 mg/dL (ref 0.3–1.2)
BUN: 11 mg/dL (ref 6–20)
CHLORIDE: 101 mmol/L (ref 101–111)
CO2: 27 mmol/L (ref 22–32)
CREATININE: 0.83 mg/dL (ref 0.44–1.00)
Calcium: 8.5 mg/dL — ABNORMAL LOW (ref 8.9–10.3)
Glucose, Bld: 102 mg/dL — ABNORMAL HIGH (ref 65–99)
POTASSIUM: 3.2 mmol/L — AB (ref 3.5–5.1)
Sodium: 135 mmol/L (ref 135–145)
TOTAL PROTEIN: 6.7 g/dL (ref 6.5–8.1)

## 2015-10-30 LAB — MAGNESIUM: MAGNESIUM: 1.6 mg/dL — AB (ref 1.7–2.4)

## 2015-10-30 LAB — CBC WITH DIFFERENTIAL/PLATELET
Basophils Absolute: 0.1 10*3/uL (ref 0–0.1)
Basophils Relative: 3 %
EOS PCT: 2 %
Eosinophils Absolute: 0.1 10*3/uL (ref 0–0.7)
HCT: 32.9 % — ABNORMAL LOW (ref 35.0–47.0)
Hemoglobin: 11.3 g/dL — ABNORMAL LOW (ref 12.0–16.0)
LYMPHS ABS: 0.5 10*3/uL — AB (ref 1.0–3.6)
LYMPHS PCT: 17 %
MCH: 31.1 pg (ref 26.0–34.0)
MCHC: 34.5 g/dL (ref 32.0–36.0)
MCV: 90 fL (ref 80.0–100.0)
MONO ABS: 0.5 10*3/uL (ref 0.2–0.9)
MONOS PCT: 15 %
Neutro Abs: 1.9 10*3/uL (ref 1.4–6.5)
Neutrophils Relative %: 63 %
PLATELETS: 350 10*3/uL (ref 150–440)
RBC: 3.65 MIL/uL — ABNORMAL LOW (ref 3.80–5.20)
RDW: 16.9 % — AB (ref 11.5–14.5)
WBC: 3.1 10*3/uL — ABNORMAL LOW (ref 3.6–11.0)

## 2015-10-30 NOTE — Progress Notes (Signed)
[place] @ [facility] Telephone:(336) [phone]  Fax:(336) [place] OB: August 04, 1946  MR#: [id number]  WIO#:[id number]  Patient Care Team: Lucille Passy, MD as PCP - General (Family Medicine) Glennie [place], PA-C (Physician Assistant) Jerene Bears, MD as Consulting Physician (Gastroenterology) Lucille Passy, MD as Consulting Physician (Family Medicine) Seeplaputhur Robinette Haines, MD (General Surgery)  CHIEF COMPLAINT:  Chief Complaint  Patient presents with  . 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  Patient started Cytoxan and Adriamycin on now August 30, 2015 MUGA scan shows ejection fraction of baseline 55% VISIT DIAGNOSIS:   No diagnosis found.    No history exists.    Oncology Flowsheet 07/13/2015 08/30/2015 09/20/2015 10/11/2015  Day, Cycle - Day 1, Cycle 1 Day 1, Cycle 2 Day 1, Cycle 3  cyclophosphamide (CYTOXAN) IV - 600 mg/m2 600 mg/m2 600 mg/m2  dexamethasone (DECADRON) IV - [ 12 mg ] [ 12 mg ] [ 12 mg ]  DOXOrubicin (ADRIAMYCIN) IV - 60 mg/m2 60 mg/m2 60 mg/m2  fosaprepitant (EMEND) IV - [ 150 mg ] [ 150 mg ] [ 150 mg ]  ondansetron (ZOFRAN) IV - - - -  palonosetron (ALOXI) IV - 0.25 mg 0.25 mg 0.25 mg  pegfilgrastim (NEULASTA ONPRO KIT) [place] - 6 mg 6 mg 6 mg    INTERVAL HISTORY: 70 year old lady who has no family history of breast cancer.  Had a distant history of estrogen replacement therapy only for 1 year.  Had abnormal mammogram which was evaluated underwent biopsy which was positive for invasive mammary carcinoma patient underwent lumpectomy and sentinel lymph node biopsy.  Stage IC disease with  estrogen receptor positive progesterone receptor positive and I risk Mammo print has been observed.  Patient is here to discuss port cycle of chemotherapy.  Tolerated 3 cycles of treatment very well.  Has an outpatient.  Nausea no vomiting.  Fatigue.  No tingling numbness.  Here for further follow-up and treatment consideration .  REVIEW OF SYSTEMS:   GENERAL:  Feels good.  Active.  No fevers, sweats or weight loss. PERFORMANCE STATUS (ECOG):0 HEENT:  No visual changes, runny nose, sore throat, mouth sores or tenderness. Lungs: No shortness of breath or cough.  No hemoptysis. Cardiac:  No chest pain, palpitations, orthopnea, or PND. GI:  No nausea, vomiting, diarrhea, constipation, melena or hematochezia. GU:  No urgency, frequency, dysuria, or hematuria. Musculoskeletal:  No back pain.  No joint pain.  No muscle tenderness. Extremities:  No pain or swelling. Skin:  No rashes or skin changes. Neuro:  No headache, numbness or weakness, balance or coordination issues. Endocrine:  No diabetes, thyroid issues, hot flashes or night sweats. Psych:  No mood changes, depression or anxiety. Pain:  No focal pain. Review of systems:  All other systems reviewed and found to be negative.  As per HPI. Otherwise, a complete review of systems is negatve.  PAST MEDICAL HISTORY: Past Medical History  Diagnosis Date  . Hypertension   . Hyperlipidemia   . Cancer ([place])   . Visual blurriness     SOMETHING NEW THAT HAS DEVELOPED SINCE 07-13-15 SURGERY- SEEN AT [facility] ENT  AND EXAM WAS NORMAL PER PT (08-09-15)    PAST SURGICAL HISTORY: Past Surgical History  Procedure Laterality Date  . Abdominal hysterectomy  1987  . Fatty tumor Left 2013    side  . Bunionectomy Right 1993  . Breast biopsy Right     neg  . Breast biopsy Left     4 oclock, FIBROEPITHELIAL LESION FAVOR CELLULAR FIBROADENOMA  . Breast biopsy Left     2 oclock, CYSTIC APOCRINE METAPLASIA  . Breast biopsy Right 07/13/15    130 INVASIVE  MAMMARY CARCINOMA, NO SPECIAL TYPE  . Breast lumpectomy with sentinel lymph node biopsy Right 07/13/2015    Procedure: BREAST LUMPECTOMY WITH SENTINEL LYMPH NODE BX;  Surgeon: Christene Lye, MD;  Location: [facility] ORS;  Service: General;  Laterality: Right;  . Breast lumpectomy Left 07/13/2015    Procedure: BREAST LUMPECTOMY;  Surgeon: Christene Lye, MD;  Location: [facility] ORS;  Service: General;  Laterality: Left;  . Portacath placement Left 08/16/2015    Procedure: INSERTION PORT-A-CATH;  Surgeon: Christene Lye, MD;  Location: [facility] ORS;  Service: General;  Laterality: Left;    FAMILY HISTORY Family History  Problem Relation Age of Onset  . Cancer Sister 34    breast cancer  . Colon cancer Neg Hx         ADVANCED DIRECTIVES:   Patient does not have any living will or healthcare power of attorney.  Information was given .  Available resources had been discussed.  We will follow-up on subsequent appointments regarding this issue HEALTH MAINTENANCE: Social History  Substance Use Topics  . Smoking status: Never Smoker   . Smokeless tobacco: Never Used  . Alcohol Use: No     Colonoscopy:  PAP:  Bone density:  Lipid panel:  Allergies  Allergen Reactions  . Lovastatin Anaphylaxis    Tongue swelling    Current Outpatient Prescriptions  Medication Sig Dispense Refill  . amLODipine (NORVASC) 5 MG tablet TAKE ONE TABLET BY MOUTH ONCE DAILY 90 tablet 1  . cholecalciferol (VITAMIN D) 1000 units tablet Take 1,000 Units by mouth daily.    . hydrochlorothiazide (HYDRODIURIL) 25 MG tablet TAKE ONE TABLET BY MOUTH ONCE DAILY 90 tablet 0  . HYDROcodone-acetaminophen (NORCO/VICODIN) 5-325 MG tablet Take 1 tablet by mouth every 6 (six) hours as needed for moderate pain. 10 tablet 0  . lidocaine-prilocaine (EMLA) cream Apply to port site 1-2 hours prior to port being accessed.  Cover with plastic wrap. 1 each 3  . ondansetron (ZOFRAN) 4 MG tablet Take 1 tablet (4 mg total)  by mouth every 6 (six) hours as needed. 30 tablet 3  . Potassium Chloride ER 20 MEQ TBCR Take 1 tablet by mouth daily.    . pravastatin (PRAVACHOL) 10 MG tablet TAKE ONE TABLET BY MOUTH ONCE DAILY 90 tablet 1   No current facility-administered medications for this visit.    OBJECTIVE: PHYSICAL EXAM: GENERAL:  Well developed, well nourished, sitting comfortably in the exam room in no acute distress. MENTAL STATUS:  Alert and oriented to person, place and time.  ENT:  Oropharynx clear without lesion.  Tongue normal. Mucous membranes moist. Alopecia.  RESPIRATORY:  Clear to auscultation without rales, wheezes or rhonchi. Patient has a port placed on the left upper chest wall area.  No evidence of redness. CARDIOVASCULAR:  Regular rate and rhythm without murmur, rub or gallop. BREAST:  Right breast without masses, wound has healed well.  skin changes or nipple discharge.  Left breast  without masses, skin changes or nipple discharge. ABDOMEN:  Soft, non-tender, with active bowel sounds, and no hepatosplenomegaly.  No masses. BACK:  No CVA tenderness.  No tenderness on percussion of the back or rib cage. SKIN:  No rashes, ulcers or lesions. EXTREMITIES: No edema, no skin discoloration or tenderness.  No palpable cords. LYMPH NODES: No palpable cervical, supraclavicular, axillary or inguinal adenopathy  NEUROLOGICAL: Unremarkable. PSYCH:  Appropriate.  Filed Vitals:   10/30/15 1011  BP: 117/74  Pulse: 85  Temp: 97.2 F (36.2 C)  Resp: 18     Body mass index is 24.55 kg/(m^2).    ECOG FS:0 - Asymptomatic  LAB RESULTS:  Appointment on 10/30/2015  Component Date Value Ref Range Status  . WBC 10/30/2015 3.1* 3.6 - 11.0 K/uL Final  . RBC 10/30/2015 3.65* 3.80 - 5.20 MIL/uL Final  . Hemoglobin 10/30/2015 11.3* 12.0 - 16.0 g/dL Final  . HCT 10/30/2015 32.9* 35.0 - 47.0 % Final  . MCV 10/30/2015 90.0  80.0 - 100.0 fL Final  . MCH 10/30/2015 31.1  26.0 - 34.0 pg Final  . MCHC 10/30/2015  34.5  32.0 - 36.0 g/dL Final  . RDW 10/30/2015 16.9* 11.5 - 14.5 % Final  . Platelets 10/30/2015 350  150 - 440 K/uL Final  . Neutrophils Relative % 10/30/2015 63   Final  . Neutro Abs 10/30/2015 1.9  1.4 - 6.5 K/uL Final  . Lymphocytes Relative 10/30/2015 17   Final  . Lymphs Abs 10/30/2015 0.5* 1.0 - 3.6 K/uL Final  . Monocytes Relative 10/30/2015 15   Final  . Monocytes Absolute 10/30/2015 0.5  0.2 - 0.9 K/uL Final  . Eosinophils Relative 10/30/2015 2   Final  . Eosinophils Absolute 10/30/2015 0.1  0 - 0.7 K/uL Final  . Basophils Relative 10/30/2015 3   Final  . Basophils Absolute 10/30/2015 0.1  0 - 0.1 K/uL Final  . Sodium 10/30/2015 135  135 - 145 mmol/L Final  . Potassium 10/30/2015 3.2* 3.5 - 5.1 mmol/L Final  . Chloride 10/30/2015 101  101 - 111 mmol/L Final  . CO2 10/30/2015 27  22 - 32 mmol/L Final  . Glucose, Bld 10/30/2015 102* 65 - 99 mg/dL Final  . BUN 10/30/2015 11  6 - 20 mg/dL Final  . Creatinine, Ser 10/30/2015 0.83  0.44 - 1.00 mg/dL Final  . Calcium 10/30/2015 8.5* 8.9 - 10.3 mg/dL Final  . Total Protein 10/30/2015 6.7  6.5 - 8.1 g/dL Final  . Albumin 10/30/2015 4.1  3.5 - 5.0 g/dL Final  . AST 10/30/2015 26  15 - 41 U/L Final  . ALT 10/30/2015 22  14 - 54 U/L Final  . Alkaline Phosphatase 10/30/2015 55  38 - 126 U/L Final  . Total Bilirubin 10/30/2015 0.5  0.3 - 1.2 mg/dL Final  . GFR calc non Af Amer 10/30/2015 >60  >60 mL/min Final  . GFR calc Af Amer 10/30/2015 >60  >60 mL/min Final   Comment: (NOTE) The eGFR has been calculated using the CKD EPI equation. This calculation has not been validated in all clinical situations. eGFR's persistently <60 mL/min signify possible Chronic Kidney Disease.   . Anion gap 10/30/2015 7  5 - 15 Final  . Magnesium 10/30/2015 1.6* 1.7 - 2.4 mg/dL Final     STUDIES: No results found.  ASSESSMENT: cancer   of right breast stage IC disease.  Estrogen receptor progesterone receptor positive HER-2 receptor negative.  With  Mammo print score is high risk with average  risk of 22% at 5 years distant recurrent disease or 29% at that 10 years. I Hypokalemia and hypomagnesemia We will give IV potassium and IV magnesium 3.  Continue chemotherapy 4 TH cycle of Cytoxan Adriamycin Hypokalemia and hypomagnesemia We will proceed with IV potassium and IV magnesium supplements PATIENT  will be reevaluated in 3 weeks to initiate Taxol chemotherapy on a weekly basis  Patient expressed understanding and was in agreement with this plan. She also understands that She can call clinic at any time with any questions, concerns, or complaints.    No matching staging information was found for the patient.  Forest Gleason, MD   10/30/2015 11:08 AM

## 2015-11-01 ENCOUNTER — Inpatient Hospital Stay: Payer: Medicare Other

## 2015-11-01 DIAGNOSIS — C50211 Malignant neoplasm of upper-inner quadrant of right female breast: Secondary | ICD-10-CM

## 2015-11-01 MED ORDER — SODIUM CHLORIDE 0.9 % IV SOLN
Freq: Once | INTRAVENOUS | Status: AC
  Administered 2015-11-01: 10:00:00 via INTRAVENOUS
  Filled 2015-11-01: qty 5

## 2015-11-01 MED ORDER — HEPARIN SOD (PORK) LOCK FLUSH 100 UNIT/ML IV SOLN
500.0000 [IU] | Freq: Once | INTRAVENOUS | Status: AC | PRN
  Administered 2015-11-01: 500 [IU]
  Filled 2015-11-01: qty 5

## 2015-11-01 MED ORDER — DOXORUBICIN HCL CHEMO IV INJECTION 2 MG/ML
60.0000 mg/m2 | Freq: Once | INTRAVENOUS | Status: AC
  Administered 2015-11-01: 98 mg via INTRAVENOUS
  Filled 2015-11-01: qty 49

## 2015-11-01 MED ORDER — SODIUM CHLORIDE 0.9 % IV SOLN
Freq: Once | INTRAVENOUS | Status: AC
  Administered 2015-11-01: 10:00:00 via INTRAVENOUS
  Filled 2015-11-01: qty 1000

## 2015-11-01 MED ORDER — CYCLOPHOSPHAMIDE CHEMO INJECTION 1 GM
600.0000 mg/m2 | Freq: Once | INTRAMUSCULAR | Status: AC
  Administered 2015-11-01: 980 mg via INTRAVENOUS
  Filled 2015-11-01: qty 49

## 2015-11-01 MED ORDER — PALONOSETRON HCL INJECTION 0.25 MG/5ML
0.2500 mg | Freq: Once | INTRAVENOUS | Status: AC
  Administered 2015-11-01: 0.25 mg via INTRAVENOUS
  Filled 2015-11-01: qty 5

## 2015-11-01 MED ORDER — PEGFILGRASTIM 6 MG/0.6ML ~~LOC~~ PSKT
6.0000 mg | PREFILLED_SYRINGE | Freq: Once | SUBCUTANEOUS | Status: AC
  Administered 2015-11-01: 6 mg via SUBCUTANEOUS
  Filled 2015-11-01: qty 0.6

## 2015-11-08 ENCOUNTER — Inpatient Hospital Stay: Payer: Medicare Other | Attending: Oncology

## 2015-11-08 DIAGNOSIS — E785 Hyperlipidemia, unspecified: Secondary | ICD-10-CM | POA: Insufficient documentation

## 2015-11-08 DIAGNOSIS — Z79899 Other long term (current) drug therapy: Secondary | ICD-10-CM | POA: Diagnosis not present

## 2015-11-08 DIAGNOSIS — Z5111 Encounter for antineoplastic chemotherapy: Secondary | ICD-10-CM | POA: Insufficient documentation

## 2015-11-08 DIAGNOSIS — I10 Essential (primary) hypertension: Secondary | ICD-10-CM | POA: Diagnosis not present

## 2015-11-08 DIAGNOSIS — C50211 Malignant neoplasm of upper-inner quadrant of right female breast: Secondary | ICD-10-CM | POA: Diagnosis not present

## 2015-11-08 DIAGNOSIS — Z170 Estrogen receptor positive status [ER+]: Secondary | ICD-10-CM | POA: Insufficient documentation

## 2015-11-08 DIAGNOSIS — Z803 Family history of malignant neoplasm of breast: Secondary | ICD-10-CM | POA: Diagnosis not present

## 2015-11-08 DIAGNOSIS — R5383 Other fatigue: Secondary | ICD-10-CM | POA: Insufficient documentation

## 2015-11-08 DIAGNOSIS — R110 Nausea: Secondary | ICD-10-CM | POA: Diagnosis not present

## 2015-11-08 DIAGNOSIS — C50011 Malignant neoplasm of nipple and areola, right female breast: Secondary | ICD-10-CM

## 2015-11-08 LAB — CBC WITH DIFFERENTIAL/PLATELET
BASOS PCT: 1 %
Basophils Absolute: 0 10*3/uL (ref 0–0.1)
EOS ABS: 0 10*3/uL (ref 0–0.7)
EOS PCT: 2 %
HCT: 30.5 % — ABNORMAL LOW (ref 35.0–47.0)
HEMOGLOBIN: 10.7 g/dL — AB (ref 12.0–16.0)
Lymphocytes Relative: 15 %
Lymphs Abs: 0.3 10*3/uL — ABNORMAL LOW (ref 1.0–3.6)
MCH: 31.6 pg (ref 26.0–34.0)
MCHC: 35 g/dL (ref 32.0–36.0)
MCV: 90.2 fL (ref 80.0–100.0)
MONO ABS: 0.1 10*3/uL — AB (ref 0.2–0.9)
Monocytes Relative: 4 %
NEUTROS ABS: 1.6 10*3/uL (ref 1.4–6.5)
NEUTROS PCT: 78 %
PLATELETS: 142 10*3/uL — AB (ref 150–440)
RBC: 3.38 MIL/uL — ABNORMAL LOW (ref 3.80–5.20)
RDW: 15.3 % — ABNORMAL HIGH (ref 11.5–14.5)
WBC: 2 10*3/uL — ABNORMAL LOW (ref 3.6–11.0)

## 2015-11-15 ENCOUNTER — Inpatient Hospital Stay: Payer: Medicare Other

## 2015-11-15 DIAGNOSIS — C50011 Malignant neoplasm of nipple and areola, right female breast: Secondary | ICD-10-CM

## 2015-11-15 DIAGNOSIS — C50211 Malignant neoplasm of upper-inner quadrant of right female breast: Secondary | ICD-10-CM | POA: Diagnosis not present

## 2015-11-15 LAB — CBC WITH DIFFERENTIAL/PLATELET
BASOS ABS: 0.1 10*3/uL (ref 0–0.1)
Basophils Relative: 3 %
EOS PCT: 3 %
Eosinophils Absolute: 0.2 10*3/uL (ref 0–0.7)
HCT: 30.2 % — ABNORMAL LOW (ref 35.0–47.0)
HEMOGLOBIN: 10.4 g/dL — AB (ref 12.0–16.0)
LYMPHS PCT: 12 %
Lymphs Abs: 0.6 10*3/uL — ABNORMAL LOW (ref 1.0–3.6)
MCH: 31.6 pg (ref 26.0–34.0)
MCHC: 34.6 g/dL (ref 32.0–36.0)
MCV: 91.3 fL (ref 80.0–100.0)
Monocytes Absolute: 0.7 10*3/uL (ref 0.2–0.9)
Monocytes Relative: 14 %
NEUTROS ABS: 3.4 10*3/uL (ref 1.4–6.5)
NEUTROS PCT: 68 %
PLATELETS: 220 10*3/uL (ref 150–440)
RBC: 3.3 MIL/uL — ABNORMAL LOW (ref 3.80–5.20)
RDW: 15.8 % — ABNORMAL HIGH (ref 11.5–14.5)
WBC: 5 10*3/uL (ref 3.6–11.0)

## 2015-11-22 ENCOUNTER — Encounter: Payer: Self-pay | Admitting: Oncology

## 2015-11-22 ENCOUNTER — Inpatient Hospital Stay: Payer: Medicare Other

## 2015-11-22 ENCOUNTER — Inpatient Hospital Stay (HOSPITAL_BASED_OUTPATIENT_CLINIC_OR_DEPARTMENT_OTHER): Payer: Medicare Other | Admitting: Oncology

## 2015-11-22 VITALS — BP 122/82 | HR 80 | Temp 96.5°F | Resp 18 | Wt 132.9 lb

## 2015-11-22 DIAGNOSIS — R5383 Other fatigue: Secondary | ICD-10-CM

## 2015-11-22 DIAGNOSIS — I10 Essential (primary) hypertension: Secondary | ICD-10-CM

## 2015-11-22 DIAGNOSIS — C50211 Malignant neoplasm of upper-inner quadrant of right female breast: Secondary | ICD-10-CM

## 2015-11-22 DIAGNOSIS — R110 Nausea: Secondary | ICD-10-CM

## 2015-11-22 DIAGNOSIS — Z170 Estrogen receptor positive status [ER+]: Secondary | ICD-10-CM

## 2015-11-22 DIAGNOSIS — Z79899 Other long term (current) drug therapy: Secondary | ICD-10-CM

## 2015-11-22 DIAGNOSIS — C50011 Malignant neoplasm of nipple and areola, right female breast: Secondary | ICD-10-CM

## 2015-11-22 DIAGNOSIS — Z803 Family history of malignant neoplasm of breast: Secondary | ICD-10-CM

## 2015-11-22 DIAGNOSIS — E785 Hyperlipidemia, unspecified: Secondary | ICD-10-CM

## 2015-11-22 LAB — CBC WITH DIFFERENTIAL/PLATELET
BASOS PCT: 4 %
Basophils Absolute: 0.1 10*3/uL (ref 0–0.1)
EOS ABS: 0 10*3/uL (ref 0–0.7)
EOS PCT: 2 %
HEMATOCRIT: 31.3 % — AB (ref 35.0–47.0)
Hemoglobin: 10.9 g/dL — ABNORMAL LOW (ref 12.0–16.0)
Lymphocytes Relative: 16 %
Lymphs Abs: 0.4 10*3/uL — ABNORMAL LOW (ref 1.0–3.6)
MCH: 31.6 pg (ref 26.0–34.0)
MCHC: 34.8 g/dL (ref 32.0–36.0)
MCV: 91 fL (ref 80.0–100.0)
MONO ABS: 0.6 10*3/uL (ref 0.2–0.9)
MONOS PCT: 21 %
Neutro Abs: 1.6 10*3/uL (ref 1.4–6.5)
Neutrophils Relative %: 57 %
Platelets: 448 10*3/uL — ABNORMAL HIGH (ref 150–440)
RBC: 3.44 MIL/uL — ABNORMAL LOW (ref 3.80–5.20)
RDW: 15.9 % — AB (ref 11.5–14.5)
WBC: 2.8 10*3/uL — ABNORMAL LOW (ref 3.6–11.0)

## 2015-11-22 LAB — COMPREHENSIVE METABOLIC PANEL
ALBUMIN: 4.1 g/dL (ref 3.5–5.0)
ALT: 28 U/L (ref 14–54)
ANION GAP: 6 (ref 5–15)
AST: 33 U/L (ref 15–41)
Alkaline Phosphatase: 52 U/L (ref 38–126)
BILIRUBIN TOTAL: 0.5 mg/dL (ref 0.3–1.2)
BUN: 10 mg/dL (ref 6–20)
CO2: 25 mmol/L (ref 22–32)
Calcium: 8.8 mg/dL — ABNORMAL LOW (ref 8.9–10.3)
Chloride: 106 mmol/L (ref 101–111)
Creatinine, Ser: 0.79 mg/dL (ref 0.44–1.00)
GFR calc Af Amer: >60 mL/min (ref >60)
GFR calc non Af Amer: >60 mL/min (ref >60)
GLUCOSE: 108 mg/dL — AB (ref 65–99)
POTASSIUM: 3.5 mmol/L (ref 3.5–5.1)
SODIUM: 137 mmol/L (ref 135–145)
TOTAL PROTEIN: 6.8 g/dL (ref 6.5–8.1)

## 2015-11-22 MED ORDER — SODIUM CHLORIDE 0.9 % IV SOLN
Freq: Once | INTRAVENOUS | Status: AC
  Administered 2015-11-22: 12:00:00 via INTRAVENOUS
  Filled 2015-11-22: qty 1000

## 2015-11-22 MED ORDER — SODIUM CHLORIDE 0.9% FLUSH
10.0000 mL | Freq: Once | INTRAVENOUS | Status: AC
  Administered 2015-11-22: 10 mL via INTRAVENOUS
  Filled 2015-11-22: qty 10

## 2015-11-22 MED ORDER — PACLITAXEL CHEMO INJECTION 300 MG/50ML
80.0000 mg/m2 | Freq: Once | INTRAVENOUS | Status: AC
  Administered 2015-11-22: 132 mg via INTRAVENOUS
  Filled 2015-11-22: qty 22

## 2015-11-22 MED ORDER — SODIUM CHLORIDE 0.9 % IV SOLN: 10.0000 mg | Freq: Once | INTRAVENOUS | Status: DC

## 2015-11-22 MED ORDER — DIPHENHYDRAMINE HCL 50 MG/ML IJ SOLN
50.0000 mg | Freq: Once | INTRAMUSCULAR | Status: AC
  Administered 2015-11-22: 50 mg via INTRAVENOUS
  Filled 2015-11-22: qty 1

## 2015-11-22 MED ORDER — HEPARIN SOD (PORK) LOCK FLUSH 100 UNIT/ML IV SOLN
500.0000 [IU] | Freq: Once | INTRAVENOUS | Status: AC
  Administered 2015-11-22: 500 [IU] via INTRAVENOUS
  Filled 2015-11-22: qty 5

## 2015-11-22 MED ORDER — FAMOTIDINE IN NACL 20-0.9 MG/50ML-% IV SOLN
20.0000 mg | Freq: Once | INTRAVENOUS | Status: AC
  Administered 2015-11-22: 20 mg via INTRAVENOUS
  Filled 2015-11-22: qty 50

## 2015-11-22 MED ORDER — SODIUM CHLORIDE 0.9 % IV SOLN: Freq: Once | INTRAVENOUS | Status: DC

## 2015-11-22 MED ORDER — SODIUM CHLORIDE 0.9 % IV SOLN
Freq: Once | INTRAVENOUS | Status: AC
  Administered 2015-11-22: 12:00:00 via INTRAVENOUS
  Filled 2015-11-22: qty 4

## 2015-11-27 ENCOUNTER — Encounter: Payer: Self-pay | Admitting: Oncology

## 2015-11-27 ENCOUNTER — Other Ambulatory Visit: Payer: Self-pay | Admitting: Family Medicine

## 2015-11-27 NOTE — Progress Notes (Signed)
[place] @ [facility] [phone]  [phone]   INITIAL CONSULT  Sandra [name] OB: [birth year]  MR#: [id number]  HWE#:[id number]  Patient Care Team: Lucille Passy, MD as PCP - General (Family Medicine) Glennie Isle, PA-C (Physician Assistant) Jerene Bears, MD as Consulting Physician (Gastroenterology) Lucille Passy, MD as Consulting Physician (Family Medicine) Seeplaputhur Robinette Haines, MD (General Surgery)  CHIEF COMPLAINT:  Chief Complaint  Patient presents with  . 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  Patient started Cytoxan and Adriamycin on now August 30, 2015 MUGA scan shows ejection fraction of baseline 55%.  3.Patient has finished total of 4 cycles of chemotherapy on November 01, 2015 4.  Started on the Taxol from November 22, 2015 VISIT DIAGNOSIS:     ICD-9-CM ICD-10-CM   1. 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    Oncology Flowsheet 07/13/2015 08/30/2015 09/20/2015 10/11/2015 11/01/2015 11/22/2015  Day, Cycle - Day 1, Cycle 1 Day 1, Cycle 2 Day 1, Cycle 3 Day 1, Cycle 4 Day 1, Cycle 1  cyclophosphamide (CYTOXAN) IV - 600 mg/m2 600 mg/m2 600 mg/m2 600 mg/m2 -  dexamethasone (DECADRON) IV - [ 12 mg ] [ 12 mg ] [ 12 mg ] [ 12 mg ] [ 10 mg ]  DOXOrubicin (ADRIAMYCIN) IV - 60 mg/m2 60 mg/m2 60 mg/m2 60 mg/m2 -  fosaprepitant (EMEND) IV - [ 150 mg ] [ 150 mg ] [ 150 mg ] [ 150 mg ] -  ondansetron (ZOFRAN) IV - - - - - [ 8 mg ]  PACLitaxel (TAXOL) IV - - - - - 80 mg/m2  palonosetron (ALOXI) IV - 0.25 mg 0.25 mg 0.25 mg  0.25 mg -  pegfilgrastim (NEULASTA ONPRO KIT) [place] - 6 mg 6 mg 6 mg 6 mg -    INTERVAL HISTORY: 70 year old lady who has no family history of breast cancer.  Had a distant history of estrogen replacement therapy only for 1 year.  Had abnormal mammogram which was evaluated underwent biopsy which was positive for invasive mammary carcinoma patient underwent lumpectomy and sentinel lymph node biopsy.  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  Patient is here to discuss port cycle of chemotherapy.  Tolerated 3 cycles of treatment very well.  Has an outpatient.  Nausea no vomiting.  Fatigue.  No tingling numbness.  Here for further follow-up and treatment consideration . Tolerated Cytoxan and Adriamycin very well here to begin Taxol chemotherapy on a weekly basis Nausea no vomiting or diarrhea appetite remains stable  REVIEW OF SYSTEMS:   GENERAL:  Feels good.  Active.  No fevers, sweats or weight loss. PERFORMANCE STATUS (ECOG):0 HEENT:  No visual changes, runny nose, sore throat, mouth sores or tenderness. Lungs: No shortness of breath or cough.  No hemoptysis. Cardiac:  No chest pain, palpitations, orthopnea, or PND. GI:  No nausea, vomiting, diarrhea, constipation, melena or hematochezia. GU:  No urgency, frequency, dysuria, or hematuria. Musculoskeletal:  No back pain.  No joint pain.  No muscle tenderness. Extremities:  No pain or swelling. Skin:  No rashes or skin changes. Neuro:  No headache, numbness or weakness, balance or coordination issues. Endocrine:  No diabetes, thyroid issues, hot flashes or night sweats. Psych:  No mood changes, depression or anxiety. Pain:  No focal pain. Review of systems:  All other systems reviewed and found to be negative.  As per HPI. Otherwise, a complete review of systems is negatve.  PAST MEDICAL HISTORY: Past Medical History  Diagnosis Date  . Hypertension   .  Hyperlipidemia   . Cancer ([place])   . Visual blurriness     SOMETHING NEW THAT HAS DEVELOPED SINCE 07-13-15 SURGERY- SEEN AT [facility] ENT AND EXAM WAS NORMAL PER PT (08-09-15)    PAST SURGICAL HISTORY: Past Surgical History  Procedure Laterality Date  . Abdominal hysterectomy  1987  . Fatty tumor Left 2013    side  . Bunionectomy Right 1993  . Breast biopsy Right     neg  . Breast biopsy Left     4 oclock, FIBROEPITHELIAL LESION FAVOR CELLULAR FIBROADENOMA  . Breast biopsy Left     2 oclock, CYSTIC APOCRINE METAPLASIA  . Breast biopsy Right 07/13/15    130 INVASIVE MAMMARY CARCINOMA, NO SPECIAL TYPE  . Breast lumpectomy with sentinel lymph node biopsy Right 07/13/2015    Procedure: BREAST LUMPECTOMY WITH SENTINEL LYMPH NODE BX;  Surgeon: Christene Lye, MD;  Location: [facility] ORS;  Service: General;  Laterality: Right;  . Breast lumpectomy Left 07/13/2015    Procedure: BREAST LUMPECTOMY;  Surgeon: Christene Lye, MD;  Location: [facility] ORS;  Service: General;  Laterality: Left;  . Portacath placement Left 08/16/2015    Procedure: INSERTION PORT-A-CATH;  Surgeon: Christene Lye, MD;  Location: [facility] ORS;  Service: General;  Laterality: Left;    FAMILY HISTORY Family History  Problem Relation Age of Onset  . Cancer Sister 50    breast cancer  . Colon cancer Neg Hx         ADVANCED DIRECTIVES:   Patient does not have any living will or healthcare power of attorney.  Information was given .  Available resources had been discussed.  We will follow-up on subsequent appointments regarding this issue HEALTH MAINTENANCE: Social History  Substance Use Topics  . Smoking status: Never Smoker   . Smokeless tobacco: Never Used  . Alcohol Use: No     Colonoscopy:  PAP:  Bone density:  Lipid panel:  Allergies  Allergen Reactions  . Lovastatin Anaphylaxis    Tongue swelling    Current Outpatient Prescriptions  Medication Sig Dispense Refill  . amLODipine (NORVASC) 5  MG tablet TAKE ONE TABLET BY MOUTH ONCE DAILY 90 tablet 1  . cholecalciferol (VITAMIN D) 1000 units tablet Take 1,000 Units by mouth daily.    . hydrochlorothiazide (HYDRODIURIL) 25 MG tablet TAKE ONE TABLET BY MOUTH ONCE DAILY 90 tablet 0  . HYDROcodone-acetaminophen (NORCO/VICODIN) 5-325 MG tablet Take 1 tablet by mouth every 6 (six) hours as needed for moderate pain. 10 tablet 0  . lidocaine-prilocaine (EMLA) cream Apply to port site 1-2 hours prior to port being accessed.  Cover with plastic wrap. 1 each 3  . ondansetron (ZOFRAN) 4 MG tablet Take 1 tablet (4 mg total) by mouth every 6 (six) hours as needed. 30 tablet 3  . Potassium Chloride ER 20 MEQ TBCR Take 1 tablet by mouth daily.    . pravastatin (PRAVACHOL) 10 MG tablet TAKE ONE TABLET BY MOUTH  ONCE DAILY 90 tablet 1   No current facility-administered medications for this visit.    OBJECTIVE: PHYSICAL EXAM: GENERAL:  Well developed, well nourished, sitting comfortably in the exam room in no acute distress. MENTAL STATUS:  Alert and oriented to person, place and time.  ENT:  Oropharynx clear without lesion.  Tongue normal. Mucous membranes moist. Alopecia.  RESPIRATORY:  Clear to auscultation without rales, wheezes or rhonchi. Patient has a port placed on the left upper chest wall area.  No evidence of redness. CARDIOVASCULAR:  Regular rate and rhythm without murmur, rub or gallop. BREAST:  Right breast without masses, wound has healed well.  skin changes or nipple discharge.  Left breast without masses, skin changes or nipple discharge. ABDOMEN:  Soft, non-tender, with active bowel sounds, and no hepatosplenomegaly.  No masses. BACK:  No CVA tenderness.  No tenderness on percussion of the back or rib cage. SKIN:  No rashes, ulcers or lesions. EXTREMITIES: No edema, no skin discoloration or tenderness.  No palpable cords. LYMPH NODES: No palpable cervical, supraclavicular, axillary or inguinal adenopathy  NEUROLOGICAL:  Unremarkable. PSYCH:  Appropriate.  Filed Vitals:   11/22/15 1016 11/22/15 1019  BP: 0/0 122/82  Pulse:    Temp:    Resp:       Body mass index is 24.31 kg/(m^2).    ECOG FS:0 - Asymptomatic  LAB RESULTS:  Infusion on 11/22/2015  Component Date Value Ref Range Status  . WBC 11/22/2015 2.8* 3.6 - 11.0 K/uL Final  . RBC 11/22/2015 3.44* 3.80 - 5.20 MIL/uL Final  . Hemoglobin 11/22/2015 10.9* 12.0 - 16.0 g/dL Final  . HCT 11/22/2015 31.3* 35.0 - 47.0 % Final  . MCV 11/22/2015 91.0  80.0 - 100.0 fL Final  . MCH 11/22/2015 31.6  26.0 - 34.0 pg Final  . MCHC 11/22/2015 34.8  32.0 - 36.0 g/dL Final  . RDW 11/22/2015 15.9* 11.5 - 14.5 % Final  . Platelets 11/22/2015 448* 150 - 440 K/uL Final  . Neutrophils Relative % 11/22/2015 57   Final  . Neutro Abs 11/22/2015 1.6  1.4 - 6.5 K/uL Final  . Lymphocytes Relative 11/22/2015 16   Final  . Lymphs Abs 11/22/2015 0.4* 1.0 - 3.6 K/uL Final  . Monocytes Relative 11/22/2015 21   Final  . Monocytes Absolute 11/22/2015 0.6  0.2 - 0.9 K/uL Final  . Eosinophils Relative 11/22/2015 2   Final  . Eosinophils Absolute 11/22/2015 0.0  0 - 0.7 K/uL Final  . Basophils Relative 11/22/2015 4   Final  . Basophils Absolute 11/22/2015 0.1  0 - 0.1 K/uL Final  . Sodium 11/22/2015 137  135 - 145 mmol/L Final  . Potassium 11/22/2015 3.5  3.5 - 5.1 mmol/L Final  . Chloride 11/22/2015 106  101 - 111 mmol/L Final  . CO2 11/22/2015 25  22 - 32 mmol/L Final  . Glucose, Bld 11/22/2015 108* 65 - 99 mg/dL Final  . BUN 11/22/2015 10  6 - 20 mg/dL Final  . Creatinine, Ser 11/22/2015 0.79  0.44 - 1.00 mg/dL Final  . Calcium 11/22/2015 8.8* 8.9 - 10.3 mg/dL Final  . Total Protein 11/22/2015 6.8  6.5 - 8.1 g/dL Final  . Albumin 11/22/2015 4.1  3.5 - 5.0 g/dL Final  . AST 11/22/2015 33  15 - 41 U/L Final  . ALT 11/22/2015 28  14 - 54 U/L Final  . Alkaline Phosphatase 11/22/2015 52  38 - 126 U/L Final  . Total Bilirubin 11/22/2015 0.5  0.3 -  1.2 mg/dL Final  . GFR  calc non Af Amer 11/22/2015 >60  >60 mL/min Final  . GFR calc Af Amer 11/22/2015 >60  >60 mL/min Final   Comment: (NOTE) The eGFR has been calculated using the CKD EPI equation. This calculation has not been validated in all clinical situations. eGFR's persistently <60 mL/min signify possible Chronic Kidney Disease.   . Anion gap 11/22/2015 6  5 - 15 Final     STUDIES: No results found.  ASSESSMENT: cancer   of right breast stage IC disease.  Estrogen receptor progesterone receptor positive HER-2 receptor negative.  With Mammo print score is high risk with average risk of 22% at 5 years distant recurrent disease or 29% at that 10 years. I 2Initiate Taxol weekly Informed consent was obtained.  Side effect including neuropathy has been discussed  Patient expressed understanding and was in agreement with this plan. She also understands that She can call clinic at any time with any questions, concerns, or complaints.    No matching staging information was found for the patient.  Forest Gleason, MD   11/27/2015 7:47 AM

## 2015-11-29 ENCOUNTER — Inpatient Hospital Stay: Payer: Medicare Other

## 2015-11-29 VITALS — BP 130/72 | HR 83 | Temp 97.3°F | Resp 20

## 2015-11-29 DIAGNOSIS — C50011 Malignant neoplasm of nipple and areola, right female breast: Secondary | ICD-10-CM

## 2015-11-29 DIAGNOSIS — C50211 Malignant neoplasm of upper-inner quadrant of right female breast: Secondary | ICD-10-CM

## 2015-11-29 LAB — COMPREHENSIVE METABOLIC PANEL
ALBUMIN: 4 g/dL (ref 3.5–5.0)
ALT: 30 U/L (ref 14–54)
AST: 27 U/L (ref 15–41)
Alkaline Phosphatase: 46 U/L (ref 38–126)
Anion gap: 4 — ABNORMAL LOW (ref 5–15)
BUN: 11 mg/dL (ref 6–20)
CALCIUM: 8.6 mg/dL — AB (ref 8.9–10.3)
CHLORIDE: 106 mmol/L (ref 101–111)
CO2: 26 mmol/L (ref 22–32)
CREATININE: 0.72 mg/dL (ref 0.44–1.00)
GFR calc Af Amer: >60 mL/min (ref >60)
GLUCOSE: 118 mg/dL — AB (ref 65–99)
Potassium: 3.1 mmol/L — ABNORMAL LOW (ref 3.5–5.1)
Sodium: 136 mmol/L (ref 135–145)
TOTAL PROTEIN: 6.5 g/dL (ref 6.5–8.1)
Total Bilirubin: 0.5 mg/dL (ref 0.3–1.2)

## 2015-11-29 LAB — CBC WITH DIFFERENTIAL/PLATELET
BASOS ABS: 0.1 10*3/uL (ref 0–0.1)
BASOS PCT: 5 %
Eosinophils Absolute: 0.3 10*3/uL (ref 0–0.7)
Eosinophils Relative: 10 %
HEMATOCRIT: 28.6 % — AB (ref 35.0–47.0)
Hemoglobin: 10.1 g/dL — ABNORMAL LOW (ref 12.0–16.0)
LYMPHS PCT: 19 %
Lymphs Abs: 0.5 10*3/uL — ABNORMAL LOW (ref 1.0–3.6)
MCH: 32.1 pg (ref 26.0–34.0)
MCHC: 35.5 g/dL (ref 32.0–36.0)
MCV: 90.4 fL (ref 80.0–100.0)
Monocytes Absolute: 0.3 10*3/uL (ref 0.2–0.9)
Monocytes Relative: 12 %
NEUTROS ABS: 1.4 10*3/uL (ref 1.4–6.5)
NEUTROS PCT: 54 %
Platelets: 377 10*3/uL (ref 150–440)
RBC: 3.16 MIL/uL — AB (ref 3.80–5.20)
RDW: 14.9 % — ABNORMAL HIGH (ref 11.5–14.5)
WBC: 2.5 10*3/uL — AB (ref 3.6–11.0)

## 2015-11-29 MED ORDER — SODIUM CHLORIDE 0.9 % IV SOLN: Freq: Once | INTRAVENOUS | Status: DC

## 2015-11-29 MED ORDER — FAMOTIDINE IN NACL 20-0.9 MG/50ML-% IV SOLN
20.0000 mg | Freq: Once | INTRAVENOUS | Status: AC
  Administered 2015-11-29: 20 mg via INTRAVENOUS
  Filled 2015-11-29: qty 50

## 2015-11-29 MED ORDER — SODIUM CHLORIDE 0.9% FLUSH
10.0000 mL | INTRAVENOUS | Status: DC | PRN
  Administered 2015-11-29: 10 mL
  Filled 2015-11-29: qty 10

## 2015-11-29 MED ORDER — SODIUM CHLORIDE 0.9 % IV SOLN: 10.0000 mg | Freq: Once | INTRAVENOUS | Status: DC

## 2015-11-29 MED ORDER — POTASSIUM CHLORIDE 2 MEQ/ML IV SOLN
Freq: Once | INTRAVENOUS | Status: AC
  Administered 2015-11-29: 10:00:00 via INTRAVENOUS
  Filled 2015-11-29: qty 100

## 2015-11-29 MED ORDER — SODIUM CHLORIDE 0.9 % IV SOLN
Freq: Once | INTRAVENOUS | Status: AC
  Administered 2015-11-29: 11:00:00 via INTRAVENOUS
  Filled 2015-11-29: qty 4

## 2015-11-29 MED ORDER — SODIUM CHLORIDE 0.9 % IV SOLN
80.0000 mg/m2 | Freq: Once | INTRAVENOUS | Status: AC
  Administered 2015-11-29: 132 mg via INTRAVENOUS
  Filled 2015-11-29: qty 22

## 2015-11-29 MED ORDER — HEPARIN SOD (PORK) LOCK FLUSH 100 UNIT/ML IV SOLN
500.0000 [IU] | Freq: Once | INTRAVENOUS | Status: AC | PRN
  Administered 2015-11-29: 500 [IU]
  Filled 2015-11-29: qty 5

## 2015-11-29 MED ORDER — DIPHENHYDRAMINE HCL 50 MG/ML IJ SOLN
50.0000 mg | Freq: Once | INTRAMUSCULAR | Status: AC
  Administered 2015-11-29: 50 mg via INTRAVENOUS
  Filled 2015-11-29: qty 1

## 2015-11-29 MED ORDER — SODIUM CHLORIDE 0.9 % IV SOLN
Freq: Once | INTRAVENOUS | Status: AC
  Administered 2015-11-29: 11:00:00 via INTRAVENOUS
  Filled 2015-11-29: qty 1000

## 2015-11-29 NOTE — Progress Notes (Signed)
ANC 1.4 today. Per Dr Oliva Bustard, okay to proceed with treatment

## 2015-12-06 ENCOUNTER — Inpatient Hospital Stay: Payer: Medicare Other | Attending: Oncology

## 2015-12-06 ENCOUNTER — Encounter: Payer: Self-pay | Admitting: Oncology

## 2015-12-06 ENCOUNTER — Inpatient Hospital Stay (HOSPITAL_BASED_OUTPATIENT_CLINIC_OR_DEPARTMENT_OTHER): Payer: Medicare Other | Admitting: Oncology

## 2015-12-06 ENCOUNTER — Inpatient Hospital Stay: Payer: Medicare Other

## 2015-12-06 VITALS — BP 127/81 | HR 76 | Temp 96.2°F | Resp 18 | Wt 131.5 lb

## 2015-12-06 DIAGNOSIS — Z79899 Other long term (current) drug therapy: Secondary | ICD-10-CM

## 2015-12-06 DIAGNOSIS — I10 Essential (primary) hypertension: Secondary | ICD-10-CM | POA: Insufficient documentation

## 2015-12-06 DIAGNOSIS — J3481 Nasal mucositis (ulcerative): Secondary | ICD-10-CM | POA: Insufficient documentation

## 2015-12-06 DIAGNOSIS — C50211 Malignant neoplasm of upper-inner quadrant of right female breast: Secondary | ICD-10-CM

## 2015-12-06 DIAGNOSIS — Z5111 Encounter for antineoplastic chemotherapy: Secondary | ICD-10-CM | POA: Insufficient documentation

## 2015-12-06 DIAGNOSIS — C50011 Malignant neoplasm of nipple and areola, right female breast: Secondary | ICD-10-CM | POA: Insufficient documentation

## 2015-12-06 DIAGNOSIS — G620 Drug-induced polyneuropathy: Secondary | ICD-10-CM | POA: Diagnosis not present

## 2015-12-06 DIAGNOSIS — R040 Epistaxis: Secondary | ICD-10-CM | POA: Insufficient documentation

## 2015-12-06 DIAGNOSIS — Z803 Family history of malignant neoplasm of breast: Secondary | ICD-10-CM | POA: Diagnosis not present

## 2015-12-06 DIAGNOSIS — E785 Hyperlipidemia, unspecified: Secondary | ICD-10-CM | POA: Insufficient documentation

## 2015-12-06 DIAGNOSIS — Z9223 Personal history of estrogen therapy: Secondary | ICD-10-CM | POA: Insufficient documentation

## 2015-12-06 DIAGNOSIS — T451X5S Adverse effect of antineoplastic and immunosuppressive drugs, sequela: Secondary | ICD-10-CM | POA: Insufficient documentation

## 2015-12-06 DIAGNOSIS — R439 Unspecified disturbances of smell and taste: Secondary | ICD-10-CM | POA: Insufficient documentation

## 2015-12-06 DIAGNOSIS — Z170 Estrogen receptor positive status [ER+]: Secondary | ICD-10-CM

## 2015-12-06 LAB — COMPREHENSIVE METABOLIC PANEL
ALBUMIN: 4.1 g/dL (ref 3.5–5.0)
ALT: 28 U/L (ref 14–54)
ANION GAP: 7 (ref 5–15)
AST: 30 U/L (ref 15–41)
Alkaline Phosphatase: 46 U/L (ref 38–126)
BUN: 8 mg/dL (ref 6–20)
CALCIUM: 9.2 mg/dL (ref 8.9–10.3)
CO2: 26 mmol/L (ref 22–32)
Chloride: 104 mmol/L (ref 101–111)
Creatinine, Ser: 0.66 mg/dL (ref 0.44–1.00)
GFR calc Af Amer: >60 mL/min (ref >60)
GFR calc non Af Amer: >60 mL/min (ref >60)
GLUCOSE: 114 mg/dL — AB (ref 65–99)
Potassium: 3.2 mmol/L — ABNORMAL LOW (ref 3.5–5.1)
SODIUM: 137 mmol/L (ref 135–145)
TOTAL PROTEIN: 6.6 g/dL (ref 6.5–8.1)
Total Bilirubin: 0.5 mg/dL (ref 0.3–1.2)

## 2015-12-06 LAB — CBC WITH DIFFERENTIAL/PLATELET
BASOS ABS: 0.1 10*3/uL (ref 0–0.1)
BASOS PCT: 4 %
EOS PCT: 10 %
Eosinophils Absolute: 0.2 10*3/uL (ref 0–0.7)
HCT: 29.3 % — ABNORMAL LOW (ref 35.0–47.0)
Hemoglobin: 10.3 g/dL — ABNORMAL LOW (ref 12.0–16.0)
Lymphocytes Relative: 18 %
Lymphs Abs: 0.4 10*3/uL — ABNORMAL LOW (ref 1.0–3.6)
MCH: 32.5 pg (ref 26.0–34.0)
MCHC: 35.2 g/dL (ref 32.0–36.0)
MCV: 92.3 fL (ref 80.0–100.0)
MONO ABS: 0.2 10*3/uL (ref 0.2–0.9)
Monocytes Relative: 10 %
NEUTROS ABS: 1.3 10*3/uL — AB (ref 1.4–6.5)
Neutrophils Relative %: 58 %
PLATELETS: 352 10*3/uL (ref 150–440)
RBC: 3.17 MIL/uL — ABNORMAL LOW (ref 3.80–5.20)
RDW: 14.8 % — AB (ref 11.5–14.5)
WBC: 2.3 10*3/uL — ABNORMAL LOW (ref 3.6–11.0)

## 2015-12-06 MED ORDER — POTASSIUM CHLORIDE ER 20 MEQ PO TBCR: 1.0000 | EXTENDED_RELEASE_TABLET | Freq: Every day | ORAL | Status: DC

## 2015-12-06 MED ORDER — DIPHENHYDRAMINE HCL 50 MG/ML IJ SOLN
50.0000 mg | Freq: Once | INTRAMUSCULAR | Status: AC
  Administered 2015-12-06: 50 mg via INTRAVENOUS
  Filled 2015-12-06: qty 1

## 2015-12-06 MED ORDER — SODIUM CHLORIDE 0.9 % IV SOLN
Freq: Once | INTRAVENOUS | Status: AC
  Administered 2015-12-06: 10:00:00 via INTRAVENOUS
  Filled 2015-12-06: qty 4

## 2015-12-06 MED ORDER — PACLITAXEL CHEMO INJECTION 300 MG/50ML
80.0000 mg/m2 | Freq: Once | INTRAVENOUS | Status: DC
  Filled 2015-12-06: qty 22

## 2015-12-06 MED ORDER — SODIUM CHLORIDE 0.9% FLUSH
10.0000 mL | INTRAVENOUS | Status: DC | PRN
  Administered 2015-12-06: 10 mL
  Filled 2015-12-06: qty 10

## 2015-12-06 MED ORDER — SODIUM CHLORIDE 0.9 % IV SOLN: Freq: Once | INTRAVENOUS | Status: DC

## 2015-12-06 MED ORDER — SODIUM CHLORIDE 0.9 % IV SOLN: 10.0000 mg | Freq: Once | INTRAVENOUS | Status: DC

## 2015-12-06 MED ORDER — FAMOTIDINE IN NACL 20-0.9 MG/50ML-% IV SOLN
20.0000 mg | Freq: Once | INTRAVENOUS | Status: AC
  Administered 2015-12-06: 20 mg via INTRAVENOUS
  Filled 2015-12-06: qty 50

## 2015-12-06 MED ORDER — HEPARIN SOD (PORK) LOCK FLUSH 100 UNIT/ML IV SOLN
500.0000 [IU] | Freq: Once | INTRAVENOUS | Status: AC | PRN
  Administered 2015-12-06: 500 [IU]
  Filled 2015-12-06: qty 5

## 2015-12-06 MED ORDER — SODIUM CHLORIDE 0.9 % IV SOLN
80.0000 mg/m2 | Freq: Once | INTRAVENOUS | Status: AC
  Administered 2015-12-06: 132 mg via INTRAVENOUS
  Filled 2015-12-06: qty 22

## 2015-12-06 MED ORDER — SODIUM CHLORIDE 0.9 % IV SOLN
Freq: Once | INTRAVENOUS | Status: AC
Start: 2015-12-06 — End: 2015-12-06
  Administered 2015-12-06: 10:00:00 via INTRAVENOUS
  Filled 2015-12-06: qty 1000

## 2015-12-06 NOTE — Progress Notes (Signed)
[place] @ [facility] [phone]) [phone]  Fax:(336) [phone]   INITIAL CONSULT  Sandra [name] OB: 1946/01/13  MR#: [id number]  HCW#:[id number]  Patient Care Team: Lucille Passy, MD as PCP - General (Family Medicine) Glennie [place], PA-C (Physician Assistant) Jerene Bears, MD as Consulting Physician (Gastroenterology) Lucille Passy, MD as Consulting Physician (Family Medicine) Seeplaputhur Robinette Haines, MD (General Surgery)  CHIEF COMPLAINT:  No chief complaint on file.  carcinoma breast (right) inner and upper quadrant T1 cN0 M0 tumor Estrogen receptor positive progesterone receptor positive HER-2 receptor negative by fish Mammo print shows high risk (diagnosis in January of 2016) patient has 22% average risk in 5 years and 29% average risk in 10 years for distant metastectomy disease without chemotherapy on anti-hormonal treatment 2.  Patient started Cytoxan and Adriamycin on now August 30, 2015 MUGA scan shows ejection fraction of baseline 55%.  3.Patient has finished total of 4 cycles of chemotherapy on November 01, 2015 4.  Started on the Taxol from November 22, 2015 VISIT DIAGNOSIS:   No diagnosis found.    No history exists.    Oncology Flowsheet 07/13/2015 08/30/2015 09/20/2015 10/11/2015 11/01/2015 11/22/2015 11/29/2015  Day, Cycle - Day 1, Cycle 1 Day 1, Cycle 2 Day 1, Cycle 3 Day 1, Cycle 4 Day 1, Cycle 1 Day 8, Cycle 1  cyclophosphamide (CYTOXAN) IV - 600 mg/m2 600 mg/m2 600 mg/m2 600 mg/m2 - -  dexamethasone (DECADRON) IV - [ 12 mg ] [ 12 mg ] [ 12 mg ] [ 12 mg ] [ 10 mg ] [ 10 mg ]  DOXOrubicin (ADRIAMYCIN) IV - 60 mg/m2 60 mg/m2 60 mg/m2 60 mg/m2 - -  fosaprepitant (EMEND) IV - [ 150 mg ] [ 150 mg ] [ 150 mg ] [ 150 mg ] - -  ondansetron (ZOFRAN) IV - - - - - [ 8 mg ] [ 8 mg ]  PACLitaxel (TAXOL) IV - - - - - 80 mg/m2 80 mg/m2  palonosetron (ALOXI) IV - 0.25 mg 0.25 mg 0.25 mg 0.25 mg - -  pegfilgrastim (NEULASTA ONPRO KIT) [place] - 6 mg 6 mg 6 mg 6 mg - -     INTERVAL HISTORY: 70 year old lady who has no family history of breast cancer.  Had a distant history of estrogen replacement therapy only for 1 year.  Had abnormal mammogram which was evaluated underwent biopsy which was positive for invasive mammary carcinoma patient underwent lumpectomy and sentinel lymph node biopsy.  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  . She is here to initiate the third and fourth cycle of Taxol chemotherapy.  No tingling.  No numbness.  Appetite has been stable.  No nausea.  No vomiting.  ALOPECIA no evidence of stomatitis noted any numbness REVIEW OF SYSTEMS:   GENERAL:  Feels good.  Active.  No fevers, sweats or weight loss. PERFORMANCE STATUS (ECOG):0 HEENT:  No visual changes, runny nose, sore throat, mouth sores or tenderness. Lungs: No shortness of breath or cough.  No hemoptysis. Cardiac:  No chest pain, palpitations, orthopnea, or PND. GI:  No nausea, vomiting, diarrhea, constipation, melena or hematochezia. GU:  No urgency, frequency, dysuria, or hematuria. Musculoskeletal:  No back pain.  No joint pain.  No muscle tenderness. Extremities:  No pain or swelling. Skin:  No rashes or skin changes. Neuro:  No headache, numbness or weakness, balance or coordination issues. Endocrine:  No diabetes,  thyroid issues, hot flashes or night sweats. Psych:  No mood changes, depression or anxiety. Pain:  No focal pain. Review of systems:  All other systems reviewed and found to be negative.  As per HPI. Otherwise, a complete review of systems is negatve.  PAST MEDICAL HISTORY: Past Medical History  Diagnosis Date  . Hypertension   . Hyperlipidemia   . Cancer ([place])   . Visual blurriness     SOMETHING NEW THAT HAS DEVELOPED SINCE 07-13-15 SURGERY- SEEN AT [facility] ENT AND EXAM WAS NORMAL PER PT (71-4-17)    PAST SURGICAL HISTORY: Past Surgical History  Procedure Laterality Date   . Abdominal hysterectomy  1987  . Fatty tumor Left 2013    side  . Bunionectomy Right 1993  . Breast biopsy Right     neg  . Breast biopsy Left     4 oclock, FIBROEPITHELIAL LESION FAVOR CELLULAR FIBROADENOMA  . Breast biopsy Left     2 oclock, CYSTIC APOCRINE METAPLASIA  . Breast biopsy Right 07/13/15    130 INVASIVE MAMMARY CARCINOMA, NO SPECIAL TYPE  . Breast lumpectomy with sentinel lymph node biopsy Right 07/13/2015    Procedure: BREAST LUMPECTOMY WITH SENTINEL LYMPH NODE BX;  Surgeon: Christene Lye, MD;  Location: [facility] ORS;  Service: General;  Laterality: Right;  . Breast lumpectomy Left 07/13/2015    Procedure: BREAST LUMPECTOMY;  Surgeon: Christene Lye, MD;  Location: [facility] ORS;  Service: General;  Laterality: Left;  . Portacath placement Left 08/16/2015    Procedure: INSERTION PORT-A-CATH;  Surgeon: Christene Lye, MD;  Location: [facility] ORS;  Service: General;  Laterality: Left;    FAMILY HISTORY Family History  Problem Relation Age of Onset  . Cancer Sister 27    breast cancer  . Colon cancer Neg Hx         ADVANCED DIRECTIVES:   Patient does not have any living will or healthcare power of attorney.  Information was given .  Available resources had been discussed.  We will follow-up on subsequent appointments regarding this issue HEALTH MAINTENANCE: Social History  Substance Use Topics  . Smoking status: Never Smoker   . Smokeless tobacco: Never Used  . Alcohol Use: No      :  Allergies  Allergen Reactions  . Lovastatin Anaphylaxis    Tongue swelling    Current Outpatient Prescriptions  Medication Sig Dispense Refill  . amLODipine (NORVASC) 5 MG tablet TAKE ONE TABLET BY MOUTH ONCE DAILY 90 tablet 1  . cholecalciferol (VITAMIN D) 1000 units tablet Take 1,000 Units by mouth daily.    . hydrochlorothiazide (HYDRODIURIL) 25 MG tablet TAKE ONE TABLET BY MOUTH ONCE DAILY 90 tablet 0  . HYDROcodone-acetaminophen (NORCO/VICODIN) 5-325 MG  tablet Take 1 tablet by mouth every 6 (six) hours as needed for moderate pain. 10 tablet 0  . lidocaine-prilocaine (EMLA) cream Apply to port site 1-2 hours prior to port being accessed.  Cover with plastic wrap. 1 each 3  . ondansetron (ZOFRAN) 4 MG tablet Take 1 tablet (4 mg total) by mouth every 6 (six) hours as needed. 30 tablet 3  . Potassium Chloride ER 20 MEQ TBCR Take 1 tablet by mouth daily.    . pravastatin (PRAVACHOL) 10 MG tablet TAKE ONE TABLET BY MOUTH ONCE DAILY 90 tablet 1   No current facility-administered medications for this visit.   Facility-Administered Medications Ordered in Other Visits  Medication Dose Route Frequency Provider Last Rate Last Dose  . heparin lock flush 100  unit/mL  500 Units Intracatheter Once PRN Forest Gleason, MD      . sodium chloride flush (NS) 0.9 % injection 10 mL  10 mL Intracatheter PRN Forest Gleason, MD   10 mL at 12/06/15 0848    OBJECTIVE: PHYSICAL EXAM: GENERAL:  Well developed, well nourished, sitting comfortably in the exam room in no acute distress. MENTAL STATUS:  Alert and oriented to person, place and time.  ENT:  Oropharynx clear without lesion.  Tongue normal. Mucous membranes moist. Alopecia.  RESPIRATORY:  Clear to auscultation without rales, wheezes or rhonchi. Patient has a port placed on the left upper chest wall area.  No evidence of redness. CARDIOVASCULAR:  Regular rate and rhythm without murmur, rub or gallop. BREAST:  Right breast without masses, wound has healed well.  skin changes or nipple discharge.  Left breast without masses, skin changes or nipple discharge. ABDOMEN:  Soft, non-tender, with active bowel sounds, and no hepatosplenomegaly.  No masses. BACK:  No CVA tenderness.  No tenderness on percussion of the back or rib cage. SKIN:  No rashes, ulcers or lesions. EXTREMITIES: No edema, no skin discoloration or tenderness.  No palpable cords. LYMPH NODES: No palpable cervical, supraclavicular, axillary or inguinal  adenopathy  NEUROLOGICAL: Unremarkable. PSYCH:  Appropriate.  There were no vitals filed for this visit.   There is no weight on file to calculate BMI.    ECOG FS:0 - Asymptomatic  LAB RESULTS:  Infusion on 12/06/2015  Component Date Value Ref Range Status  . WBC 12/06/2015 2.3* 3.6 - 11.0 K/uL Final  . RBC 12/06/2015 3.17* 3.80 - 5.20 MIL/uL Final  . Hemoglobin 12/06/2015 10.3* 12.0 - 16.0 g/dL Final  . HCT 12/06/2015 29.3* 35.0 - 47.0 % Final  . MCV 12/06/2015 92.3  80.0 - 100.0 fL Final  . MCH 12/06/2015 32.5  26.0 - 34.0 pg Final  . MCHC 12/06/2015 35.2  32.0 - 36.0 g/dL Final  . RDW 12/06/2015 14.8* 11.5 - 14.5 % Final  . Platelets 12/06/2015 352  150 - 440 K/uL Final  . Neutrophils Relative % 12/06/2015 58   Final  . Neutro Abs 12/06/2015 1.3* 1.4 - 6.5 K/uL Final  . Lymphocytes Relative 12/06/2015 18   Final  . Lymphs Abs 12/06/2015 0.4* 1.0 - 3.6 K/uL Final  . Monocytes Relative 12/06/2015 10   Final  . Monocytes Absolute 12/06/2015 0.2  0.2 - 0.9 K/uL Final  . Eosinophils Relative 12/06/2015 10   Final  . Eosinophils Absolute 12/06/2015 0.2  0 - 0.7 K/uL Final  . Basophils Relative 12/06/2015 4   Final  . Basophils Absolute 12/06/2015 0.1  0 - 0.1 K/uL Final     STUDIES: No results found.  ASSESSMENT: cancer   of right breast stage IC disease.  Estrogen receptor progesterone receptor positive HER-2 receptor negative.  With Mammo print score is high risk with average risk of 22% at 5 years distant recurrent disease or 29% at that 10 years. I 2Initiate Taxol weekly  Tolerating chemotherapy very well.  In your third and fourth cycle of chemotherapy minimal side effect no evidence of neuropathy All lab data has been reviewed   Patient expressed understanding and was in agreement with this plan. She also understands that She can call clinic at any time with any questions, concerns, or complaints.    No matching staging information was found for the patient.  Forest Gleason, MD   12/06/2015 9:00 AM

## 2015-12-11 ENCOUNTER — Encounter: Payer: Self-pay | Admitting: Oncology

## 2015-12-13 ENCOUNTER — Inpatient Hospital Stay: Payer: Medicare Other

## 2015-12-13 VITALS — BP 116/74 | HR 81 | Temp 97.0°F | Resp 18

## 2015-12-13 DIAGNOSIS — C50011 Malignant neoplasm of nipple and areola, right female breast: Secondary | ICD-10-CM

## 2015-12-13 DIAGNOSIS — C50211 Malignant neoplasm of upper-inner quadrant of right female breast: Secondary | ICD-10-CM

## 2015-12-13 DIAGNOSIS — C801 Malignant (primary) neoplasm, unspecified: Secondary | ICD-10-CM

## 2015-12-13 LAB — CBC WITH DIFFERENTIAL/PLATELET
Basophils Absolute: 0.1 10*3/uL (ref 0–0.1)
Basophils Relative: 3 %
EOS PCT: 7 %
Eosinophils Absolute: 0.2 10*3/uL (ref 0–0.7)
HEMATOCRIT: 29.6 % — AB (ref 35.0–47.0)
Hemoglobin: 10.5 g/dL — ABNORMAL LOW (ref 12.0–16.0)
LYMPHS PCT: 18 %
Lymphs Abs: 0.4 10*3/uL — ABNORMAL LOW (ref 1.0–3.6)
MCH: 32.4 pg (ref 26.0–34.0)
MCHC: 35.4 g/dL (ref 32.0–36.0)
MCV: 91.6 fL (ref 80.0–100.0)
MONO ABS: 0.2 10*3/uL (ref 0.2–0.9)
MONOS PCT: 10 %
NEUTROS ABS: 1.4 10*3/uL (ref 1.4–6.5)
Neutrophils Relative %: 62 %
PLATELETS: 334 10*3/uL (ref 150–440)
RBC: 3.24 MIL/uL — ABNORMAL LOW (ref 3.80–5.20)
RDW: 14.4 % (ref 11.5–14.5)
WBC: 2.3 10*3/uL — ABNORMAL LOW (ref 3.6–11.0)

## 2015-12-13 LAB — COMPREHENSIVE METABOLIC PANEL
ALT: 32 U/L (ref 14–54)
AST: 34 U/L (ref 15–41)
Albumin: 4 g/dL (ref 3.5–5.0)
Alkaline Phosphatase: 48 U/L (ref 38–126)
Anion gap: 7 (ref 5–15)
BUN: 10 mg/dL (ref 6–20)
CHLORIDE: 106 mmol/L (ref 101–111)
CO2: 24 mmol/L (ref 22–32)
CREATININE: 0.81 mg/dL (ref 0.44–1.00)
Calcium: 9 mg/dL (ref 8.9–10.3)
GFR calc non Af Amer: >60 mL/min (ref >60)
Glucose, Bld: 121 mg/dL — ABNORMAL HIGH (ref 65–99)
POTASSIUM: 3.5 mmol/L (ref 3.5–5.1)
Sodium: 137 mmol/L (ref 135–145)
Total Bilirubin: 0.4 mg/dL (ref 0.3–1.2)
Total Protein: 6.4 g/dL — ABNORMAL LOW (ref 6.5–8.1)

## 2015-12-13 LAB — MAGNESIUM: Magnesium: 1.7 mg/dL (ref 1.7–2.4)

## 2015-12-13 MED ORDER — SODIUM CHLORIDE 0.9 % IV SOLN: Freq: Once | INTRAVENOUS | Status: DC

## 2015-12-13 MED ORDER — HEPARIN SOD (PORK) LOCK FLUSH 100 UNIT/ML IV SOLN
500.0000 [IU] | Freq: Once | INTRAVENOUS | Status: AC | PRN
  Administered 2015-12-13: 500 [IU]
  Filled 2015-12-13: qty 5

## 2015-12-13 MED ORDER — SODIUM CHLORIDE 0.9 % IV SOLN: 10.0000 mg | Freq: Once | INTRAVENOUS | Status: DC

## 2015-12-13 MED ORDER — DIPHENHYDRAMINE HCL 50 MG/ML IJ SOLN
50.0000 mg | Freq: Once | INTRAMUSCULAR | Status: AC
  Administered 2015-12-13: 50 mg via INTRAVENOUS
  Filled 2015-12-13: qty 1

## 2015-12-13 MED ORDER — SODIUM CHLORIDE 0.9 % IV SOLN
Freq: Once | INTRAVENOUS | Status: AC
  Administered 2015-12-13: 09:00:00 via INTRAVENOUS
  Filled 2015-12-13: qty 1000

## 2015-12-13 MED ORDER — SODIUM CHLORIDE 0.9% FLUSH
10.0000 mL | INTRAVENOUS | Status: DC | PRN
  Administered 2015-12-13: 10 mL via INTRAVENOUS
  Filled 2015-12-13: qty 10

## 2015-12-13 MED ORDER — PACLITAXEL CHEMO INJECTION 300 MG/50ML
80.0000 mg/m2 | Freq: Once | INTRAVENOUS | Status: AC
  Administered 2015-12-13: 132 mg via INTRAVENOUS
  Filled 2015-12-13: qty 22

## 2015-12-13 MED ORDER — FAMOTIDINE IN NACL 20-0.9 MG/50ML-% IV SOLN
20.0000 mg | Freq: Once | INTRAVENOUS | Status: AC
  Administered 2015-12-13: 20 mg via INTRAVENOUS
  Filled 2015-12-13: qty 50

## 2015-12-13 MED ORDER — SODIUM CHLORIDE 0.9 % IV SOLN
Freq: Once | INTRAVENOUS | Status: AC
  Administered 2015-12-13: 10:00:00 via INTRAVENOUS
  Filled 2015-12-13: qty 4

## 2015-12-18 ENCOUNTER — Encounter: Payer: Self-pay | Admitting: *Deleted

## 2015-12-19 ENCOUNTER — Other Ambulatory Visit: Payer: Self-pay | Admitting: *Deleted

## 2015-12-19 DIAGNOSIS — C50011 Malignant neoplasm of nipple and areola, right female breast: Secondary | ICD-10-CM

## 2015-12-20 ENCOUNTER — Inpatient Hospital Stay: Payer: Medicare Other

## 2015-12-20 ENCOUNTER — Inpatient Hospital Stay (HOSPITAL_BASED_OUTPATIENT_CLINIC_OR_DEPARTMENT_OTHER): Payer: Medicare Other | Admitting: Oncology

## 2015-12-20 ENCOUNTER — Encounter: Payer: Self-pay | Admitting: Oncology

## 2015-12-20 VITALS — BP 129/76 | HR 90 | Temp 96.6°F | Resp 8 | Wt 132.5 lb

## 2015-12-20 DIAGNOSIS — Z803 Family history of malignant neoplasm of breast: Secondary | ICD-10-CM

## 2015-12-20 DIAGNOSIS — R439 Unspecified disturbances of smell and taste: Secondary | ICD-10-CM

## 2015-12-20 DIAGNOSIS — C50011 Malignant neoplasm of nipple and areola, right female breast: Secondary | ICD-10-CM

## 2015-12-20 DIAGNOSIS — I10 Essential (primary) hypertension: Secondary | ICD-10-CM

## 2015-12-20 DIAGNOSIS — R040 Epistaxis: Secondary | ICD-10-CM | POA: Diagnosis not present

## 2015-12-20 DIAGNOSIS — Z170 Estrogen receptor positive status [ER+]: Secondary | ICD-10-CM

## 2015-12-20 DIAGNOSIS — Z79899 Other long term (current) drug therapy: Secondary | ICD-10-CM

## 2015-12-20 DIAGNOSIS — C50211 Malignant neoplasm of upper-inner quadrant of right female breast: Secondary | ICD-10-CM

## 2015-12-20 DIAGNOSIS — E785 Hyperlipidemia, unspecified: Secondary | ICD-10-CM

## 2015-12-20 DIAGNOSIS — J3481 Nasal mucositis (ulcerative): Secondary | ICD-10-CM | POA: Diagnosis not present

## 2015-12-20 DIAGNOSIS — Z9223 Personal history of estrogen therapy: Secondary | ICD-10-CM

## 2015-12-20 DIAGNOSIS — T451X5S Adverse effect of antineoplastic and immunosuppressive drugs, sequela: Secondary | ICD-10-CM

## 2015-12-20 LAB — COMPREHENSIVE METABOLIC PANEL
ALK PHOS: 44 U/L (ref 38–126)
ALT: 24 U/L (ref 14–54)
ANION GAP: 8 (ref 5–15)
AST: 30 U/L (ref 15–41)
Albumin: 4 g/dL (ref 3.5–5.0)
BILIRUBIN TOTAL: 0.4 mg/dL (ref 0.3–1.2)
BUN: 12 mg/dL (ref 6–20)
CALCIUM: 8.7 mg/dL — AB (ref 8.9–10.3)
CO2: 25 mmol/L (ref 22–32)
Chloride: 105 mmol/L (ref 101–111)
Creatinine, Ser: 0.81 mg/dL (ref 0.44–1.00)
Glucose, Bld: 126 mg/dL — ABNORMAL HIGH (ref 65–99)
Potassium: 3.2 mmol/L — ABNORMAL LOW (ref 3.5–5.1)
Sodium: 138 mmol/L (ref 135–145)
TOTAL PROTEIN: 6.6 g/dL (ref 6.5–8.1)

## 2015-12-20 LAB — CBC WITH DIFFERENTIAL/PLATELET
Basophils Absolute: 0.1 10*3/uL (ref 0–0.1)
Basophils Relative: 2 %
EOS ABS: 0.2 10*3/uL (ref 0–0.7)
EOS PCT: 6 %
HCT: 29.8 % — ABNORMAL LOW (ref 35.0–47.0)
HEMOGLOBIN: 10.4 g/dL — AB (ref 12.0–16.0)
LYMPHS ABS: 0.4 10*3/uL — AB (ref 1.0–3.6)
LYMPHS PCT: 14 %
MCH: 32.2 pg (ref 26.0–34.0)
MCHC: 34.9 g/dL (ref 32.0–36.0)
MCV: 92.3 fL (ref 80.0–100.0)
MONOS PCT: 9 %
Monocytes Absolute: 0.2 10*3/uL (ref 0.2–0.9)
Neutro Abs: 1.8 10*3/uL (ref 1.4–6.5)
Neutrophils Relative %: 69 %
Platelets: 293 10*3/uL (ref 150–440)
RBC: 3.23 MIL/uL — ABNORMAL LOW (ref 3.80–5.20)
RDW: 14.4 % (ref 11.5–14.5)
WBC: 2.6 10*3/uL — ABNORMAL LOW (ref 3.6–11.0)

## 2015-12-20 LAB — MAGNESIUM: MAGNESIUM: 1.7 mg/dL (ref 1.7–2.4)

## 2015-12-20 MED ORDER — SODIUM CHLORIDE 0.9% FLUSH
10.0000 mL | INTRAVENOUS | Status: DC | PRN
  Administered 2015-12-20: 10 mL via INTRAVENOUS
  Filled 2015-12-20: qty 10

## 2015-12-20 MED ORDER — HEPARIN SOD (PORK) LOCK FLUSH 100 UNIT/ML IV SOLN
INTRAVENOUS | Status: AC
  Filled 2015-12-20: qty 5

## 2015-12-20 MED ORDER — DIPHENHYDRAMINE HCL 50 MG/ML IJ SOLN
50.0000 mg | Freq: Once | INTRAMUSCULAR | Status: AC
  Administered 2015-12-20: 50 mg via INTRAVENOUS
  Filled 2015-12-20: qty 1

## 2015-12-20 MED ORDER — SODIUM CHLORIDE 0.9 % IV SOLN
Freq: Once | INTRAVENOUS | Status: AC
  Administered 2015-12-20: 11:00:00 via INTRAVENOUS
  Filled 2015-12-20: qty 4

## 2015-12-20 MED ORDER — SODIUM CHLORIDE 0.9 % IV SOLN
Freq: Once | INTRAVENOUS | Status: AC
  Administered 2015-12-20: 10:00:00 via INTRAVENOUS
  Filled 2015-12-20: qty 1000

## 2015-12-20 MED ORDER — SODIUM CHLORIDE 0.9 % IV SOLN
Freq: Once | INTRAVENOUS | Status: DC
  Filled 2015-12-20: qty 4

## 2015-12-20 MED ORDER — DEXTROSE 5 % IV SOLN
80.0000 mg/m2 | Freq: Once | INTRAVENOUS | Status: AC
  Administered 2015-12-20: 132 mg via INTRAVENOUS
  Filled 2015-12-20: qty 22

## 2015-12-20 MED ORDER — HEPARIN SOD (PORK) LOCK FLUSH 100 UNIT/ML IV SOLN
500.0000 [IU] | Freq: Once | INTRAVENOUS | Status: AC
  Administered 2015-12-20: 500 [IU] via INTRAVENOUS

## 2015-12-20 MED ORDER — SODIUM CHLORIDE 0.9 % IV SOLN
10.0000 mg | Freq: Once | INTRAVENOUS | Status: DC
  Filled 2015-12-20: qty 1

## 2015-12-20 MED ORDER — FAMOTIDINE IN NACL 20-0.9 MG/50ML-% IV SOLN
20.0000 mg | Freq: Once | INTRAVENOUS | Status: AC
  Administered 2015-12-20: 20 mg via INTRAVENOUS
  Filled 2015-12-20: qty 50

## 2015-12-20 NOTE — Progress Notes (Signed)
Patient states she is having nose bleeds.  She has started using a humidifier.  Patient states her food no longer tastes good.

## 2015-12-25 ENCOUNTER — Encounter: Payer: Self-pay | Admitting: Oncology

## 2015-12-25 NOTE — Progress Notes (Signed)
[place] @ [facility] Telephone:(336) [phone]  Fax:(336) [phone]   INITIAL CONSULT  Sandra [name] OB: 1946-06-18  MR#: [id number]  HDQ#:[id number]  Patient Care Team: Lucille Passy, MD as PCP - General (Family Medicine) Glennie Isle, PA-C (Physician Assistant) Jerene Bears, MD as Consulting Physician (Gastroenterology) Lucille Passy, MD as Consulting Physician (Family Medicine) Seeplaputhur Robinette Haines, MD (General Surgery)  CHIEF COMPLAINT:  Chief Complaint  Patient presents with  . 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  Patient started Cytoxan and Adriamycin on now August 30, 2015 MUGA scan shows ejection fraction of baseline 55%.  3.Patient has finished total of 4 cycles of chemotherapy on November 01, 2015 4.  Started on the Taxol from November 22, 2015 VISIT DIAGNOSIS:     ICD-9-CM ICD-10-CM   1. 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      INTERVAL HISTORY: 70 year old lady who has no family history of breast cancer.  Had a distant history of estrogen replacement therapy only for 1 year.  Had abnormal mammogram which was evaluated underwent biopsy which was positive for invasive mammary carcinoma patient underwent lumpectomy and sentinel lymph node biopsy.  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  . She is here to initiate the third and fourth cycle of Taxol chemotherapy.  No tingling.  No numbness.  Appetite has been stable.  No nausea.   No vomiting.  ALOPECIA no evidence of stomatitis noted any numbness. Patient states she is having nose bleeds. She has started using a humidifier. Patient states her food no longer tastes good.  REVIEW OF SYSTEMS:   GENERAL:  Feels good.  Active.  No fevers, sweats or weight loss. PERFORMANCE STATUS (ECOG):0 HEENT:  No visual changes, runny nose, sore throat, mouth sores or tenderness.. Nose bleed off and on.  Patient has started using humidifier Lungs: No shortness of breath or cough.  No hemoptysis. Cardiac:  No chest pain, palpitations, orthopnea, or PND. GI:  No nausea, vomiting, diarrhea, constipation, melena or hematochezia. GU:  No urgency, frequency, dysuria, or hematuria. Musculoskeletal:  No back pain.  No joint pain.  No muscle tenderness. Extremities:  No pain or swelling. Skin:  No rashes or skin changes. Neuro:  No headache, numbness or weakness, balance or coordination issues. Endocrine:  No diabetes, thyroid issues, hot flashes or night sweats. Psych:  No mood changes, depression or anxiety. Pain:  No focal pain. Review of systems:  All other systems reviewed and found to be negative.  As per HPI. Otherwise, a complete review of systems is negatve.  PAST MEDICAL HISTORY: Past Medical History  Diagnosis Date  . Hypertension   . Hyperlipidemia   . Cancer ([place])   . Visual blurriness     SOMETHING NEW THAT HAS DEVELOPED SINCE 07-13-15 SURGERY- SEEN AT [facility] ENT AND EXAM WAS NORMAL PER PT (08-09-15)    PAST SURGICAL HISTORY: Past Surgical History  Procedure Laterality Date  . Abdominal hysterectomy  1987  . Fatty  tumor Left 2013    side  . Bunionectomy Right 1993  . Breast biopsy Right     neg  . Breast biopsy Left     4 oclock, FIBROEPITHELIAL LESION FAVOR CELLULAR FIBROADENOMA  . Breast biopsy Left     2 oclock, CYSTIC APOCRINE METAPLASIA  . Breast biopsy Right 07/13/15    130 INVASIVE MAMMARY CARCINOMA, NO SPECIAL TYPE  . Breast lumpectomy with sentinel  lymph node biopsy Right 07/13/2015    Procedure: BREAST LUMPECTOMY WITH SENTINEL LYMPH NODE BX;  Surgeon: Christene Lye, MD;  Location: [facility] ORS;  Service: General;  Laterality: Right;  . Breast lumpectomy Left 07/13/2015    Procedure: BREAST LUMPECTOMY;  Surgeon: Christene Lye, MD;  Location: [facility] ORS;  Service: General;  Laterality: Left;  . Portacath placement Left 08/16/2015    Procedure: INSERTION PORT-A-CATH;  Surgeon: Christene Lye, MD;  Location: [facility] ORS;  Service: General;  Laterality: Left;    FAMILY HISTORY Family History  Problem Relation Age of Onset  . Cancer Sister 50    breast cancer  . Colon cancer Neg Hx         ADVANCED DIRECTIVES:   Patient does not have any living will or healthcare power of attorney.  Information was given .  Available resources had been discussed.  We will follow-up on subsequent appointments regarding this issue HEALTH MAINTENANCE: Social History  Substance Use Topics  . Smoking status: Never Smoker   . Smokeless tobacco: Never Used  . Alcohol Use: No      :  Allergies  Allergen Reactions  . Lovastatin Anaphylaxis    Tongue swelling    Current Outpatient Prescriptions  Medication Sig Dispense Refill  . amLODipine (NORVASC) 5 MG tablet TAKE ONE TABLET BY MOUTH ONCE DAILY 90 tablet 1  . cholecalciferol (VITAMIN D) 1000 units tablet Take 1,000 Units by mouth daily.    . hydrochlorothiazide (HYDRODIURIL) 25 MG tablet TAKE ONE TABLET BY MOUTH ONCE DAILY 90 tablet 0  . HYDROcodone-acetaminophen (NORCO/VICODIN) 5-325 MG tablet Take 1 tablet by mouth every 6 (six) hours as needed for moderate pain. 10 tablet 0  . lidocaine-prilocaine (EMLA) cream Apply to port site 1-2 hours prior to port being accessed.  Cover with plastic wrap. 1 each 3  . ondansetron (ZOFRAN) 4 MG tablet Take 1 tablet (4 mg total) by mouth every 6 (six) hours as needed. 30 tablet 3  . Potassium Chloride ER 20 MEQ TBCR Take 1 tablet by mouth daily.  30 tablet 3  . pravastatin (PRAVACHOL) 10 MG tablet TAKE ONE TABLET BY MOUTH ONCE DAILY 90 tablet 1   No current facility-administered medications for this visit.    OBJECTIVE: PHYSICAL EXAM: GENERAL:  Well developed, well nourished, sitting comfortably in the exam room in no acute distress. MENTAL STATUS:  Alert and oriented to person, place and time.  ENT:  Oropharynx clear without lesion.  Tongue normal. Mucous membranes moist. Alopecia.  Examination of the nose shows redness.  No active bleeding. RESPIRATORY:  Clear to auscultation without rales, wheezes or rhonchi. Patient has a port placed on the left upper chest wall area.  No evidence of redness. CARDIOVASCULAR:  Regular rate and rhythm without murmur, rub or gallop.  ABDOMEN:  Soft, non-tender, with active bowel sounds, and no hepatosplenomegaly.  No masses. BACK:  No CVA tenderness.  No tenderness on percussion of the back or rib cage. SKIN:  No rashes, ulcers or lesions. EXTREMITIES: No edema, no skin discoloration  or tenderness.  No palpable cords. LYMPH NODES: No palpable cervical, supraclavicular, axillary or inguinal adenopathy  NEUROLOGICAL: Unremarkable. PSYCH:  Appropriate.  Filed Vitals:   12/20/15 0914  BP: 129/76  Pulse: 90  Temp: 96.6 F (35.9 C)  Resp: 8     Body mass index is 24.23 kg/(m^2).    ECOG FS:0 - Asymptomatic  LAB RESULTS:  Clinical Support on 12/20/2015  Component Date Value Ref Range Status  . WBC 12/20/2015 2.6* 3.6 - 11.0 K/uL Final  . RBC 12/20/2015 3.23* 3.80 - 5.20 MIL/uL Final  . Hemoglobin 12/20/2015 10.4* 12.0 - 16.0 g/dL Final  . HCT 12/20/2015 29.8* 35.0 - 47.0 % Final  . MCV 12/20/2015 92.3  80.0 - 100.0 fL Final  . MCH 12/20/2015 32.2  26.0 - 34.0 pg Final  . MCHC 12/20/2015 34.9  32.0 - 36.0 g/dL Final  . RDW 12/20/2015 14.4  11.5 - 14.5 % Final  . Platelets 12/20/2015 293  150 - 440 K/uL Final  . Neutrophils Relative % 12/20/2015 69   Final  . Neutro Abs 12/20/2015 1.8   1.4 - 6.5 K/uL Final  . Lymphocytes Relative 12/20/2015 14   Final  . Lymphs Abs 12/20/2015 0.4* 1.0 - 3.6 K/uL Final  . Monocytes Relative 12/20/2015 9   Final  . Monocytes Absolute 12/20/2015 0.2  0.2 - 0.9 K/uL Final  . Eosinophils Relative 12/20/2015 6   Final  . Eosinophils Absolute 12/20/2015 0.2  0 - 0.7 K/uL Final  . Basophils Relative 12/20/2015 2   Final  . Basophils Absolute 12/20/2015 0.1  0 - 0.1 K/uL Final  . Sodium 12/20/2015 138  135 - 145 mmol/L Final  . Potassium 12/20/2015 3.2* 3.5 - 5.1 mmol/L Final  . Chloride 12/20/2015 105  101 - 111 mmol/L Final  . CO2 12/20/2015 25  22 - 32 mmol/L Final  . Glucose, Bld 12/20/2015 126* 65 - 99 mg/dL Final  . BUN 12/20/2015 12  6 - 20 mg/dL Final  . Creatinine, Ser 12/20/2015 0.81  0.44 - 1.00 mg/dL Final  . Calcium 12/20/2015 8.7* 8.9 - 10.3 mg/dL Final  . Total Protein 12/20/2015 6.6  6.5 - 8.1 g/dL Final  . Albumin 12/20/2015 4.0  3.5 - 5.0 g/dL Final  . AST 12/20/2015 30  15 - 41 U/L Final  . ALT 12/20/2015 24  14 - 54 U/L Final  . Alkaline Phosphatase 12/20/2015 44  38 - 126 U/L Final  . Total Bilirubin 12/20/2015 0.4  0.3 - 1.2 mg/dL Final  . GFR calc non Af Amer 12/20/2015 >60  >60 mL/min Final  . GFR calc Af Amer 12/20/2015 >60  >60 mL/min Final   Comment: (NOTE) The eGFR has been calculated using the CKD EPI equation. This calculation has not been validated in all clinical situations. eGFR's persistently <60 mL/min signify possible Chronic Kidney Disease.   . Anion gap 12/20/2015 8  5 - 15 Final  . Magnesium 12/20/2015 1.7  1.7 - 2.4 mg/dL Final     STUDIES: No results found.  ASSESSMENT: cancer   of right breast stage IC disease.  Estrogen receptor progesterone receptor positive HER-2 receptor negative.  With Mammo print score is high risk with average risk of 22% at 5 years distant recurrent disease or 29% at that 10 years. I   Continue Taxol chemotherapy.  Nosebleed is secondary due to mucositis secondary  to Taxol and will be continue to observe. All lab data has been reviewed.  No evidence of  neuropathy.   Patient expressed understanding and was in agreement with this plan. She also understands that She can call clinic at any time with any questions, concerns, or complaints.    No matching staging information was found for the patient.  Forest Gleason, MD   12/25/2015 5:45 PM

## 2015-12-27 ENCOUNTER — Inpatient Hospital Stay: Payer: Medicare Other

## 2015-12-27 VITALS — BP 110/73 | HR 96 | Temp 97.8°F | Resp 18

## 2015-12-27 DIAGNOSIS — C50211 Malignant neoplasm of upper-inner quadrant of right female breast: Secondary | ICD-10-CM

## 2015-12-27 DIAGNOSIS — C50011 Malignant neoplasm of nipple and areola, right female breast: Secondary | ICD-10-CM

## 2015-12-27 LAB — CBC WITH DIFFERENTIAL/PLATELET
Basophils Absolute: 0.1 10*3/uL (ref 0–0.1)
Basophils Relative: 2 %
EOS PCT: 5 %
Eosinophils Absolute: 0.1 10*3/uL (ref 0–0.7)
HEMATOCRIT: 29.8 % — AB (ref 35.0–47.0)
Hemoglobin: 10.5 g/dL — ABNORMAL LOW (ref 12.0–16.0)
LYMPHS ABS: 0.4 10*3/uL — AB (ref 1.0–3.6)
LYMPHS PCT: 14 %
MCH: 32.3 pg (ref 26.0–34.0)
MCHC: 35.2 g/dL (ref 32.0–36.0)
MCV: 92 fL (ref 80.0–100.0)
MONO ABS: 0.2 10*3/uL (ref 0.2–0.9)
Monocytes Relative: 9 %
Neutro Abs: 1.9 10*3/uL (ref 1.4–6.5)
Neutrophils Relative %: 70 %
PLATELETS: 294 10*3/uL (ref 150–440)
RBC: 3.24 MIL/uL — ABNORMAL LOW (ref 3.80–5.20)
RDW: 14.5 % (ref 11.5–14.5)
WBC: 2.7 10*3/uL — ABNORMAL LOW (ref 3.6–11.0)

## 2015-12-27 MED ORDER — SODIUM CHLORIDE 0.9% FLUSH
10.0000 mL | Freq: Once | INTRAVENOUS | Status: AC
  Administered 2015-12-27: 10 mL via INTRAVENOUS
  Filled 2015-12-27: qty 10

## 2015-12-27 MED ORDER — PACLITAXEL CHEMO INJECTION 300 MG/50ML
80.0000 mg/m2 | Freq: Once | INTRAVENOUS | Status: AC
  Administered 2015-12-27: 132 mg via INTRAVENOUS
  Filled 2015-12-27: qty 22

## 2015-12-27 MED ORDER — FAMOTIDINE IN NACL 20-0.9 MG/50ML-% IV SOLN
20.0000 mg | Freq: Once | INTRAVENOUS | Status: AC
  Administered 2015-12-27: 20 mg via INTRAVENOUS
  Filled 2015-12-27: qty 50

## 2015-12-27 MED ORDER — SODIUM CHLORIDE 0.9 % IV SOLN
Freq: Once | INTRAVENOUS | Status: AC
  Administered 2015-12-27: 11:00:00 via INTRAVENOUS
  Filled 2015-12-27: qty 4

## 2015-12-27 MED ORDER — SODIUM CHLORIDE 0.9 % IV SOLN: Freq: Once | INTRAVENOUS | Status: DC

## 2015-12-27 MED ORDER — DIPHENHYDRAMINE HCL 50 MG/ML IJ SOLN
50.0000 mg | Freq: Once | INTRAMUSCULAR | Status: AC
  Administered 2015-12-27: 50 mg via INTRAVENOUS
  Filled 2015-12-27: qty 1

## 2015-12-27 MED ORDER — SODIUM CHLORIDE 0.9 % IV SOLN: 10.0000 mg | Freq: Once | INTRAVENOUS | Status: DC

## 2015-12-27 MED ORDER — HEPARIN SOD (PORK) LOCK FLUSH 100 UNIT/ML IV SOLN
500.0000 [IU] | Freq: Once | INTRAVENOUS | Status: AC
  Administered 2015-12-27: 500 [IU] via INTRAVENOUS
  Filled 2015-12-27: qty 5

## 2015-12-27 MED ORDER — SODIUM CHLORIDE 0.9 % IV SOLN
Freq: Once | INTRAVENOUS | Status: AC
  Administered 2015-12-27: 10:00:00 via INTRAVENOUS
  Filled 2015-12-27: qty 1000

## 2015-12-27 NOTE — Progress Notes (Signed)
Per MD, Dr. Oliva Bustard, order: CBC with Differential to be drawn today. Comprehensive Metabolic Panel should not be drawn today.

## 2016-01-02 ENCOUNTER — Ambulatory Visit
Admission: RE | Admit: 2016-01-02 | Discharge: 2016-01-02 | Disposition: A | Discharge status: Home / Self Care | Payer: Medicare Other | Source: Ambulatory Visit | Attending: General Surgery | Admitting: General Surgery

## 2016-01-02 ENCOUNTER — Other Ambulatory Visit: Payer: Self-pay | Admitting: General Surgery

## 2016-01-02 ENCOUNTER — Other Ambulatory Visit: Payer: Self-pay | Admitting: *Deleted

## 2016-01-02 DIAGNOSIS — C50011 Malignant neoplasm of nipple and areola, right female breast: Secondary | ICD-10-CM

## 2016-01-02 DIAGNOSIS — C50211 Malignant neoplasm of upper-inner quadrant of right female breast: Secondary | ICD-10-CM

## 2016-01-02 DIAGNOSIS — Z853 Personal history of malignant neoplasm of breast: Secondary | ICD-10-CM | POA: Diagnosis present

## 2016-01-02 DIAGNOSIS — Z9889 Other specified postprocedural states: Secondary | ICD-10-CM | POA: Insufficient documentation

## 2016-01-03 ENCOUNTER — Inpatient Hospital Stay: Payer: Medicare Other

## 2016-01-03 ENCOUNTER — Encounter: Payer: Self-pay | Admitting: Oncology

## 2016-01-03 ENCOUNTER — Inpatient Hospital Stay (HOSPITAL_BASED_OUTPATIENT_CLINIC_OR_DEPARTMENT_OTHER): Payer: Medicare Other | Admitting: Oncology

## 2016-01-03 VITALS — BP 116/75 | HR 93 | Temp 96.7°F | Resp 18 | Wt 129.2 lb

## 2016-01-03 DIAGNOSIS — I10 Essential (primary) hypertension: Secondary | ICD-10-CM

## 2016-01-03 DIAGNOSIS — C50011 Malignant neoplasm of nipple and areola, right female breast: Secondary | ICD-10-CM

## 2016-01-03 DIAGNOSIS — Z170 Estrogen receptor positive status [ER+]: Secondary | ICD-10-CM

## 2016-01-03 DIAGNOSIS — E785 Hyperlipidemia, unspecified: Secondary | ICD-10-CM

## 2016-01-03 DIAGNOSIS — Z9223 Personal history of estrogen therapy: Secondary | ICD-10-CM

## 2016-01-03 DIAGNOSIS — T451X5S Adverse effect of antineoplastic and immunosuppressive drugs, sequela: Secondary | ICD-10-CM

## 2016-01-03 DIAGNOSIS — Z79899 Other long term (current) drug therapy: Secondary | ICD-10-CM

## 2016-01-03 DIAGNOSIS — G620 Drug-induced polyneuropathy: Secondary | ICD-10-CM | POA: Diagnosis not present

## 2016-01-03 DIAGNOSIS — Z803 Family history of malignant neoplasm of breast: Secondary | ICD-10-CM

## 2016-01-03 DIAGNOSIS — C50211 Malignant neoplasm of upper-inner quadrant of right female breast: Secondary | ICD-10-CM

## 2016-01-03 LAB — MAGNESIUM: MAGNESIUM: 1.7 mg/dL (ref 1.7–2.4)

## 2016-01-03 LAB — CBC WITH DIFFERENTIAL/PLATELET
Basophils Absolute: 0 10*3/uL (ref 0–0.1)
Basophils Relative: 2 %
EOS ABS: 0.1 10*3/uL (ref 0–0.7)
EOS PCT: 4 %
HCT: 30.1 % — ABNORMAL LOW (ref 35.0–47.0)
Hemoglobin: 10.5 g/dL — ABNORMAL LOW (ref 12.0–16.0)
LYMPHS ABS: 0.4 10*3/uL — AB (ref 1.0–3.6)
LYMPHS PCT: 17 %
MCH: 31.7 pg (ref 26.0–34.0)
MCHC: 34.9 g/dL (ref 32.0–36.0)
MCV: 91 fL (ref 80.0–100.0)
MONO ABS: 0.2 10*3/uL (ref 0.2–0.9)
Monocytes Relative: 9 %
Neutro Abs: 1.5 10*3/uL (ref 1.4–6.5)
Neutrophils Relative %: 68 %
PLATELETS: 334 10*3/uL (ref 150–440)
RBC: 3.31 MIL/uL — ABNORMAL LOW (ref 3.80–5.20)
RDW: 14.6 % — AB (ref 11.5–14.5)
WBC: 2.3 10*3/uL — ABNORMAL LOW (ref 3.6–11.0)

## 2016-01-03 LAB — COMPREHENSIVE METABOLIC PANEL
ALK PHOS: 52 U/L (ref 38–126)
ALT: 25 U/L (ref 14–54)
AST: 31 U/L (ref 15–41)
Albumin: 4.1 g/dL (ref 3.5–5.0)
Anion gap: 9 (ref 5–15)
BUN: 11 mg/dL (ref 6–20)
CALCIUM: 8.8 mg/dL — AB (ref 8.9–10.3)
CHLORIDE: 105 mmol/L (ref 101–111)
CO2: 24 mmol/L (ref 22–32)
CREATININE: 0.85 mg/dL (ref 0.44–1.00)
Glucose, Bld: 148 mg/dL — ABNORMAL HIGH (ref 65–99)
Potassium: 3.3 mmol/L — ABNORMAL LOW (ref 3.5–5.1)
Sodium: 138 mmol/L (ref 135–145)
Total Bilirubin: 0.6 mg/dL (ref 0.3–1.2)
Total Protein: 6.6 g/dL (ref 6.5–8.1)

## 2016-01-03 MED ORDER — SODIUM CHLORIDE 0.9 % IV SOLN: Freq: Once | INTRAVENOUS | Status: DC

## 2016-01-03 MED ORDER — PACLITAXEL CHEMO INJECTION 300 MG/50ML
80.0000 mg/m2 | Freq: Once | INTRAVENOUS | Status: AC
  Administered 2016-01-03: 132 mg via INTRAVENOUS
  Filled 2016-01-03: qty 22

## 2016-01-03 MED ORDER — SODIUM CHLORIDE 0.9 % IV SOLN: 10.0000 mg | Freq: Once | INTRAVENOUS | Status: DC

## 2016-01-03 MED ORDER — DIPHENHYDRAMINE HCL 50 MG/ML IJ SOLN
50.0000 mg | Freq: Once | INTRAMUSCULAR | Status: AC
  Administered 2016-01-03: 50 mg via INTRAVENOUS
  Filled 2016-01-03: qty 1

## 2016-01-03 MED ORDER — SODIUM CHLORIDE 0.9% FLUSH
10.0000 mL | INTRAVENOUS | Status: DC | PRN
  Administered 2016-01-03: 10 mL
  Filled 2016-01-03: qty 10

## 2016-01-03 MED ORDER — FAMOTIDINE IN NACL 20-0.9 MG/50ML-% IV SOLN
20.0000 mg | Freq: Once | INTRAVENOUS | Status: AC
  Administered 2016-01-03: 20 mg via INTRAVENOUS
  Filled 2016-01-03: qty 50

## 2016-01-03 MED ORDER — HEPARIN SOD (PORK) LOCK FLUSH 100 UNIT/ML IV SOLN
500.0000 [IU] | Freq: Once | INTRAVENOUS | Status: AC | PRN
  Administered 2016-01-03: 500 [IU]
  Filled 2016-01-03: qty 5

## 2016-01-03 MED ORDER — SODIUM CHLORIDE 0.9 % IV SOLN
Freq: Once | INTRAVENOUS | Status: AC
Start: 2016-01-03 — End: 2016-01-03
  Administered 2016-01-03: 11:00:00 via INTRAVENOUS
  Filled 2016-01-03: qty 1000

## 2016-01-03 MED ORDER — SODIUM CHLORIDE 0.9 % IV SOLN
Freq: Once | INTRAVENOUS | Status: AC
  Administered 2016-01-03: 11:00:00 via INTRAVENOUS
  Filled 2016-01-03: qty 4

## 2016-01-03 NOTE — Progress Notes (Signed)
Patient states she is beginning to get neuropathy in her hands and feet with her hands being worse.

## 2016-01-04 ENCOUNTER — Encounter: Payer: Self-pay | Admitting: Oncology

## 2016-01-04 NOTE — Progress Notes (Signed)
[place] @ [facility] Telephone:(336) [phone]  Fax:(336) [phone]   INITIAL CONSULT  Sandra [name] OB: 04/23/1946  MR#: [id number]  QAS#:[id number]  Patient Care Team: Lucille Passy, MD as PCP - General (Family Medicine) Glennie Isle, PA-C (Physician Assistant) Jerene Bears, MD as Consulting Physician (Gastroenterology) Lucille Passy, MD as Consulting Physician (Family Medicine) Seeplaputhur Robinette Haines, MD (General Surgery)  CHIEF COMPLAINT:  Chief Complaint  Patient presents with  . 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  Patient started Cytoxan and Adriamycin on now August 30, 2015 MUGA scan shows ejection fraction of baseline 55%.  3.Patient has finished total of 4 cycles of chemotherapy on November 01, 2015 4.  Started on the Taxol from November 22, 2015 VISIT DIAGNOSIS:     ICD-9-CM ICD-10-CM   1. 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      INTERVAL HISTORY: 70 year old lady who has no family history of breast cancer.  Had a distant history of estrogen replacement therapy only for 1 year.  Had abnormal mammogram which was evaluated underwent biopsy which was positive for invasive mammary carcinoma patient underwent lumpectomy and sentinel lymph node biopsy.  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  .  Patient states she is beginning to get neuropathy in her hands and feet with her hands being worse   REVIEW OF SYSTEMS:   GENERAL:  Feels  good.  Active.  No fevers, sweats or weight loss. PERFORMANCE STATUS (ECOG):0 HEENT:  No visual changes, runny nose, sore throat, mouth sores or tenderness.. Nose bleed off and on.  Patient has started using humidifier Lungs: No shortness of breath or cough.  No hemoptysis. Cardiac:  No chest pain, palpitations, orthopnea, or PND. GI:  No nausea, vomiting, diarrhea, constipation, melena or hematochezia. GU:  No urgency, frequency, dysuria, or hematuria. Musculoskeletal:  No back pain.  No joint pain.  No muscle tenderness. Extremities:  No pain or swelling. Skin:  No rashes or skin changes. Neuro: Intermittent numbness in the upper extremity.  Does not have any difficulty holding any objects or buttoning and unbuttoning shirt. Endocrine:  No diabetes, thyroid issues, hot flashes or night sweats. Psych:  No mood changes, depression or anxiety. Pain:  No focal pain. Review of systems:  All other systems reviewed and found to be negative.  As per HPI. Otherwise, a complete review of systems is negatve.  PAST MEDICAL HISTORY: Past Medical History  Diagnosis Date  . Hypertension   . Hyperlipidemia   . Cancer ([place])   . Visual blurriness     SOMETHING NEW THAT HAS DEVELOPED SINCE 07-13-15 SURGERY- SEEN AT [facility] ENT AND EXAM WAS NORMAL PER PT (08-09-15)    PAST SURGICAL HISTORY: Past Surgical History  Procedure Laterality Date  . Abdominal hysterectomy  1987  . Fatty tumor Left 2013    side  . Bunionectomy Right 1993  . Breast biopsy Right     neg  . Breast biopsy Left  4 oclock, FIBROEPITHELIAL LESION FAVOR CELLULAR FIBROADENOMA  . Breast biopsy Left     2 oclock, CYSTIC APOCRINE METAPLASIA  . Breast biopsy Right 07/13/15    130 INVASIVE MAMMARY CARCINOMA, NO SPECIAL TYPE  . Breast lumpectomy with sentinel lymph node biopsy Right 07/13/2015    Procedure: BREAST LUMPECTOMY WITH SENTINEL LYMPH NODE BX;  Surgeon: Kieth Brightly, MD;  Location: [facility] ORS;  Service: General;   Laterality: Right;  . Breast lumpectomy Left 07/13/2015    Procedure: BREAST LUMPECTOMY;  Surgeon: Kieth Brightly, MD;  Location: [facility] ORS;  Service: General;  Laterality: Left;  . Portacath placement Left 08/16/2015    Procedure: INSERTION PORT-A-CATH;  Surgeon: Kieth Brightly, MD;  Location: [facility] ORS;  Service: General;  Laterality: Left;    FAMILY HISTORY Family History  Problem Relation Age of Onset  . Cancer Sister 68    breast cancer  . Breast cancer Sister 49  . Colon cancer Neg Hx         ADVANCED DIRECTIVES:   Patient does not have any living will or healthcare power of attorney.  Information was given .  Available resources had been discussed.  We will follow-up on subsequent appointments regarding this issue HEALTH MAINTENANCE: Social History  Substance Use Topics  . Smoking status: Never Smoker   . Smokeless tobacco: Never Used  . Alcohol Use: No      :  Allergies  Allergen Reactions  . Lovastatin Anaphylaxis    Tongue swelling    Current Outpatient Prescriptions  Medication Sig Dispense Refill  . amLODipine (NORVASC) 5 MG tablet TAKE ONE TABLET BY MOUTH ONCE DAILY 90 tablet 1  . cholecalciferol (VITAMIN D) 1000 units tablet Take 1,000 Units by mouth daily.    . hydrochlorothiazide (HYDRODIURIL) 25 MG tablet TAKE ONE TABLET BY MOUTH ONCE DAILY 90 tablet 0  . HYDROcodone-acetaminophen (NORCO/VICODIN) 5-325 MG tablet Take 1 tablet by mouth every 6 (six) hours as needed for moderate pain. 10 tablet 0  . lidocaine-prilocaine (EMLA) cream Apply to port site 1-2 hours prior to port being accessed.  Cover with plastic wrap. 1 each 3  . ondansetron (ZOFRAN) 4 MG tablet Take 1 tablet (4 mg total) by mouth every 6 (six) hours as needed. 30 tablet 3  . Potassium Chloride ER 20 MEQ TBCR Take 1 tablet by mouth daily. 30 tablet 3  . pravastatin (PRAVACHOL) 10 MG tablet TAKE ONE TABLET BY MOUTH ONCE DAILY 90 tablet 1   No current facility-administered  medications for this visit.    OBJECTIVE: PHYSICAL EXAM: GENERAL:  Well developed, well nourished, sitting comfortably in the exam room in no acute distress. MENTAL STATUS:  Alert and oriented to person, place and time.  ENT:  Oropharynx clear without lesion.  Tongue normal. Mucous membranes moist. Alopecia.  Examination of the nose shows redness.  No active bleeding. RESPIRATORY:  Clear to auscultation without rales, wheezes or rhonchi. Patient has a port placed on the left upper chest wall area.  No evidence of redness. CARDIOVASCULAR:  Regular rate and rhythm without murmur, rub or gallop.  ABDOMEN:  Soft, non-tender, with active bowel sounds, and no hepatosplenomegaly.  No masses. BACK:  No CVA tenderness.  No tenderness on percussion of the back or rib cage. SKIN:  No rashes, ulcers or lesions. EXTREMITIES: No edema, no skin discoloration or tenderness.  No palpable cords. LYMPH NODES: No palpable cervical, supraclavicular, axillary or inguinal adenopathy  NEUROLOGICAL: Unremarkable. PSYCH:  Appropriate.  Filed Vitals:  01/03/16 0944  BP: 116/75  Pulse: 93  Temp: 96.7 F (35.9 C)  Resp: 18     Body mass index is 23.62 kg/(m^2).    ECOG FS:0 - Asymptomatic  LAB RESULTS:  Clinical Support on 01/03/2016  Component Date Value Ref Range Status  . Sodium 01/03/2016 138  135 - 145 mmol/L Final  . Potassium 01/03/2016 3.3* 3.5 - 5.1 mmol/L Final  . Chloride 01/03/2016 105  101 - 111 mmol/L Final  . CO2 01/03/2016 24  22 - 32 mmol/L Final  . Glucose, Bld 01/03/2016 148* 65 - 99 mg/dL Final  . BUN 01/03/2016 11  6 - 20 mg/dL Final  . Creatinine, Ser 01/03/2016 0.85  0.44 - 1.00 mg/dL Final  . Calcium 01/03/2016 8.8* 8.9 - 10.3 mg/dL Final  . Total Protein 01/03/2016 6.6  6.5 - 8.1 g/dL Final  . Albumin 01/03/2016 4.1  3.5 - 5.0 g/dL Final  . AST 01/03/2016 31  15 - 41 U/L Final  . ALT 01/03/2016 25  14 - 54 U/L Final  . Alkaline Phosphatase 01/03/2016 52  38 - 126 U/L Final   . Total Bilirubin 01/03/2016 0.6  0.3 - 1.2 mg/dL Final  . GFR calc non Af Amer 01/03/2016 >60  >60 mL/min Final  . GFR calc Af Amer 01/03/2016 >60  >60 mL/min Final   Comment: (NOTE) The eGFR has been calculated using the CKD EPI equation. This calculation has not been validated in all clinical situations. eGFR's persistently <60 mL/min signify possible Chronic Kidney Disease.   . Anion gap 01/03/2016 9  5 - 15 Final  . Magnesium 01/03/2016 1.7  1.7 - 2.4 mg/dL Final  . WBC 01/03/2016 2.3* 3.6 - 11.0 K/uL Final  . RBC 01/03/2016 3.31* 3.80 - 5.20 MIL/uL Final  . Hemoglobin 01/03/2016 10.5* 12.0 - 16.0 g/dL Final  . HCT 01/03/2016 30.1* 35.0 - 47.0 % Final  . MCV 01/03/2016 91.0  80.0 - 100.0 fL Final  . MCH 01/03/2016 31.7  26.0 - 34.0 pg Final  . MCHC 01/03/2016 34.9  32.0 - 36.0 g/dL Final  . RDW 01/03/2016 14.6* 11.5 - 14.5 % Final  . Platelets 01/03/2016 334  150 - 440 K/uL Final  . Neutrophils Relative % 01/03/2016 68   Final  . Neutro Abs 01/03/2016 1.5  1.4 - 6.5 K/uL Final  . Lymphocytes Relative 01/03/2016 17   Final  . Lymphs Abs 01/03/2016 0.4* 1.0 - 3.6 K/uL Final  . Monocytes Relative 01/03/2016 9   Final  . Monocytes Absolute 01/03/2016 0.2  0.2 - 0.9 K/uL Final  . Eosinophils Relative 01/03/2016 4   Final  . Eosinophils Absolute 01/03/2016 0.1  0 - 0.7 K/uL Final  . Basophils Relative 01/03/2016 2   Final  . Basophils Absolute 01/03/2016 0.0  0 - 0.1 K/uL Final     STUDIES: Mm Diag Breast Tomo Uni Right  01/02/2016  CLINICAL DATA:  70 year old female presenting for six-month follow-up status post right breast lumpectomy in December of 2016. EXAM: 2D DIGITAL DIAGNOSTIC UNILATERAL RIGHT MAMMOGRAM WITH CAD AND ADJUNCT TOMO COMPARISON:  Previous exam(s). ACR Breast Density Category b: There are scattered areas of fibroglandular density. FINDINGS: Expected postsurgical changes noted in the medial right breast status post lumpectomy. No new suspicious calcifications,  masses or areas of distortion are seen in the right breast. Mammographic images were processed with CAD. IMPRESSION: 1. Expected post surgical changes in the medial right breast status post right lumpectomy. 2.  No evidence  of malignancy in the right breast. RECOMMENDATION: Bilateral diagnostic mammogram recommended in November of 2017 for routine annual postlumpectomy evaluation. I have discussed the findings and recommendations with the patient. Results were also provided in writing at the conclusion of the visit. If applicable, a reminder letter will be sent to the patient regarding the next appointment. BI-RADS CATEGORY  2: Benign. Electronically Signed   By: Ammie Ferrier M.D.   On: 01/02/2016 14:00    ASSESSMENT: cancer   of right breast stage IC disease.  Estrogen receptor progesterone receptor positive HER-2 receptor negative.  With Mammo print score is high risk with average risk of 22% at 5 years distant recurrent disease or 29% at that 10 years. I   Continue Taxol chemotherapy.   grade 1 neuropathy. We will be observed.  If neuropathy continues to get worse Taxol dose can be reduced or Taxol can be omitted. Patient expressed understanding and was in agreement with this plan. She also understands that She can call clinic at any time with any questions, concerns, or complaints.    No matching staging information was found for the patient.  Forest Gleason, MD   01/04/2016 8:08 AM

## 2016-01-08 ENCOUNTER — Encounter: Payer: Self-pay | Admitting: *Deleted

## 2016-01-09 ENCOUNTER — Ambulatory Visit (INDEPENDENT_AMBULATORY_CARE_PROVIDER_SITE_OTHER): Payer: Medicare Other | Admitting: General Surgery

## 2016-01-09 ENCOUNTER — Encounter: Payer: Self-pay | Admitting: General Surgery

## 2016-01-09 VITALS — BP 120/80 | HR 76 | Resp 12 | Ht 63.0 in | Wt 129.0 lb

## 2016-01-09 DIAGNOSIS — C50911 Malignant neoplasm of unspecified site of right female breast: Secondary | ICD-10-CM | POA: Diagnosis not present

## 2016-01-09 NOTE — Progress Notes (Signed)
Patient ID: Sandra [name], female   DOB: 18-Dec-1945, 70 y.o.   MRN: [id number]  Chief Complaint  Patient presents with  . Follow-up    mammogram    HPI Sandra [name] is a 70 y.o. female who presents for a breast cancer follow up. The most recent right mammogram was done on 12/05/15.  Pt has denies breast tenderness, masses, or nipple discharge.  Patient does perform regular self breast checks and gets regular mammograms done.   I have reviewed the history of present illness with the patient.   HPI  Past Medical History  Diagnosis Date  . Hypertension   . Hyperlipidemia   . Cancer ([place])   . Visual blurriness     SOMETHING NEW THAT HAS DEVELOPED SINCE 07-13-15 SURGERY- SEEN AT [facility] ENT AND EXAM WAS NORMAL PER PT (08-09-15)    Past Surgical History  Procedure Laterality Date  . Abdominal hysterectomy  1987  . Fatty tumor Left 2013    side  . Bunionectomy Right 1993  . Breast biopsy Right     neg  . Breast biopsy Left     4 oclock, FIBROEPITHELIAL LESION FAVOR CELLULAR FIBROADENOMA  . Breast biopsy Left     2 oclock, CYSTIC APOCRINE METAPLASIA  . Breast biopsy Right 07/13/15    130 INVASIVE MAMMARY CARCINOMA, NO SPECIAL TYPE  . Breast lumpectomy with sentinel lymph node biopsy Right 07/13/2015    Procedure: BREAST LUMPECTOMY WITH SENTINEL LYMPH NODE BX;  Surgeon: Christene Lye, MD;  Location: [facility] ORS;  Service: General;  Laterality: Right;  . Breast lumpectomy Left 07/13/2015    Procedure: BREAST LUMPECTOMY;  Surgeon: Christene Lye, MD;  Location: [facility] ORS;  Service: General;  Laterality: Left;  . Portacath placement Left 08/16/2015    Procedure: INSERTION PORT-A-CATH;  Surgeon: Christene Lye, MD;  Location: [facility] ORS;  Service: General;  Laterality: Left;    Family History  Problem Relation Age of Onset  . Cancer Sister 54    breast cancer  . Breast cancer Sister 24  . Colon cancer Neg Hx     Social History Social History   Substance Use Topics  . Smoking status: Never Smoker   . Smokeless tobacco: Never Used  . Alcohol Use: No    Allergies  Allergen Reactions  . Lovastatin Anaphylaxis    Tongue swelling    Current Outpatient Prescriptions  Medication Sig Dispense Refill  . amLODipine (NORVASC) 5 MG tablet TAKE ONE TABLET BY MOUTH ONCE DAILY 90 tablet 1  . cholecalciferol (VITAMIN D) 1000 units tablet Take 1,000 Units by mouth daily.    . hydrochlorothiazide (HYDRODIURIL) 25 MG tablet TAKE ONE TABLET BY MOUTH ONCE DAILY 90 tablet 0  . HYDROcodone-acetaminophen (NORCO/VICODIN) 5-325 MG tablet Take 1 tablet by mouth every 6 (six) hours as needed for moderate pain. 10 tablet 0  . lidocaine-prilocaine (EMLA) cream Apply to port site 1-2 hours prior to port being accessed.  Cover with plastic wrap. 1 each 3  . ondansetron (ZOFRAN) 4 MG tablet Take 1 tablet (4 mg total) by mouth every 6 (six) hours as needed. 30 tablet 3  . Potassium Chloride ER 20 MEQ TBCR Take 1 tablet by mouth daily. 30 tablet 3  . pravastatin (PRAVACHOL) 10 MG tablet TAKE ONE TABLET BY MOUTH ONCE DAILY 90 tablet 1   No current facility-administered medications for this visit.   Facility-Administered Medications Ordered in Other Visits  Medication Dose Route Frequency Provider Last Rate Last Dose  .  sodium chloride flush (NS) 0.9 % injection 10 mL  10 mL Intravenous PRN Forest Gleason, MD        Review of Systems Review of Systems  Constitutional: Negative.   Respiratory: Negative.   Cardiovascular: Negative.     Blood pressure 120/80, pulse 76, resp. rate 12, height 5\' 3"  (1.6 m), weight 129 lb (58.514 kg).  Physical Exam Physical Exam  Constitutional: She is oriented to person, place, and time. She appears well-developed and well-nourished.  Eyes: Conjunctivae are normal. No scleral icterus.  Neck: Neck supple.  Cardiovascular: Normal rate, regular rhythm and normal heart sounds.   Pulmonary/Chest: Effort normal and breath  sounds normal. Right breast exhibits no inverted nipple, no mass, no nipple discharge, no skin change and no tenderness. Left breast exhibits no inverted nipple, no mass, no nipple discharge, no skin change and no tenderness.    Port-a-cath site: nonerythematous and nontender  Abdominal: Soft. Normal appearance and bowel sounds are normal. There is no tenderness.  Lymphadenopathy:    She has no cervical adenopathy.    She has no axillary adenopathy.  Neurological: She is alert and oriented to person, place, and time.  Skin: Skin is warm and dry.    Data Reviewed Mammogram from 12/05/15 reviewed and stable with no changes  Assessment    Stable breast exam. Pt currently on chemotherapy for Stage I right breast cancer with high risk on Mammoprint. Overall, doing well.      Plan    Patient to return for bilateral diagnotic mammogram in six months. Pt is to continue self breast exams and notify the office of any changes.     PCP:  Arnette Norris  This information has been scribed by Gaspar Cola CMA.    Charlot Gouin G 01/10/2016, 3:24 PM

## 2016-01-09 NOTE — Patient Instructions (Signed)
Continue self breast exams. Call office for any new breast issues or concerns. 

## 2016-01-10 ENCOUNTER — Inpatient Hospital Stay: Payer: Medicare Other

## 2016-01-10 ENCOUNTER — Inpatient Hospital Stay: Payer: Medicare Other | Attending: Oncology

## 2016-01-10 ENCOUNTER — Encounter: Payer: Self-pay | Admitting: General Surgery

## 2016-01-10 VITALS — BP 114/74 | HR 83 | Resp 18

## 2016-01-10 DIAGNOSIS — Z5111 Encounter for antineoplastic chemotherapy: Secondary | ICD-10-CM | POA: Diagnosis not present

## 2016-01-10 DIAGNOSIS — C50011 Malignant neoplasm of nipple and areola, right female breast: Secondary | ICD-10-CM

## 2016-01-10 DIAGNOSIS — T451X5S Adverse effect of antineoplastic and immunosuppressive drugs, sequela: Secondary | ICD-10-CM | POA: Insufficient documentation

## 2016-01-10 DIAGNOSIS — C50211 Malignant neoplasm of upper-inner quadrant of right female breast: Secondary | ICD-10-CM

## 2016-01-10 DIAGNOSIS — Z803 Family history of malignant neoplasm of breast: Secondary | ICD-10-CM | POA: Insufficient documentation

## 2016-01-10 DIAGNOSIS — I10 Essential (primary) hypertension: Secondary | ICD-10-CM | POA: Diagnosis not present

## 2016-01-10 DIAGNOSIS — Z79899 Other long term (current) drug therapy: Secondary | ICD-10-CM | POA: Diagnosis not present

## 2016-01-10 DIAGNOSIS — Z170 Estrogen receptor positive status [ER+]: Secondary | ICD-10-CM | POA: Diagnosis not present

## 2016-01-10 DIAGNOSIS — G620 Drug-induced polyneuropathy: Secondary | ICD-10-CM | POA: Insufficient documentation

## 2016-01-10 DIAGNOSIS — E785 Hyperlipidemia, unspecified: Secondary | ICD-10-CM | POA: Diagnosis not present

## 2016-01-10 DIAGNOSIS — E876 Hypokalemia: Secondary | ICD-10-CM | POA: Insufficient documentation

## 2016-01-10 LAB — CBC WITH DIFFERENTIAL/PLATELET
BASOS ABS: 0.1 10*3/uL (ref 0–0.1)
BASOS PCT: 2 %
EOS ABS: 0.1 10*3/uL (ref 0–0.7)
Eosinophils Relative: 4 %
HEMATOCRIT: 30.6 % — AB (ref 35.0–47.0)
HEMOGLOBIN: 10.7 g/dL — AB (ref 12.0–16.0)
Lymphocytes Relative: 18 %
Lymphs Abs: 0.4 10*3/uL — ABNORMAL LOW (ref 1.0–3.6)
MCH: 31.8 pg (ref 26.0–34.0)
MCHC: 34.9 g/dL (ref 32.0–36.0)
MCV: 91 fL (ref 80.0–100.0)
MONO ABS: 0.2 10*3/uL (ref 0.2–0.9)
Monocytes Relative: 10 %
NEUTROS ABS: 1.6 10*3/uL (ref 1.4–6.5)
NEUTROS PCT: 66 %
Platelets: 362 10*3/uL (ref 150–440)
RBC: 3.36 MIL/uL — ABNORMAL LOW (ref 3.80–5.20)
RDW: 14.6 % — AB (ref 11.5–14.5)
WBC: 2.4 10*3/uL — ABNORMAL LOW (ref 3.6–11.0)

## 2016-01-10 MED ORDER — SODIUM CHLORIDE 0.9% FLUSH
10.0000 mL | INTRAVENOUS | Status: DC | PRN
  Filled 2016-01-10: qty 10

## 2016-01-10 MED ORDER — DIPHENHYDRAMINE HCL 50 MG/ML IJ SOLN
50.0000 mg | Freq: Once | INTRAMUSCULAR | Status: AC
  Administered 2016-01-10: 50 mg via INTRAVENOUS
  Filled 2016-01-10: qty 1

## 2016-01-10 MED ORDER — ONDANSETRON HCL 40 MG/20ML IJ SOLN
Freq: Once | INTRAMUSCULAR | Status: DC
  Filled 2016-01-10: qty 4

## 2016-01-10 MED ORDER — SODIUM CHLORIDE 0.9 % IV SOLN
Freq: Once | INTRAVENOUS | Status: AC
  Administered 2016-01-10: 12:00:00 via INTRAVENOUS
  Filled 2016-01-10: qty 4

## 2016-01-10 MED ORDER — FAMOTIDINE IN NACL 20-0.9 MG/50ML-% IV SOLN
20.0000 mg | Freq: Once | INTRAVENOUS | Status: AC
  Administered 2016-01-10: 20 mg via INTRAVENOUS
  Filled 2016-01-10: qty 50

## 2016-01-10 MED ORDER — SODIUM CHLORIDE 0.9 % IV SOLN
Freq: Once | INTRAVENOUS | Status: AC
  Administered 2016-01-10: 11:00:00 via INTRAVENOUS
  Filled 2016-01-10: qty 1000

## 2016-01-10 MED ORDER — SODIUM CHLORIDE 0.9 % IV SOLN: 10.0000 mg | Freq: Once | INTRAVENOUS | Status: DC

## 2016-01-10 MED ORDER — HEPARIN SOD (PORK) LOCK FLUSH 100 UNIT/ML IV SOLN
INTRAVENOUS | Status: AC
  Filled 2016-01-10: qty 5

## 2016-01-10 MED ORDER — HEPARIN SOD (PORK) LOCK FLUSH 100 UNIT/ML IV SOLN
500.0000 [IU] | Freq: Once | INTRAVENOUS | Status: AC
Start: 2016-01-10 — End: 2016-01-10
  Administered 2016-01-10: 500 [IU] via INTRAVENOUS

## 2016-01-10 MED ORDER — PACLITAXEL CHEMO INJECTION 300 MG/50ML
80.0000 mg/m2 | Freq: Once | INTRAVENOUS | Status: AC
  Administered 2016-01-10: 132 mg via INTRAVENOUS
  Filled 2016-01-10: qty 22

## 2016-01-16 ENCOUNTER — Other Ambulatory Visit: Payer: Self-pay | Admitting: *Deleted

## 2016-01-16 DIAGNOSIS — C50011 Malignant neoplasm of nipple and areola, right female breast: Secondary | ICD-10-CM

## 2016-01-17 ENCOUNTER — Inpatient Hospital Stay: Payer: Medicare Other

## 2016-01-17 ENCOUNTER — Inpatient Hospital Stay (HOSPITAL_BASED_OUTPATIENT_CLINIC_OR_DEPARTMENT_OTHER): Payer: Medicare Other | Admitting: Internal Medicine

## 2016-01-17 VITALS — BP 132/82 | HR 85 | Temp 97.4°F | Resp 18 | Wt 128.3 lb

## 2016-01-17 DIAGNOSIS — Z170 Estrogen receptor positive status [ER+]: Secondary | ICD-10-CM

## 2016-01-17 DIAGNOSIS — C50211 Malignant neoplasm of upper-inner quadrant of right female breast: Secondary | ICD-10-CM

## 2016-01-17 DIAGNOSIS — C50011 Malignant neoplasm of nipple and areola, right female breast: Secondary | ICD-10-CM

## 2016-01-17 DIAGNOSIS — T451X5A Adverse effect of antineoplastic and immunosuppressive drugs, initial encounter: Secondary | ICD-10-CM

## 2016-01-17 DIAGNOSIS — I10 Essential (primary) hypertension: Secondary | ICD-10-CM

## 2016-01-17 DIAGNOSIS — E876 Hypokalemia: Secondary | ICD-10-CM

## 2016-01-17 DIAGNOSIS — Z803 Family history of malignant neoplasm of breast: Secondary | ICD-10-CM

## 2016-01-17 DIAGNOSIS — E785 Hyperlipidemia, unspecified: Secondary | ICD-10-CM

## 2016-01-17 DIAGNOSIS — T451X5S Adverse effect of antineoplastic and immunosuppressive drugs, sequela: Secondary | ICD-10-CM

## 2016-01-17 DIAGNOSIS — G620 Drug-induced polyneuropathy: Secondary | ICD-10-CM | POA: Insufficient documentation

## 2016-01-17 DIAGNOSIS — Z79899 Other long term (current) drug therapy: Secondary | ICD-10-CM

## 2016-01-17 LAB — COMPREHENSIVE METABOLIC PANEL
ALBUMIN: 4.1 g/dL (ref 3.5–5.0)
ALT: 21 U/L (ref 14–54)
ANION GAP: 8 (ref 5–15)
AST: 28 U/L (ref 15–41)
Alkaline Phosphatase: 46 U/L (ref 38–126)
BUN: 7 mg/dL (ref 6–20)
CHLORIDE: 103 mmol/L (ref 101–111)
CO2: 23 mmol/L (ref 22–32)
Calcium: 8.6 mg/dL — ABNORMAL LOW (ref 8.9–10.3)
Creatinine, Ser: 0.83 mg/dL (ref 0.44–1.00)
GFR calc non Af Amer: >60 mL/min (ref >60)
GLUCOSE: 140 mg/dL — AB (ref 65–99)
POTASSIUM: 3.3 mmol/L — AB (ref 3.5–5.1)
SODIUM: 134 mmol/L — AB (ref 135–145)
Total Bilirubin: 0.7 mg/dL (ref 0.3–1.2)
Total Protein: 6.5 g/dL (ref 6.5–8.1)

## 2016-01-17 LAB — CBC WITH DIFFERENTIAL/PLATELET
BASOS PCT: 2 %
Basophils Absolute: 0 10*3/uL (ref 0–0.1)
EOS ABS: 0.1 10*3/uL (ref 0–0.7)
EOS PCT: 4 %
HCT: 29.5 % — ABNORMAL LOW (ref 35.0–47.0)
HEMOGLOBIN: 10.4 g/dL — AB (ref 12.0–16.0)
Lymphocytes Relative: 19 %
Lymphs Abs: 0.5 10*3/uL — ABNORMAL LOW (ref 1.0–3.6)
MCH: 32.1 pg (ref 26.0–34.0)
MCHC: 35.1 g/dL (ref 32.0–36.0)
MCV: 91.4 fL (ref 80.0–100.0)
MONO ABS: 0.2 10*3/uL (ref 0.2–0.9)
MONOS PCT: 9 %
NEUTROS PCT: 66 %
Neutro Abs: 1.5 10*3/uL (ref 1.4–6.5)
PLATELETS: 351 10*3/uL (ref 150–440)
RBC: 3.23 MIL/uL — ABNORMAL LOW (ref 3.80–5.20)
RDW: 14.8 % — AB (ref 11.5–14.5)
WBC: 2.3 10*3/uL — ABNORMAL LOW (ref 3.6–11.0)

## 2016-01-17 LAB — MAGNESIUM: Magnesium: 1.8 mg/dL (ref 1.7–2.4)

## 2016-01-17 MED ORDER — DIPHENHYDRAMINE HCL 50 MG/ML IJ SOLN
50.0000 mg | Freq: Once | INTRAMUSCULAR | Status: AC
Start: 2016-01-17 — End: 2016-01-17
  Administered 2016-01-17: 50 mg via INTRAVENOUS
  Filled 2016-01-17: qty 1

## 2016-01-17 MED ORDER — SODIUM CHLORIDE 0.9 % IV SOLN
Freq: Once | INTRAVENOUS | Status: AC
  Administered 2016-01-17: 11:00:00 via INTRAVENOUS
  Filled 2016-01-17: qty 1000

## 2016-01-17 MED ORDER — PACLITAXEL CHEMO INJECTION 300 MG/50ML
80.0000 mg/m2 | Freq: Once | INTRAVENOUS | Status: AC
  Administered 2016-01-17: 132 mg via INTRAVENOUS
  Filled 2016-01-17: qty 22

## 2016-01-17 MED ORDER — SODIUM CHLORIDE 0.9% FLUSH
10.0000 mL | INTRAVENOUS | Status: DC | PRN
  Administered 2016-01-17: 10 mL
  Filled 2016-01-17: qty 10

## 2016-01-17 MED ORDER — SODIUM CHLORIDE 0.9 % IV SOLN: Freq: Once | INTRAVENOUS | Status: DC

## 2016-01-17 MED ORDER — FAMOTIDINE IN NACL 20-0.9 MG/50ML-% IV SOLN
20.0000 mg | Freq: Once | INTRAVENOUS | Status: AC
  Administered 2016-01-17: 20 mg via INTRAVENOUS
  Filled 2016-01-17: qty 50

## 2016-01-17 MED ORDER — SODIUM CHLORIDE 0.9 % IV SOLN
Freq: Once | INTRAVENOUS | Status: AC
  Administered 2016-01-17: 12:00:00 via INTRAVENOUS
  Filled 2016-01-17: qty 4

## 2016-01-17 MED ORDER — HEPARIN SOD (PORK) LOCK FLUSH 100 UNIT/ML IV SOLN
500.0000 [IU] | Freq: Once | INTRAVENOUS | Status: AC | PRN
  Administered 2016-01-17: 500 [IU]
  Filled 2016-01-17: qty 5

## 2016-01-17 MED ORDER — SODIUM CHLORIDE 0.9 % IV SOLN: 10.0000 mg | Freq: Once | INTRAVENOUS | Status: DC

## 2016-01-17 NOTE — Assessment & Plan Note (Signed)
Potassium 3.3 recommend taking 20 mEq twice a day. We will recheck at subsequent visits.

## 2016-01-17 NOTE — Progress Notes (Signed)
Patient c/o neuropathy in her hands.

## 2016-01-17 NOTE — Assessment & Plan Note (Addendum)
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 Currently on adjuvant chemotherapy with Taxol weekly. Patient tolerating chemotherapy well except for mild side effects noted below.  Labs within normal limits- absolute neutrophil count 1.5. Mild hypokalemia discussed below. Proceed with weekly Taxol- patient has 3 more treatments excluding the one from today.  Patient will start radiation postchemotherapy. She'll be a candidate for adjuvant aromatase inhibitor.

## 2016-01-17 NOTE — Progress Notes (Signed)
[place] @ [facility] [phone]  [phone]   INITIAL CONSULT  Sandra [name] OB: 03-15-1946  MR#: [id number]  PPJ#:[id number]  Patient Care Team: Lucille Passy, MD as PCP - General (Family Medicine) Glennie Isle, PA-C (Physician Assistant) Jerene Bears, MD as Consulting Physician (Gastroenterology) Lucille Passy, MD as Consulting Physician (Family Medicine) Seeplaputhur Robinette Haines, MD (General Surgery)  CHIEF COMPLAINT:  Chief Complaint  Patient presents with  . 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  Patient started Cytoxan and Adriamycin on now August 30, 2015 MUGA scan shows ejection fraction of baseline 55%.  3.Patient has finished total of 4 cycles of chemotherapy on November 01, 2015 4.  Started on the Taxol from November 22, 2015 VISIT DIAGNOSIS:     ICD-9-CM ICD-10-CM   1. Hypokalemia 276.8 E87.6   2. 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      INTERVAL HISTORY: 70 year old lady History of stage I right-sided breast cancer currently on adjuvant chemotherapy with Taxol is here for follow-up.  Patient denies any fevers or chills. Denies any nausea vomiting or constipation. She has intermittent tingling of her upper or lower extremity is. This is fairly stable. This is not interrupting her daily life.  REVIEW OF SYSTEMS:  A complete 10 point  review of system is done which is negative for mentioned above in history of present illness.  PAST MEDICAL HISTORY: Past Medical History  Diagnosis Date  . Hypertension   . Hyperlipidemia   . Cancer ([place])   . Visual blurriness     SOMETHING NEW THAT HAS DEVELOPED SINCE 07-13-15 SURGERY- SEEN AT [facility] ENT AND EXAM WAS NORMAL PER PT (08-09-15)    PAST SURGICAL HISTORY: Past Surgical History  Procedure Laterality Date  . Abdominal hysterectomy  1987  . Fatty tumor Left 2013    side  . Bunionectomy Right 1993  . Breast biopsy Right     neg  . Breast biopsy Left     4 oclock, FIBROEPITHELIAL LESION FAVOR CELLULAR FIBROADENOMA  . Breast biopsy Left     2 oclock, CYSTIC APOCRINE METAPLASIA  . Breast biopsy Right 07/13/15    130 INVASIVE MAMMARY CARCINOMA, NO SPECIAL TYPE  . Breast lumpectomy with sentinel lymph node biopsy Right 07/13/2015    Procedure: BREAST LUMPECTOMY WITH SENTINEL LYMPH NODE BX;  Surgeon: Christene Lye, MD;  Location: [facility] ORS;  Service: General;  Laterality: Right;  . Breast lumpectomy Left 07/13/2015    Procedure: BREAST LUMPECTOMY;  Surgeon: Christene Lye, MD;  Location: [facility] ORS;  Service: General;  Laterality: Left;  . Portacath placement Left 08/16/2015    Procedure: INSERTION PORT-A-CATH;  Surgeon: Christene Lye, MD;  Location: [facility] ORS;  Service: General;  Laterality: Left;    FAMILY HISTORY Family History  Problem Relation Age of Onset  . Cancer Sister 69  breast cancer  . Breast cancer Sister 31  . Colon cancer Neg Hx         ADVANCED DIRECTIVES:   Patient does not have any living will or healthcare power of attorney.  Information was given .  Available resources had been discussed.  We will follow-up on subsequent appointments regarding this issue HEALTH MAINTENANCE: Social History  Substance Use Topics  . Smoking status: Never Smoker   . Smokeless tobacco: Never Used  . Alcohol Use: No      :  Allergies    Allergen Reactions  . Lovastatin Anaphylaxis    Tongue swelling    Current Outpatient Prescriptions  Medication Sig Dispense Refill  . amLODipine (NORVASC) 5 MG tablet TAKE ONE TABLET BY MOUTH ONCE DAILY 90 tablet 1  . cholecalciferol (VITAMIN D) 1000 units tablet Take 1,000 Units by mouth daily.    . hydrochlorothiazide (HYDRODIURIL) 25 MG tablet TAKE ONE TABLET BY MOUTH ONCE DAILY 90 tablet 0  . HYDROcodone-acetaminophen (NORCO/VICODIN) 5-325 MG tablet Take 1 tablet by mouth every 6 (six) hours as needed for moderate pain. 10 tablet 0  . lidocaine-prilocaine (EMLA) cream Apply to port site 1-2 hours prior to port being accessed.  Cover with plastic wrap. 1 each 3  . ondansetron (ZOFRAN) 4 MG tablet Take 1 tablet (4 mg total) by mouth every 6 (six) hours as needed. 30 tablet 3  . Potassium Chloride ER 20 MEQ TBCR Take 1 tablet by mouth daily. 30 tablet 3  . pravastatin (PRAVACHOL) 10 MG tablet TAKE ONE TABLET BY MOUTH ONCE DAILY 90 tablet 1   No current facility-administered medications for this visit.    OBJECTIVE: PHYSICAL EXAM: GENERAL:  Well developed, well nourished, sitting comfortably in the exam room in no acute distress. MENTAL STATUS:  Alert and oriented to person, place and time.  ENT:  Oropharynx clear without lesion.  Tongue normal. Mucous membranes moist. Alopecia.  Examination of the nose shows redness.  No active bleeding. RESPIRATORY:  Clear to auscultation without rales, wheezes or rhonchi. Patient has a port placed on the left upper chest wall area.  No evidence of redness. CARDIOVASCULAR:  Regular rate and rhythm without murmur, rub or gallop.  ABDOMEN:  Soft, non-tender, with active bowel sounds, and no hepatosplenomegaly.  No masses. BACK:  No CVA tenderness.  No tenderness on percussion of the back or rib cage. SKIN:  No rashes, ulcers or lesions. EXTREMITIES: No edema, no skin discoloration or tenderness.  No palpable cords. LYMPH NODES: No palpable  cervical, supraclavicular, axillary or inguinal adenopathy  NEUROLOGICAL: Unremarkable. PSYCH:  Appropriate.  Filed Vitals:   01/17/16 1030  BP: 132/82  Pulse: 85  Temp: 97.4 F (36.3 C)  Resp: 18     Body mass index is 22.73 kg/(m^2).    ECOG FS:0 - Asymptomatic  LAB RESULTS:  Appointment on 01/17/2016  Component Date Value Ref Range Status  . WBC 01/17/2016 2.3* 3.6 - 11.0 K/uL Final  . RBC 01/17/2016 3.23* 3.80 - 5.20 MIL/uL Final  . Hemoglobin 01/17/2016 10.4* 12.0 - 16.0 g/dL Final  . HCT 01/17/2016 29.5* 35.0 - 47.0 % Final  . MCV 01/17/2016 91.4  80.0 - 100.0 fL Final  . MCH 01/17/2016 32.1  26.0 - 34.0 pg Final  . MCHC 01/17/2016 35.1  32.0 - 36.0 g/dL Final  . RDW 01/17/2016 14.8* 11.5 - 14.5 % Final  . Platelets 01/17/2016 351  150 - 440 K/uL Final  . Neutrophils Relative % 01/17/2016 66  Final  . Neutro Abs 01/17/2016 1.5  1.4 - 6.5 K/uL Final  . Lymphocytes Relative 01/17/2016 19   Final  . Lymphs Abs 01/17/2016 0.5* 1.0 - 3.6 K/uL Final  . Monocytes Relative 01/17/2016 9   Final  . Monocytes Absolute 01/17/2016 0.2  0.2 - 0.9 K/uL Final  . Eosinophils Relative 01/17/2016 4   Final  . Eosinophils Absolute 01/17/2016 0.1  0 - 0.7 K/uL Final  . Basophils Relative 01/17/2016 2   Final  . Basophils Absolute 01/17/2016 0.0  0 - 0.1 K/uL Final  . Sodium 01/17/2016 134* 135 - 145 mmol/L Final  . Potassium 01/17/2016 3.3* 3.5 - 5.1 mmol/L Final  . Chloride 01/17/2016 103  101 - 111 mmol/L Final  . CO2 01/17/2016 23  22 - 32 mmol/L Final  . Glucose, Bld 01/17/2016 140* 65 - 99 mg/dL Final  . BUN 01/17/2016 7  6 - 20 mg/dL Final  . Creatinine, Ser 01/17/2016 0.83  0.44 - 1.00 mg/dL Final  . Calcium 01/17/2016 8.6* 8.9 - 10.3 mg/dL Final  . Total Protein 01/17/2016 6.5  6.5 - 8.1 g/dL Final  . Albumin 01/17/2016 4.1  3.5 - 5.0 g/dL Final  . AST 01/17/2016 28  15 - 41 U/L Final  . ALT 01/17/2016 21  14 - 54 U/L Final  . Alkaline Phosphatase 01/17/2016 46  38 - 126 U/L  Final  . Total Bilirubin 01/17/2016 0.7  0.3 - 1.2 mg/dL Final  . GFR calc non Af Amer 01/17/2016 >60  >60 mL/min Final  . GFR calc Af Amer 01/17/2016 >60  >60 mL/min Final   Comment: (NOTE) The eGFR has been calculated using the CKD EPI equation. This calculation has not been validated in all clinical situations. eGFR's persistently <60 mL/min signify possible Chronic Kidney Disease.   . Anion gap 01/17/2016 8  5 - 15 Final  . Magnesium 01/17/2016 1.8  1.7 - 2.4 mg/dL Final     STUDIES: Mm Diag Breast Tomo Uni Right  01/02/2016  CLINICAL DATA:  70 year old female presenting for six-month follow-up status post right breast lumpectomy in December of 2016. EXAM: 2D DIGITAL DIAGNOSTIC UNILATERAL RIGHT MAMMOGRAM WITH CAD AND ADJUNCT TOMO COMPARISON:  Previous exam(s). ACR Breast Density Category b: There are scattered areas of fibroglandular density. FINDINGS: Expected postsurgical changes noted in the medial right breast status post lumpectomy. No new suspicious calcifications, masses or areas of distortion are seen in the right breast. Mammographic images were processed with CAD. IMPRESSION: 1. Expected post surgical changes in the medial right breast status post right lumpectomy. 2.  No evidence of malignancy in the right breast. RECOMMENDATION: Bilateral diagnostic mammogram recommended in November of 2017 for routine annual postlumpectomy evaluation. I have discussed the findings and recommendations with the patient. Results were also provided in writing at the conclusion of the visit. If applicable, a reminder letter will be sent to the patient regarding the next appointment. BI-RADS CATEGORY  2: Benign. Electronically Signed   By: Ammie Ferrier M.D.   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  Hypokalemia Potassium 3.3 recommend taking 20 mEq twice a day. We will recheck at subsequent  visits.  Breast cancer, right ([place]) Stage I ER/PR positive HER-2/neu negative. Currently on adjuvant chemotherapy with Taxol weekly. Patient tolerating chemotherapy well except for mild side effects noted below.  Labs within normal limits- absolute neutrophil count 1.5. Mild hypokalemia  discussed below. Proceed with weekly Taxol- patient has 3 more treatments excluding the one from today.  Patient will start radiation postchemotherapy. She'll be a candidate for adjuvant aromatase inhibitor.   No matching staging information was found for the patient.  Cammie Sickle, MD   01/17/2016 8:12 PM

## 2016-01-17 NOTE — Assessment & Plan Note (Signed)
Grade 1 secondary to Taxol monitor for now.

## 2016-01-23 ENCOUNTER — Other Ambulatory Visit: Payer: Self-pay | Admitting: Internal Medicine

## 2016-01-24 ENCOUNTER — Inpatient Hospital Stay: Payer: Medicare Other

## 2016-01-24 VITALS — BP 118/70 | HR 94 | Temp 96.6°F | Resp 18

## 2016-01-24 DIAGNOSIS — C50011 Malignant neoplasm of nipple and areola, right female breast: Secondary | ICD-10-CM

## 2016-01-24 DIAGNOSIS — C50211 Malignant neoplasm of upper-inner quadrant of right female breast: Secondary | ICD-10-CM | POA: Diagnosis not present

## 2016-01-24 LAB — CBC WITH DIFFERENTIAL/PLATELET
Basophils Absolute: 0 10*3/uL (ref 0–0.1)
Basophils Relative: 2 %
EOS PCT: 2 %
Eosinophils Absolute: 0 10*3/uL (ref 0–0.7)
HCT: 30 % — ABNORMAL LOW (ref 35.0–47.0)
HEMOGLOBIN: 10.5 g/dL — AB (ref 12.0–16.0)
LYMPHS ABS: 0.5 10*3/uL — AB (ref 1.0–3.6)
LYMPHS PCT: 20 %
MCH: 31.7 pg (ref 26.0–34.0)
MCHC: 35.1 g/dL (ref 32.0–36.0)
MCV: 90.4 fL (ref 80.0–100.0)
MONOS PCT: 10 %
Monocytes Absolute: 0.2 10*3/uL (ref 0.2–0.9)
NEUTROS PCT: 66 %
Neutro Abs: 1.5 10*3/uL (ref 1.4–6.5)
Platelets: 374 10*3/uL (ref 150–440)
RBC: 3.32 MIL/uL — AB (ref 3.80–5.20)
RDW: 15.1 % — ABNORMAL HIGH (ref 11.5–14.5)
WBC: 2.3 10*3/uL — AB (ref 3.6–11.0)

## 2016-01-24 LAB — COMPREHENSIVE METABOLIC PANEL
ALK PHOS: 45 U/L (ref 38–126)
ALT: 21 U/L (ref 14–54)
AST: 25 U/L (ref 15–41)
Albumin: 4.2 g/dL (ref 3.5–5.0)
Anion gap: 10 (ref 5–15)
BUN: 7 mg/dL (ref 6–20)
CALCIUM: 9 mg/dL (ref 8.9–10.3)
CO2: 24 mmol/L (ref 22–32)
CREATININE: 0.75 mg/dL (ref 0.44–1.00)
Chloride: 100 mmol/L — ABNORMAL LOW (ref 101–111)
Glucose, Bld: 112 mg/dL — ABNORMAL HIGH (ref 65–99)
Potassium: 3.4 mmol/L — ABNORMAL LOW (ref 3.5–5.1)
Sodium: 134 mmol/L — ABNORMAL LOW (ref 135–145)
TOTAL PROTEIN: 6.6 g/dL (ref 6.5–8.1)
Total Bilirubin: 0.6 mg/dL (ref 0.3–1.2)

## 2016-01-24 MED ORDER — FAMOTIDINE IN NACL 20-0.9 MG/50ML-% IV SOLN
20.0000 mg | Freq: Once | INTRAVENOUS | Status: AC
  Administered 2016-01-24: 20 mg via INTRAVENOUS
  Filled 2016-01-24: qty 50

## 2016-01-24 MED ORDER — SODIUM CHLORIDE 0.9 % IV SOLN
Freq: Once | INTRAVENOUS | Status: AC
  Administered 2016-01-24: 12:00:00 via INTRAVENOUS
  Filled 2016-01-24: qty 4

## 2016-01-24 MED ORDER — DIPHENHYDRAMINE HCL 50 MG/ML IJ SOLN
50.0000 mg | Freq: Once | INTRAMUSCULAR | Status: AC
  Administered 2016-01-24: 50 mg via INTRAVENOUS
  Filled 2016-01-24: qty 1

## 2016-01-24 MED ORDER — SODIUM CHLORIDE 0.9 % IV SOLN: Freq: Once | INTRAVENOUS | Status: DC

## 2016-01-24 MED ORDER — SODIUM CHLORIDE 0.9 % IV SOLN
Freq: Once | INTRAVENOUS | Status: AC
  Administered 2016-01-24: 12:00:00 via INTRAVENOUS
  Filled 2016-01-24: qty 1000

## 2016-01-24 MED ORDER — SODIUM CHLORIDE 0.9 % IV SOLN: 10.0000 mg | Freq: Once | INTRAVENOUS | Status: DC

## 2016-01-24 MED ORDER — DEXTROSE 5 % IV SOLN
80.0000 mg/m2 | Freq: Once | INTRAVENOUS | Status: AC
  Administered 2016-01-24: 132 mg via INTRAVENOUS
  Filled 2016-01-24: qty 22

## 2016-01-24 MED ORDER — HEPARIN SOD (PORK) LOCK FLUSH 100 UNIT/ML IV SOLN
500.0000 [IU] | Freq: Once | INTRAVENOUS | Status: AC | PRN
  Administered 2016-01-24: 500 [IU]
  Filled 2016-01-24: qty 5

## 2016-01-24 MED ORDER — SODIUM CHLORIDE 0.9% FLUSH
10.0000 mL | INTRAVENOUS | Status: DC | PRN
Start: 2016-01-24 — End: 2016-01-24
  Administered 2016-01-24: 10 mL
  Filled 2016-01-24: qty 10

## 2016-01-31 ENCOUNTER — Inpatient Hospital Stay: Payer: Medicare Other

## 2016-01-31 ENCOUNTER — Inpatient Hospital Stay (HOSPITAL_BASED_OUTPATIENT_CLINIC_OR_DEPARTMENT_OTHER): Payer: Medicare Other | Admitting: Internal Medicine

## 2016-01-31 VITALS — BP 105/73 | HR 99 | Temp 97.1°F | Resp 18 | Wt 124.1 lb

## 2016-01-31 DIAGNOSIS — E876 Hypokalemia: Secondary | ICD-10-CM | POA: Diagnosis not present

## 2016-01-31 DIAGNOSIS — C50211 Malignant neoplasm of upper-inner quadrant of right female breast: Secondary | ICD-10-CM

## 2016-01-31 DIAGNOSIS — E785 Hyperlipidemia, unspecified: Secondary | ICD-10-CM

## 2016-01-31 DIAGNOSIS — T451X5S Adverse effect of antineoplastic and immunosuppressive drugs, sequela: Secondary | ICD-10-CM

## 2016-01-31 DIAGNOSIS — T451X5A Adverse effect of antineoplastic and immunosuppressive drugs, initial encounter: Secondary | ICD-10-CM

## 2016-01-31 DIAGNOSIS — C50011 Malignant neoplasm of nipple and areola, right female breast: Secondary | ICD-10-CM

## 2016-01-31 DIAGNOSIS — Z95828 Presence of other vascular implants and grafts: Secondary | ICD-10-CM

## 2016-01-31 DIAGNOSIS — I10 Essential (primary) hypertension: Secondary | ICD-10-CM

## 2016-01-31 DIAGNOSIS — C50811 Malignant neoplasm of overlapping sites of right female breast: Secondary | ICD-10-CM

## 2016-01-31 DIAGNOSIS — G620 Drug-induced polyneuropathy: Secondary | ICD-10-CM | POA: Diagnosis not present

## 2016-01-31 DIAGNOSIS — Z170 Estrogen receptor positive status [ER+]: Secondary | ICD-10-CM

## 2016-01-31 DIAGNOSIS — Z803 Family history of malignant neoplasm of breast: Secondary | ICD-10-CM

## 2016-01-31 DIAGNOSIS — Z79899 Other long term (current) drug therapy: Secondary | ICD-10-CM

## 2016-01-31 LAB — COMPREHENSIVE METABOLIC PANEL
ALBUMIN: 4.3 g/dL (ref 3.5–5.0)
ALT: 20 U/L (ref 14–54)
ANION GAP: 10 (ref 5–15)
AST: 24 U/L (ref 15–41)
Alkaline Phosphatase: 41 U/L (ref 38–126)
BUN: 6 mg/dL (ref 6–20)
CHLORIDE: 100 mmol/L — AB (ref 101–111)
CO2: 24 mmol/L (ref 22–32)
Calcium: 9.2 mg/dL (ref 8.9–10.3)
Creatinine, Ser: 0.63 mg/dL (ref 0.44–1.00)
GFR calc Af Amer: >60 mL/min (ref >60)
GFR calc non Af Amer: >60 mL/min (ref >60)
GLUCOSE: 113 mg/dL — AB (ref 65–99)
POTASSIUM: 3 mmol/L — AB (ref 3.5–5.1)
Sodium: 134 mmol/L — ABNORMAL LOW (ref 135–145)
Total Bilirubin: 0.3 mg/dL (ref 0.3–1.2)
Total Protein: 6.6 g/dL (ref 6.5–8.1)

## 2016-01-31 LAB — CBC WITH DIFFERENTIAL/PLATELET
BASOS ABS: 0 10*3/uL (ref 0–0.1)
BASOS PCT: 2 %
EOS ABS: 0 10*3/uL (ref 0–0.7)
EOS PCT: 2 %
HCT: 30.6 % — ABNORMAL LOW (ref 35.0–47.0)
Hemoglobin: 10.5 g/dL — ABNORMAL LOW (ref 12.0–16.0)
Lymphocytes Relative: 19 %
Lymphs Abs: 0.4 10*3/uL — ABNORMAL LOW (ref 1.0–3.6)
MCH: 31 pg (ref 26.0–34.0)
MCHC: 34.5 g/dL (ref 32.0–36.0)
MCV: 89.8 fL (ref 80.0–100.0)
MONO ABS: 0.3 10*3/uL (ref 0.2–0.9)
Monocytes Relative: 11 %
Neutro Abs: 1.5 10*3/uL (ref 1.4–6.5)
Neutrophils Relative %: 66 %
PLATELETS: 376 10*3/uL (ref 150–440)
RBC: 3.4 MIL/uL — ABNORMAL LOW (ref 3.80–5.20)
RDW: 15.1 % — AB (ref 11.5–14.5)
WBC: 2.2 10*3/uL — AB (ref 3.6–11.0)

## 2016-01-31 MED ORDER — HEPARIN SOD (PORK) LOCK FLUSH 100 UNIT/ML IV SOLN
500.0000 [IU] | Freq: Once | INTRAVENOUS | Status: AC
  Administered 2016-01-31: 500 [IU] via INTRAVENOUS
  Filled 2016-01-31: qty 5

## 2016-01-31 MED ORDER — SODIUM CHLORIDE 0.9% FLUSH
10.0000 mL | INTRAVENOUS | Status: DC | PRN
  Administered 2016-01-31: 10 mL via INTRAVENOUS
  Filled 2016-01-31: qty 10

## 2016-01-31 MED ORDER — GABAPENTIN 300 MG PO CAPS: 300.0000 mg | ORAL_CAPSULE | Freq: Three times a day (TID) | ORAL | Status: DC

## 2016-01-31 NOTE — Assessment & Plan Note (Signed)
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 Currently on adjuvant chemotherapy with Taxol weekly. Patient tolerating chemotherapy well except for mod-severe side effects noted below.  Patient is currently status post 10 treatments of Taxol.   # PN-2- hold chemotoday; re-eval in 2 weeks/ for weekly taxol.  If patient continues to have significant neuropathy we will discontinue Taxol.  Also start the patient on Neurontin 300 mg twice a day  #  Hypokalemia- continue potassium supplementation  # Patient will start radiation postchemotherapy. She'll be a candidate for adjuvant aromatase inhibitor.  #  Follow up in 2 weeks/ labs Taxol-. Neuropathy improved

## 2016-01-31 NOTE — Assessment & Plan Note (Addendum)
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 Currently on adjuvant chemotherapy with Taxol weekly. Patient tolerating chemotherapy well except for mod-severe side effects noted below.  Patient is currently status post 10 treatments of Taxol.   # PN-2- hold chemotoday; re-eval in 2 weeks/ for weekly taxol.   #  Hypokalemia- continue potassium supplementation  #   Patient will start radiation postchemotherapy. She'll be a candidate for adjuvant aromatase inhibitor.

## 2016-01-31 NOTE — Progress Notes (Signed)
Patient states she continues to have neuropathy pain in her hands and feet.   It is becoming harder for her to button buttons.  Asking for something to help with leg pain at night so she can sleep.

## 2016-01-31 NOTE — Progress Notes (Signed)
[place] @ [facility] Telephone:(336) [phone]  Fax:(336) [phone]   INITIAL CONSULT  Sandra [name] OB: 04-06-1946  MR#: [id number]  DGL#:[id number]  Patient Care Team: Lucille Passy, MD as PCP - General (Family Medicine) Glennie Isle, PA-C (Physician Assistant) Jerene Bears, MD as Consulting Physician (Gastroenterology) Lucille Passy, MD as Consulting Physician (Family Medicine) Seeplaputhur Robinette Haines, MD (General Surgery)  CHIEF COMPLAINT:  Chief Complaint  Patient presents with  . Breast Cancer   VISIT DIAGNOSIS:     ICD-9-CM ICD-10-CM   1. Malignant neoplasm of overlapping sites of right female breast (HCC) 174.8 C50.811 CBC with Differential     Comprehensive metabolic panel  2. Hypokalemia 276.8 E87.6 Comprehensive metabolic panel  3. Peripheral neuropathy due to chemotherapy (HCC) 357.7 G62.0 gabapentin (NEURONTIN) 300 MG capsule   E933.1 T45.1X5A   4. Breast cancer of upper-inner quadrant of right female breast (HCC) 174.2 C50.211   5. Neuropathy due to chemotherapeutic drug ([place]) 357.6 G62.0    E933.1 T45.1X5A      Oncology History    carcinoma breast (right) inner and upper quadrant T1 cN0 M0 tumor Estrogen receptor positive progesterone receptor positive HER-2 receptor negative by fish Mammo print shows high risk (diagnosis in January of 2016) patient has 22% average risk in 5 years and 29% average risk in 10 years for distant metastectomy disease without chemotherapy on anti-hormonal treatment 2.  Patient started Cytoxan and Adriamycin on now August 30, 2015 MUGA scan shows ejection fraction of baseline 55%.  3.Patient has finished total of 4 cycles of chemotherapy on November 01, 2015 4.  Started on the Taxol from November 22, 2015     Breast cancer of upper-inner quadrant of right female breast ([place])   01/31/2016 Initial Diagnosis Breast cancer of upper-inner quadrant of right female breast [facility])     INTERVAL HISTORY: 70 year old lady History of  stage I right-sided breast cancer currently on adjuvant chemotherapy with Taxol is here for follow-up. Patient currently status post 10 treatments Taxol   patient noticed significant worsening of the tingling and numbness in her feet more than hands.  The burning pain keeps her up/ also with her up at night.  Still able to  [facility] her shirt/ no stumbling noted.  However neuropathy is bothersome.  Patient denies any fevers or chills. Denies any nausea vomiting or constipation.   REVIEW OF SYSTEMS:  A complete 10 point review of system is done which is negative for mentioned above in history of present illness.  PAST MEDICAL HISTORY: Past Medical History  Diagnosis Date  . Hypertension   . Hyperlipidemia   . Cancer ([place])   . Visual blurriness     SOMETHING NEW THAT HAS DEVELOPED SINCE 07-13-15 SURGERY- SEEN AT [facility] ENT AND EXAM WAS NORMAL PER PT (08-09-15)    PAST SURGICAL HISTORY: Past Surgical History  Procedure Laterality Date  . Abdominal hysterectomy  1987  . Fatty tumor Left 2013    side  . Bunionectomy Right 1993  . Breast biopsy Right     neg  . Breast biopsy Left     4 oclock, FIBROEPITHELIAL LESION FAVOR CELLULAR FIBROADENOMA  . Breast biopsy Left     2 oclock, CYSTIC APOCRINE METAPLASIA  . Breast biopsy Right 07/13/15    130 INVASIVE MAMMARY CARCINOMA, NO SPECIAL TYPE  . Breast lumpectomy with sentinel lymph node biopsy Right 07/13/2015    Procedure: BREAST LUMPECTOMY WITH SENTINEL LYMPH NODE BX;  Surgeon: Christene Lye, MD;  Location: [facility] ORS;  Service: General;  Laterality: Right;  . Breast lumpectomy Left 07/13/2015    Procedure: BREAST LUMPECTOMY;  Surgeon: Christene Lye, MD;  Location: [facility] ORS;  Service: General;  Laterality: Left;  . Portacath placement Left 08/16/2015    Procedure: INSERTION PORT-A-CATH;  Surgeon: Christene Lye, MD;  Location: [facility] ORS;  Service: General;  Laterality: Left;    FAMILY HISTORY Family History  Problem  Relation Age of Onset  . Cancer Sister 1    breast cancer  . Breast cancer Sister 22  . Colon cancer Neg Hx         ADVANCED DIRECTIVES:   Patient does not have any living will or healthcare power of attorney.  Information was given .  Available resources had been discussed.  We will follow-up on subsequent appointments regarding this issue HEALTH MAINTENANCE: Social History  Substance Use Topics  . Smoking status: Never Smoker   . Smokeless tobacco: Never Used  . Alcohol Use: No      :  Allergies  Allergen Reactions  . Lovastatin Anaphylaxis    Tongue swelling    Current Outpatient Prescriptions  Medication Sig Dispense Refill  . amLODipine (NORVASC) 5 MG tablet TAKE ONE TABLET BY MOUTH ONCE DAILY 90 tablet 1  . cholecalciferol (VITAMIN D) 1000 units tablet Take 1,000 Units by mouth daily.    . hydrochlorothiazide (HYDRODIURIL) 25 MG tablet TAKE ONE TABLET BY MOUTH ONCE DAILY 90 tablet 0  . HYDROcodone-acetaminophen (NORCO/VICODIN) 5-325 MG tablet Take 1 tablet by mouth every 6 (six) hours as needed for moderate pain. 10 tablet 0  . lidocaine-prilocaine (EMLA) cream Apply to port site 1-2 hours prior to port being accessed.  Cover with plastic wrap. 1 each 3  . ondansetron (ZOFRAN) 4 MG tablet Take 1 tablet (4 mg total) by mouth every 6 (six) hours as needed. 30 tablet 3  . Potassium Chloride ER 20 MEQ TBCR Take 1 tablet by mouth daily. 30 tablet 3  . pravastatin (PRAVACHOL) 10 MG tablet TAKE ONE TABLET BY MOUTH ONCE DAILY 90 tablet 1  . gabapentin (NEURONTIN) 300 MG capsule Take 1 capsule (300 mg total) by mouth 3 (three) times daily. 60 capsule 3   No current facility-administered medications for this visit.    OBJECTIVE: PHYSICAL EXAM: GENERAL:  Well developed, well nourished, sitting comfortably in the exam room in no acute distress. MENTAL STATUS:  Alert and oriented to person, place and time.  ENT:  Oropharynx clear without lesion.  Tongue normal. Mucous  membranes moist. Alopecia.  Examination of the nose shows redness.  No active bleeding. RESPIRATORY:  Clear to auscultation without rales, wheezes or rhonchi. Patient has a port placed on the left upper chest wall area.  No evidence of redness. CARDIOVASCULAR:  Regular rate and rhythm without murmur, rub or gallop.  ABDOMEN:  Soft, non-tender, with active bowel sounds, and no hepatosplenomegaly.  No masses. BACK:  No CVA tenderness.  No tenderness on percussion of the back or rib cage. SKIN:  No rashes, ulcers or lesions. EXTREMITIES: No edema, no skin discoloration or tenderness.  No palpable cords. LYMPH NODES: No palpable cervical, supraclavicular, axillary or inguinal adenopathy  NEUROLOGICAL: Unremarkable. PSYCH:  Appropriate.  Filed Vitals:   01/31/16 1126  BP: 105/73  Pulse: 99  Temp: 97.1 F (36.2 C)  Resp: 18     Body mass index is 21.99 kg/(m^2).    ECOG FS:0 - Asymptomatic  LAB RESULTS:  Appointment on 01/31/2016  Component Date Value Ref Range Status  . WBC 01/31/2016 2.2* 3.6 - 11.0 K/uL Final  . RBC 01/31/2016 3.40* 3.80 - 5.20 MIL/uL Final  . Hemoglobin 01/31/2016 10.5* 12.0 - 16.0 g/dL Final  . HCT 01/31/2016 30.6* 35.0 - 47.0 % Final  . MCV 01/31/2016 89.8  80.0 - 100.0 fL Final  . MCH 01/31/2016 31.0  26.0 - 34.0 pg Final  . MCHC 01/31/2016 34.5  32.0 - 36.0 g/dL Final  . RDW 01/31/2016 15.1* 11.5 - 14.5 % Final  . Platelets 01/31/2016 376  150 - 440 K/uL Final  . Neutrophils Relative % 01/31/2016 66   Final  . Neutro Abs 01/31/2016 1.5  1.4 - 6.5 K/uL Final  . Lymphocytes Relative 01/31/2016 19   Final  . Lymphs Abs 01/31/2016 0.4* 1.0 - 3.6 K/uL Final  . Monocytes Relative 01/31/2016 11   Final  . Monocytes Absolute 01/31/2016 0.3  0.2 - 0.9 K/uL Final  . Eosinophils Relative 01/31/2016 2   Final  . Eosinophils Absolute 01/31/2016 0.0  0 - 0.7 K/uL Final  . Basophils Relative 01/31/2016 2   Final  . Basophils Absolute 01/31/2016 0.0  0 - 0.1 K/uL Final   . Sodium 01/31/2016 134* 135 - 145 mmol/L Final  . Potassium 01/31/2016 3.0* 3.5 - 5.1 mmol/L Final  . Chloride 01/31/2016 100* 101 - 111 mmol/L Final  . CO2 01/31/2016 24  22 - 32 mmol/L Final  . Glucose, Bld 01/31/2016 113* 65 - 99 mg/dL Final  . BUN 01/31/2016 6  6 - 20 mg/dL Final  . Creatinine, Ser 01/31/2016 0.63  0.44 - 1.00 mg/dL Final  . Calcium 01/31/2016 9.2  8.9 - 10.3 mg/dL Final  . Total Protein 01/31/2016 6.6  6.5 - 8.1 g/dL Final  . Albumin 01/31/2016 4.3  3.5 - 5.0 g/dL Final  . AST 01/31/2016 24  15 - 41 U/L Final  . ALT 01/31/2016 20  14 - 54 U/L Final  . Alkaline Phosphatase 01/31/2016 41  38 - 126 U/L Final  . Total Bilirubin 01/31/2016 0.3  0.3 - 1.2 mg/dL Final  . GFR calc non Af Amer 01/31/2016 >60  >60 mL/min Final  . GFR calc Af Amer 01/31/2016 >60  >60 mL/min Final   Comment: (NOTE) The eGFR has been calculated using the CKD EPI equation. This calculation has not been validated in all clinical situations. eGFR's persistently <60 mL/min signify possible Chronic Kidney Disease.   . Anion gap 01/31/2016 10  5 - 15 Final     STUDIES: Mm Diag Breast Tomo Uni Right  01/02/2016  CLINICAL DATA:  70 year old female presenting for six-month follow-up status post right breast lumpectomy in December of 2016. EXAM: 2D DIGITAL DIAGNOSTIC UNILATERAL RIGHT MAMMOGRAM WITH CAD AND ADJUNCT TOMO COMPARISON:  Previous exam(s). ACR Breast Density Category b: There are scattered areas of fibroglandular density. FINDINGS: Expected postsurgical changes noted in the medial right breast status post lumpectomy. No new suspicious calcifications, masses or areas of distortion are seen in the right breast. Mammographic images were processed with CAD. IMPRESSION: 1. Expected post surgical changes in the medial right breast status post right lumpectomy. 2.  No evidence of malignancy in the right breast. RECOMMENDATION: Bilateral diagnostic mammogram recommended in November of 2017 for routine  annual postlumpectomy evaluation. I have discussed the findings and recommendations with the patient. Results were also provided in writing at the conclusion of the visit. If applicable, a reminder letter will be sent to the patient regarding the  next appointment. BI-RADS CATEGORY  2: Benign. Electronically Signed   By: Ammie Ferrier M.D.   On: 01/02/2016 14:00    ASSESSMENT:   Breast cancer, right (HCC) Stage I ER/PR positive HER-2/neu negative. Currently on adjuvant chemotherapy with Taxol weekly. Patient tolerating chemotherapy well except for mod-severe side effects noted below.  Patient is currently status post 10 treatments of Taxol.   # PN-2- hold chemotoday; re-eval in 2 weeks/ for weekly taxol.   #  Hypokalemia- continue potassium supplementation  #   Patient will start radiation postchemotherapy. She'll be a candidate for adjuvant aromatase inhibitor.    Breast cancer of upper-inner quadrant of right female breast ([place]) Stage I ER/PR positive HER-2/neu negative. Currently on adjuvant chemotherapy with Taxol weekly. Patient tolerating chemotherapy well except for mod-severe side effects noted below.  Patient is currently status post 10 treatments of Taxol.   # PN-2- hold chemotoday; re-eval in 2 weeks/ for weekly taxol.  If patient continues to have significant neuropathy we will discontinue Taxol.  Also start the patient on Neurontin 300 mg twice a day  #  Hypokalemia- continue potassium supplementation  # Patient will start radiation postchemotherapy. She'll be a candidate for adjuvant aromatase inhibitor.  #  Follow up in 2 weeks/ labs Taxol-. Neuropathy improved   No matching staging information was found for the patient.  Cammie Sickle, MD   01/31/2016 4:47 PM

## 2016-01-31 NOTE — Progress Notes (Signed)
No chemotherapy today. Port flushed in exam room.

## 2016-02-07 ENCOUNTER — Telehealth: Payer: Self-pay

## 2016-02-07 ENCOUNTER — Other Ambulatory Visit: Payer: Self-pay | Admitting: *Deleted

## 2016-02-07 ENCOUNTER — Other Ambulatory Visit: Payer: Self-pay | Admitting: Internal Medicine

## 2016-02-07 DIAGNOSIS — C50011 Malignant neoplasm of nipple and areola, right female breast: Secondary | ICD-10-CM

## 2016-02-07 DIAGNOSIS — R600 Localized edema: Secondary | ICD-10-CM

## 2016-02-07 MED ORDER — FUROSEMIDE 20 MG PO TABS: 20.0000 mg | ORAL_TABLET | Freq: Two times a day (BID) | ORAL | Status: DC

## 2016-02-07 MED ORDER — POTASSIUM CHLORIDE ER 20 MEQ PO TBCR: 1.0000 | EXTENDED_RELEASE_TABLET | Freq: Two times a day (BID) | ORAL | Status: DC

## 2016-02-07 NOTE — Telephone Encounter (Signed)
Bilateral lower extremity swelling, worsening in feet.  No redness or warm to touch.  Pt denies any numbness/tingling in toes.  Able to wear her shoes and ambulate.

## 2016-02-08 ENCOUNTER — Telehealth: Payer: Self-pay

## 2016-02-08 NOTE — Telephone Encounter (Signed)
Patient stated that she is having increasing neuropathy in her lower extremities. She said it was uncomfortable and was impeding her ability to walk.  She started taking lasix a few days ago to address her edema.  Is concerned that this numbness is related to her lasix.  She has Dr. appointment next Thursday but would like to be seen sooner.

## 2016-02-09 ENCOUNTER — Telehealth: Payer: Self-pay

## 2016-02-09 ENCOUNTER — Inpatient Hospital Stay: Payer: Medicare Other | Attending: Family Medicine | Admitting: Family Medicine

## 2016-02-09 ENCOUNTER — Other Ambulatory Visit: Payer: Self-pay | Admitting: Family Medicine

## 2016-02-09 VITALS — BP 112/78 | HR 94 | Temp 96.9°F | Resp 18 | Wt 124.1 lb

## 2016-02-09 DIAGNOSIS — T451X5A Adverse effect of antineoplastic and immunosuppressive drugs, initial encounter: Secondary | ICD-10-CM

## 2016-02-09 DIAGNOSIS — E785 Hyperlipidemia, unspecified: Secondary | ICD-10-CM | POA: Insufficient documentation

## 2016-02-09 DIAGNOSIS — T451X5S Adverse effect of antineoplastic and immunosuppressive drugs, sequela: Secondary | ICD-10-CM | POA: Insufficient documentation

## 2016-02-09 DIAGNOSIS — G620 Drug-induced polyneuropathy: Secondary | ICD-10-CM | POA: Diagnosis not present

## 2016-02-09 DIAGNOSIS — I10 Essential (primary) hypertension: Secondary | ICD-10-CM | POA: Diagnosis not present

## 2016-02-09 DIAGNOSIS — Z9221 Personal history of antineoplastic chemotherapy: Secondary | ICD-10-CM | POA: Insufficient documentation

## 2016-02-09 DIAGNOSIS — C50211 Malignant neoplasm of upper-inner quadrant of right female breast: Secondary | ICD-10-CM | POA: Insufficient documentation

## 2016-02-09 DIAGNOSIS — Z170 Estrogen receptor positive status [ER+]: Secondary | ICD-10-CM | POA: Diagnosis not present

## 2016-02-09 DIAGNOSIS — R262 Difficulty in walking, not elsewhere classified: Secondary | ICD-10-CM | POA: Insufficient documentation

## 2016-02-09 DIAGNOSIS — Z79899 Other long term (current) drug therapy: Secondary | ICD-10-CM | POA: Diagnosis not present

## 2016-02-09 DIAGNOSIS — G4700 Insomnia, unspecified: Secondary | ICD-10-CM | POA: Insufficient documentation

## 2016-02-09 MED ORDER — GABAPENTIN 300 MG PO CAPS: ORAL_CAPSULE | ORAL | Status: DC

## 2016-02-09 NOTE — Progress Notes (Signed)
Patient here today as acute add on for neuropathy in her feet.  States she had edema in her feet and was placed on Lasix for 4 days.  Since she has been on Lasix she had noticed the neuropathy has worsened.

## 2016-02-09 NOTE — Telephone Encounter (Signed)
Patient called regarding increased neuropathy in bilateral toes/feet affecting her ambulation.  Dr. B notified.  Patient added to NP schedule for Friday July 7 at 9 am.  Patient made aware.

## 2016-02-09 NOTE — Telephone Encounter (Addendum)
Pt instructed by Erline Levine, RN to come at [facility] on 02/09/16 to be evaluated by NP.

## 2016-02-09 NOTE — Progress Notes (Signed)
[place] @ [facility] Telephone:(336) [phone]  Fax:(336) [phone]   INITIAL CONSULT  Karma Ansley OB: [birth year]  MR#: [id number]  RXV#:[id number]  Patient Care Team: Lucille Passy, MD as PCP - General (Family Medicine) Glennie Isle, PA-C (Physician Assistant) Jerene Bears, MD as Consulting Physician (Gastroenterology) Lucille Passy, MD as Consulting Physician (Family Medicine) Seeplaputhur Robinette Haines, MD (General Surgery)  CHIEF COMPLAINT:  Chief Complaint  Patient presents with  . Acute Visit   VISIT DIAGNOSIS:     ICD-9-CM ICD-10-CM   1. Peripheral neuropathy due to chemotherapy (HCC) 357.7 G62.0 gabapentin (NEURONTIN) 300 MG capsule   E933.1 T45.1X5A      Oncology History    carcinoma breast (right) inner and upper quadrant T1 cN0 M0 tumor Estrogen receptor positive progesterone receptor positive HER-2 receptor negative by fish Mammo print shows high risk (diagnosis in January of 2016) patient has 22% average risk in 5 years and 29% average risk in 10 years for distant metastectomy disease without chemotherapy on anti-hormonal treatment 2.  Patient started Cytoxan and Adriamycin on now August 30, 2015 MUGA scan shows ejection fraction of baseline 55%.  3.Patient has finished total of 4 cycles of chemotherapy on November 01, 2015 4.  Started on the Taxol from November 22, 2015     Breast cancer of upper-inner quadrant of right female breast ([place])   01/31/2016 Initial Diagnosis Breast cancer of upper-inner quadrant of right female breast [facility])     INTERVAL HISTORY: 70 year old lady History of stage I right-sided breast cancer currently on adjuvant chemotherapy with Taxol is here for follow-up. Patient currently status post 10 treatments Taxol  Patient states that the swelling she previously had in the lower extremities has improved with the use of furosemide however, she feels that the neuropathy and numbness and tingling is worsened with the decrease in swelling.  She continues to take 300 mg of gabapentin 3 times per day and states that she is seeing no improvement in symptoms. Per Dr. Rogue Bussing, off further treatments have been discontinued due to progressive neuropathy. Patient reports that she can still use her fingers for fine motor skills.  Patient denies any fevers or chills. Denies any nausea vomiting or constipation.   REVIEW OF SYSTEMS:  A complete 10 point review of system is done which is negative for mentioned above in history of present illness.  PAST MEDICAL HISTORY: Past Medical History  Diagnosis Date  . Hypertension   . Hyperlipidemia   . Cancer ([place])   . Visual blurriness     SOMETHING NEW THAT HAS DEVELOPED SINCE 07-13-15 SURGERY- SEEN AT [facility] ENT AND EXAM WAS NORMAL PER PT (08-09-15)    PAST SURGICAL HISTORY: Past Surgical History  Procedure Laterality Date  . Abdominal hysterectomy  1987  . Fatty tumor Left 2013    side  . Bunionectomy Right 1993  . Breast biopsy Right     neg  . Breast biopsy Left     4 oclock, FIBROEPITHELIAL LESION FAVOR CELLULAR FIBROADENOMA  . Breast biopsy Left     2 oclock, CYSTIC APOCRINE METAPLASIA  . Breast biopsy Right 07/13/15    130 INVASIVE MAMMARY CARCINOMA, NO SPECIAL TYPE  . Breast lumpectomy with sentinel lymph node biopsy Right 07/13/2015    Procedure: BREAST LUMPECTOMY WITH SENTINEL LYMPH NODE BX;  Surgeon: Christene Lye, MD;  Location: [facility] ORS;  Service: General;  Laterality: Right;  . Breast lumpectomy Left 07/13/2015    Procedure: BREAST LUMPECTOMY;  Surgeon:  Seeplaputhur Robinette Haines, MD;  Location: [facility] ORS;  Service: General;  Laterality: Left;  . Portacath placement Left 08/16/2015    Procedure: INSERTION PORT-A-CATH;  Surgeon: Christene Lye, MD;  Location: [facility] ORS;  Service: General;  Laterality: Left;    FAMILY HISTORY Family History  Problem Relation Age of Onset  . Cancer Sister 54    breast cancer  . Breast cancer Sister 55  . Colon cancer Neg Hx          ADVANCED DIRECTIVES:  Patient does not have any living will or healthcare power of attorney.  Information was given .  Available resources had been discussed.  We will follow-up on subsequent appointments regarding this issue HEALTH MAINTENANCE: Social History  Substance Use Topics  . Smoking status: Never Smoker   . Smokeless tobacco: Never Used  . Alcohol Use: No      :  Allergies  Allergen Reactions  . Lovastatin Anaphylaxis    Tongue swelling    Current Outpatient Prescriptions  Medication Sig Dispense Refill  . amLODipine (NORVASC) 5 MG tablet TAKE ONE TABLET BY MOUTH ONCE DAILY 90 tablet 1  . cholecalciferol (VITAMIN D) 1000 units tablet Take 1,000 Units by mouth daily.    . furosemide (LASIX) 20 MG tablet Take 1 tablet (20 mg total) by mouth 2 (two) times daily. 8 tablet 0  . gabapentin (NEURONTIN) 300 MG capsule Take 2 Caps in morning and evening and 1 capsule midday. 150 capsule 1  . hydrochlorothiazide (HYDRODIURIL) 25 MG tablet TAKE ONE TABLET BY MOUTH ONCE DAILY 90 tablet 0  . HYDROcodone-acetaminophen (NORCO/VICODIN) 5-325 MG tablet Take 1 tablet by mouth every 6 (six) hours as needed for moderate pain. 10 tablet 0  . lidocaine-prilocaine (EMLA) cream Apply to port site 1-2 hours prior to port being accessed.  Cover with plastic wrap. 1 each 3  . ondansetron (ZOFRAN) 4 MG tablet Take 1 tablet (4 mg total) by mouth every 6 (six) hours as needed. 30 tablet 3  . Potassium Chloride ER 20 MEQ TBCR Take 1 tablet by mouth 2 (two) times daily. X 4 days. 8 tablet 0  . pravastatin (PRAVACHOL) 10 MG tablet TAKE ONE TABLET BY MOUTH ONCE DAILY 90 tablet 1   No current facility-administered medications for this visit.    OBJECTIVE: PHYSICAL EXAM: GENERAL:  Well developed, well nourished, sitting comfortably in the exam room in no acute distress. MENTAL STATUS:  Alert and oriented to person, place and time. RESPIRATORY:  Clear to auscultation without rales, wheezes or  rhonchi. CARDIOVASCULAR:  Regular rate and rhythm without murmur, rub or gallop. ABDOMEN:  Soft, non-tender, with active bowel sounds, and no hepatosplenomegaly.  No masses. SKIN:  No rashes, ulcers or lesions. EXTREMITIES: No edema, no skin discoloration or tenderness.  No palpable cords. NEUROLOGICAL: Unremarkable. Grade 3 peripheral neuropathy PSYCH:  Appropriate.  Filed Vitals:   02/09/16 0911  BP: 112/78  Pulse: 94  Temp: 96.9 F (36.1 C)  Resp: 18     Body mass index is 21.99 kg/(m^2).    ECOG FS:1 - Symptomatic but completely ambulatory  LAB RESULTS:  No visits with results within 5 Day(s) from this visit. Latest known visit with results is:  Appointment on 01/31/2016  Component Date Value Ref Range Status  . WBC 01/31/2016 2.2* 3.6 - 11.0 K/uL Final  . RBC 01/31/2016 3.40* 3.80 - 5.20 MIL/uL Final  . Hemoglobin 01/31/2016 10.5* 12.0 - 16.0 g/dL Final  . HCT 01/31/2016 30.6* 35.0 -  47.0 % Final  . MCV 01/31/2016 89.8  80.0 - 100.0 fL Final  . MCH 01/31/2016 31.0  26.0 - 34.0 pg Final  . MCHC 01/31/2016 34.5  32.0 - 36.0 g/dL Final  . RDW 01/31/2016 15.1* 11.5 - 14.5 % Final  . Platelets 01/31/2016 376  150 - 440 K/uL Final  . Neutrophils Relative % 01/31/2016 66   Final  . Neutro Abs 01/31/2016 1.5  1.4 - 6.5 K/uL Final  . Lymphocytes Relative 01/31/2016 19   Final  . Lymphs Abs 01/31/2016 0.4* 1.0 - 3.6 K/uL Final  . Monocytes Relative 01/31/2016 11   Final  . Monocytes Absolute 01/31/2016 0.3  0.2 - 0.9 K/uL Final  . Eosinophils Relative 01/31/2016 2   Final  . Eosinophils Absolute 01/31/2016 0.0  0 - 0.7 K/uL Final  . Basophils Relative 01/31/2016 2   Final  . Basophils Absolute 01/31/2016 0.0  0 - 0.1 K/uL Final  . Sodium 01/31/2016 134* 135 - 145 mmol/L Final  . Potassium 01/31/2016 3.0* 3.5 - 5.1 mmol/L Final  . Chloride 01/31/2016 100* 101 - 111 mmol/L Final  . CO2 01/31/2016 24  22 - 32 mmol/L Final  . Glucose, Bld 01/31/2016 113* 65 - 99 mg/dL Final  .  BUN 01/31/2016 6  6 - 20 mg/dL Final  . Creatinine, Ser 01/31/2016 0.63  0.44 - 1.00 mg/dL Final  . Calcium 01/31/2016 9.2  8.9 - 10.3 mg/dL Final  . Total Protein 01/31/2016 6.6  6.5 - 8.1 g/dL Final  . Albumin 01/31/2016 4.3  3.5 - 5.0 g/dL Final  . AST 01/31/2016 24  15 - 41 U/L Final  . ALT 01/31/2016 20  14 - 54 U/L Final  . Alkaline Phosphatase 01/31/2016 41  38 - 126 U/L Final  . Total Bilirubin 01/31/2016 0.3  0.3 - 1.2 mg/dL Final  . GFR calc non Af Amer 01/31/2016 >60  >60 mL/min Final  . GFR calc Af Amer 01/31/2016 >60  >60 mL/min Final   Comment: (NOTE) The eGFR has been calculated using the CKD EPI equation. This calculation has not been validated in all clinical situations. eGFR's persistently <60 mL/min signify possible Chronic Kidney Disease.   . Anion gap 01/31/2016 10  5 - 15 Final     STUDIES: No results found.  ASSESSMENT:  Carcinoma of right breast, stage I, ER/PR positive HER-2/neu negative. Patient has completed 10 treatments of Taxol. Due to progressive neuropathy all future treatments have been discontinued. Peripheral neuropathy is secondary to chemotherapy. Patient also had some lower extremity edema which has resolved with the use of furosemide but, patient feels that her numbness and tingling has been progressively worse. Patient is currently on Neurontin/gabapentin 300 mg 3 times per day. She states she has noticed no improvement with the addition of gabapentin.  Advised patient to increase gabapentin to 600 mg in the morning, 300 mg midday, 600 mg in the evening. Patient does have a follow-up appointment scheduled with Dr. Rogue Bussing in 1 week for continued discussion regarding adjuvant aromatase inhibitor therapy.  Patient expressed understanding and was in agreement with this plan. She also understands that she can call clinic at any time with any questions, concerns, or complaints.  Dr. Rogue Bussing was available for consultation and review of plan of  care for this patient.   Evlyn Kanner, NP   02/09/2016 9:39 AM

## 2016-02-10 ENCOUNTER — Other Ambulatory Visit: Payer: Self-pay | Admitting: Family Medicine

## 2016-02-13 ENCOUNTER — Other Ambulatory Visit: Payer: Self-pay | Admitting: Family Medicine

## 2016-02-13 NOTE — Telephone Encounter (Signed)
Spoke to pt who states she has located remaining meds and will not require a refill until Sep 2017

## 2016-02-15 ENCOUNTER — Inpatient Hospital Stay: Payer: Medicare Other

## 2016-02-15 ENCOUNTER — Ambulatory Visit: Payer: Medicare Other

## 2016-02-15 ENCOUNTER — Inpatient Hospital Stay (HOSPITAL_BASED_OUTPATIENT_CLINIC_OR_DEPARTMENT_OTHER): Payer: Medicare Other | Admitting: Internal Medicine

## 2016-02-15 VITALS — BP 143/84 | HR 84 | Temp 96.6°F | Resp 18 | Wt 126.1 lb

## 2016-02-15 DIAGNOSIS — E876 Hypokalemia: Secondary | ICD-10-CM

## 2016-02-15 DIAGNOSIS — Z170 Estrogen receptor positive status [ER+]: Secondary | ICD-10-CM

## 2016-02-15 DIAGNOSIS — R262 Difficulty in walking, not elsewhere classified: Secondary | ICD-10-CM

## 2016-02-15 DIAGNOSIS — T451X5S Adverse effect of antineoplastic and immunosuppressive drugs, sequela: Secondary | ICD-10-CM

## 2016-02-15 DIAGNOSIS — C50211 Malignant neoplasm of upper-inner quadrant of right female breast: Secondary | ICD-10-CM

## 2016-02-15 DIAGNOSIS — G4700 Insomnia, unspecified: Secondary | ICD-10-CM

## 2016-02-15 DIAGNOSIS — C50811 Malignant neoplasm of overlapping sites of right female breast: Secondary | ICD-10-CM

## 2016-02-15 DIAGNOSIS — Z79899 Other long term (current) drug therapy: Secondary | ICD-10-CM

## 2016-02-15 DIAGNOSIS — T451X5A Adverse effect of antineoplastic and immunosuppressive drugs, initial encounter: Secondary | ICD-10-CM

## 2016-02-15 DIAGNOSIS — Z9221 Personal history of antineoplastic chemotherapy: Secondary | ICD-10-CM

## 2016-02-15 DIAGNOSIS — G620 Drug-induced polyneuropathy: Secondary | ICD-10-CM

## 2016-02-15 DIAGNOSIS — I10 Essential (primary) hypertension: Secondary | ICD-10-CM

## 2016-02-15 DIAGNOSIS — E785 Hyperlipidemia, unspecified: Secondary | ICD-10-CM

## 2016-02-15 LAB — CBC WITH DIFFERENTIAL/PLATELET
BASOS ABS: 0.1 10*3/uL (ref 0–0.1)
BASOS PCT: 2 %
EOS ABS: 0.1 10*3/uL (ref 0–0.7)
EOS PCT: 4 %
HCT: 32.6 % — ABNORMAL LOW (ref 35.0–47.0)
Hemoglobin: 11.5 g/dL — ABNORMAL LOW (ref 12.0–16.0)
LYMPHS PCT: 20 %
Lymphs Abs: 0.6 10*3/uL — ABNORMAL LOW (ref 1.0–3.6)
MCH: 31.2 pg (ref 26.0–34.0)
MCHC: 35.2 g/dL (ref 32.0–36.0)
MCV: 88.6 fL (ref 80.0–100.0)
MONO ABS: 0.3 10*3/uL (ref 0.2–0.9)
Monocytes Relative: 10 %
Neutro Abs: 2 10*3/uL (ref 1.4–6.5)
Neutrophils Relative %: 64 %
PLATELETS: 329 10*3/uL (ref 150–440)
RBC: 3.68 MIL/uL — ABNORMAL LOW (ref 3.80–5.20)
RDW: 15.1 % — AB (ref 11.5–14.5)
WBC: 3.1 10*3/uL — AB (ref 3.6–11.0)

## 2016-02-15 LAB — COMPREHENSIVE METABOLIC PANEL
ALT: 18 U/L (ref 14–54)
AST: 24 U/L (ref 15–41)
Albumin: 4.4 g/dL (ref 3.5–5.0)
Alkaline Phosphatase: 40 U/L (ref 38–126)
Anion gap: 7 (ref 5–15)
BUN: 9 mg/dL (ref 6–20)
CHLORIDE: 103 mmol/L (ref 101–111)
CO2: 25 mmol/L (ref 22–32)
Calcium: 9.2 mg/dL (ref 8.9–10.3)
Creatinine, Ser: 0.67 mg/dL (ref 0.44–1.00)
GFR calc Af Amer: >60 mL/min (ref >60)
Glucose, Bld: 124 mg/dL — ABNORMAL HIGH (ref 65–99)
POTASSIUM: 3.4 mmol/L — AB (ref 3.5–5.1)
SODIUM: 135 mmol/L (ref 135–145)
Total Bilirubin: 0.8 mg/dL (ref 0.3–1.2)
Total Protein: 7 g/dL (ref 6.5–8.1)

## 2016-02-15 MED ORDER — DULOXETINE HCL 60 MG PO CPEP: 60.0000 mg | ORAL_CAPSULE | Freq: Every day | ORAL | Status: DC

## 2016-02-15 MED ORDER — HYDROCODONE-ACETAMINOPHEN 5-325 MG PO TABS: ORAL_TABLET | ORAL | Status: DC

## 2016-02-15 MED ORDER — HYDROCODONE-ACETAMINOPHEN 5-325 MG PO TABS: 1.0000 | ORAL_TABLET | Freq: Four times a day (QID) | ORAL | Status: DC | PRN

## 2016-02-15 NOTE — Patient Instructions (Signed)
Duloxetine delayed-release capsules  What is this medicine?  DULOXETINE (doo LOX e teen) is used to treat depression, anxiety, and different types of chronic pain.  This medicine may be used for other purposes; ask your health care provider or pharmacist if you have questions.  What should I tell my health care provider before I take this medicine?  They need to know if you have any of these conditions:  -bipolar disorder or a family history of bipolar disorder  -glaucoma  -kidney disease  -liver disease  -suicidal thoughts or a previous suicide attempt  -taken medicines called MAOIs like Carbex, Eldepryl, Marplan, Nardil, and Parnate within 14 days  -an unusual reaction to duloxetine, other medicines, foods, dyes, or preservatives  -pregnant or trying to get pregnant  -breast-feeding  How should I use this medicine?  Take this medicine by mouth with a glass of water. Follow the directions on the prescription label. Do not cut, crush or chew this medicine. You can take this medicine with or without food. Take your medicine at regular intervals. Do not take your medicine more often than directed. Do not stop taking this medicine suddenly except upon the advice of your doctor. Stopping this medicine too quickly may cause serious side effects or your condition may worsen.  A special MedGuide will be given to you by the pharmacist with each prescription and refill. Be sure to read this information carefully each time.  Talk to your pediatrician regarding the use of this medicine in children. While this drug may be prescribed for children as young as 7 years of age for selected conditions, precautions do apply.  Overdosage: If you think you have taken too much of this medicine contact a poison control center or emergency room at once.  NOTE: This medicine is only for you. Do not share this medicine with others.  What if I miss a dose?  If you miss a dose, take it as soon as you can. If it is almost time for your next  dose, take only that dose. Do not take double or extra doses.  What may interact with this medicine?  Do not take this medicine with any of the following medications:  -certain diet drugs like dexfenfluramine, fenfluramine  -desvenlafaxine  -linezolid  -MAOIs like Azilect, Carbex, Eldepryl, Marplan, Nardil, and Parnate  -methylene blue (intravenous)  -milnacipran  -thioridazine  -venlafaxine  This medicine may also interact with the following medications:  -alcohol  -aspirin and aspirin-like medicines  -certain antibiotics like ciprofloxacin and enoxacin  -certain medicines for blood pressure, heart disease, irregular heart beat  -certain medicines for depression, anxiety, or psychotic disturbances  -certain medicines for migraine headache like almotriptan, eletriptan, frovatriptan, naratriptan, rizatriptan, sumatriptan, zolmitriptan  -certain medicines that treat or prevent blood clots like warfarin, enoxaparin, and dalteparin  -cimetidine  -fentanyl  -lithium  -NSAIDS, medicines for pain and inflammation, like ibuprofen or naproxen  -phentermine  -procarbazine  -sibutramine  -St. John's wort  -theophylline  -tramadol  -tryptophan  This list may not describe all possible interactions. Give your health care provider a list of all the medicines, herbs, non-prescription drugs, or dietary supplements you use. Also tell them if you smoke, drink alcohol, or use illegal drugs. Some items may interact with your medicine.  What should I watch for while using this medicine?  Tell your doctor if your symptoms do not get better or if they get worse. Visit your doctor or health care professional for regular checks on your progress.   Because it may take several weeks to see the full effects of this medicine, it is important to continue your treatment as prescribed by your doctor.  Patients and their families should watch out for new or worsening thoughts of suicide or depression. Also watch out for sudden changes in feelings such  as feeling anxious, agitated, panicky, irritable, hostile, aggressive, impulsive, severely restless, overly excited and hyperactive, or not being able to sleep. If this happens, especially at the beginning of treatment or after a change in dose, call your health care professional.  You may get drowsy or dizzy. Do not drive, use machinery, or do anything that needs mental alertness until you know how this medicine affects you. Do not stand or sit up quickly, especially if you are an older patient. This reduces the risk of dizzy or fainting spells. Alcohol may interfere with the effect of this medicine. Avoid alcoholic drinks.  This medicine can cause an increase in blood pressure. This medicine can also cause a sudden drop in your blood pressure, which may make you feel faint and increase the chance of a fall. These effects are most common when you first start the medicine or when the dose is increased, or during use of other medicines that can cause a sudden drop in blood pressure. Check with your doctor for instructions on monitoring your blood pressure while taking this medicine.  Your mouth may get dry. Chewing sugarless gum or sucking hard candy, and drinking plenty of water may help. Contact your doctor if the problem does not go away or is severe.  What side effects may I notice from receiving this medicine?  Side effects that you should report to your doctor or health care professional as soon as possible:  -allergic reactions like skin rash, itching or hives, swelling of the face, lips, or tongue  -changes in blood pressure  -confusion  -dark urine  -dizziness  -fast talking and excited feelings or actions that are out of control  -fast, irregular heartbeat  -fever  -general ill feeling or flu-like symptoms  -hallucination, loss of contact with reality  -light-colored stools  -loss of balance or coordination  -redness, blistering, peeling or loosening of the skin, including inside the mouth  -right upper  belly pain  -seizures  -suicidal thoughts or other mood changes  -trouble concentrating  -trouble passing urine or change in the amount of urine  -unusual bleeding or bruising  -unusually weak or tired  -yellowing of the eyes or skin  Side effects that usually do not require medical attention (report to your doctor or health care professional if they continue or are bothersome):  -blurred vision  -change in appetite  -change in sex drive or performance  -headache  -increased sweating  -nausea  This list may not describe all possible side effects. Call your doctor for medical advice about side effects. You may report side effects to FDA at 1-800-FDA-1088.  Where should I keep my medicine?  Keep out of the reach of children.  Store at room temperature between 20 and 25 degrees C (68 to 77 degrees F). Throw away any unused medicine after the expiration date.  NOTE: This sheet is a summary. It may not cover all possible information. If you have questions about this medicine, talk to your doctor, pharmacist, or health care provider.     © 2016, Elsevier/Gold Standard. (2013-07-13 16:28:32)

## 2016-02-15 NOTE — Progress Notes (Signed)
Patient states she is still having problems with neuropathy in her hands and feet.  Noticed some in her legs yesterday.  States she had a bad night last night. Unable to sleep.  She is currently on 300 mg Neurotin 5X a day.  Also requesting refill for Norco and asking for [facility] parking sticker.

## 2016-02-15 NOTE — Assessment & Plan Note (Addendum)
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 Currently finished adjuvant chemotherapy. No clinical evidence of recurrence. Patient will start radiation postchemotherapy- referred to radiation oncology.. She'll be a candidate for adjuvant aromatase inhibitor.  # Grade 2 neuropathy- recommend continue Neurontin 600 twice a day; add Cymbalta. Also prescription for hydrocodone 1 every 8 hours.  # Follow-up with me 6 weeks port flush/no labs.   # Temporary parking pass- given difficulty walking. 

## 2016-02-15 NOTE — Progress Notes (Signed)
[place] @ [facility] Telephone:(336) [phone]  Fax:([phone]   INITIAL CONSULT  Sandra [name] OB: 09-18-1945  MR#: [id number]  ACZ#:[id number]  Patient Care Team: Lucille Passy, MD as PCP - General (Family Medicine) Glennie Isle, PA-C (Physician Assistant) Jerene Bears, MD as Consulting Physician (Gastroenterology) Lucille Passy, MD as Consulting Physician (Family Medicine) Seeplaputhur Robinette Haines, MD (General Surgery)  CHIEF COMPLAINT:  Chief Complaint  Patient presents with  . Breast Cancer   VISIT DIAGNOSIS:     ICD-9-CM ICD-10-CM   1. Breast cancer of upper-inner quadrant of right female breast (HCC) 174.2 C50.211 DULoxetine (CYMBALTA) 60 MG capsule     HYDROcodone-acetaminophen (NORCO/VICODIN) 5-325 MG tablet     CBC with Differential     Comprehensive metabolic panel     DISCONTINUED: HYDROcodone-acetaminophen (NORCO/VICODIN) 5-325 MG tablet  2. Neuropathy due to chemotherapeutic drug (HCC) 357.6 G62.0 DULoxetine (CYMBALTA) 60 MG capsule   E933.1 T45.1X5A HYDROcodone-acetaminophen (NORCO/VICODIN) 5-325 MG tablet     CBC with Differential     Comprehensive metabolic panel     Oncology History    carcinoma breast (right) inner and upper quadrant T1 cN0 M0 tumor Estrogen receptor positive progesterone receptor positive HER-2 receptor negative by fish Mammo print shows high risk (diagnosis in January of 2016) patient has 22% average risk in 5 years and 29% average risk in 10 years for distant metastectomy disease without chemotherapy on anti-hormonal treatment 2.  Patient started Cytoxan and Adriamycin on now August 30, 2015 MUGA scan shows ejection fraction of baseline 55%.  3.Patient has finished total of 4 cycles of chemotherapy on November 01, 2015 4.  Started on the Taxol from November 22, 2015     Breast cancer of upper-inner quadrant of right female breast ([place])   01/31/2016 Initial Diagnosis Breast cancer of upper-inner quadrant of right female breast  [facility])     INTERVAL HISTORY: 70 year old lady History of stage I right-sided breast cancers/p  adjuvant chemotherapy with Taxol; status post 10 treatments Taxol- Here for follow-up.  Patient continues to complain of tingling and numbness in her hands; but mostly in her feet with burning pain. The burning pain keeps her up/ also with her up at night.  Still able to  [facility] her shirt/ no stumbling noted.  She has been on Neurontin 300 mg 5 times a day not helping.  Patient denies any fevers or chills. Denies any nausea vomiting or constipation.   REVIEW OF SYSTEMS:  A complete 10 point review of system is done which is negative for mentioned above in history of present illness.  PAST MEDICAL HISTORY: Past Medical History  Diagnosis Date  . Hypertension   . Hyperlipidemia   . Cancer ([place])   . Visual blurriness     SOMETHING NEW THAT HAS DEVELOPED SINCE 07-13-15 SURGERY- SEEN AT [facility] ENT AND EXAM WAS NORMAL PER PT (08-09-15)    PAST SURGICAL HISTORY: Past Surgical History  Procedure Laterality Date  . Abdominal hysterectomy  1987  . Fatty tumor Left 2013    side  . Bunionectomy Right 1993  . Breast biopsy Right     neg  . Breast biopsy Left     4 oclock, FIBROEPITHELIAL LESION FAVOR CELLULAR FIBROADENOMA  . Breast biopsy Left     2 oclock, CYSTIC APOCRINE METAPLASIA  . Breast biopsy Right 07/13/15    130 INVASIVE MAMMARY CARCINOMA, NO SPECIAL TYPE  . Breast lumpectomy with sentinel lymph node biopsy Right 07/13/2015  Procedure: BREAST LUMPECTOMY WITH SENTINEL LYMPH NODE BX;  Surgeon: Christene Lye, MD;  Location: [facility] ORS;  Service: General;  Laterality: Right;  . Breast lumpectomy Left 07/13/2015    Procedure: BREAST LUMPECTOMY;  Surgeon: Christene Lye, MD;  Location: [facility] ORS;  Service: General;  Laterality: Left;  . Portacath placement Left 08/16/2015    Procedure: INSERTION PORT-A-CATH;  Surgeon: Christene Lye, MD;  Location: [facility] ORS;  Service:  General;  Laterality: Left;    FAMILY HISTORY Family History  Problem Relation Age of Onset  . Cancer Sister 32    breast cancer  . Breast cancer Sister 75  . Colon cancer Neg Hx         ADVANCED DIRECTIVES:   Patient does not have any living will or healthcare power of attorney.  Information was given .  Available resources had been discussed.  We will follow-up on subsequent appointments regarding this issue HEALTH MAINTENANCE: Social History  Substance Use Topics  . Smoking status: Never Smoker   . Smokeless tobacco: Never Used  . Alcohol Use: No      :  Allergies  Allergen Reactions  . Lovastatin Anaphylaxis    Tongue swelling    Current Outpatient Prescriptions  Medication Sig Dispense Refill  . amLODipine (NORVASC) 5 MG tablet TAKE ONE TABLET BY MOUTH ONCE DAILY 90 tablet 1  . cholecalciferol (VITAMIN D) 1000 units tablet Take 1,000 Units by mouth daily.    . furosemide (LASIX) 20 MG tablet Take 1 tablet (20 mg total) by mouth 2 (two) times daily. 8 tablet 0  . gabapentin (NEURONTIN) 300 MG capsule Take 2 Caps in morning and evening and 1 capsule midday. 150 capsule 1  . hydrochlorothiazide (HYDRODIURIL) 25 MG tablet TAKE ONE TABLET BY MOUTH ONCE DAILY 90 tablet 0  . HYDROcodone-acetaminophen (NORCO/VICODIN) 5-325 MG tablet 1 pill every 8 hours as needed for pain 40 tablet 0  . lidocaine-prilocaine (EMLA) cream Apply to port site 1-2 hours prior to port being accessed.  Cover with plastic wrap. 1 each 3  . ondansetron (ZOFRAN) 4 MG tablet Take 1 tablet (4 mg total) by mouth every 6 (six) hours as needed. 30 tablet 3  . Potassium Chloride ER 20 MEQ TBCR Take 1 tablet by mouth 2 (two) times daily. X 4 days. 8 tablet 0  . pravastatin (PRAVACHOL) 10 MG tablet TAKE ONE TABLET BY MOUTH ONCE DAILY 90 tablet 1  . DULoxetine (CYMBALTA) 60 MG capsule Take 1 capsule (60 mg total) by mouth daily. 30 capsule 3   No current facility-administered medications for this visit.     OBJECTIVE: PHYSICAL EXAM: GENERAL:  Well developed, well nourished, sitting comfortably in the exam room in no acute distress. MENTAL STATUS:  Alert and oriented to person, place and time.  ENT:  Oropharynx clear without lesion.  Tongue normal. Mucous membranes moist. Alopecia.  Examination of the nose shows redness.  No active bleeding. RESPIRATORY:  Clear to auscultation without rales, wheezes or rhonchi. Patient has a port placed on the left upper chest wall area.  No evidence of redness. CARDIOVASCULAR:  Regular rate and rhythm without murmur, rub or gallop.  ABDOMEN:  Soft, non-tender, with active bowel sounds, and no hepatosplenomegaly.  No masses. BACK:  No CVA tenderness.  No tenderness on percussion of the back or rib cage. SKIN:  No rashes, ulcers or lesions. EXTREMITIES: No edema, no skin discoloration or tenderness.  No palpable cords. LYMPH NODES: No palpable cervical, supraclavicular, axillary  or inguinal adenopathy  NEUROLOGICAL: Unremarkable. PSYCH:  Appropriate.  Filed Vitals:   02/15/16 0931  BP: 143/84  Pulse: 84  Temp: 96.6 F (35.9 C)  Resp: 18     Body mass index is 22.34 kg/(m^2).    ECOG FS:0 - Asymptomatic  LAB RESULTS:  Appointment on 02/15/2016  Component Date Value Ref Range Status  . WBC 02/15/2016 3.1* 3.6 - 11.0 K/uL Final  . RBC 02/15/2016 3.68* 3.80 - 5.20 MIL/uL Final  . Hemoglobin 02/15/2016 11.5* 12.0 - 16.0 g/dL Final  . HCT 02/15/2016 32.6* 35.0 - 47.0 % Final  . MCV 02/15/2016 88.6  80.0 - 100.0 fL Final  . MCH 02/15/2016 31.2  26.0 - 34.0 pg Final  . MCHC 02/15/2016 35.2  32.0 - 36.0 g/dL Final  . RDW 02/15/2016 15.1* 11.5 - 14.5 % Final  . Platelets 02/15/2016 329  150 - 440 K/uL Final  . Neutrophils Relative % 02/15/2016 64   Final  . Neutro Abs 02/15/2016 2.0  1.4 - 6.5 K/uL Final  . Lymphocytes Relative 02/15/2016 20   Final  . Lymphs Abs 02/15/2016 0.6* 1.0 - 3.6 K/uL Final  . Monocytes Relative 02/15/2016 10   Final  .  Monocytes Absolute 02/15/2016 0.3  0.2 - 0.9 K/uL Final  . Eosinophils Relative 02/15/2016 4   Final  . Eosinophils Absolute 02/15/2016 0.1  0 - 0.7 K/uL Final  . Basophils Relative 02/15/2016 2   Final  . Basophils Absolute 02/15/2016 0.1  0 - 0.1 K/uL Final  . Sodium 02/15/2016 135  135 - 145 mmol/L Final  . Potassium 02/15/2016 3.4* 3.5 - 5.1 mmol/L Final  . Chloride 02/15/2016 103  101 - 111 mmol/L Final  . CO2 02/15/2016 25  22 - 32 mmol/L Final  . Glucose, Bld 02/15/2016 124* 65 - 99 mg/dL Final  . BUN 02/15/2016 9  6 - 20 mg/dL Final  . Creatinine, Ser 02/15/2016 0.67  0.44 - 1.00 mg/dL Final  . Calcium 02/15/2016 9.2  8.9 - 10.3 mg/dL Final  . Total Protein 02/15/2016 7.0  6.5 - 8.1 g/dL Final  . Albumin 02/15/2016 4.4  3.5 - 5.0 g/dL Final  . AST 02/15/2016 24  15 - 41 U/L Final  . ALT 02/15/2016 18  14 - 54 U/L Final  . Alkaline Phosphatase 02/15/2016 40  38 - 126 U/L Final  . Total Bilirubin 02/15/2016 0.8  0.3 - 1.2 mg/dL Final  . GFR calc non Af Amer 02/15/2016 >60  >60 mL/min Final  . GFR calc Af Amer 02/15/2016 >60  >60 mL/min Final   Comment: (NOTE) The eGFR has been calculated using the CKD EPI equation. This calculation has not been validated in all clinical situations. eGFR's persistently <60 mL/min signify possible Chronic Kidney Disease.   . Anion gap 02/15/2016 7  5 - 15 Final     STUDIES: No results found.  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 Currently finished adjuvant chemotherapy. No clinical evidence of recurrence. Patient will start radiation postchemotherapy- referred to radiation oncology.. She'll be a candidate for adjuvant aromatase inhibitor.  # Grade 2 neuropathy- recommend continue Neurontin 600 twice a day; add Cymbalta. Also prescription for hydrocodone 1 every 8 hours.  # Follow-up with me 6 weeks port flush/no labs.   # Temporary parking pass- given  difficulty walking.   No matching staging information was found for the patient.  Cammie Sickle,  MD   02/15/2016 8:40 PM

## 2016-02-19 ENCOUNTER — Telehealth: Payer: Self-pay | Admitting: *Deleted

## 2016-02-19 NOTE — Telephone Encounter (Signed)
Took 2 doses of Duloxetine she has blurred vision, extreme fatigue, sleeping all day, On call MD told her she could just stop it since she only had 2 doses. Per Dr Rogue Bussing, tell her to try taking it at night. She has agreed to try taking at [facility] as suggested.

## 2016-02-21 ENCOUNTER — Emergency Department
Admission: EM | Admit: 2016-02-21 | Discharge: 2016-02-21 | Disposition: A | Discharge status: Home / Self Care | Payer: Medicare Other | Attending: Emergency Medicine | Admitting: Emergency Medicine

## 2016-02-21 ENCOUNTER — Encounter: Payer: Self-pay | Admitting: Emergency Medicine

## 2016-02-21 ENCOUNTER — Telehealth: Payer: Self-pay | Admitting: *Deleted

## 2016-02-21 ENCOUNTER — Emergency Department: Payer: Medicare Other

## 2016-02-21 DIAGNOSIS — I10 Essential (primary) hypertension: Secondary | ICD-10-CM | POA: Insufficient documentation

## 2016-02-21 DIAGNOSIS — E785 Hyperlipidemia, unspecified: Secondary | ICD-10-CM | POA: Insufficient documentation

## 2016-02-21 DIAGNOSIS — R51 Headache: Secondary | ICD-10-CM | POA: Insufficient documentation

## 2016-02-21 DIAGNOSIS — Z79899 Other long term (current) drug therapy: Secondary | ICD-10-CM | POA: Insufficient documentation

## 2016-02-21 DIAGNOSIS — H409 Unspecified glaucoma: Secondary | ICD-10-CM | POA: Diagnosis not present

## 2016-02-21 DIAGNOSIS — H538 Other visual disturbances: Secondary | ICD-10-CM | POA: Diagnosis present

## 2016-02-21 DIAGNOSIS — Z859 Personal history of malignant neoplasm, unspecified: Secondary | ICD-10-CM | POA: Diagnosis not present

## 2016-02-21 MED ORDER — TETRACAINE HCL 0.5 % OP SOLN
OPHTHALMIC | Status: AC
  Administered 2016-02-21: 22:00:00
  Filled 2016-02-21: qty 2

## 2016-02-21 NOTE — Telephone Encounter (Signed)
Patient called to report blurry vision and headaches since beginning new medication, looks like patient was recently started on Cymbalta. Please advise.

## 2016-02-21 NOTE — ED Notes (Signed)
Pt. States she is a breast CA pt. Who last got chemo in June.  Pt. States problems with neuropathy in the last month.  Pt. States she was started on gabapentin 3 weeks ago with little relief,  Pt. States starting Cymbalta last Thursday.  Pt. Took it for one day and stated it gave her blurry vision so stopped taking.  Pt. Talked to her PCP and PCP had her restart medication on this past Monday night.  Pt. States slight HA and blurred vision on Tuesday that resolved on its own.  Pt. Took Cymbalta on Tuesday night and had worse blurred vision and HA today.  Pt. States only relief was with cold pack to forehead.

## 2016-02-21 NOTE — Discharge Instructions (Signed)
Please seek medical attention for any high fevers, chest pain, shortness of breath, change in behavior, persistent vomiting, bloody stool or any other new or concerning symptoms.   Glaucoma Glaucoma is a condition that is caused by high pressure inside the eye. The pressure in the eye is raised because fluid (aqueous humor) within the eye cannot get out through the normal drainage system. The increased pressure can cause damage to the nerves of the eye, and that can result in loss of vision. It is important to diagnose glaucoma early before damage occurs. Early treatment can often prevent vision loss. There are two main types of glaucoma:  Open-angle glaucoma. This is a chronic condition that causes the pressure inside the eye to rise slowly. This is the most common type of glaucoma, and it often causes no symptoms until later in the disease.  Acute angle-closure glaucoma. With this type, the pressure inside the eye rises suddenly to a very high level. This causes severe pain and requires treatment right away. CAUSES In many cases, the cause of this condition is not known. Glaucoma can sometimes result from other diseases, such as infection, cataracts, or tumors.  RISK FACTORS This condition is more likely to develop in people who:  Are older than 71 years of age.  Are of African-American descent.  Have high blood pressure or diabetes.  Have a family history of glaucoma.  Have a previous eye injury or eye surgery.  Are farsighted.  Use certain medicines that increase pressure in the eyes or cause the pupils to widen (dilate). SYMPTOMS In many cases, open-angle glaucoma causes no symptoms early on. If it is not treated, the condition will get worse and may cause a loss of side vision (peripheral vision). This can advance to tunnel vision, which means that you are able to see straight ahead but you have a loss of peripheral vision in all directions. Eventually, total loss of vision can  occur. Symptoms of acute angle-closure glaucoma develop suddenly and may include:  Clouded vision.  Severe pain in your affected eye.  Severe headache in the area around your eye.  Feeling nauseous.  Vomiting. DIAGNOSIS Glaucoma is diagnosed through an eye exam by an eye specialist (ophthalmologist). This specialist will perform a test to measure the pressure in your eye (tonometry). Eye tests to check your vision-- especially your peripheral vision--will be done. The ophthalmologist will also use various instruments to look inside your eye and check for damage to the nerves. Because glaucoma often does not cause symptoms until late in the disease, it is often found during a routine eye exam. It is important to have your eyes checked regularly. TREATMENT Early treatment is important to prevent vision loss. Treatment will focus on lowering the pressure in your eyes. This usually involves using medicines in the form of eye drops. The type of medicine will depend on how bad the disease is and the degree of damage. Laser treatments or other types of surgery may be needed in some cases.  Acute angle-closure glaucoma requires emergency treatment to help prevent vision loss. This treatment may involve eye drops and medicines that are given in pill form or through an IV tube. Surgery is usually done on both eyes right away. HOME CARE INSTRUCTIONS  Take medicines only as directed by your health care provider. Use your eye drops exactly as instructed.It is likely that you will need to use this medicine for the rest of your life.  Get regular exercise, but talk with  your health care provider about which types of exercise are safe for you. You may need to avoid yoga or other types of exercise that may involve standing on your head.  Keep all follow-up visits as directed by your health care provider. This is important. SEEK MEDICAL CARE IF:  You have new or worsening symptoms. SEEK IMMEDIATE MEDICAL  CARE IF:  You have severe pain in your affected eye.  You have problems with your vision.  You have a bad headache in the area around your eye.  You develop nausea and vomiting.  You have the same or similar symptoms in your other eye.   This information is not intended to replace advice given to you by your health care provider. Make sure you discuss any questions you have with your health care provider.   Document Released: 07/22/2005 Document Revised: 08/12/2014 Document Reviewed: 05/03/2014 Elsevier Interactive Patient Education [employer].

## 2016-02-21 NOTE — ED Notes (Signed)
Pt. States she has been taking gabapentin for numbness to feet and hands for the past 3 weeks.  Pt. States gabapentin was not effective and was started on another medication last week thursday.  Pt. States it was causing blurred vision and stopped taking on Friday.  Pt. States she talked to PCP and the wanted her to start taking in the evening on Monday night.  Pt. States slight HA yesterday with blurred vision that cleared on its own.  Pt. States today increased HA and increased blurred vision. Pt. States she took new medication Monday and Tuesday night.

## 2016-02-21 NOTE — Consult Note (Signed)
  Subjective: HA nausea and blurred vision for one day. Started Cymbalta a few days ago.  Objective: Vital signs in last 24 hours: Temp:  [98 F (36.7 C)] 98 F (36.7 C) (07/19 1951) Pulse Rate:  [80] 80 (07/19 1951) Resp:  [18] 18 (07/19 1951) BP: (145)/(77) 145/77 mmHg (07/19 1951) SpO2:  [98 %] 98 % (07/19 1951) Weight:  [55.339 kg (122 lb)] 55.339 kg (122 lb) (07/19 2005)    VA:  Cont fingers 2 feet OD,   4 feet OS IOP  50OD     50OS MCE OU Pupils fixed mid-dilated OU Mild cataracts OU  No results for input(s): WBC, HGB, HCT, NA, K, CL, CO2, BUN, CREATININE, GLU in the last 72 hours.  Invalid input(s): PLATELETS  Studies/Results: Ct Head Wo Contrast  02/21/2016  CLINICAL DATA:  Increasing headache and blurred vision. Symptoms began after medication change. EXAM: CT HEAD WITHOUT CONTRAST TECHNIQUE: Contiguous axial images were obtained from the base of the skull through the vertex without intravenous contrast. COMPARISON:  None. FINDINGS: Mild white matter hypoattenuation is within normal limits for age. No acute infarct, hemorrhage, or mass lesion is present. The basal ganglia are intact. The ventricles are of normal size. No significant extraaxial fluid collection is present. The paranasal sinuses and mastoid air cells are clear. Calvarium is within normal limits. The globes and orbits are intact. IMPRESSION: Negative CT of the head. Electronically Signed   By: San Morelle M.D.   On: 02/21/2016 21:10    Medications:  Assessment/Plan: TO [facility] NOW for definitive treatment to include YAG laser PI OU. Discussed urgency and gravity of condition with py and husband.     Jerald Villalona LOUIS 7/19/201710:17 PM

## 2016-02-21 NOTE — ED Notes (Signed)
Pt. temporarily moved from Room #1 ED to Flex #40 to have eye exam.

## 2016-02-21 NOTE — Telephone Encounter (Signed)
Patient continues to have headaches and fatigue. She has only taking 2 doses of the cymbalta. Dr. Rogue Bussing advised pt to take the cymbalta every other day. Pt instructed to do so and I asked pt to call cancer center back if no improvement of symptoms in the next few days.

## 2016-02-21 NOTE — ED Notes (Signed)
Patient transported to CT 

## 2016-02-21 NOTE — ED Provider Notes (Signed)
[facility] Emergency Department Provider Note    ____________________________________________  Time seen: ~2045  I have reviewed the triage vital signs and the nursing notes.   HISTORY  Chief Complaint Blurred Vision   History limited by: Not Limited   HPI Sandra [name] is a 70 y.o. female who presents to the emergency department today because of concerns for blurred vision and headache. The patient states that the symptoms started this morning. She states that she thinks that it is related to her medication duloxetine. She's been on that for 2 days. She has had blurry vision in both eyes. The pain is worse around her left eye. She denies similar symptoms in the past.   Past Medical History  Diagnosis Date  . Hypertension   . Hyperlipidemia   . Cancer ([place])   . Visual blurriness     SOMETHING NEW THAT HAS DEVELOPED SINCE 07-13-15 SURGERY- SEEN AT [facility] ENT AND EXAM WAS NORMAL PER PT (08-09-15)    Patient Active Problem List   Diagnosis Date Noted  . Breast cancer of upper-inner quadrant of right female breast ([place]) 01/31/2016  . Hypokalemia 01/17/2016  . Neuropathy due to chemotherapeutic drug ([place]) 01/17/2016  . Blurred vision 08/02/2015  . Medicare annual wellness visit, subsequent 04/12/2015  . Encounter for routine gynecological examination 04/12/2015  . Melasma 08/10/2014  . Neutropenia ([place]) 08/10/2014  . Positive ANA (antinuclear antibody) 08/10/2014  . Chronic leukopenia 04/06/2014  . Hyperpigmentation 01/26/2013  . Hair loss 01/14/2011  . Hypertension   . Hyperlipidemia     Past Surgical History  Procedure Laterality Date  . Abdominal hysterectomy  1987  . Fatty tumor Left 2013    side  . Bunionectomy Right 1993  . Breast biopsy Right     neg  . Breast biopsy Left     4 oclock, FIBROEPITHELIAL LESION FAVOR CELLULAR FIBROADENOMA  . Breast biopsy Left     2 oclock, CYSTIC APOCRINE METAPLASIA  . Breast biopsy  Right 07/13/15    130 INVASIVE MAMMARY CARCINOMA, NO SPECIAL TYPE  . Breast lumpectomy with sentinel lymph node biopsy Right 07/13/2015    Procedure: BREAST LUMPECTOMY WITH SENTINEL LYMPH NODE BX;  Surgeon: Christene Lye, MD;  Location: [facility] ORS;  Service: General;  Laterality: Right;  . Breast lumpectomy Left 07/13/2015    Procedure: BREAST LUMPECTOMY;  Surgeon: Christene Lye, MD;  Location: [facility] ORS;  Service: General;  Laterality: Left;  . Portacath placement Left 08/16/2015    Procedure: INSERTION PORT-A-CATH;  Surgeon: Christene Lye, MD;  Location: [facility] ORS;  Service: General;  Laterality: Left;    Current Outpatient Rx  Name  Route  Sig  Dispense  Refill  . amLODipine (NORVASC) 5 MG tablet      TAKE ONE TABLET BY MOUTH ONCE DAILY   90 tablet   1   . cholecalciferol (VITAMIN D) 1000 units tablet   Oral   Take 1,000 Units by mouth daily.         . DULoxetine (CYMBALTA) 60 MG capsule   Oral   Take 1 capsule (60 mg total) by mouth daily.   30 capsule   3   . furosemide (LASIX) 20 MG tablet   Oral   Take 1 tablet (20 mg total) by mouth 2 (two) times daily.   8 tablet   0   . gabapentin (NEURONTIN) 300 MG capsule      Take 2 Caps in morning and evening and 1 capsule  midday.   150 capsule   1   . hydrochlorothiazide (HYDRODIURIL) 25 MG tablet      TAKE ONE TABLET BY MOUTH ONCE DAILY   90 tablet   0   . HYDROcodone-acetaminophen (NORCO/VICODIN) 5-325 MG tablet      1 pill every 8 hours as needed for pain   40 tablet   0   . lidocaine-prilocaine (EMLA) cream      Apply to port site 1-2 hours prior to port being accessed.  Cover with plastic wrap.   1 each   3   . ondansetron (ZOFRAN) 4 MG tablet   Oral   Take 1 tablet (4 mg total) by mouth every 6 (six) hours as needed.   30 tablet   3   . Potassium Chloride ER 20 MEQ TBCR   Oral   Take 1 tablet by mouth 2 (two) times daily. X 4 days.   8 tablet   0   . pravastatin (PRAVACHOL)  10 MG tablet      TAKE ONE TABLET BY MOUTH ONCE DAILY   90 tablet   1     Allergies Lovastatin  Family History  Problem Relation Age of Onset  . Cancer Sister 5    breast cancer  . Breast cancer Sister 75  . Colon cancer Neg Hx     Social History Social History  Substance Use Topics  . Smoking status: Never Smoker   . Smokeless tobacco: Never Used  . Alcohol Use: No    Review of Systems  Constitutional: Negative for fever. Cardiovascular: Negative for chest pain. Respiratory: Negative for shortness of breath. Gastrointestinal: Negative for abdominal pain, vomiting and diarrhea. Neurological: Positive for headache. Positive bilateral blurry vision  10-point ROS otherwise negative.  ____________________________________________   PHYSICAL EXAM:  VITAL SIGNS: ED Triage Vitals  Enc Vitals Group     BP 02/21/16 1951 145/77 mmHg     Pulse Rate 02/21/16 1951 80     Resp 02/21/16 1951 18     Temp 02/21/16 1951 98 F (36.7 C)     Temp Source 02/21/16 1951 Oral     SpO2 02/21/16 1951 98 %     Weight 02/21/16 2005 122 lb (55.339 kg)     Height 02/21/16 2005 5\' 2"  (1.575 m)   Constitutional: Alert and oriented. Well appearing and in no distress. Eyes: Bilateral injected conjunctiva. Pupils are roughly 4 mm dilated and poorly reactive. Pressure was checked and was 55 in the left eye and 51 in the right eye. ENT   Head: Normocephalic and atraumatic.   Nose: No congestion/rhinnorhea.   Mouth/Throat: Mucous membranes are moist.   Neck: No stridor. Hematological/Lymphatic/Immunilogical: No cervical lymphadenopathy. Cardiovascular: Normal rate, regular rhythm.  No murmurs, rubs, or gallops. Respiratory: Normal respiratory effort without tachypnea nor retractions. Breath sounds are clear and equal bilaterally. No wheezes/rales/rhonchi. Gastrointestinal: Soft and nontender. No distention.  Genitourinary: Deferred Musculoskeletal: Normal range of motion in  all extremities. No joint effusions.   Neurologic:  Normal speech and language. No gross focal neurologic deficits are appreciated.  Skin:  Skin is warm, dry and intact. No rash noted. Psychiatric: Mood and affect are normal. Speech and behavior are normal. Patient exhibits appropriate insight and judgment.  ____________________________________________    LABS (pertinent positives/negatives)  None  ____________________________________________   EKG  None  ____________________________________________    RADIOLOGY  CT head IMPRESSION: Negative CT of the head.  ____________________________________________   PROCEDURES  Procedure(s) performed: None  Critical Care performed: No  ____________________________________________   INITIAL IMPRESSION / ASSESSMENT AND PLAN / ED COURSE  Pertinent labs & imaging results that were available during my care of the patient were reviewed by me and considered in my medical decision making (see chart for details).  Patient presented to the emergency department today because of concerns for headache and bilateral blurry vision. On exam patient did have elevated pressures bilaterally. I do have concern that she has glaucoma. Could be secondary to duloxetine. Did discuss with Dr. George Ina with ophthalmology who will come and evaluate the patient in the emergency department.   Dr. George Ina will take patient to [facility] for procedure to try to help relieve pressure ____________________________________________   FINAL CLINICAL IMPRESSION(S) / ED DIAGNOSES  Final diagnoses:  Glaucoma     Note: This dictation was prepared with Dragon dictation. Any transcriptional errors that result from this process are unintentional    Nance Pear, MD 02/22/16 0023

## 2016-02-21 NOTE — ED Notes (Signed)
Pt. States she was able to get relief today from symptoms by putting ice pack on forehead.  Pt. Given ice pack for forehead and lights turned down in room.  Pt. Has family in room.

## 2016-02-23 NOTE — Telephone Encounter (Signed)
Per Nira Conn, RN. Spoke to patient on 02-21-16 and instructed her to take Cymbalta every other day. If she continues to have problems, patient advised to call us.

## 2016-02-26 ENCOUNTER — Other Ambulatory Visit: Payer: Self-pay | Admitting: Family Medicine

## 2016-02-26 ENCOUNTER — Telehealth: Payer: Self-pay | Admitting: Family Medicine

## 2016-02-26 ENCOUNTER — Encounter: Payer: Self-pay | Admitting: Radiation Oncology

## 2016-02-26 ENCOUNTER — Ambulatory Visit
Admission: RE | Admit: 2016-02-26 | Discharge: 2016-02-26 | Disposition: A | Discharge status: Home / Self Care | Payer: Medicare Other | Source: Ambulatory Visit | Attending: Radiation Oncology | Admitting: Radiation Oncology

## 2016-02-26 VITALS — BP 105/74 | HR 88 | Temp 98.9°F | Wt 112.4 lb

## 2016-02-26 DIAGNOSIS — G629 Polyneuropathy, unspecified: Secondary | ICD-10-CM | POA: Diagnosis not present

## 2016-02-26 DIAGNOSIS — Z170 Estrogen receptor positive status [ER+]: Secondary | ICD-10-CM | POA: Insufficient documentation

## 2016-02-26 DIAGNOSIS — Z510 Encounter for antineoplastic radiation therapy: Secondary | ICD-10-CM | POA: Diagnosis not present

## 2016-02-26 DIAGNOSIS — C50911 Malignant neoplasm of unspecified site of right female breast: Secondary | ICD-10-CM | POA: Diagnosis not present

## 2016-02-26 NOTE — Progress Notes (Signed)
Radiation Oncology Follow up Note  Name: Sandra [name]   Date:   02/26/2016 MRN:  [id number] DOB: Nov 17, 1945    This 70 y.o. 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  REFERRING PROVIDER: Lucille Passy, MD  HPI: Patient is a 70 year old female who has recently completed adjuvant chemotherapy for stage IA invasive mammary carcinoma (T1 CN 0 M0) status post wide local excision and sentinel node biopsy. Tumor was ER/PR positive HER-2/neu negative with high risk of recurrence by MammaPrint. She has received Cytoxan and Adriamycin and Taxol. She did develop peripheral neuropathy had some acute exacerbation of glaucoma necessitating YAG laser treatment may be related to her duloxetine. Otherwise she is doing well specifically denies breast tenderness cough or bone pain.  COMPLICATIONS OF TREATMENT: none  FOLLOW UP COMPLIANCE: keeps appointments   PHYSICAL EXAM:  BP 105/74   Pulse 88   Temp 98.9 F (37.2 C)   Wt 112 lb 7 oz (51 kg)   LMP  (LMP Unknown)   BMI 20.56 kg/m  Lungs are clear to A&P cardiac examination essentially unremarkable with regular rate and rhythm. No dominant mass or nodularity is noted in either breast in 2 positions examined. Incision is well-healed. No axillary or supraclavicular adenopathy is appreciated. Cosmetic result is excellent. Well-developed well-nourished patient in NAD. HEENT reveals PERLA, EOMI, discs not visualized.  Oral cavity is clear. No oral mucosal lesions are identified. Neck is clear without evidence of cervical or supraclavicular adenopathy. Lungs are clear to A&P. Cardiac examination is essentially unremarkable with regular rate and rhythm without murmur rub or thrill. Abdomen is benign with no organomegaly or masses noted. Motor sensory and DTR levels are equal and symmetric  in the upper and lower extremities. Cranial nerves II through XII are grossly intact. Proprioception is intact. No peripheral adenopathy or edema is identified. No motor or sensory levels are noted. Crude visual fields are within normal range.  RADIOLOGY RESULTS: Mammograms ultrasound again reviewed  PLAN: At this time I to go ahead with hypofractionated course of radiation therapy to her right breast. Will treat over 4 weeks to her whole breast boosting her scar another 1400 cGy using electron beam. Risks and benefits of radiation again were reviewed with the patient and her husband. Side effects such as skin reaction fatigue alteration of blood counts possible inclusion of superficial lung all were discussed in detail with the patient. They both seem to comprehend my treatment plan well. I've personally ordered and scheduled CT simulation for later this week. Patient also will be candidate for aromatase inhibitor therapy after completion of radiation.  I would like to take this opportunity to thank you for allowing me to participate in the care of your patient.Armstead Peaks., MD

## 2016-02-26 NOTE — Telephone Encounter (Signed)
Patient Name: Sandra [name]  DOB: 14-May-1946    Initial Comment Caller States she is having weakness and loss of appetite   Nurse Assessment  Nurse: Leilani Merl, RN, Heather Date/Time (Eastern Time): 02/26/2016 8:54:11 AM  Confirm and document reason for call. If symptomatic, describe symptoms. You must click the next button to save text entered. ---Caller States she is having weakness and loss of appetite, she finished chemo in the middle of June. She started with the symptoms 2 weeks ago.  Has the patient traveled out of the country within the last 30 days? ---Not Applicable  Does the patient have any new or worsening symptoms? ---Yes  Will a triage be completed? ---Yes  Related visit to physician within the last 2 weeks? ---No  Does the PT have any chronic conditions? (i.e. diabetes, asthma, etc.) ---Yes  List chronic conditions. ---breast CA  Is this a behavioral health or substance abuse call? ---No     Guidelines    Guideline Title Affirmed Question Affirmed Notes  Weakness (Generalized) and Fatigue [1] MODERATE weakness (i.e., interferes with work, school, normal activities) AND [2] cause unknown (Exceptions: weakness with acute minor illness, or weakness from poor fluid intake)    Final Disposition User   See Physician within 4 Hours (or PCP triage) Standifer, RN, Conservator, museum/gallery states that she does not want to go to the ED or [facility], she would rather be seen at the office.   Referrals  REFERRED TO PCP OFFICE   Disagree/Comply: Comply

## 2016-02-26 NOTE — Progress Notes (Signed)
Answered patient questions regarding radiation treatments.  Patient and her husband knew most of the information given in her previous appt.

## 2016-02-26 NOTE — Telephone Encounter (Signed)
Lm on pts vm requesting a call back to update status and schedule OV. Ok to place in same day slot

## 2016-02-26 NOTE — Telephone Encounter (Signed)
Please call to check on pt and schedule her an appt with me.  Ok to use same day slot tomorrow.

## 2016-02-27 ENCOUNTER — Ambulatory Visit (INDEPENDENT_AMBULATORY_CARE_PROVIDER_SITE_OTHER): Payer: Medicare Other | Admitting: Family Medicine

## 2016-02-27 VITALS — BP 102/62 | HR 79 | Temp 97.9°F | Wt 116.5 lb

## 2016-02-27 DIAGNOSIS — R634 Abnormal weight loss: Secondary | ICD-10-CM | POA: Diagnosis not present

## 2016-02-27 DIAGNOSIS — C50211 Malignant neoplasm of upper-inner quadrant of right female breast: Secondary | ICD-10-CM

## 2016-02-27 DIAGNOSIS — R630 Anorexia: Secondary | ICD-10-CM | POA: Diagnosis not present

## 2016-02-27 LAB — COMPREHENSIVE METABOLIC PANEL
ALBUMIN: 4.4 g/dL (ref 3.5–5.2)
ALT: 16 U/L (ref 0–35)
AST: 20 U/L (ref 0–37)
Alkaline Phosphatase: 44 U/L (ref 39–117)
BUN: 15 mg/dL (ref 6–23)
CHLORIDE: 96 meq/L (ref 96–112)
CO2: 26 meq/L (ref 19–32)
Calcium: 10.1 mg/dL (ref 8.4–10.5)
Creatinine, Ser: 0.94 mg/dL (ref 0.40–1.20)
GFR: 75.48 mL/min (ref >60.00)
Glucose, Bld: 118 mg/dL — ABNORMAL HIGH (ref 70–99)
POTASSIUM: 3 meq/L — AB (ref 3.5–5.1)
SODIUM: 131 meq/L — AB (ref 135–145)
Total Bilirubin: 1 mg/dL (ref 0.2–1.2)
Total Protein: 7 g/dL (ref 6.0–8.3)

## 2016-02-27 MED ORDER — DRONABINOL 2.5 MG PO CAPS: 2.5000 mg | ORAL_CAPSULE | Freq: Two times a day (BID) | ORAL | 0 refills | Status: DC

## 2016-02-27 MED ORDER — HYDROCHLOROTHIAZIDE 25 MG PO TABS: 25.0000 mg | ORAL_TABLET | Freq: Every day | ORAL | 0 refills | Status: DC

## 2016-02-27 NOTE — Progress Notes (Signed)
Subjective:   Patient ID: Sandra [name], female    DOB: 1946/06/13, 70 y.o.   MRN: [id number]  Sandra [name] is a pleasant 70 y.o. year old female who presents to clinic today with Anorexia  on 02/27/2016  HPI:  Currently undergoing treatment for right breast cancer.  She completed chemotherapy in June 2017 after her wide local excision of her breast cancer and has recently started whole breast radiation.  Saw her radiation oncologist yesterday.  Note reviewed.  She has been having increased weakness and loss of appetite since finishing chemotherapy.  Has lost 8 pounds in less than a week.  Denies nausea, vomiting diarrhea.  Denies feeling depressed or anxious.  Wt Readings from Last 3 Encounters:  02/27/16 116 lb 8 oz (52.8 kg)  02/26/16 112 lb 7 oz (51 kg)  02/21/16 122 lb (55.3 kg)   Current Outpatient Prescriptions on File Prior to Visit  Medication Sig Dispense Refill  . amLODipine (NORVASC) 5 MG tablet TAKE ONE TABLET BY MOUTH ONCE DAILY 90 tablet 1  . cholecalciferol (VITAMIN D) 1000 units tablet Take 1,000 Units by mouth daily.    . furosemide (LASIX) 20 MG tablet Take 1 tablet (20 mg total) by mouth 2 (two) times daily. 8 tablet 0  . gabapentin (NEURONTIN) 300 MG capsule Take 2 Caps in morning and evening and 1 capsule midday. 150 capsule 1  . HYDROcodone-acetaminophen (NORCO/VICODIN) 5-325 MG tablet 1 pill every 8 hours as needed for pain 40 tablet 0  . lidocaine-prilocaine (EMLA) cream Apply to port site 1-2 hours prior to port being accessed.  Cover with plastic wrap. 1 each 3  . ondansetron (ZOFRAN) 4 MG tablet Take 1 tablet (4 mg total) by mouth every 6 (six) hours as needed. 30 tablet 3  . Potassium Chloride ER 20 MEQ TBCR Take 1 tablet by mouth 2 (two) times daily. X 4 days. 8 tablet 0  . pravastatin (PRAVACHOL) 10 MG tablet TAKE ONE TABLET BY MOUTH ONCE DAILY 90 tablet 1   No current facility-administered medications on file prior to  visit.     Allergies  Allergen Reactions  . Lovastatin Anaphylaxis    Tongue swelling  . Cymbalta [Duloxetine Hcl]     glaucoma    Past Medical History:  Diagnosis Date  . Cancer ([place])   . Hyperlipidemia   . Hypertension   . Visual blurriness    SOMETHING NEW THAT HAS DEVELOPED SINCE 07-13-15 SURGERY- SEEN AT [facility] ENT AND EXAM WAS NORMAL PER PT (08-09-15)    Past Surgical History:  Procedure Laterality Date  . ABDOMINAL HYSTERECTOMY  1987  . BREAST BIOPSY Right    neg  . BREAST BIOPSY Left    4 oclock, FIBROEPITHELIAL LESION FAVOR CELLULAR FIBROADENOMA  . BREAST BIOPSY Left    2 oclock, CYSTIC APOCRINE METAPLASIA  . BREAST BIOPSY Right 07/13/15   130 INVASIVE MAMMARY CARCINOMA, NO SPECIAL TYPE  . BREAST LUMPECTOMY Left 07/13/2015   Procedure: BREAST LUMPECTOMY;  Surgeon: Christene Lye, MD;  Location: [facility] ORS;  Service: General;  Laterality: Left;  . BREAST LUMPECTOMY WITH SENTINEL LYMPH NODE BIOPSY Right 07/13/2015   Procedure: BREAST LUMPECTOMY WITH SENTINEL LYMPH NODE BX;  Surgeon: Christene Lye, MD;  Location: [facility] ORS;  Service: General;  Laterality: Right;  . BUNIONECTOMY Right 1993  . fatty tumor Left 2013   side  . PORTACATH PLACEMENT Left 08/16/2015   Procedure: INSERTION PORT-A-CATH;  Surgeon: Christene Lye, MD;  Location: [facility]  ORS;  Service: General;  Laterality: Left;    Family History  Problem Relation Age of Onset  . Cancer Sister 23    breast cancer  . Breast cancer Sister 24  . Colon cancer Neg Hx     Social History   Social History  . Marital status: Married    Spouse name: N/A  . Number of children: N/A  . Years of education: N/A   Occupational History  . Not on file.   Social History Main Topics  . Smoking status: Never Smoker  . Smokeless tobacco: Never Used  . Alcohol use No  . Drug use: No  . Sexual activity: Not on file   Other Topics Concern  . Not on file   Social History Narrative   End of life  wishes (updated 01/2013)-   Daughter of wishes (Cherlyl Duwayne Heck)      Desires CPR.   Desires life support if reasonable.            The PMH, PSH, Social History, Family History, Medications, and allergies have been reviewed in [facility], and have been updated if relevant.     Review of Systems  Constitutional: Positive for appetite change, fatigue and unexpected weight change.  Gastrointestinal: Positive for nausea. Negative for diarrhea and vomiting.  Neurological: Positive for weakness. Negative for dizziness, facial asymmetry, speech difficulty, light-headedness, numbness and headaches.  Psychiatric/Behavioral: Negative for behavioral problems, confusion, decreased concentration, dysphoric mood, self-injury, sleep disturbance and suicidal ideas. The patient is not nervous/anxious.        Objective:    BP 102/62   Pulse 79   Temp 97.9 F (36.6 C) (Oral)   Wt 116 lb 8 oz (52.8 kg)   LMP  (LMP Unknown)   SpO2 99%   BMI 21.31 kg/m    Physical Exam  Constitutional: She is oriented to person, place, and time. No distress.  Appears thin  HENT:  Head: Normocephalic.  Eyes: Conjunctivae are normal.  Cardiovascular: Normal rate.   Pulmonary/Chest: Effort normal.  Musculoskeletal: Normal range of motion.  Neurological: She is alert and oriented to person, place, and time.  Skin: Skin is warm and dry. She is not diaphoretic.  Psychiatric: She has a normal mood and affect. Her behavior is normal. Judgment and thought content normal.  Nursing note and vitals reviewed.         Assessment & Plan:   Breast cancer of upper-inner quadrant of right female breast (HCC)  Loss of appetite - Plan: Comprehensive metabolic panel  Unintentional weight loss No Follow-up on file.

## 2016-02-27 NOTE — Progress Notes (Signed)
Pre visit review using our clinic review tool, if applicable. No additional management support is needed unless otherwise documented below in the visit note. 

## 2016-02-27 NOTE — Assessment & Plan Note (Signed)
>  25 minutes spent in face to face time with patient, >50% spent in counselling or coordination of care With unintentional weight loss. Secondary to chemotherapy/radiation. Discussed tx options. She does not like ensure shakes.  Not having any other symptoms like changes in taste preferences, nausea, depression. Trial of marinol 2.5 mg twice daily is appropriate. Rx written and given to pt. Call or return to clinic prn if these symptoms worsen or fail to improve as anticipated. The patient indicates understanding of these issues and agrees with the plan.

## 2016-02-27 NOTE — Patient Instructions (Signed)
Great to see you. Please try to drink more fluids and eat what you can.  Keep me updated.

## 2016-02-28 ENCOUNTER — Ambulatory Visit: Payer: Medicare Other | Admitting: Family Medicine

## 2016-02-28 ENCOUNTER — Ambulatory Visit
Admission: RE | Admit: 2016-02-28 | Discharge: 2016-02-28 | Disposition: A | Discharge status: Home / Self Care | Payer: Medicare Other | Source: Ambulatory Visit | Attending: Radiation Oncology | Admitting: Radiation Oncology

## 2016-02-28 DIAGNOSIS — C50911 Malignant neoplasm of unspecified site of right female breast: Secondary | ICD-10-CM | POA: Diagnosis not present

## 2016-02-29 ENCOUNTER — Telehealth: Payer: Self-pay

## 2016-02-29 DIAGNOSIS — C50911 Malignant neoplasm of unspecified site of right female breast: Secondary | ICD-10-CM | POA: Diagnosis not present

## 2016-02-29 NOTE — Telephone Encounter (Signed)
Attempted to reach pt to discuss CPE with her PCP per Dr. Hulen Shouts instructions.

## 2016-03-01 ENCOUNTER — Other Ambulatory Visit: Payer: Self-pay | Admitting: *Deleted

## 2016-03-01 DIAGNOSIS — C50211 Malignant neoplasm of upper-inner quadrant of right female breast: Secondary | ICD-10-CM

## 2016-03-06 ENCOUNTER — Telehealth: Payer: Self-pay

## 2016-03-06 ENCOUNTER — Ambulatory Visit
Admission: RE | Admit: 2016-03-06 | Discharge: 2016-03-06 | Disposition: A | Discharge status: Home / Self Care | Payer: Medicare Other | Source: Ambulatory Visit | Attending: Radiation Oncology | Admitting: Radiation Oncology

## 2016-03-06 DIAGNOSIS — C50911 Malignant neoplasm of unspecified site of right female breast: Secondary | ICD-10-CM | POA: Diagnosis not present

## 2016-03-06 DIAGNOSIS — G620 Drug-induced polyneuropathy: Secondary | ICD-10-CM

## 2016-03-06 DIAGNOSIS — T451X5A Adverse effect of antineoplastic and immunosuppressive drugs, initial encounter: Principal | ICD-10-CM

## 2016-03-06 NOTE — Telephone Encounter (Signed)
Patient is having numbness in hand and feet from chemotherapy needs something prescribed

## 2016-03-07 ENCOUNTER — Ambulatory Visit
Admission: RE | Admit: 2016-03-07 | Discharge: 2016-03-07 | Disposition: A | Discharge status: Home / Self Care | Payer: Medicare Other | Source: Ambulatory Visit | Attending: Radiation Oncology | Admitting: Radiation Oncology

## 2016-03-07 DIAGNOSIS — C50911 Malignant neoplasm of unspecified site of right female breast: Secondary | ICD-10-CM | POA: Diagnosis not present

## 2016-03-07 MED ORDER — AMITRIPTYLINE HCL 25 MG PO TABS: 25.0000 mg | ORAL_TABLET | Freq: Every day | ORAL | 0 refills | Status: DC

## 2016-03-07 NOTE — Telephone Encounter (Signed)
Instructed pt that MD has prescribed Amitriptyline 25 mg daily at bedtime.  pt Advised pt to take medication at bedtime to avoid drowsiness.  I pt educated that drug is an antidepressant drugs. I explained that this medication may cause symptoms of sleepiness, drowsiness, dry mouth, head ache, mood swings, N&V , confusion, hallucination and change of bowel habits- constipation or diarrhea and decrease ability to urinate.  I asked the patient to call our office if she is not able to tolerate drug. In an emergency situation-allergic reaction, she should seek emergency services. Explained that signs of allergic reaction or severe intolerance may include chest pain, shortness of breath and rash/hives.  Teach back process performed with patient.  Explained RX called into [facility] on [address].

## 2016-03-08 ENCOUNTER — Ambulatory Visit
Admission: RE | Admit: 2016-03-08 | Discharge: 2016-03-08 | Disposition: A | Discharge status: Home / Self Care | Payer: Medicare Other | Source: Ambulatory Visit | Attending: Radiation Oncology | Admitting: Radiation Oncology

## 2016-03-08 DIAGNOSIS — C50911 Malignant neoplasm of unspecified site of right female breast: Secondary | ICD-10-CM | POA: Diagnosis not present

## 2016-03-11 ENCOUNTER — Ambulatory Visit
Admission: RE | Admit: 2016-03-11 | Discharge: 2016-03-11 | Disposition: A | Discharge status: Home / Self Care | Payer: Medicare Other | Source: Ambulatory Visit | Attending: Radiation Oncology | Admitting: Radiation Oncology

## 2016-03-11 DIAGNOSIS — C50911 Malignant neoplasm of unspecified site of right female breast: Secondary | ICD-10-CM | POA: Diagnosis not present

## 2016-03-12 ENCOUNTER — Ambulatory Visit
Admission: RE | Admit: 2016-03-12 | Discharge: 2016-03-12 | Disposition: A | Discharge status: Home / Self Care | Payer: Medicare Other | Source: Ambulatory Visit | Attending: Radiation Oncology | Admitting: Radiation Oncology

## 2016-03-12 DIAGNOSIS — C50911 Malignant neoplasm of unspecified site of right female breast: Secondary | ICD-10-CM | POA: Diagnosis not present

## 2016-03-13 ENCOUNTER — Ambulatory Visit
Admission: RE | Admit: 2016-03-13 | Discharge: 2016-03-13 | Disposition: A | Discharge status: Home / Self Care | Payer: Medicare Other | Source: Ambulatory Visit | Attending: Radiation Oncology | Admitting: Radiation Oncology

## 2016-03-13 DIAGNOSIS — C50911 Malignant neoplasm of unspecified site of right female breast: Secondary | ICD-10-CM | POA: Diagnosis not present

## 2016-03-14 ENCOUNTER — Ambulatory Visit
Admission: RE | Admit: 2016-03-14 | Discharge: 2016-03-14 | Disposition: A | Discharge status: Home / Self Care | Payer: Medicare Other | Source: Ambulatory Visit | Attending: Radiation Oncology | Admitting: Radiation Oncology

## 2016-03-14 DIAGNOSIS — C50911 Malignant neoplasm of unspecified site of right female breast: Secondary | ICD-10-CM | POA: Diagnosis not present

## 2016-03-15 ENCOUNTER — Ambulatory Visit
Admission: RE | Admit: 2016-03-15 | Discharge: 2016-03-15 | Disposition: A | Discharge status: Home / Self Care | Payer: Medicare Other | Source: Ambulatory Visit | Attending: Radiation Oncology | Admitting: Radiation Oncology

## 2016-03-15 DIAGNOSIS — C50911 Malignant neoplasm of unspecified site of right female breast: Secondary | ICD-10-CM | POA: Diagnosis not present

## 2016-03-18 ENCOUNTER — Inpatient Hospital Stay: Payer: Medicare Other | Attending: Radiation Oncology | Admitting: *Deleted

## 2016-03-18 ENCOUNTER — Ambulatory Visit
Admission: RE | Admit: 2016-03-18 | Discharge: 2016-03-18 | Disposition: A | Discharge status: Home / Self Care | Payer: Medicare Other | Source: Ambulatory Visit | Attending: Radiation Oncology | Admitting: Radiation Oncology

## 2016-03-18 DIAGNOSIS — Z9221 Personal history of antineoplastic chemotherapy: Secondary | ICD-10-CM | POA: Insufficient documentation

## 2016-03-18 DIAGNOSIS — G620 Drug-induced polyneuropathy: Secondary | ICD-10-CM | POA: Insufficient documentation

## 2016-03-18 DIAGNOSIS — I10 Essential (primary) hypertension: Secondary | ICD-10-CM | POA: Diagnosis not present

## 2016-03-18 DIAGNOSIS — Z79899 Other long term (current) drug therapy: Secondary | ICD-10-CM | POA: Insufficient documentation

## 2016-03-18 DIAGNOSIS — R51 Headache: Secondary | ICD-10-CM | POA: Diagnosis not present

## 2016-03-18 DIAGNOSIS — E876 Hypokalemia: Secondary | ICD-10-CM | POA: Insufficient documentation

## 2016-03-18 DIAGNOSIS — E785 Hyperlipidemia, unspecified: Secondary | ICD-10-CM | POA: Diagnosis not present

## 2016-03-18 DIAGNOSIS — H409 Unspecified glaucoma: Secondary | ICD-10-CM | POA: Insufficient documentation

## 2016-03-18 DIAGNOSIS — Z170 Estrogen receptor positive status [ER+]: Secondary | ICD-10-CM | POA: Insufficient documentation

## 2016-03-18 DIAGNOSIS — H538 Other visual disturbances: Secondary | ICD-10-CM | POA: Diagnosis not present

## 2016-03-18 DIAGNOSIS — T451X5A Adverse effect of antineoplastic and immunosuppressive drugs, initial encounter: Secondary | ICD-10-CM

## 2016-03-18 DIAGNOSIS — Z923 Personal history of irradiation: Secondary | ICD-10-CM | POA: Diagnosis not present

## 2016-03-18 DIAGNOSIS — C50211 Malignant neoplasm of upper-inner quadrant of right female breast: Secondary | ICD-10-CM

## 2016-03-18 DIAGNOSIS — C50911 Malignant neoplasm of unspecified site of right female breast: Secondary | ICD-10-CM | POA: Diagnosis not present

## 2016-03-18 DIAGNOSIS — T451X5S Adverse effect of antineoplastic and immunosuppressive drugs, sequela: Secondary | ICD-10-CM | POA: Diagnosis not present

## 2016-03-18 LAB — CBC
HCT: 35.2 % (ref 35.0–47.0)
Hemoglobin: 12.1 g/dL (ref 12.0–16.0)
MCH: 29.5 pg (ref 26.0–34.0)
MCHC: 34.5 g/dL (ref 32.0–36.0)
MCV: 85.5 fL (ref 80.0–100.0)
PLATELETS: 295 10*3/uL (ref 150–440)
RBC: 4.11 MIL/uL (ref 3.80–5.20)
RDW: 14.2 % (ref 11.5–14.5)
WBC: 2.4 10*3/uL — ABNORMAL LOW (ref 3.6–11.0)

## 2016-03-19 ENCOUNTER — Ambulatory Visit
Admission: RE | Admit: 2016-03-19 | Discharge: 2016-03-19 | Disposition: A | Discharge status: Home / Self Care | Payer: Medicare Other | Source: Ambulatory Visit | Attending: Radiation Oncology | Admitting: Radiation Oncology

## 2016-03-19 DIAGNOSIS — C50911 Malignant neoplasm of unspecified site of right female breast: Secondary | ICD-10-CM | POA: Diagnosis not present

## 2016-03-20 ENCOUNTER — Ambulatory Visit
Admission: RE | Admit: 2016-03-20 | Discharge: 2016-03-20 | Disposition: A | Discharge status: Home / Self Care | Payer: Medicare Other | Source: Ambulatory Visit | Attending: Radiation Oncology | Admitting: Radiation Oncology

## 2016-03-20 DIAGNOSIS — C50911 Malignant neoplasm of unspecified site of right female breast: Secondary | ICD-10-CM | POA: Diagnosis not present

## 2016-03-21 ENCOUNTER — Ambulatory Visit
Admission: RE | Admit: 2016-03-21 | Discharge: 2016-03-21 | Disposition: A | Discharge status: Home / Self Care | Payer: Medicare Other | Source: Ambulatory Visit | Attending: Radiation Oncology | Admitting: Radiation Oncology

## 2016-03-21 DIAGNOSIS — C50911 Malignant neoplasm of unspecified site of right female breast: Secondary | ICD-10-CM | POA: Diagnosis not present

## 2016-03-22 ENCOUNTER — Ambulatory Visit
Admission: RE | Admit: 2016-03-22 | Discharge: 2016-03-22 | Disposition: A | Discharge status: Home / Self Care | Payer: Medicare Other | Source: Ambulatory Visit | Attending: Radiation Oncology | Admitting: Radiation Oncology

## 2016-03-22 DIAGNOSIS — C50911 Malignant neoplasm of unspecified site of right female breast: Secondary | ICD-10-CM | POA: Diagnosis not present

## 2016-03-25 ENCOUNTER — Ambulatory Visit
Admission: RE | Admit: 2016-03-25 | Discharge: 2016-03-25 | Disposition: A | Discharge status: Home / Self Care | Payer: Medicare Other | Source: Ambulatory Visit | Attending: Radiation Oncology | Admitting: Radiation Oncology

## 2016-03-25 DIAGNOSIS — C50911 Malignant neoplasm of unspecified site of right female breast: Secondary | ICD-10-CM | POA: Diagnosis not present

## 2016-03-26 ENCOUNTER — Ambulatory Visit
Admission: RE | Admit: 2016-03-26 | Discharge: 2016-03-26 | Disposition: A | Discharge status: Home / Self Care | Payer: Medicare Other | Source: Ambulatory Visit | Attending: Radiation Oncology | Admitting: Radiation Oncology

## 2016-03-26 DIAGNOSIS — C50911 Malignant neoplasm of unspecified site of right female breast: Secondary | ICD-10-CM | POA: Diagnosis not present

## 2016-03-26 NOTE — Progress Notes (Signed)
  Oncology Nurse Navigator Documentation  Navigator Location: [facility] Onc (03/25/16 [id number]) Navigator Encounter Type: Other;Follow-up Appt (03/25/16 [id number])           Patient Visit Type: Follow-up;RadOnc (03/25/16 [id number])   Barriers/Navigation Needs: No Needs (03/25/16 [id number])                          Time Spent with Patient: 15 (03/25/16 [id number])  Patient states she is almost a third of the way finished with radiation, and doing well.

## 2016-03-27 ENCOUNTER — Ambulatory Visit
Admission: RE | Admit: 2016-03-27 | Discharge: 2016-03-27 | Disposition: A | Discharge status: Home / Self Care | Payer: Medicare Other | Source: Ambulatory Visit | Attending: Radiation Oncology | Admitting: Radiation Oncology

## 2016-03-27 DIAGNOSIS — C50911 Malignant neoplasm of unspecified site of right female breast: Secondary | ICD-10-CM | POA: Diagnosis not present

## 2016-03-28 ENCOUNTER — Ambulatory Visit
Admission: RE | Admit: 2016-03-28 | Discharge: 2016-03-28 | Disposition: A | Discharge status: Home / Self Care | Payer: Medicare Other | Source: Ambulatory Visit | Attending: Radiation Oncology | Admitting: Radiation Oncology

## 2016-03-28 DIAGNOSIS — C50911 Malignant neoplasm of unspecified site of right female breast: Secondary | ICD-10-CM | POA: Diagnosis not present

## 2016-03-29 ENCOUNTER — Encounter (INDEPENDENT_AMBULATORY_CARE_PROVIDER_SITE_OTHER): Payer: Self-pay

## 2016-03-29 ENCOUNTER — Inpatient Hospital Stay (HOSPITAL_BASED_OUTPATIENT_CLINIC_OR_DEPARTMENT_OTHER): Payer: Medicare Other | Admitting: Internal Medicine

## 2016-03-29 ENCOUNTER — Inpatient Hospital Stay: Payer: Medicare Other

## 2016-03-29 ENCOUNTER — Ambulatory Visit
Admission: RE | Admit: 2016-03-29 | Discharge: 2016-03-29 | Disposition: A | Discharge status: Home / Self Care | Payer: Medicare Other | Source: Ambulatory Visit | Attending: Radiation Oncology | Admitting: Radiation Oncology

## 2016-03-29 VITALS — BP 128/85 | HR 101 | Temp 96.4°F | Resp 17 | Ht 62.0 in | Wt 120.6 lb

## 2016-03-29 DIAGNOSIS — R51 Headache: Secondary | ICD-10-CM

## 2016-03-29 DIAGNOSIS — I10 Essential (primary) hypertension: Secondary | ICD-10-CM

## 2016-03-29 DIAGNOSIS — C50911 Malignant neoplasm of unspecified site of right female breast: Secondary | ICD-10-CM | POA: Diagnosis not present

## 2016-03-29 DIAGNOSIS — C50211 Malignant neoplasm of upper-inner quadrant of right female breast: Secondary | ICD-10-CM | POA: Diagnosis not present

## 2016-03-29 DIAGNOSIS — H538 Other visual disturbances: Secondary | ICD-10-CM

## 2016-03-29 DIAGNOSIS — T451X5S Adverse effect of antineoplastic and immunosuppressive drugs, sequela: Secondary | ICD-10-CM

## 2016-03-29 DIAGNOSIS — Z9221 Personal history of antineoplastic chemotherapy: Secondary | ICD-10-CM

## 2016-03-29 DIAGNOSIS — E785 Hyperlipidemia, unspecified: Secondary | ICD-10-CM

## 2016-03-29 DIAGNOSIS — H409 Unspecified glaucoma: Secondary | ICD-10-CM

## 2016-03-29 DIAGNOSIS — G620 Drug-induced polyneuropathy: Secondary | ICD-10-CM

## 2016-03-29 DIAGNOSIS — Z79899 Other long term (current) drug therapy: Secondary | ICD-10-CM

## 2016-03-29 DIAGNOSIS — Z170 Estrogen receptor positive status [ER+]: Secondary | ICD-10-CM

## 2016-03-29 DIAGNOSIS — C50011 Malignant neoplasm of nipple and areola, right female breast: Secondary | ICD-10-CM

## 2016-03-29 DIAGNOSIS — Z923 Personal history of irradiation: Secondary | ICD-10-CM

## 2016-03-29 DIAGNOSIS — E876 Hypokalemia: Secondary | ICD-10-CM

## 2016-03-29 LAB — COMPREHENSIVE METABOLIC PANEL
ALBUMIN: 4.1 g/dL (ref 3.5–5.0)
ALT: 14 U/L (ref 14–54)
AST: 24 U/L (ref 15–41)
Alkaline Phosphatase: 43 U/L (ref 38–126)
Anion gap: 8 (ref 5–15)
BUN: 6 mg/dL (ref 6–20)
CHLORIDE: 103 mmol/L (ref 101–111)
CO2: 25 mmol/L (ref 22–32)
Calcium: 8.6 mg/dL — ABNORMAL LOW (ref 8.9–10.3)
Creatinine, Ser: 0.67 mg/dL (ref 0.44–1.00)
GFR calc Af Amer: >60 mL/min (ref >60)
GFR calc non Af Amer: >60 mL/min (ref >60)
GLUCOSE: 118 mg/dL — AB (ref 65–99)
POTASSIUM: 3 mmol/L — AB (ref 3.5–5.1)
SODIUM: 136 mmol/L (ref 135–145)
Total Bilirubin: 0.7 mg/dL (ref 0.3–1.2)
Total Protein: 6.8 g/dL (ref 6.5–8.1)

## 2016-03-29 LAB — CBC WITH DIFFERENTIAL/PLATELET
BASOS ABS: 0 10*3/uL (ref 0–0.1)
BASOS PCT: 2 %
EOS PCT: 6 %
Eosinophils Absolute: 0.2 10*3/uL (ref 0–0.7)
HCT: 35.8 % (ref 35.0–47.0)
Hemoglobin: 12.4 g/dL (ref 12.0–16.0)
Lymphocytes Relative: 21 %
Lymphs Abs: 0.6 10*3/uL — ABNORMAL LOW (ref 1.0–3.6)
MCH: 29.6 pg (ref 26.0–34.0)
MCHC: 34.7 g/dL (ref 32.0–36.0)
MCV: 85.5 fL (ref 80.0–100.0)
MONO ABS: 0.2 10*3/uL (ref 0.2–0.9)
Monocytes Relative: 9 %
Neutro Abs: 1.7 10*3/uL (ref 1.4–6.5)
Neutrophils Relative %: 62 %
PLATELETS: 288 10*3/uL (ref 150–440)
RBC: 4.18 MIL/uL (ref 3.80–5.20)
RDW: 14.2 % (ref 11.5–14.5)
WBC: 2.7 10*3/uL — ABNORMAL LOW (ref 3.6–11.0)

## 2016-03-29 MED ORDER — SODIUM CHLORIDE 0.9% FLUSH
10.0000 mL | Freq: Once | INTRAVENOUS | Status: AC
  Administered 2016-03-29: 10 mL via INTRAVENOUS
  Filled 2016-03-29: qty 10

## 2016-03-29 MED ORDER — HEPARIN SOD (PORK) LOCK FLUSH 100 UNIT/ML IV SOLN
500.0000 [IU] | Freq: Once | INTRAVENOUS | Status: AC
  Administered 2016-03-29: 500 [IU] via INTRAVENOUS

## 2016-03-29 MED ORDER — POTASSIUM CHLORIDE ER 20 MEQ PO TBCR: 1.0000 | EXTENDED_RELEASE_TABLET | Freq: Two times a day (BID) | ORAL | 0 refills | Status: DC

## 2016-03-29 MED ORDER — HEPARIN SOD (PORK) LOCK FLUSH 100 UNIT/ML IV SOLN
INTRAVENOUS | Status: AC
  Filled 2016-03-29: qty 5

## 2016-03-29 NOTE — Assessment & Plan Note (Addendum)
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 Currently finished adjuvant chemotherapy. No clinical evidence of recurrence.  Plan Arimidex starting at next visit. On radiation will finish in 7 days.   # Grade 2 neuropathy- recommend continue Neurontin 600 twice a day; discussed re: acupuncture.  # ? Glaucoma- s/p surgery; okay to continue elavil.   # Follow-up with me 6 weeks port flush/no labs.

## 2016-03-29 NOTE — Progress Notes (Signed)
[place] @ [facility] [phone]  [phone]   INITIAL CONSULT  Sandra [name] OB: 1946/05/14  MR#: [id number]  QIO#:[id number]  Patient Care Team: Lucille Passy, MD as PCP - General (Family Medicine) Glennie Isle, PA-C (Physician Assistant) Jerene Bears, MD as Consulting Physician (Gastroenterology) Lucille Passy, MD as Consulting Physician (Family Medicine) Seeplaputhur Robinette Haines, MD (General Surgery)  CHIEF COMPLAINT:  Chief Complaint  Patient presents with  . Follow-up    Breast Cancer   VISIT DIAGNOSIS:     ICD-9-CM ICD-10-CM   1. Breast cancer of upper-inner quadrant of right female breast (HCC) 174.2 C50.211 Comprehensive metabolic panel     CBC with Differential  2. 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 Hypokalemia 276.8 E87.6      Oncology History    carcinoma breast (right) inner and upper quadrant T1 cN0 M0 tumor Estrogen receptor positive progesterone receptor positive HER-2 receptor negative by fish Mammo print shows high risk (diagnosis in January of 2016) patient has 22% average risk in 5 years and 29% average risk in 10 years for distant metastectomy disease without chemotherapy on anti-hormonal treatment 2.  Patient started Cytoxan and Adriamycin on now August 30, 2015 MUGA scan shows ejection fraction of baseline 55%.  3.Patient has finished total of 4 cycles of chemotherapy on November 01, 2015 4.  Started on the Taxol from November 22, 2015     Breast cancer of upper-inner quadrant of right female breast ([place])   01/31/2016 Initial Diagnosis    Breast cancer of upper-inner quadrant of right female breast [facility])        INTERVAL HISTORY: 70 year old lady History of stage I right-sided breast cancers/p  adjuvant chemotherapy with Taxol; status post 10 treatments Taxol- Here for follow-up. Patient is awaiting to finished radiation in approximately  week.  Patient had headache/blurry vision on Cymbalta- evaluated by ophthalmology; concerning for worsening glaucoma. Had laser surgery.   Patient continues to complain of tingling and numbness in her hands;  Improve improving. Continues to Neurontin. She has also been started on Elavil.  Patient denies any fevers or chills. Denies any nausea vomiting or constipation.   REVIEW OF SYSTEMS:  A complete 10 point review of system is done which is negative for mentioned above in history of present illness.  PAST MEDICAL HISTORY: Past Medical History:  Diagnosis Date  . Cancer ([place])   . Hyperlipidemia   . Hypertension   . Visual blurriness    SOMETHING NEW THAT HAS DEVELOPED SINCE 07-13-15 SURGERY- SEEN AT [facility] ENT AND EXAM WAS NORMAL PER PT (08-09-15)    PAST SURGICAL HISTORY: Past Surgical History:  Procedure Laterality Date  . ABDOMINAL HYSTERECTOMY  1987  . BREAST BIOPSY Right    neg  . BREAST BIOPSY Left    4 oclock, FIBROEPITHELIAL LESION FAVOR CELLULAR FIBROADENOMA  . BREAST BIOPSY Left    2 oclock, CYSTIC APOCRINE METAPLASIA  . BREAST BIOPSY Right 07/13/15   130 INVASIVE MAMMARY CARCINOMA, NO SPECIAL TYPE  . BREAST LUMPECTOMY Left 07/13/2015   Procedure: BREAST LUMPECTOMY;  Surgeon: Christene Lye, MD;  Location: [facility] ORS;  Service: General;  Laterality: Left;  . BREAST LUMPECTOMY WITH SENTINEL LYMPH NODE BIOPSY Right 07/13/2015   Procedure: BREAST LUMPECTOMY WITH SENTINEL LYMPH NODE BX;  Surgeon: Christene Lye, MD;  Location: [facility] ORS;  Service: General;  Laterality: Right;  . BUNIONECTOMY Right 1993  .  fatty tumor Left 2013   side  . PORTACATH PLACEMENT Left 08/16/2015   Procedure: INSERTION PORT-A-CATH;  Surgeon: Christene Lye, MD;  Location: [facility] ORS;  Service: General;  Laterality: Left;    FAMILY HISTORY Family History  Problem Relation Age of Onset  . Cancer Sister 19    breast cancer  . Breast cancer Sister 34  . Colon cancer Neg Hx          ADVANCED DIRECTIVES:   Patient does not have any living will or healthcare power of attorney.  Information was given .  Available resources had been discussed.  We will follow-up on subsequent appointments regarding this issue HEALTH MAINTENANCE: Social History  Substance Use Topics  . Smoking status: Never Smoker  . Smokeless tobacco: Never Used  . Alcohol use No      :  Allergies  Allergen Reactions  . Lovastatin Anaphylaxis    Tongue swelling  . Cymbalta [Duloxetine Hcl]     glaucoma    Current Outpatient Prescriptions  Medication Sig Dispense Refill  . amitriptyline (ELAVIL) 25 MG tablet Take 1 tablet (25 mg total) by mouth at bedtime. 30 tablet 0  . amLODipine (NORVASC) 5 MG tablet TAKE ONE TABLET BY MOUTH ONCE DAILY 90 tablet 1  . cholecalciferol (VITAMIN D) 1000 units tablet Take 1,000 Units by mouth daily.    . hydrochlorothiazide (HYDRODIURIL) 25 MG tablet Take 1 tablet (25 mg total) by mouth daily. COMPLETE PHYSICAL EXAM REQUIRED FOR ADDITIONAL REFILLS 90 tablet 0  . lidocaine-prilocaine (EMLA) cream Apply to port site 1-2 hours prior to port being accessed.  Cover with plastic wrap. 1 each 3  . Potassium Chloride ER 20 MEQ TBCR Take 1 tablet by mouth 2 (two) times daily. 60 tablet 0  . pravastatin (PRAVACHOL) 10 MG tablet TAKE ONE TABLET BY MOUTH ONCE DAILY 90 tablet 1  . gabapentin (NEURONTIN) 300 MG capsule Take 2 Caps in morning and evening and 1 capsule midday. (Patient not taking: Reported on 03/29/2016) 150 capsule 1  . HYDROcodone-acetaminophen (NORCO/VICODIN) 5-325 MG tablet 1 pill every 8 hours as needed for pain (Patient not taking: Reported on 03/29/2016) 40 tablet 0  . ondansetron (ZOFRAN) 4 MG tablet Take 1 tablet (4 mg total) by mouth every 6 (six) hours as needed. (Patient not taking: Reported on 03/29/2016) 30 tablet 3   No current facility-administered medications for this visit.     OBJECTIVE: PHYSICAL EXAM: GENERAL:  Well developed, well  nourished, sitting comfortably in the exam room in no acute distress. MENTAL STATUS:  Alert and oriented to person, place and time.  ENT:  Oropharynx clear without lesion.  Tongue normal. Mucous membranes moist. Alopecia.  Examination of the nose shows redness.  No active bleeding. RESPIRATORY:  Clear to auscultation without rales, wheezes or rhonchi. Patient has a port placed on the left upper chest wall area.  No evidence of redness. CARDIOVASCULAR:  Regular rate and rhythm without murmur, rub or gallop.  ABDOMEN:  Soft, non-tender, with active bowel sounds, and no hepatosplenomegaly.  No masses. BACK:  No CVA tenderness.  No tenderness on percussion of the back or rib cage. SKIN:  No rashes, ulcers or lesions. EXTREMITIES: No edema, no skin discoloration or tenderness.  No palpable cords. LYMPH NODES: No palpable cervical, supraclavicular, axillary or inguinal adenopathy  NEUROLOGICAL: Unremarkable. PSYCH:  Appropriate.  Vitals:   03/29/16 1133  BP: 128/85  Pulse: (!) 101  Resp: 17  Temp: (!) 96.4 F (35.8 C)  Body mass index is 22.06 kg/m.    ECOG FS:0 - Asymptomatic  LAB RESULTS:  Infusion on 03/29/2016  Component Date Value Ref Range Status  . WBC 03/29/2016 2.7* 3.6 - 11.0 K/uL Final  . RBC 03/29/2016 4.18  3.80 - 5.20 MIL/uL Final  . Hemoglobin 03/29/2016 12.4  12.0 - 16.0 g/dL Final  . HCT 03/29/2016 35.8  35.0 - 47.0 % Final  . MCV 03/29/2016 85.5  80.0 - 100.0 fL Final  . MCH 03/29/2016 29.6  26.0 - 34.0 pg Final  . MCHC 03/29/2016 34.7  32.0 - 36.0 g/dL Final  . RDW 03/29/2016 14.2  11.5 - 14.5 % Final  . Platelets 03/29/2016 288  150 - 440 K/uL Final  . Neutrophils Relative % 03/29/2016 62  % Final  . Neutro Abs 03/29/2016 1.7  1.4 - 6.5 K/uL Final  . Lymphocytes Relative 03/29/2016 21  % Final  . Lymphs Abs 03/29/2016 0.6* 1.0 - 3.6 K/uL Final  . Monocytes Relative 03/29/2016 9  % Final  . Monocytes Absolute 03/29/2016 0.2  0.2 - 0.9 K/uL Final  .  Eosinophils Relative 03/29/2016 6  % Final  . Eosinophils Absolute 03/29/2016 0.2  0 - 0.7 K/uL Final  . Basophils Relative 03/29/2016 2  % Final  . Basophils Absolute 03/29/2016 0.0  0 - 0.1 K/uL Final  . Sodium 03/29/2016 136  135 - 145 mmol/L Final  . Potassium 03/29/2016 3.0* 3.5 - 5.1 mmol/L Final  . Chloride 03/29/2016 103  101 - 111 mmol/L Final  . CO2 03/29/2016 25  22 - 32 mmol/L Final  . Glucose, Bld 03/29/2016 118* 65 - 99 mg/dL Final  . BUN 03/29/2016 6  6 - 20 mg/dL Final  . Creatinine, Ser 03/29/2016 0.67  0.44 - 1.00 mg/dL Final  . Calcium 03/29/2016 8.6* 8.9 - 10.3 mg/dL Final  . Total Protein 03/29/2016 6.8  6.5 - 8.1 g/dL Final  . Albumin 03/29/2016 4.1  3.5 - 5.0 g/dL Final  . AST 03/29/2016 24  15 - 41 U/L Final  . ALT 03/29/2016 14  14 - 54 U/L Final  . Alkaline Phosphatase 03/29/2016 43  38 - 126 U/L Final  . Total Bilirubin 03/29/2016 0.7  0.3 - 1.2 mg/dL Final  . GFR calc non Af Amer 03/29/2016 >60  >60 mL/min Final  . GFR calc Af Amer 03/29/2016 >60  >60 mL/min Final   Comment: (NOTE) The eGFR has been calculated using the CKD EPI equation. This calculation has not been validated in all clinical situations. eGFR's persistently <60 mL/min signify possible Chronic Kidney Disease.   . Anion gap 03/29/2016 8  5 - 15 Final     STUDIES: No results found.  ASSESSMENT:   Breast cancer of upper-inner quadrant of right female breast ([place]) Stage I ER/PR positive HER-2/neu negative. Currently finished adjuvant chemotherapy. No clinical evidence of recurrence.  Plan Arimidex starting at next visit. On radiation will finish in 7 days.   # Grade 2 neuropathy- recommend continue Neurontin 600 twice a day; discussed re: acupuncture.  # ? Glaucoma- s/p surgery; okay to continue elavil.   # Follow-up with me 6 weeks port flush/no labs.   No matching staging information was found for the patient.  Cammie Sickle, MD   03/29/2016 5:39 PM

## 2016-03-29 NOTE — Progress Notes (Signed)
Pt reports numbness in fingers and toes.  Pt would like to know if she should take gabapentin with her depression medication.

## 2016-04-01 ENCOUNTER — Ambulatory Visit
Admission: RE | Admit: 2016-04-01 | Discharge: 2016-04-01 | Disposition: A | Discharge status: Home / Self Care | Payer: Medicare Other | Source: Ambulatory Visit | Attending: Radiation Oncology | Admitting: Radiation Oncology

## 2016-04-01 ENCOUNTER — Inpatient Hospital Stay: Payer: Medicare Other

## 2016-04-01 DIAGNOSIS — C50911 Malignant neoplasm of unspecified site of right female breast: Secondary | ICD-10-CM | POA: Diagnosis not present

## 2016-04-02 ENCOUNTER — Ambulatory Visit
Admission: RE | Admit: 2016-04-02 | Discharge: 2016-04-02 | Disposition: A | Discharge status: Home / Self Care | Payer: Medicare Other | Source: Ambulatory Visit | Attending: Radiation Oncology | Admitting: Radiation Oncology

## 2016-04-02 ENCOUNTER — Other Ambulatory Visit: Payer: Self-pay | Admitting: Family Medicine

## 2016-04-02 DIAGNOSIS — C50911 Malignant neoplasm of unspecified site of right female breast: Secondary | ICD-10-CM | POA: Diagnosis not present

## 2016-04-02 DIAGNOSIS — E785 Hyperlipidemia, unspecified: Secondary | ICD-10-CM

## 2016-04-02 DIAGNOSIS — Z0000 Encounter for general adult medical examination without abnormal findings: Secondary | ICD-10-CM

## 2016-04-03 ENCOUNTER — Ambulatory Visit
Admission: RE | Admit: 2016-04-03 | Discharge: 2016-04-03 | Disposition: A | Discharge status: Home / Self Care | Payer: Medicare Other | Source: Ambulatory Visit | Attending: Radiation Oncology | Admitting: Radiation Oncology

## 2016-04-03 DIAGNOSIS — C50911 Malignant neoplasm of unspecified site of right female breast: Secondary | ICD-10-CM | POA: Diagnosis not present

## 2016-04-04 ENCOUNTER — Ambulatory Visit
Admission: RE | Admit: 2016-04-04 | Discharge: 2016-04-04 | Disposition: A | Discharge status: Home / Self Care | Payer: Medicare Other | Source: Ambulatory Visit | Attending: Radiation Oncology | Admitting: Radiation Oncology

## 2016-04-04 DIAGNOSIS — C50911 Malignant neoplasm of unspecified site of right female breast: Secondary | ICD-10-CM | POA: Diagnosis not present

## 2016-04-05 ENCOUNTER — Ambulatory Visit
Admission: RE | Admit: 2016-04-05 | Discharge: 2016-04-05 | Disposition: A | Discharge status: Home / Self Care | Payer: Medicare Other | Source: Ambulatory Visit | Attending: Radiation Oncology | Admitting: Radiation Oncology

## 2016-04-05 DIAGNOSIS — C50911 Malignant neoplasm of unspecified site of right female breast: Secondary | ICD-10-CM | POA: Diagnosis not present

## 2016-04-09 ENCOUNTER — Other Ambulatory Visit: Payer: Medicare Other

## 2016-04-09 ENCOUNTER — Other Ambulatory Visit: Payer: Self-pay | Admitting: *Deleted

## 2016-04-09 ENCOUNTER — Ambulatory Visit
Admission: RE | Admit: 2016-04-09 | Discharge: 2016-04-09 | Disposition: A | Discharge status: Home / Self Care | Payer: Medicare Other | Source: Ambulatory Visit | Attending: Radiation Oncology | Admitting: Radiation Oncology

## 2016-04-09 DIAGNOSIS — C50911 Malignant neoplasm of unspecified site of right female breast: Secondary | ICD-10-CM | POA: Diagnosis not present

## 2016-04-09 DIAGNOSIS — G620 Drug-induced polyneuropathy: Secondary | ICD-10-CM

## 2016-04-09 DIAGNOSIS — T451X5A Adverse effect of antineoplastic and immunosuppressive drugs, initial encounter: Principal | ICD-10-CM

## 2016-04-09 MED ORDER — AMITRIPTYLINE HCL 25 MG PO TABS
25.0000 mg | ORAL_TABLET | Freq: Every day | ORAL | 0 refills | Status: DC
Start: 2016-04-09 — End: 2016-05-10

## 2016-04-10 ENCOUNTER — Ambulatory Visit
Admission: RE | Admit: 2016-04-10 | Discharge: 2016-04-10 | Disposition: A | Discharge status: Home / Self Care | Payer: Medicare Other | Source: Ambulatory Visit | Attending: Radiation Oncology | Admitting: Radiation Oncology

## 2016-04-10 ENCOUNTER — Other Ambulatory Visit: Payer: Self-pay | Admitting: *Deleted

## 2016-04-10 DIAGNOSIS — C50911 Malignant neoplasm of unspecified site of right female breast: Secondary | ICD-10-CM | POA: Diagnosis not present

## 2016-04-15 ENCOUNTER — Ambulatory Visit (INDEPENDENT_AMBULATORY_CARE_PROVIDER_SITE_OTHER): Payer: Medicare Other | Admitting: Family Medicine

## 2016-04-15 ENCOUNTER — Encounter: Payer: Self-pay | Admitting: Family Medicine

## 2016-04-15 VITALS — BP 110/70 | HR 97 | Temp 98.2°F | Ht 61.75 in | Wt 124.2 lb

## 2016-04-15 DIAGNOSIS — C50211 Malignant neoplasm of upper-inner quadrant of right female breast: Secondary | ICD-10-CM | POA: Diagnosis not present

## 2016-04-15 DIAGNOSIS — E785 Hyperlipidemia, unspecified: Secondary | ICD-10-CM

## 2016-04-15 DIAGNOSIS — Z0000 Encounter for general adult medical examination without abnormal findings: Secondary | ICD-10-CM

## 2016-04-15 DIAGNOSIS — I10 Essential (primary) hypertension: Secondary | ICD-10-CM

## 2016-04-15 DIAGNOSIS — R634 Abnormal weight loss: Secondary | ICD-10-CM

## 2016-04-15 LAB — LIPID PANEL
CHOL/HDL RATIO: 3
Cholesterol: 184 mg/dL (ref 0–200)
HDL: 72.3 mg/dL (ref >39.00)
LDL CALC: 102 mg/dL — AB (ref 0–99)
NonHDL: 111.99
TRIGLYCERIDES: 52 mg/dL (ref 0.0–149.0)
VLDL: 10.4 mg/dL (ref 0.0–40.0)

## 2016-04-15 LAB — TSH: TSH: 1.56 u[IU]/mL (ref 0.35–4.50)

## 2016-04-15 MED ORDER — AMLODIPINE BESYLATE 5 MG PO TABS: 5.0000 mg | ORAL_TABLET | Freq: Every day | ORAL | 2 refills | Status: DC

## 2016-04-15 NOTE — Assessment & Plan Note (Signed)
Followed by oncology. Plan is to start arimidex.

## 2016-04-15 NOTE — Patient Instructions (Signed)
Great to see you. Keep eating and say hi to your husband for me. We will call you with your lab results and you can view them online.

## 2016-04-15 NOTE — Assessment & Plan Note (Signed)
Improved. Appetite improving. Marinol d/c'd

## 2016-04-15 NOTE — Assessment & Plan Note (Signed)
The patients weight, height, BMI and visual acuity have been recorded in the chart.  Cognitive function assessed.   I have made referrals, counseling and provided education to the patient based review of the above and I have provided the pt with a written personalized care plan for preventive services.  

## 2016-04-15 NOTE — Progress Notes (Signed)
Pre visit review using our clinic review tool, if applicable. No additional management support is needed unless otherwise documented below in the visit note. 

## 2016-04-15 NOTE — Progress Notes (Signed)
Subjective:   Patient ID: Sandra [name], female    DOB: October 30, 1945, 70 y.o.   MRN: [id number]  Sandra [name] is a pleasant 70 y.o. year old female who presents to clinic today with Annual Exam (Medicare)  and follow up of chronic medical conditions on 04/15/2016  HPI:  I have personally reviewed the Medicare Annual Wellness questionnaire and have noted 1. The patient's medical and social history 2. Their use of alcohol, tobacco or illicit drugs 3. Their current medications and supplements 4. The patient's functional ability including ADL's, fall risks, home safety risks and hearing or visual             impairment. 5. Diet and physical activities 6. Evidence for depression or mood disorders  End of life wishes discussed and updated in Social History.  The roster of all physicians providing medical care to patient - is listed in the CareTeams section of the chart.   colonoscopy in 06/21/14 -Dr. Hilarie Fredrickson- reviewed- polyps Remote h/o hysterectomy Zostavax 01/15/12 Mammogram 01/02/16 Pneumovax 01/15/12 Prevnar 13 04/06/14  Right breast cancer- last seen by Dr. Rogue Bussing on 03/29/16.  Note reviewed. Plan is to start Arimedex at next OV (6 weeks).  Also has some neuropathy.  When I saw her on 02/27/16, she was complaining of decreased appetite and weight loss during cancer tx.  Started Marinol 2.5 mg twice daily which she didn't start due to cost.  Appetite has improved and has regained some weight.  Wt Readings from Last 3 Encounters:  04/15/16 124 lb 4 oz (56.4 kg)  03/29/16 120 lb 9.6 oz (54.7 kg)  02/27/16 116 lb 8 oz (52.8 kg)     HTN- now on HCTZ 25 mg daily and norvasc.  Denies any CP, SOB or LE edema. No blurred vision. Lab Results  Component Value Date   CREATININE 0.67 03/29/2016     HLD- on Pravastatin 20 mg. Denies any myalgias.   Lab Results  Component Value Date   CHOL 138 04/05/2015   HDL 59.70 04/05/2015   LDLCALC 72 04/05/2015    TRIG 31.0 04/05/2015   CHOLHDL 2 04/05/2015   Lab Results  Component Value Date   ALT 14 03/29/2016   AST 24 03/29/2016   ALKPHOS 43 03/29/2016   BILITOT 0.7 03/29/2016   Lab Results  Component Value Date   WBC 2.7 (L) 03/29/2016   HGB 12.4 03/29/2016   HCT 35.8 03/29/2016   MCV 85.5 03/29/2016   PLT 288 03/29/2016   Lab Results  Component Value Date   TSH 0.97 07/26/2015     Current Outpatient Prescriptions on File Prior to Visit  Medication Sig Dispense Refill  . amitriptyline (ELAVIL) 25 MG tablet Take 1 tablet (25 mg total) by mouth at bedtime. 30 tablet 0  . cholecalciferol (VITAMIN D) 1000 units tablet Take 1,000 Units by mouth daily.    Marland Kitchen gabapentin (NEURONTIN) 300 MG capsule Take 2 Caps in morning and evening and 1 capsule midday. 150 capsule 1  . hydrochlorothiazide (HYDRODIURIL) 25 MG tablet Take 1 tablet (25 mg total) by mouth daily. COMPLETE PHYSICAL EXAM REQUIRED FOR ADDITIONAL REFILLS 90 tablet 0  . HYDROcodone-acetaminophen (NORCO/VICODIN) 5-325 MG tablet 1 pill every 8 hours as needed for pain 40 tablet 0  . lidocaine-prilocaine (EMLA) cream Apply to port site 1-2 hours prior to port being accessed.  Cover with plastic wrap. 1 each 3  . ondansetron (ZOFRAN) 4 MG tablet Take 1 tablet (4 mg total) by mouth every  6 (six) hours as needed. 30 tablet 3  . Potassium Chloride ER 20 MEQ TBCR Take 1 tablet by mouth 2 (two) times daily. 60 tablet 0  . pravastatin (PRAVACHOL) 10 MG tablet TAKE ONE TABLET BY MOUTH ONCE DAILY 90 tablet 1   No current facility-administered medications on file prior to visit.     Allergies  Allergen Reactions  . Lovastatin Anaphylaxis    Tongue swelling  . Cymbalta [Duloxetine Hcl]     glaucoma    Past Medical History:  Diagnosis Date  . Cancer ([place])   . Hyperlipidemia   . Hypertension   . Visual blurriness    SOMETHING NEW THAT HAS DEVELOPED SINCE 07-13-15 SURGERY- SEEN AT [facility] ENT AND EXAM WAS NORMAL PER PT (08-09-15)     Past Surgical History:  Procedure Laterality Date  . ABDOMINAL HYSTERECTOMY  1987  . BREAST BIOPSY Right    neg  . BREAST BIOPSY Left    4 oclock, FIBROEPITHELIAL LESION FAVOR CELLULAR FIBROADENOMA  . BREAST BIOPSY Left    2 oclock, CYSTIC APOCRINE METAPLASIA  . BREAST BIOPSY Right 07/13/15   130 INVASIVE MAMMARY CARCINOMA, NO SPECIAL TYPE  . BREAST LUMPECTOMY Left 07/13/2015   Procedure: BREAST LUMPECTOMY;  Surgeon: Christene Lye, MD;  Location: [facility] ORS;  Service: General;  Laterality: Left;  . BREAST LUMPECTOMY WITH SENTINEL LYMPH NODE BIOPSY Right 07/13/2015   Procedure: BREAST LUMPECTOMY WITH SENTINEL LYMPH NODE BX;  Surgeon: Christene Lye, MD;  Location: [facility] ORS;  Service: General;  Laterality: Right;  . BUNIONECTOMY Right 1993  . fatty tumor Left 2013   side  . PORTACATH PLACEMENT Left 08/16/2015   Procedure: INSERTION PORT-A-CATH;  Surgeon: Christene Lye, MD;  Location: [facility] ORS;  Service: General;  Laterality: Left;    Family History  Problem Relation Age of Onset  . Cancer Sister 43    breast cancer  . Breast cancer Sister 3  . Colon cancer Neg Hx     Social History   Social History  . Marital status: Married    Spouse name: N/A  . Number of children: N/A  . Years of education: N/A   Occupational History  . Not on file.   Social History Main Topics  . Smoking status: Never Smoker  . Smokeless tobacco: Never Used  . Alcohol use No  . Drug use: No  . Sexual activity: Not on file   Other Topics Concern  . Not on file   Social History Narrative   End of life wishes (updated 01/2013)-   Daughter of wishes (Cherlyl Duwayne Heck)      Desires CPR.   Desires life support if reasonable.            The PMH, PSH, Social History, Family History, Medications, and allergies have been reviewed in [facility], and have been updated if relevant.   Review of Systems  Constitutional: Negative.   HENT: Negative.   Respiratory: Negative.    Cardiovascular: Negative.   Gastrointestinal: Negative.   Endocrine: Negative.   Genitourinary: Negative.   Musculoskeletal: Negative.   Skin: Positive for color change.  Neurological: Negative.   Hematological: Negative.   Psychiatric/Behavioral: Negative.   All other systems reviewed and are negative.      Objective:    BP 110/70   Pulse 97   Temp 98.2 F (36.8 C) (Oral)   Ht 5' 1.75" (1.568 m)   Wt 124 lb 4 oz (56.4 kg)   LMP  (LMP Unknown)  SpO2 98%   BMI 22.91 kg/m    Physical Exam  Constitutional: She is oriented to person, place, and time. She appears well-developed and well-nourished. No distress.  HENT:  Head: Normocephalic.  Eyes: Conjunctivae are normal.  Neck: Normal range of motion.  Cardiovascular: Normal rate.   Pulmonary/Chest: Effort normal.  Abdominal: Soft. Bowel sounds are normal. She exhibits no distension. There is no tenderness. There is no rebound. Hernia confirmed negative in the right inguinal area and confirmed negative in the left inguinal area.  Genitourinary: Vagina normal. No breast swelling, tenderness, discharge or bleeding. No labial fusion. There is no rash, tenderness, lesion or injury on the right labia. There is no rash, tenderness, lesion or injury on the left labia. Right adnexum displays no mass, no tenderness and no fullness. Left adnexum displays no mass, no tenderness and no fullness.  Musculoskeletal: Normal range of motion.  Lymphadenopathy:       Right: No inguinal adenopathy present.       Left: No inguinal adenopathy present.  Neurological: She is alert and oriented to person, place, and time. No cranial nerve deficit.  Skin: Skin is warm and dry.  Psychiatric: She has a normal mood and affect. Her behavior is normal. Judgment and thought content normal.  Nursing note and vitals reviewed.         Assessment & Plan:   Essential hypertension  Hyperlipidemia  Medicare annual wellness visit, subsequent  Breast  cancer of upper-inner quadrant of right female breast ([place])  Unintentional weight loss No Follow-up on file.

## 2016-04-15 NOTE — Addendum Note (Signed)
Addended by: Marchia Bond on: 04/15/2016 09:57 AM   Modules accepted: Orders

## 2016-04-15 NOTE — Assessment & Plan Note (Signed)
Well controlled.  No changes made. 

## 2016-04-15 NOTE — Assessment & Plan Note (Signed)
Continue statin. Due for labs today.

## 2016-04-16 ENCOUNTER — Encounter: Payer: Self-pay | Admitting: *Deleted

## 2016-04-29 ENCOUNTER — Telehealth: Payer: Self-pay | Admitting: *Deleted

## 2016-04-29 NOTE — Telephone Encounter (Signed)
We can certainly stop it to see if helps but I would want to recheck her cholesterol in a couple of months.

## 2016-04-29 NOTE — Telephone Encounter (Signed)
Patient left a voicemail stating that she has been seeing a chiropractor/wellness doctor for her neuropathy. Patient stated that the chiropractor wants to know if she can stop her cholesterol medication because of her neuropathy?

## 2016-04-29 NOTE — Telephone Encounter (Signed)
Left detailed VM.  

## 2016-05-01 NOTE — Telephone Encounter (Signed)
Please close encounter if completed,

## 2016-05-07 ENCOUNTER — Encounter: Payer: Self-pay | Admitting: Primary Care

## 2016-05-07 ENCOUNTER — Ambulatory Visit (INDEPENDENT_AMBULATORY_CARE_PROVIDER_SITE_OTHER): Payer: Medicare Other | Admitting: Primary Care

## 2016-05-07 VITALS — BP 140/84 | HR 90 | Temp 98.0°F | Ht 61.75 in | Wt 121.8 lb

## 2016-05-07 DIAGNOSIS — H6123 Impacted cerumen, bilateral: Secondary | ICD-10-CM | POA: Diagnosis not present

## 2016-05-07 DIAGNOSIS — H9313 Tinnitus, bilateral: Secondary | ICD-10-CM

## 2016-05-07 NOTE — Patient Instructions (Signed)
You may try Debrox drops for any future ear wax buildup. Take a look at the information below.  It was a pleasure meeting you!  Cerumen Impaction The structures of the external ear canal secrete a waxy substance known as cerumen. Excess cerumen can build up in the ear canal, causing a condition known as cerumen impaction. Cerumen impaction can cause ear pain and disrupt the function of the ear. The rate of cerumen production differs for each individual. In certain individuals, the configuration of the ear canal may decrease his or her ability to naturally remove cerumen. CAUSES Cerumen impaction is caused by excessive cerumen production or buildup. RISK FACTORS  Frequent use of swabs to clean ears.  Having narrow ear canals.  Having eczema.  Being dehydrated. SIGNS AND SYMPTOMS  Diminished hearing.  Ear drainage.  Ear pain.  Ear itch. TREATMENT Treatment may involve:  Over-the-counter or prescription ear drops to soften the cerumen.  Removal of cerumen by a health care provider. This may be done with:  Irrigation with warm water. This is the most common method of removal.  Ear curettes and other instruments.  Surgery. This may be done in severe cases. HOME CARE INSTRUCTIONS  Take medicines only as directed by your health care provider.  Do not insert objects into the ear with the intent of cleaning the ear. PREVENTION  Do not insert objects into the ear, even with the intent of cleaning the ear. Removing cerumen as a part of normal hygiene is not necessary, and the use of swabs in the ear canal is not recommended.  Drink enough water to keep your urine clear or pale yellow.  Control your eczema if you have it. SEEK MEDICAL CARE IF:  You develop ear pain.  You develop bleeding from the ear.  The cerumen does not clear after you use ear drops as directed.   This information is not intended to replace advice given to you by your health care provider. Make sure  you discuss any questions you have with your health care provider.   Document Released: 08/29/2004 Document Revised: 08/12/2014 Document Reviewed: 03/08/2015 Elsevier Interactive Patient Education [employer].

## 2016-05-07 NOTE — Progress Notes (Signed)
Subjective:    Patient ID: Sandra [name], female    DOB: 11/02/1945, 70 y.o.   MRN: [id number]  HPI  Sandra [name] is a 70 year old female who presents today with a chief complaint of tinnitus. The sound is present to her bilateral ears and has been intermittent for the past 3 weeks. Denies exposure to loud noises, pain, fevers, cough, sinus pressure, changes in hearing. She underwent evaluation for her [place] and did overall well on the hearing test. She underwent chemotherapy from January to June this year.   Review of Systems  Constitutional: Negative for chills and fever.  HENT: Negative for congestion, ear pain and postnasal drip.   Respiratory: Negative for cough.        Past Medical History:  Diagnosis Date  . Cancer ([place])   . Hyperlipidemia   . Hypertension   . Visual blurriness    SOMETHING NEW THAT HAS DEVELOPED SINCE 07-13-15 SURGERY- SEEN AT [facility] ENT AND EXAM WAS NORMAL PER PT (08-09-15)     Social History   Social History  . Marital status: Married    Spouse name: N/A  . Number of children: N/A  . Years of education: N/A   Occupational History  . Not on file.   Social History Main Topics  . Smoking status: Never Smoker  . Smokeless tobacco: Never Used  . Alcohol use No  . Drug use: No  . Sexual activity: Not on file   Other Topics Concern  . Not on file   Social History Narrative   End of life wishes (updated 01/2013)-   Daughter of wishes (Cherlyl Duwayne Heck)      Desires CPR.   Desires life support if reasonable.             Past Surgical History:  Procedure Laterality Date  . ABDOMINAL HYSTERECTOMY  1987  . BREAST BIOPSY Right    neg  . BREAST BIOPSY Left    4 oclock, FIBROEPITHELIAL LESION FAVOR CELLULAR FIBROADENOMA  . BREAST BIOPSY Left    2 oclock, CYSTIC APOCRINE METAPLASIA  . BREAST BIOPSY Right 07/13/15   130 INVASIVE MAMMARY CARCINOMA, NO SPECIAL TYPE  . BREAST LUMPECTOMY Left 07/13/2015   Procedure: BREAST LUMPECTOMY;   Surgeon: Christene Lye, MD;  Location: [facility] ORS;  Service: General;  Laterality: Left;  . BREAST LUMPECTOMY WITH SENTINEL LYMPH NODE BIOPSY Right 07/13/2015   Procedure: BREAST LUMPECTOMY WITH SENTINEL LYMPH NODE BX;  Surgeon: Christene Lye, MD;  Location: [facility] ORS;  Service: General;  Laterality: Right;  . BUNIONECTOMY Right 1993  . fatty tumor Left 2013   side  . PORTACATH PLACEMENT Left 08/16/2015   Procedure: INSERTION PORT-A-CATH;  Surgeon: Christene Lye, MD;  Location: [facility] ORS;  Service: General;  Laterality: Left;    Family History  Problem Relation Age of Onset  . Cancer Sister 79    breast cancer  . Breast cancer Sister 53  . Colon cancer Neg Hx     Allergies  Allergen Reactions  . Lovastatin Anaphylaxis    Tongue swelling  . Cymbalta [Duloxetine Hcl]     glaucoma    Current Outpatient Prescriptions on File Prior to Visit  Medication Sig Dispense Refill  . amitriptyline (ELAVIL) 25 MG tablet Take 1 tablet (25 mg total) by mouth at bedtime. 30 tablet 0  . amLODipine (NORVASC) 5 MG tablet Take 1 tablet (5 mg total) by mouth daily. 90 tablet 2  . cholecalciferol (VITAMIN D) 1000  units tablet Take 1,000 Units by mouth daily.    Marland Kitchen gabapentin (NEURONTIN) 300 MG capsule Take 2 Caps in morning and evening and 1 capsule midday. 150 capsule 1  . hydrochlorothiazide (HYDRODIURIL) 25 MG tablet Take 1 tablet (25 mg total) by mouth daily. COMPLETE PHYSICAL EXAM REQUIRED FOR ADDITIONAL REFILLS 90 tablet 0  . HYDROcodone-acetaminophen (NORCO/VICODIN) 5-325 MG tablet 1 pill every 8 hours as needed for pain 40 tablet 0  . lidocaine-prilocaine (EMLA) cream Apply to port site 1-2 hours prior to port being accessed.  Cover with plastic wrap. 1 each 3  . ondansetron (ZOFRAN) 4 MG tablet Take 1 tablet (4 mg total) by mouth every 6 (six) hours as needed. 30 tablet 3  . Potassium Chloride ER 20 MEQ TBCR Take 1 tablet by mouth 2 (two) times daily. 60 tablet 0  . pravastatin  (PRAVACHOL) 10 MG tablet TAKE ONE TABLET BY MOUTH ONCE DAILY 90 tablet 1   No current facility-administered medications on file prior to visit.     BP 140/84   Pulse 90   Temp 98 F (36.7 C) (Oral)   Ht 5' 1.75" (1.568 m)   Wt 121 lb 12.8 oz (55.2 kg)   LMP  (LMP Unknown)   SpO2 98%   BMI 22.46 kg/m    Objective:   Physical Exam  Constitutional: She appears well-nourished.  HENT:  Right Ear: Tympanic membrane and ear canal normal.  Left Ear: Tympanic membrane and ear canal normal.  Nose: Right sinus exhibits no maxillary sinus tenderness and no frontal sinus tenderness. Left sinus exhibits no maxillary sinus tenderness and no frontal sinus tenderness.  Mouth/Throat: Oropharynx is clear and moist.  Bilateral canals with cerumen impaction. TM's post irrigation unremarkable bilaterally. Mild irritation to canals bilaterally. No infection.  Eyes: Conjunctivae are normal.  Neck: Neck supple.  Cardiovascular: Normal rate and regular rhythm.   Pulmonary/Chest: Effort normal and breath sounds normal. She has no wheezes. She has no rales.  Lymphadenopathy:    She has no cervical adenopathy.  Skin: Skin is warm and dry.          Assessment & Plan:  Tinnitus:  Located to bilateral ears intermittently x 3 weeks. No respiratory symptoms, no exposure to loud noises/music. Passed hearing exam in mid September 2017. Exam with cerumen impaction to bilateral canals. TM's post irrigation unremarkable. Mild irritation to canals bilaterally. No infection. Discussed use of Debrox drops in the future. Follow up PRN.  Sheral Flow, NP

## 2016-05-07 NOTE — Progress Notes (Signed)
Pre visit review using our clinic review tool, if applicable. No additional management support is needed unless otherwise documented below in the visit note. 

## 2016-05-10 ENCOUNTER — Inpatient Hospital Stay: Payer: Medicare Other | Attending: Internal Medicine | Admitting: Internal Medicine

## 2016-05-10 ENCOUNTER — Inpatient Hospital Stay: Payer: Medicare Other

## 2016-05-10 ENCOUNTER — Inpatient Hospital Stay (HOSPITAL_BASED_OUTPATIENT_CLINIC_OR_DEPARTMENT_OTHER): Payer: Medicare Other

## 2016-05-10 VITALS — BP 112/78 | HR 65 | Temp 97.8°F | Resp 18 | Ht 61.75 in | Wt 121.6 lb

## 2016-05-10 DIAGNOSIS — Z170 Estrogen receptor positive status [ER+]: Secondary | ICD-10-CM | POA: Insufficient documentation

## 2016-05-10 DIAGNOSIS — C50211 Malignant neoplasm of upper-inner quadrant of right female breast: Secondary | ICD-10-CM | POA: Insufficient documentation

## 2016-05-10 DIAGNOSIS — I10 Essential (primary) hypertension: Secondary | ICD-10-CM | POA: Insufficient documentation

## 2016-05-10 DIAGNOSIS — D72819 Decreased white blood cell count, unspecified: Secondary | ICD-10-CM | POA: Insufficient documentation

## 2016-05-10 DIAGNOSIS — Z923 Personal history of irradiation: Secondary | ICD-10-CM | POA: Insufficient documentation

## 2016-05-10 DIAGNOSIS — E785 Hyperlipidemia, unspecified: Secondary | ICD-10-CM | POA: Insufficient documentation

## 2016-05-10 DIAGNOSIS — Z79899 Other long term (current) drug therapy: Secondary | ICD-10-CM | POA: Diagnosis not present

## 2016-05-10 DIAGNOSIS — E876 Hypokalemia: Secondary | ICD-10-CM

## 2016-05-10 DIAGNOSIS — Z803 Family history of malignant neoplasm of breast: Secondary | ICD-10-CM | POA: Diagnosis not present

## 2016-05-10 DIAGNOSIS — Z800 Family history of malignant neoplasm of digestive organs: Secondary | ICD-10-CM | POA: Diagnosis not present

## 2016-05-10 DIAGNOSIS — Z79811 Long term (current) use of aromatase inhibitors: Secondary | ICD-10-CM | POA: Diagnosis not present

## 2016-05-10 DIAGNOSIS — G629 Polyneuropathy, unspecified: Secondary | ICD-10-CM | POA: Insufficient documentation

## 2016-05-10 DIAGNOSIS — Z9221 Personal history of antineoplastic chemotherapy: Secondary | ICD-10-CM | POA: Diagnosis not present

## 2016-05-10 DIAGNOSIS — Z95828 Presence of other vascular implants and grafts: Secondary | ICD-10-CM

## 2016-05-10 LAB — CBC WITH DIFFERENTIAL/PLATELET
BASOS ABS: 0.1 10*3/uL (ref 0–0.1)
BASOS PCT: 2 %
EOS ABS: 0.2 10*3/uL (ref 0–0.7)
EOS PCT: 7 %
HCT: 33.7 % — ABNORMAL LOW (ref 35.0–47.0)
Hemoglobin: 11.8 g/dL — ABNORMAL LOW (ref 12.0–16.0)
Lymphocytes Relative: 39 %
Lymphs Abs: 0.9 10*3/uL — ABNORMAL LOW (ref 1.0–3.6)
MCH: 29 pg (ref 26.0–34.0)
MCHC: 34.8 g/dL (ref 32.0–36.0)
MCV: 83.1 fL (ref 80.0–100.0)
MONO ABS: 0.3 10*3/uL (ref 0.2–0.9)
Monocytes Relative: 12 %
Neutro Abs: 1 10*3/uL — ABNORMAL LOW (ref 1.4–6.5)
Neutrophils Relative %: 40 %
PLATELETS: 292 10*3/uL (ref 150–440)
RBC: 4.06 MIL/uL (ref 3.80–5.20)
RDW: 14 % (ref 11.5–14.5)
WBC: 2.4 10*3/uL — AB (ref 3.6–11.0)

## 2016-05-10 LAB — COMPREHENSIVE METABOLIC PANEL
ALT: 21 U/L (ref 14–54)
AST: 26 U/L (ref 15–41)
Albumin: 4 g/dL (ref 3.5–5.0)
Alkaline Phosphatase: 45 U/L (ref 38–126)
Anion gap: 5 (ref 5–15)
BUN: 7 mg/dL (ref 6–20)
CALCIUM: 9 mg/dL (ref 8.9–10.3)
CO2: 27 mmol/L (ref 22–32)
CREATININE: 0.73 mg/dL (ref 0.44–1.00)
Chloride: 99 mmol/L — ABNORMAL LOW (ref 101–111)
GFR calc Af Amer: >60 mL/min (ref >60)
GLUCOSE: 106 mg/dL — AB (ref 65–99)
POTASSIUM: 3.3 mmol/L — AB (ref 3.5–5.1)
SODIUM: 131 mmol/L — AB (ref 135–145)
TOTAL PROTEIN: 6.6 g/dL (ref 6.5–8.1)
Total Bilirubin: 0.7 mg/dL (ref 0.3–1.2)

## 2016-05-10 MED ORDER — HEPARIN SOD (PORK) LOCK FLUSH 100 UNIT/ML IV SOLN
INTRAVENOUS | Status: AC
  Filled 2016-05-10: qty 5

## 2016-05-10 MED ORDER — SODIUM CHLORIDE 0.9% FLUSH
10.0000 mL | Freq: Once | INTRAVENOUS | Status: AC
  Administered 2016-05-10: 10 mL via INTRAVENOUS
  Filled 2016-05-10: qty 10

## 2016-05-10 MED ORDER — ANASTROZOLE 1 MG PO TABS: 1.0000 mg | ORAL_TABLET | Freq: Every day | ORAL | 3 refills | Status: DC

## 2016-05-10 MED ORDER — HEPARIN SOD (PORK) LOCK FLUSH 100 UNIT/ML IV SOLN
500.0000 [IU] | Freq: Once | INTRAVENOUS | Status: AC
Start: 2016-05-10 — End: 2016-05-10
  Administered 2016-05-10: 500 [IU] via INTRAVENOUS

## 2016-05-10 NOTE — Assessment & Plan Note (Addendum)
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 Currently finished adjuvant chemotherapy. No clinical evidence of recurrence.  S/p RT.   # Recommend aromatase inhibitor x5 years; discussed the mechanism of action; potential side effects including but not limited to hot flashes arthritis and osteoporosis. Recommend a bone density  # Hypokalemia- 3.3. Improving.   # mild leucopenia- monitor for now.   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   # Port explantation- with Dr.sankar.   # follow up in 2 months/ BMD prior./labs.

## 2016-05-10 NOTE — Progress Notes (Signed)
[place] @ [facility] [phone]  [phone]   INITIAL CONSULT  Sandra [name] OB: 08/11/1945  MR#: [id number]  WGY#:[id number]  Patient Care Team: Lucille Passy, MD as PCP - General (Family Medicine) Glennie Isle, PA-C (Physician Assistant) Jerene Bears, MD as Consulting Physician (Gastroenterology) Lucille Passy, MD as Consulting Physician (Family Medicine) Seeplaputhur Robinette Haines, MD (General Surgery) Cammie Sickle, MD as Consulting Physician (Internal Medicine) Leona Singleton, RN as Oncology Nurse Navigator  CHIEF COMPLAINT:  Chief Complaint  Patient presents with  . Breast cancer of upper-inner quadrant of right female breast   VISIT DIAGNOSIS:     ICD-9-CM ICD-10-CM   1. Carcinoma of upper-inner quadrant of right breast in female, estrogen receptor positive ([place]) 174.2 C50.211 anastrozole (ARIMIDEX) 1 MG tablet   V86.0 Z17.0 DG Bone Density     Ambulatory referral to General Surgery  2. Port-a-cath in place V45.89 Z95.828 anastrozole (ARIMIDEX) 1 MG tablet     DG Bone Density     Ambulatory referral to General Surgery     Oncology History    carcinoma breast (right) inner and upper quadrant T1 cN0 M0 tumor Estrogen receptor positive progesterone receptor positive HER-2 receptor negative by fish Mammo print shows high risk (diagnosis in January of 2016) patient has 22% average risk in 5 years and 29% average risk in 10 years for distant metastectomy disease without chemotherapy on anti-hormonal treatment 2.  Patient started Cytoxan and Adriamycin on now August 30, 2015 MUGA scan shows ejection fraction of baseline 55%.  3.Patient has finished total of 4 cycles of chemotherapy on November 01, 2015 4.  Started on the Taxol from November 22, 2015; FINISHED RT [sep 2017]  # OCT 2017- START ARIMIDEX     Carcinoma of upper-inner quadrant of right breast in female, estrogen receptor positive ([place])     INTERVAL HISTORY: 70 year old lady  History of stage I right-sided breast cancers/p  adjuvant chemotherapy; Followed by adjuvant radiation is here for follow-up  Patient continues to complain of tingling and numbness in her hands; she has taken herself off Neurontin; and amitriptyline. She is currently following her chiroproctor; notes to have improvement.   Patient denies any fevers or chills. Denies any nausea vomiting or constipation.   REVIEW OF SYSTEMS:  A complete 10 point review of system is done which is negative for mentioned above in history of present illness.  PAST MEDICAL HISTORY: Past Medical History:  Diagnosis Date  . Cancer ([place])   . Hyperlipidemia   . Hypertension   . Visual blurriness    SOMETHING NEW THAT HAS DEVELOPED SINCE 07-13-15 SURGERY- SEEN AT [facility] ENT AND EXAM WAS NORMAL PER PT (08-09-15)    PAST SURGICAL HISTORY: Past Surgical History:  Procedure Laterality Date  . ABDOMINAL HYSTERECTOMY  1987  . BREAST BIOPSY Right    neg  . BREAST BIOPSY Left    4 oclock, FIBROEPITHELIAL LESION FAVOR CELLULAR FIBROADENOMA  . BREAST BIOPSY Left    2 oclock, CYSTIC APOCRINE METAPLASIA  . BREAST BIOPSY Right 07/13/15   130 INVASIVE MAMMARY CARCINOMA, NO SPECIAL TYPE  . BREAST LUMPECTOMY Left 07/13/2015   Procedure: BREAST LUMPECTOMY;  Surgeon: Christene Lye, MD;  Location: [facility] ORS;  Service: General;  Laterality: Left;  . BREAST LUMPECTOMY WITH SENTINEL LYMPH NODE BIOPSY Right 07/13/2015   Procedure: BREAST LUMPECTOMY WITH SENTINEL LYMPH NODE BX;  Surgeon: Christene Lye, MD;  Location: [facility] ORS;  Service: General;  Laterality: Right;  .  BUNIONECTOMY Right 1993  . fatty tumor Left 2013   side  . PORTACATH PLACEMENT Left 08/16/2015   Procedure: INSERTION PORT-A-CATH;  Surgeon: Kieth Brightly, MD;  Location: [facility] ORS;  Service: General;  Laterality: Left;    FAMILY HISTORY Family History  Problem Relation Age of Onset  . Cancer Sister 62    breast cancer  . Breast cancer Sister  36  . Colon cancer Neg Hx         ADVANCED DIRECTIVES:   Patient does not have any living will or healthcare power of attorney.  Information was given .  Available resources had been discussed.  We will follow-up on subsequent appointments regarding this issue HEALTH MAINTENANCE: Social History  Substance Use Topics  . Smoking status: Never Smoker  . Smokeless tobacco: Never Used  . Alcohol use No      :  Allergies  Allergen Reactions  . Lovastatin Anaphylaxis    Tongue swelling  . Cymbalta [Duloxetine Hcl]     glaucoma    Current Outpatient Prescriptions  Medication Sig Dispense Refill  . amLODipine (NORVASC) 5 MG tablet Take 1 tablet (5 mg total) by mouth daily. 90 tablet 2  . cholecalciferol (VITAMIN D) 1000 units tablet Take 1,000 Units by mouth daily.    . hydrochlorothiazide (HYDRODIURIL) 25 MG tablet Take 1 tablet (25 mg total) by mouth daily. COMPLETE PHYSICAL EXAM REQUIRED FOR ADDITIONAL REFILLS 90 tablet 0  . lidocaine-prilocaine (EMLA) cream Apply to port site 1-2 hours prior to port being accessed.  Cover with plastic wrap. 1 each 3  . anastrozole (ARIMIDEX) 1 MG tablet Take 1 tablet (1 mg total) by mouth daily. 30 tablet 3   No current facility-administered medications for this visit.     OBJECTIVE: PHYSICAL EXAM: GENERAL:  Well developed, well nourished, sitting comfortably in the exam room in no acute distress. MENTAL STATUS:  Alert and oriented to person, place and time.  ENT:  Oropharynx clear without lesion.  Tongue normal. Mucous membranes moist. Alopecia.  Examination of the nose shows redness.  No active bleeding. RESPIRATORY:  Clear to auscultation without rales, wheezes or rhonchi. Patient has a port placed on the left upper chest wall area.  No evidence of redness. CARDIOVASCULAR:  Regular rate and rhythm without murmur, rub or gallop.  ABDOMEN:  Soft, non-tender, with active bowel sounds, and no hepatosplenomegaly.  No masses. BACK:  No CVA  tenderness.  No tenderness on percussion of the back or rib cage. SKIN:  No rashes, ulcers or lesions. EXTREMITIES: No edema, no skin discoloration or tenderness.  No palpable cords. LYMPH NODES: No palpable cervical, supraclavicular, axillary or inguinal adenopathy  NEUROLOGICAL: Unremarkable. PSYCH:  Appropriate.  Vitals:   05/10/16 1119  BP: 112/78  Pulse: 65  Resp: 18  Temp: 97.8 F (36.6 C)     Body mass index is 22.42 kg/m.    ECOG FS:0 - Asymptomatic  LAB RESULTS:  Infusion on 05/10/2016  Component Date Value Ref Range Status  . Sodium 05/10/2016 131* 135 - 145 mmol/L Final  . Potassium 05/10/2016 3.3* 3.5 - 5.1 mmol/L Final  . Chloride 05/10/2016 99* 101 - 111 mmol/L Final  . CO2 05/10/2016 27  22 - 32 mmol/L Final  . Glucose, Bld 05/10/2016 106* 65 - 99 mg/dL Final  . BUN 85/99/2341 7  6 - 20 mg/dL Final  . Creatinine, Ser 05/10/2016 0.73  0.44 - 1.00 mg/dL Final  . Calcium 44/36/0165 9.0  8.9 - 10.3 mg/dL  Final  . Total Protein 05/10/2016 6.6  6.5 - 8.1 g/dL Final  . Albumin 05/10/2016 4.0  3.5 - 5.0 g/dL Final  . AST 05/10/2016 26  15 - 41 U/L Final  . ALT 05/10/2016 21  14 - 54 U/L Final  . Alkaline Phosphatase 05/10/2016 45  38 - 126 U/L Final  . Total Bilirubin 05/10/2016 0.7  0.3 - 1.2 mg/dL Final  . GFR calc non Af Amer 05/10/2016 >60  >60 mL/min Final  . GFR calc Af Amer 05/10/2016 >60  >60 mL/min Final   Comment: (NOTE) The eGFR has been calculated using the CKD EPI equation. This calculation has not been validated in all clinical situations. eGFR's persistently <60 mL/min signify possible Chronic Kidney Disease.   . Anion gap 05/10/2016 5  5 - 15 Final  . WBC 05/10/2016 2.4* 3.6 - 11.0 K/uL Final  . RBC 05/10/2016 4.06  3.80 - 5.20 MIL/uL Final  . Hemoglobin 05/10/2016 11.8* 12.0 - 16.0 g/dL Final  . HCT 05/10/2016 33.7* 35.0 - 47.0 % Final  . MCV 05/10/2016 83.1  80.0 - 100.0 fL Final  . MCH 05/10/2016 29.0  26.0 - 34.0 pg Final  . MCHC 05/10/2016  34.8  32.0 - 36.0 g/dL Final  . RDW 05/10/2016 14.0  11.5 - 14.5 % Final  . Platelets 05/10/2016 292  150 - 440 K/uL Final  . Neutrophils Relative % 05/10/2016 40  % Final  . Neutro Abs 05/10/2016 1.0* 1.4 - 6.5 K/uL Final  . Lymphocytes Relative 05/10/2016 39  % Final  . Lymphs Abs 05/10/2016 0.9* 1.0 - 3.6 K/uL Final  . Monocytes Relative 05/10/2016 12  % Final  . Monocytes Absolute 05/10/2016 0.3  0.2 - 0.9 K/uL Final  . Eosinophils Relative 05/10/2016 7  % Final  . Eosinophils Absolute 05/10/2016 0.2  0 - 0.7 K/uL Final  . Basophils Relative 05/10/2016 2  % Final  . Basophils Absolute 05/10/2016 0.1  0 - 0.1 K/uL Final     STUDIES: No results found.  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 Currently finished adjuvant chemotherapy. No clinical evidence of recurrence.  S/p RT.   # Recommend aromatase inhibitor x5 years; discussed the mechanism of action; potential side effects including but not limited to hot flashes arthritis and osteoporosis. Recommend a bone density  # Hypokalemia- 3.3. Improving.   # mild leucopenia- monitor for now.   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   # Port explantation- with Dr.sankar.   # follow up in 2 months/ BMD prior./labs.   No matching staging information was found for the patient.  Cammie Sickle, MD   05/10/2016 5:50 PM

## 2016-05-13 ENCOUNTER — Telehealth: Payer: Self-pay | Admitting: General Surgery

## 2016-05-13 NOTE — Telephone Encounter (Signed)
05-13-16 @ 10:13AM L/M FOR PT TO RETURN CALL & MAKE APPT WITH DR Jamal Collin FOR PORT REMOVAL.REF DR Brain Hilts

## 2016-05-14 ENCOUNTER — Other Ambulatory Visit: Payer: Self-pay | Admitting: General Surgery

## 2016-05-14 DIAGNOSIS — C50911 Malignant neoplasm of unspecified site of right female breast: Secondary | ICD-10-CM

## 2016-05-15 ENCOUNTER — Ambulatory Visit
Admission: RE | Admit: 2016-05-15 | Discharge: 2016-05-15 | Disposition: A | Discharge status: Home / Self Care | Payer: Medicare Other | Source: Ambulatory Visit | Attending: Radiation Oncology | Admitting: Radiation Oncology

## 2016-05-15 ENCOUNTER — Encounter: Payer: Self-pay | Admitting: Radiation Oncology

## 2016-05-15 ENCOUNTER — Inpatient Hospital Stay: Payer: Medicare Other

## 2016-05-15 ENCOUNTER — Encounter: Payer: Self-pay | Admitting: *Deleted

## 2016-05-15 VITALS — BP 146/86 | HR 77 | Temp 96.5°F | Resp 20 | Wt 122.1 lb

## 2016-05-15 DIAGNOSIS — C50211 Malignant neoplasm of upper-inner quadrant of right female breast: Secondary | ICD-10-CM

## 2016-05-15 DIAGNOSIS — Z170 Estrogen receptor positive status [ER+]: Secondary | ICD-10-CM

## 2016-05-15 NOTE — Progress Notes (Unsigned)
Survivorship Care Plan visit completed.  Treatment summary reviewed and given to patient.  ASCO answers booklet reviewed and given to patient.  CARE program and Cancer Transitions discussed with patient along with other resources cancer center offers to patients and caregivers.  Patient verbalized understanding.    

## 2016-05-15 NOTE — Progress Notes (Signed)
Radiation Oncology Follow up Note  Name: Sandra [name]   Date:   05/15/2016 MRN:  [id number] DOB: Aug 23, 1945    This 70 y.o. female presents to the clinic today for one-month follow-up status post whole breast radiation for a T1 CN 0 ER/PR positive HER-2/neu negative invasive mammary carcinoma with MammaPrint showing high risk for recurrence status post adjuvant chemotherapy with Cytoxan and Adriamycin as well as whole breast radiation.  REFERRING PROVIDER: Lucille Passy, MD  HPI: Patient is a 70 year old female now 1 month out having completed whole breast radiation to her right breast for a T1c N0 ER/PR positive HER-2/neu negative invasive mammary carcinoma. MammaPrint showed a high risk of recurrence received Cytoxan and Adriamycin. She is seen today one month out and is doing well. She has started on anastrozole tolerating that well without side effect. She scheduled for her Port-A-Cath to be removed next week. She specifically denies breast tenderness cough or bone pain..  COMPLICATIONS OF TREATMENT: none  FOLLOW UP COMPLIANCE: keeps appointments   PHYSICAL EXAM:  BP (!) 146/86   Pulse 77   Temp (!) 96.5 F (35.8 C)   Resp 20   Wt 122 lb 2.2 oz (55.4 kg)   LMP  (LMP Unknown)   BMI 22.52 kg/m  Lungs are clear to A&P cardiac examination essentially unremarkable with regular rate and rhythm. No dominant mass or nodularity is noted in either breast in 2 positions examined. Incision is well-healed. No axillary or supraclavicular adenopathy is appreciated. Cosmetic result is excellent. Right breast is somewhat contracted compared to the left although cosmetic result is still excellent. Port-A-Cath is placed in left anterior chest wall. Well-developed well-nourished patient in NAD. HEENT reveals PERLA, EOMI, discs not visualized.  Oral cavity is clear. No oral mucosal lesions are identified. Neck is clear without evidence of cervical or supraclavicular adenopathy. Lungs are  clear to A&P. Cardiac examination is essentially unremarkable with regular rate and rhythm without murmur rub or thrill. Abdomen is benign with no organomegaly or masses noted. Motor sensory and DTR levels are equal and symmetric in the upper and lower extremities. Cranial nerves II through XII are grossly intact. Proprioception is intact. No peripheral adenopathy or edema is identified. No motor or sensory levels are noted. Crude visual fields are within normal range.  RADIOLOGY RESULTS: No current films for review  PLAN: At the present time she is doing well 1 month out from radiation. I'm please were overall progress. She is start anastrozole tolerating that well without side effect. I've asked to see her back in 4-5 months for follow-up. Patient knows to call sooner with any concerns.  I would like to take this opportunity to thank you for allowing me to participate in the care of your patient.Armstead Peaks., MD

## 2016-05-21 ENCOUNTER — Ambulatory Visit (INDEPENDENT_AMBULATORY_CARE_PROVIDER_SITE_OTHER): Payer: Medicare Other | Admitting: General Surgery

## 2016-05-21 ENCOUNTER — Encounter: Payer: Self-pay | Admitting: General Surgery

## 2016-05-21 ENCOUNTER — Other Ambulatory Visit: Payer: Self-pay | Admitting: Family Medicine

## 2016-05-21 VITALS — BP 120/80 | HR 78 | Resp 12 | Ht 62.0 in | Wt 121.0 lb

## 2016-05-21 DIAGNOSIS — C50911 Malignant neoplasm of unspecified site of right female breast: Secondary | ICD-10-CM

## 2016-05-21 DIAGNOSIS — Z170 Estrogen receptor positive status [ER+]: Secondary | ICD-10-CM

## 2016-05-21 NOTE — Progress Notes (Signed)
Patient ID: Sandra [name], female   DOB: [birth year], 70 y.o.   MRN: [id number]  Chief Complaint  Patient presents with  . Procedure    port removal    HPI Sandra [name] is a 70 y.o. female here today for a left upper chest port removal, post completion of chemotherapy for right breast cancer.   I have reviewed the history of present illness with the patient.  HPI  Past Medical History:  Diagnosis Date  . Cancer ([place])   . Hyperlipidemia   . Hypertension   . Visual blurriness    SOMETHING NEW THAT HAS DEVELOPED SINCE 07-13-15 SURGERY- SEEN AT [facility] ENT AND EXAM WAS NORMAL PER PT (08-09-15)    Past Surgical History:  Procedure Laterality Date  . ABDOMINAL HYSTERECTOMY  1987  . BREAST BIOPSY Right    neg  . BREAST BIOPSY Left    4 oclock, FIBROEPITHELIAL LESION FAVOR CELLULAR FIBROADENOMA  . BREAST BIOPSY Left    2 oclock, CYSTIC APOCRINE METAPLASIA  . BREAST BIOPSY Right 07/13/15   130 INVASIVE MAMMARY CARCINOMA, NO SPECIAL TYPE  . BREAST LUMPECTOMY Left 07/13/2015   Procedure: BREAST LUMPECTOMY;  Surgeon: Christene Lye, MD;  Location: [facility] ORS;  Service: General;  Laterality: Left;  . BREAST LUMPECTOMY WITH SENTINEL LYMPH NODE BIOPSY Right 07/13/2015   Procedure: BREAST LUMPECTOMY WITH SENTINEL LYMPH NODE BX;  Surgeon: Christene Lye, MD;  Location: [facility] ORS;  Service: General;  Laterality: Right;  . BUNIONECTOMY Right 1993  . fatty tumor Left 2013   side  . PORTACATH PLACEMENT Left 08/16/2015   Procedure: INSERTION PORT-A-CATH;  Surgeon: Christene Lye, MD;  Location: [facility] ORS;  Service: General;  Laterality: Left;    Family History  Problem Relation Age of Onset  . Cancer Sister 71    breast cancer  . Breast cancer Sister 54  . Colon cancer Neg Hx     Social History Social History  Substance Use Topics  . Smoking status: Never Smoker  . Smokeless tobacco: Never Used  . Alcohol use No    Allergies  Allergen Reactions   . Lovastatin Anaphylaxis    Tongue swelling  . Cymbalta [Duloxetine Hcl]     glaucoma    Current Outpatient Prescriptions  Medication Sig Dispense Refill  . amLODipine (NORVASC) 5 MG tablet Take 1 tablet (5 mg total) by mouth daily. 90 tablet 2  . anastrozole (ARIMIDEX) 1 MG tablet Take 1 tablet (1 mg total) by mouth daily. 30 tablet 3  . cholecalciferol (VITAMIN D) 1000 units tablet Take 1,000 Units by mouth daily.    . hydrochlorothiazide (HYDRODIURIL) 25 MG tablet Take 1 tablet (25 mg total) by mouth daily. 90 tablet 2  . lidocaine-prilocaine (EMLA) cream Apply to port site 1-2 hours prior to port being accessed.  Cover with plastic wrap. 1 each 3   No current facility-administered medications for this visit.     Review of Systems Review of Systems  Constitutional: Negative.   Respiratory: Negative.   Cardiovascular: Negative.     Blood pressure 120/80, pulse 78, resp. rate 12, height 5\' 2"  (1.575 m), weight 121 lb (54.9 kg).  Physical Exam Physical Exam  Constitutional: She is oriented to person, place, and time. She appears well-developed and well-nourished.  Neurological: She is alert and oriented to person, place, and time.  Skin: Skin is dry.    Data Reviewed Request for port removal per oncologist   Assessment  Venous port removed left upper  chest as planned     Plan   Procedure- Local anesthetic 8 ml 1% Xylocaine mixed with 0.5% Marcaine  Prep- Chloro prep Description- Previous incision was reopened to remove port. Capsule was opened. Anchored stitches removed. Port along with capsule were removed. Subcutaneous tissue was closed with interrupted 4-0 vicryl. Skin was closed with subcuticular 4-0 vicryl. Steri-strips placed with benzoin . Telfa and tegaderm used as dressing.  Ice-pack provided. Advised of wound care. Follow-up as scheduled.  This information has been scribed by Gaspar Cola CMA.     Sandra [name] 05/21/2016, 4:26  PM

## 2016-05-22 ENCOUNTER — Ambulatory Visit (INDEPENDENT_AMBULATORY_CARE_PROVIDER_SITE_OTHER): Payer: Medicare Other | Admitting: Family Medicine

## 2016-05-22 ENCOUNTER — Encounter: Payer: Self-pay | Admitting: Family Medicine

## 2016-05-22 VITALS — BP 122/82 | HR 71 | Temp 98.3°F | Wt 121.5 lb

## 2016-05-22 DIAGNOSIS — H9311 Tinnitus, right ear: Secondary | ICD-10-CM

## 2016-05-22 DIAGNOSIS — H9319 Tinnitus, unspecified ear: Secondary | ICD-10-CM | POA: Insufficient documentation

## 2016-05-22 DIAGNOSIS — E7800 Pure hypercholesterolemia, unspecified: Secondary | ICD-10-CM

## 2016-05-22 LAB — LIPID PANEL
CHOLESTEROL: 194 mg/dL (ref 0–200)
HDL: 65.4 mg/dL (ref >39.00)
LDL CALC: 116 mg/dL — AB (ref 0–99)
NonHDL: 128.13
TRIGLYCERIDES: 59 mg/dL (ref 0.0–149.0)
Total CHOL/HDL Ratio: 3
VLDL: 11.8 mg/dL (ref 0.0–40.0)

## 2016-05-22 MED ORDER — FLUTICASONE PROPIONATE 50 MCG/ACT NA SUSP: 2.0000 | Freq: Every day | NASAL | 6 refills | Status: DC

## 2016-05-22 NOTE — Progress Notes (Signed)
Pre visit review using our clinic review tool, if applicable. No additional management support is needed unless otherwise documented below in the visit note. 

## 2016-05-22 NOTE — Assessment & Plan Note (Signed)
New. >15 minutes spent in face to face time with patient, >50% spent in counselling or coordination of care Discussed different medications, including norvasc and chemotherapy which can cause tinnitus.  No longer receiving chemotherapy and she has been taking norvasc for years.  Does have some signs of ETD.  Advised to try flonase- eRx sent to see if this helps with her symptoms. Can refer to ENT/audiology if symptoms persist. The patient indicates understanding of these issues and agrees with the plan.

## 2016-05-22 NOTE — Patient Instructions (Signed)
Great to see you. Try the flonase for a few days

## 2016-05-22 NOTE — Progress Notes (Signed)
Subjective:   Patient ID: Sandra [name], female    DOB: Nov 22, 1945, 70 y.o.   MRN: [id number]  Sandra [name] is a pleasant 70 y.o. year old female who presents to clinic today with Tinnitus  on 05/22/2016  HPI:  Right ear tinnitus for weeks.    No longer receiving chemotherapy- just had her port a cath removed!  For past 3 weeks, ringing only in her right ear.  No pain or hearing loss that she is aware of.  No URI symptoms but does have some seasonal allergies.  Did not have these symptoms while receiving chemotherapy.  Current Outpatient Prescriptions on File Prior to Visit  Medication Sig Dispense Refill  . amLODipine (NORVASC) 5 MG tablet Take 1 tablet (5 mg total) by mouth daily. 90 tablet 2  . anastrozole (ARIMIDEX) 1 MG tablet Take 1 tablet (1 mg total) by mouth daily. 30 tablet 3  . cholecalciferol (VITAMIN D) 1000 units tablet Take 1,000 Units by mouth daily.    . hydrochlorothiazide (HYDRODIURIL) 25 MG tablet Take 1 tablet (25 mg total) by mouth daily. 90 tablet 2  . lidocaine-prilocaine (EMLA) cream Apply to port site 1-2 hours prior to port being accessed.  Cover with plastic wrap. 1 each 3   No current facility-administered medications on file prior to visit.     Allergies  Allergen Reactions  . Lovastatin Anaphylaxis    Tongue swelling  . Cymbalta [Duloxetine Hcl]     glaucoma    Past Medical History:  Diagnosis Date  . Cancer ([place])   . Hyperlipidemia   . Hypertension   . Visual blurriness    SOMETHING NEW THAT HAS DEVELOPED SINCE 07-13-15 SURGERY- SEEN AT [facility] AND EXAM WAS NORMAL PER PT (08-09-15)    Past Surgical History:  Procedure Laterality Date  . ABDOMINAL HYSTERECTOMY  1987  . BREAST BIOPSY Right    neg  . BREAST BIOPSY Left    4 oclock, FIBROEPITHELIAL LESION FAVOR CELLULAR FIBROADENOMA  . BREAST BIOPSY Left    2 oclock, CYSTIC APOCRINE METAPLASIA  . BREAST BIOPSY Right 07/13/15   130 INVASIVE MAMMARY  CARCINOMA, NO SPECIAL TYPE  . BREAST LUMPECTOMY Left 07/13/2015   Procedure: BREAST LUMPECTOMY;  Surgeon: Christene Lye, MD;  Location: [facility] ORS;  Service: General;  Laterality: Left;  . BREAST LUMPECTOMY WITH SENTINEL LYMPH NODE BIOPSY Right 07/13/2015   Procedure: BREAST LUMPECTOMY WITH SENTINEL LYMPH NODE BX;  Surgeon: Christene Lye, MD;  Location: [facility] ORS;  Service: General;  Laterality: Right;  . BUNIONECTOMY Right 1993  . fatty tumor Left 2013   side  . PORTACATH PLACEMENT Left 08/16/2015   Procedure: INSERTION PORT-A-CATH;  Surgeon: Christene Lye, MD;  Location: [facility] ORS;  Service: General;  Laterality: Left;    Family History  Problem Relation Age of Onset  . Cancer Sister 31    breast cancer  . Breast cancer Sister 70  . Colon cancer Neg Hx     Social History   Social History  . Marital status: Married    Spouse name: N/A  . Number of children: N/A  . Years of education: N/A   Occupational History  . Not on file.   Social History Main Topics  . Smoking status: Never Smoker  . Smokeless tobacco: Never Used  . Alcohol use No  . Drug use: No  . Sexual activity: Not on file   Other Topics Concern  . Not on file   Social  History Narrative   End of life wishes (updated 01/2013)-   Daughter of wishes (Cherlyl Duwayne Heck)      Desires CPR.   Desires life support if reasonable.            The PMH, PSH, Social History, Family History, Medications, and allergies have been reviewed in [facility], and have been updated if relevant.   Review of Systems  Constitutional: Negative.   HENT: Positive for postnasal drip. Negative for congestion, dental problem, drooling, ear discharge, ear pain, facial swelling, hearing loss, nosebleeds and rhinorrhea.   Respiratory: Negative.   Cardiovascular: Negative.   Neurological: Negative.        Objective:    BP 122/82   Pulse 71   Temp 98.3 F (36.8 C) (Oral)   Wt 121 lb 8 oz (55.1 kg)   LMP  (LMP  Unknown)   SpO2 97%   BMI 22.22 kg/m    Physical Exam  Constitutional: She is oriented to person, place, and time. She appears well-developed and well-nourished. No distress.  HENT:  Head: Normocephalic and atraumatic.  Right Ear: Hearing, external ear and ear canal normal. No drainage, swelling or tenderness. Tympanic membrane is retracted. A middle ear effusion is present. No decreased hearing is noted.  Left Ear: Hearing, tympanic membrane, external ear and ear canal normal. No drainage, swelling or tenderness.  No middle ear effusion. No decreased hearing is noted.  Eyes: Conjunctivae are normal.  Cardiovascular: Normal rate.   Pulmonary/Chest: Effort normal.  Neurological: She is alert and oriented to person, place, and time. No cranial nerve deficit.  Skin: Skin is warm and dry. She is not diaphoretic.  Psychiatric: She has a normal mood and affect. Her behavior is normal. Judgment and thought content normal.  Nursing note and vitals reviewed.         Assessment & Plan:   Pure hypercholesterolemia - Plan: Lipid panel, Lipid panel  Tinnitus of right ear No Follow-up on file.

## 2016-05-23 ENCOUNTER — Encounter: Payer: Self-pay | Admitting: *Deleted

## 2016-05-23 NOTE — Progress Notes (Signed)
Lm on pts vm requesting a call back. LEtter mailed to pt indicating results

## 2016-05-28 ENCOUNTER — Other Ambulatory Visit: Payer: Medicare Other

## 2016-06-03 ENCOUNTER — Encounter: Payer: Self-pay | Admitting: Internal Medicine

## 2016-06-24 ENCOUNTER — Other Ambulatory Visit: Payer: Self-pay | Admitting: General Surgery

## 2016-06-24 ENCOUNTER — Ambulatory Visit
Admission: RE | Admit: 2016-06-24 | Discharge: 2016-06-24 | Disposition: A | Discharge status: Home / Self Care | Payer: Medicare Other | Source: Ambulatory Visit | Attending: General Surgery | Admitting: General Surgery

## 2016-06-24 DIAGNOSIS — C50911 Malignant neoplasm of unspecified site of right female breast: Secondary | ICD-10-CM

## 2016-06-26 ENCOUNTER — Encounter: Payer: Self-pay | Admitting: *Deleted

## 2016-07-01 ENCOUNTER — Ambulatory Visit
Admission: RE | Admit: 2016-07-01 | Discharge: 2016-07-01 | Disposition: A | Discharge status: Home / Self Care | Payer: Medicare Other | Source: Ambulatory Visit | Attending: Internal Medicine | Admitting: Internal Medicine

## 2016-07-01 DIAGNOSIS — C50211 Malignant neoplasm of upper-inner quadrant of right female breast: Secondary | ICD-10-CM | POA: Insufficient documentation

## 2016-07-01 DIAGNOSIS — Z170 Estrogen receptor positive status [ER+]: Secondary | ICD-10-CM | POA: Insufficient documentation

## 2016-07-01 DIAGNOSIS — M85851 Other specified disorders of bone density and structure, right thigh: Secondary | ICD-10-CM | POA: Diagnosis not present

## 2016-07-01 DIAGNOSIS — Z95828 Presence of other vascular implants and grafts: Secondary | ICD-10-CM | POA: Insufficient documentation

## 2016-07-02 ENCOUNTER — Encounter: Payer: Self-pay | Admitting: General Surgery

## 2016-07-02 ENCOUNTER — Ambulatory Visit (INDEPENDENT_AMBULATORY_CARE_PROVIDER_SITE_OTHER): Payer: Medicare Other | Admitting: General Surgery

## 2016-07-02 VITALS — BP 116/62 | HR 70 | Resp 12 | Ht 61.0 in | Wt 121.0 lb

## 2016-07-02 DIAGNOSIS — C50911 Malignant neoplasm of unspecified site of right female breast: Secondary | ICD-10-CM

## 2016-07-02 DIAGNOSIS — Z170 Estrogen receptor positive status [ER+]: Secondary | ICD-10-CM

## 2016-07-02 NOTE — Patient Instructions (Addendum)
Patient will be asked to return to the office in one year with a bilateral diagnotic  mammogram. 

## 2016-07-02 NOTE — Progress Notes (Signed)
Patient ID: Sandra [name], female   DOB: [birth year], 70 y.o.   MRN: [id number]  Chief Complaint  Patient presents with  . Follow-up    mammogram    HPI Sandra [name] is a 70 y.o. female who presents for a breast evaluation, hx of right breast cancer. The most recent mammogram was done on 06/24/16. Bone density done 07/01/2016. Tolerating Arimidex well. Patient does perform regular self breast checks and gets regular mammograms done.   I have reviewed the history of present illness with the patient.   HPI  Past Medical History:  Diagnosis Date  . Cancer ([place])   . Hyperlipidemia   . Hypertension   . Visual blurriness    SOMETHING NEW THAT HAS DEVELOPED SINCE 07-13-15 SURGERY- SEEN AT [facility] AND EXAM WAS NORMAL PER PT (08-09-15)    Past Surgical History:  Procedure Laterality Date  . ABDOMINAL HYSTERECTOMY  1987  . BREAST BIOPSY Right    neg  . BREAST BIOPSY Left    4 oclock, FIBROEPITHELIAL LESION FAVOR CELLULAR FIBROADENOMA  . BREAST BIOPSY Left    2 oclock, CYSTIC APOCRINE METAPLASIA  . BREAST BIOPSY Right 07/13/15   130 INVASIVE MAMMARY CARCINOMA, NO SPECIAL TYPE  . BREAST LUMPECTOMY Left 07/13/2015   Procedure: BREAST LUMPECTOMY;  Surgeon: Christene Lye, MD;  Location: [facility] ORS;  Service: General;  Laterality: Left;  . BREAST LUMPECTOMY WITH SENTINEL LYMPH NODE BIOPSY Right 07/13/2015   Procedure: BREAST LUMPECTOMY WITH SENTINEL LYMPH NODE BX;  Surgeon: Christene Lye, MD;  Location: [facility] ORS;  Service: General;  Laterality: Right;  . BUNIONECTOMY Right 1993  . fatty tumor Left 2013   side  . PORTACATH PLACEMENT Left 08/16/2015   Procedure: INSERTION PORT-A-CATH;  Surgeon: Christene Lye, MD;  Location: [facility] ORS;  Service: General;  Laterality: Left;    Family History  Problem Relation Age of Onset  . Cancer Sister 75    breast cancer  . Breast cancer Sister 54  . Colon cancer Neg Hx     Social History Social History   Substance Use Topics  . Smoking status: Never Smoker  . Smokeless tobacco: Never Used  . Alcohol use No    Allergies  Allergen Reactions  . Lovastatin Anaphylaxis    Tongue swelling  . Cymbalta [Duloxetine Hcl]     glaucoma    Current Outpatient Prescriptions  Medication Sig Dispense Refill  . amLODipine (NORVASC) 5 MG tablet Take 1 tablet (5 mg total) by mouth daily. 90 tablet 2  . anastrozole (ARIMIDEX) 1 MG tablet Take 1 tablet (1 mg total) by mouth daily. 30 tablet 3  . cholecalciferol (VITAMIN D) 1000 units tablet Take 1,000 Units by mouth daily.    . fluticasone (FLONASE) 50 MCG/ACT nasal spray Place 2 sprays into both nostrils daily. 16 g 6  . hydrochlorothiazide (HYDRODIURIL) 25 MG tablet Take 1 tablet (25 mg total) by mouth daily. 90 tablet 2  . lidocaine-prilocaine (EMLA) cream Apply to port site 1-2 hours prior to port being accessed.  Cover with plastic wrap. 1 each 3   No current facility-administered medications for this visit.     Review of Systems Review of Systems  Constitutional: Negative.   Respiratory: Negative.   Cardiovascular: Negative.     Blood pressure 116/62, pulse 70, resp. rate 12, height 5\' 1"  (1.549 m), weight 121 lb (54.9 kg).  Physical Exam Physical Exam  Constitutional: She is oriented to person, place, and time. She appears well-developed  and well-nourished.  Eyes: Conjunctivae are normal. No scleral icterus.  Neck: Neck supple.  Cardiovascular: Normal rate, regular rhythm and normal heart sounds.   Pulmonary/Chest: Effort normal and breath sounds normal. Right breast exhibits no inverted nipple, no mass, no nipple discharge, no skin change and no tenderness. Left breast exhibits no inverted nipple, no mass, no nipple discharge, no skin change and no tenderness.  Abdominal: Soft. There is no tenderness.  Lymphadenopathy:    She has no cervical adenopathy.    She has no axillary adenopathy.  Neurological: She is alert and oriented to  person, place, and time.  Skin: Skin is warm and dry.    Data Reviewed Mammogram reviewed- no suspicious findings for malignancy  Assessment    History of right breast cancer, stage 1, ER positive. Currently on Arimidex and doing well     Plan       Patient will be asked to return to the office in one year with a bilateral diagnotic mammogram.  This information has been scribed by Gaspar Cola CMA.   Rupinder Livingston G 07/02/2016, 1:52 PM

## 2016-07-10 ENCOUNTER — Inpatient Hospital Stay: Payer: Medicare Other | Admitting: *Deleted

## 2016-07-10 ENCOUNTER — Inpatient Hospital Stay: Payer: Medicare Other | Attending: Internal Medicine | Admitting: Internal Medicine

## 2016-07-10 VITALS — BP 127/79 | HR 68 | Temp 97.2°F | Resp 18

## 2016-07-10 DIAGNOSIS — M8580 Other specified disorders of bone density and structure, unspecified site: Secondary | ICD-10-CM | POA: Diagnosis not present

## 2016-07-10 DIAGNOSIS — Z923 Personal history of irradiation: Secondary | ICD-10-CM | POA: Diagnosis not present

## 2016-07-10 DIAGNOSIS — Z79899 Other long term (current) drug therapy: Secondary | ICD-10-CM | POA: Insufficient documentation

## 2016-07-10 DIAGNOSIS — G629 Polyneuropathy, unspecified: Secondary | ICD-10-CM | POA: Diagnosis not present

## 2016-07-10 DIAGNOSIS — Z803 Family history of malignant neoplasm of breast: Secondary | ICD-10-CM | POA: Diagnosis not present

## 2016-07-10 DIAGNOSIS — C50211 Malignant neoplasm of upper-inner quadrant of right female breast: Secondary | ICD-10-CM

## 2016-07-10 DIAGNOSIS — D72819 Decreased white blood cell count, unspecified: Secondary | ICD-10-CM | POA: Diagnosis not present

## 2016-07-10 DIAGNOSIS — Z170 Estrogen receptor positive status [ER+]: Secondary | ICD-10-CM | POA: Diagnosis not present

## 2016-07-10 DIAGNOSIS — Z79811 Long term (current) use of aromatase inhibitors: Secondary | ICD-10-CM | POA: Insufficient documentation

## 2016-07-10 DIAGNOSIS — Z9221 Personal history of antineoplastic chemotherapy: Secondary | ICD-10-CM | POA: Diagnosis not present

## 2016-07-10 DIAGNOSIS — E785 Hyperlipidemia, unspecified: Secondary | ICD-10-CM | POA: Insufficient documentation

## 2016-07-10 DIAGNOSIS — I10 Essential (primary) hypertension: Secondary | ICD-10-CM | POA: Insufficient documentation

## 2016-07-10 LAB — CBC WITH DIFFERENTIAL/PLATELET
BASOS ABS: 0.1 10*3/uL (ref 0–0.1)
Basophils Relative: 2 %
Eosinophils Absolute: 0.1 10*3/uL (ref 0–0.7)
Eosinophils Relative: 4 %
HEMATOCRIT: 36.5 % (ref 35.0–47.0)
HEMOGLOBIN: 12.3 g/dL (ref 12.0–16.0)
LYMPHS PCT: 33 %
Lymphs Abs: 0.8 10*3/uL — ABNORMAL LOW (ref 1.0–3.6)
MCH: 29.4 pg (ref 26.0–34.0)
MCHC: 33.7 g/dL (ref 32.0–36.0)
MCV: 87.2 fL (ref 80.0–100.0)
MONO ABS: 0.2 10*3/uL (ref 0.2–0.9)
Monocytes Relative: 9 %
NEUTROS ABS: 1.3 10*3/uL — AB (ref 1.4–6.5)
NEUTROS PCT: 52 %
Platelets: 270 10*3/uL (ref 150–440)
RBC: 4.18 MIL/uL (ref 3.80–5.20)
RDW: 13.6 % (ref 11.5–14.5)
WBC: 2.4 10*3/uL — AB (ref 3.6–11.0)

## 2016-07-10 LAB — COMPREHENSIVE METABOLIC PANEL
ALBUMIN: 4.1 g/dL (ref 3.5–5.0)
ALT: 14 U/L (ref 14–54)
AST: 21 U/L (ref 15–41)
Alkaline Phosphatase: 42 U/L (ref 38–126)
Anion gap: 6 (ref 5–15)
BILIRUBIN TOTAL: 0.7 mg/dL (ref 0.3–1.2)
BUN: 12 mg/dL (ref 6–20)
CO2: 31 mmol/L (ref 22–32)
CREATININE: 0.75 mg/dL (ref 0.44–1.00)
Calcium: 9.1 mg/dL (ref 8.9–10.3)
Chloride: 100 mmol/L — ABNORMAL LOW (ref 101–111)
GFR calc Af Amer: >60 mL/min (ref >60)
GLUCOSE: 105 mg/dL — AB (ref 65–99)
POTASSIUM: 3.3 mmol/L — AB (ref 3.5–5.1)
Sodium: 137 mmol/L (ref 135–145)
TOTAL PROTEIN: 6.6 g/dL (ref 6.5–8.1)

## 2016-07-10 NOTE — Progress Notes (Signed)
[place] @ [facility] [phone]  [phone]   INITIAL CONSULT  Kinsleigh Ludolph OB: 09/29/1945  MR#: [id number]  ENI#:[id number]  Patient Care Team: Lucille Passy, MD as PCP - General (Family Medicine) Glennie Isle, PA-C (Physician Assistant) Jerene Bears, MD as Consulting Physician (Gastroenterology) Lucille Passy, MD as Consulting Physician (Family Medicine) Seeplaputhur Robinette Haines, MD (General Surgery) Cammie Sickle, MD as Consulting Physician (Internal Medicine) Leona Singleton, RN as Oncology Nurse Navigator  CHIEF COMPLAINT:  Chief Complaint  Patient presents with  . Carcinoma of upper-inner quadrant of right breast in female,   VISIT DIAGNOSIS:     ICD-9-CM ICD-10-CM   1. Carcinoma of upper-inner quadrant of right breast in female, estrogen receptor positive ([place]) 174.2 C50.211 vitamin E 1000 UNIT capsule   V86.0 Z17.0 POTASSIUM CHLORIDE PO     CBC with Differential     Basic metabolic panel     Oncology History    carcinoma breast (right) inner and upper quadrant T1 cN0 M0 tumor Estrogen receptor positive progesterone receptor positive HER-2 receptor negative by fish Mammo print shows high risk (diagnosis in January of 2016) patient has 22% average risk in 5 years and 29% average risk in 10 years for distant metastectomy disease without chemotherapy on anti-hormonal treatment 2.  Patient started Cytoxan and Adriamycin on now August 30, 2015 MUGA scan shows ejection fraction of baseline 55%.  3.Patient has finished total of 4 cycles of chemotherapy on November 01, 2015 4.  Started on the Taxol from November 22, 2015; FINISHED RT [sep 2017]  # OCT 2017- START ARIMIDEX   # NOV 2017- BMD- OSTEOPENIA- ca+vit D     Carcinoma of upper-inner quadrant of right breast in female, estrogen receptor positive ([place])     INTERVAL HISTORY: 70 year old lady History of stage I right-sided breast cancers/p  adjuvant chemotherapy; Followed by  adjuvant radiation is here for follow-up/ Review the bone density test.  Patient continues to complain of tingling and numbness in her hands- currently not on any medication She is currently following her chiroproctor; notes to have improvement.   Patient denies any fevers or chills. Denies any nausea vomiting or constipation.   REVIEW OF SYSTEMS:  A complete 10 point review of system is done which is negative for mentioned above in history of present illness.  PAST MEDICAL HISTORY: Past Medical History:  Diagnosis Date  . Cancer ([place])   . Hyperlipidemia   . Hypertension   . Visual blurriness    SOMETHING NEW THAT HAS DEVELOPED SINCE 07-13-15 SURGERY- SEEN AT [facility] AND EXAM WAS NORMAL PER PT (08-09-15)    PAST SURGICAL HISTORY: Past Surgical History:  Procedure Laterality Date  . ABDOMINAL HYSTERECTOMY  1987  . BREAST BIOPSY Right    neg  . BREAST BIOPSY Left    4 oclock, FIBROEPITHELIAL LESION FAVOR CELLULAR FIBROADENOMA  . BREAST BIOPSY Left    2 oclock, CYSTIC APOCRINE METAPLASIA  . BREAST BIOPSY Right 07/13/15   130 INVASIVE MAMMARY CARCINOMA, NO SPECIAL TYPE  . BREAST LUMPECTOMY Left 07/13/2015   Procedure: BREAST LUMPECTOMY;  Surgeon: Christene Lye, MD;  Location: [facility] ORS;  Service: General;  Laterality: Left;  . BREAST LUMPECTOMY WITH SENTINEL LYMPH NODE BIOPSY Right 07/13/2015   Procedure: BREAST LUMPECTOMY WITH SENTINEL LYMPH NODE BX;  Surgeon: Christene Lye, MD;  Location: [facility] ORS;  Service: General;  Laterality: Right;  . BUNIONECTOMY Right 1993  . fatty tumor Left 2013  side  . PORTACATH PLACEMENT Left 08/16/2015   Procedure: INSERTION PORT-A-CATH;  Surgeon: Christene Lye, MD;  Location: [facility] ORS;  Service: General;  Laterality: Left;    FAMILY HISTORY Family History  Problem Relation Age of Onset  . Cancer Sister 71    breast cancer  . Breast cancer Sister 85  . Colon cancer Neg Hx         ADVANCED DIRECTIVES:   Patient  does not have any living will or healthcare power of attorney.  Information was given .  Available resources had been discussed.  We will follow-up on subsequent appointments regarding this issue HEALTH MAINTENANCE: Social History  Substance Use Topics  . Smoking status: Never Smoker  . Smokeless tobacco: Never Used  . Alcohol use No      :  Allergies  Allergen Reactions  . Lovastatin Anaphylaxis    Tongue swelling  . Cymbalta [Duloxetine Hcl]     glaucoma    Current Outpatient Prescriptions  Medication Sig Dispense Refill  . amLODipine (NORVASC) 5 MG tablet Take 1 tablet (5 mg total) by mouth daily. 90 tablet 2  . anastrozole (ARIMIDEX) 1 MG tablet Take 1 tablet (1 mg total) by mouth daily. 30 tablet 3  . cholecalciferol (VITAMIN D) 1000 units tablet Take 1,000 Units by mouth daily.    . fluticasone (FLONASE) 50 MCG/ACT nasal spray Place 2 sprays into both nostrils daily. 16 g 6  . hydrochlorothiazide (HYDRODIURIL) 25 MG tablet Take 1 tablet (25 mg total) by mouth daily. 90 tablet 2  . POTASSIUM CHLORIDE PO Take 1 tablet by mouth 2 (two) times daily.    . vitamin E 1000 UNIT capsule Take 1,000 Units by mouth daily.     No current facility-administered medications for this visit.     OBJECTIVE: PHYSICAL EXAM: GENERAL:  Well developed, well nourished, sitting comfortably in the exam room in no acute distress. MENTAL STATUS:  Alert and oriented to person, place and time.  ENT:  Oropharynx clear without lesion.  Tongue normal. Mucous membranes moist. Alopecia.  Examination of the nose shows redness.  No active bleeding. RESPIRATORY:  Clear to auscultation without rales, wheezes or rhonchi. Patient has a port placed on the left upper chest wall area.  No evidence of redness. CARDIOVASCULAR:  Regular rate and rhythm without murmur, rub or gallop.  ABDOMEN:  Soft, non-tender, with active bowel sounds, and no hepatosplenomegaly.  No masses. BACK:  No CVA tenderness.  No  tenderness on percussion of the back or rib cage. SKIN:  No rashes, ulcers or lesions. EXTREMITIES: No edema, no skin discoloration or tenderness.  No palpable cords. LYMPH NODES: No palpable cervical, supraclavicular, axillary or inguinal adenopathy  NEUROLOGICAL: Unremarkable. PSYCH:  Appropriate.  Vitals:   07/10/16 1139  BP: 127/79  Pulse: 68  Resp: 18  Temp: 97.2 F (36.2 C)     There is no height or weight on file to calculate BMI.    ECOG FS:0 - Asymptomatic  LAB RESULTS:  Clinical Support on 07/10/2016  Component Date Value Ref Range Status  . WBC 07/10/2016 2.4* 3.6 - 11.0 K/uL Final  . RBC 07/10/2016 4.18  3.80 - 5.20 MIL/uL Final  . Hemoglobin 07/10/2016 12.3  12.0 - 16.0 g/dL Final  . HCT 07/10/2016 36.5  35.0 - 47.0 % Final  . MCV 07/10/2016 87.2  80.0 - 100.0 fL Final  . MCH 07/10/2016 29.4  26.0 - 34.0 pg Final  . MCHC 07/10/2016 33.7  32.0 -  36.0 g/dL Final  . RDW 07/10/2016 13.6  11.5 - 14.5 % Final  . Platelets 07/10/2016 270  150 - 440 K/uL Final  . Neutrophils Relative % 07/10/2016 52  % Final  . Neutro Abs 07/10/2016 1.3* 1.4 - 6.5 K/uL Final  . Lymphocytes Relative 07/10/2016 33  % Final  . Lymphs Abs 07/10/2016 0.8* 1.0 - 3.6 K/uL Final  . Monocytes Relative 07/10/2016 9  % Final  . Monocytes Absolute 07/10/2016 0.2  0.2 - 0.9 K/uL Final  . Eosinophils Relative 07/10/2016 4  % Final  . Eosinophils Absolute 07/10/2016 0.1  0 - 0.7 K/uL Final  . Basophils Relative 07/10/2016 2  % Final  . Basophils Absolute 07/10/2016 0.1  0 - 0.1 K/uL Final  . Sodium 07/10/2016 137  135 - 145 mmol/L Final  . Potassium 07/10/2016 3.3* 3.5 - 5.1 mmol/L Final  . Chloride 07/10/2016 100* 101 - 111 mmol/L Final  . CO2 07/10/2016 31  22 - 32 mmol/L Final  . Glucose, Bld 07/10/2016 105* 65 - 99 mg/dL Final  . BUN 07/10/2016 12  6 - 20 mg/dL Final  . Creatinine, Ser 07/10/2016 0.75  0.44 - 1.00 mg/dL Final  . Calcium 07/10/2016 9.1  8.9 - 10.3 mg/dL Final  . Total Protein  07/10/2016 6.6  6.5 - 8.1 g/dL Final  . Albumin 07/10/2016 4.1  3.5 - 5.0 g/dL Final  . AST 07/10/2016 21  15 - 41 U/L Final  . ALT 07/10/2016 14  14 - 54 U/L Final  . Alkaline Phosphatase 07/10/2016 42  38 - 126 U/L Final  . Total Bilirubin 07/10/2016 0.7  0.3 - 1.2 mg/dL Final  . GFR calc non Af Amer 07/10/2016 >60  >60 mL/min Final  . GFR calc Af Amer 07/10/2016 >60  >60 mL/min Final   Comment: (NOTE) The eGFR has been calculated using the CKD EPI equation. This calculation has not been validated in all clinical situations. eGFR's persistently <60 mL/min signify possible Chronic Kidney Disease.   . Anion gap 07/10/2016 6  5 - 15 Final     STUDIES: Dg Bone Density  Result Date: 07/01/2016 EXAM: DUAL X-RAY ABSORPTIOMETRY (DXA) FOR BONE MINERAL DENSITY IMPRESSION: Dear Dr. Charlaine Dalton, Your patient Michaila Kenney completed a BMD test on 07/01/2016 using the [place] (analysis version: 14.10) manufactured by [employer]. The following summarizes the results of our evaluation. PATIENT BIOGRAPHICAL: Name: Jozee, Hammer Patient ID: [id number] Birth Date: [birth year] Height: 62.0 in. Gender: Female Exam Date: 07/01/2016 Weight: 121.5 lbs. Indications: Advanced Age, Breast CA, Hysterectomy, Postmenopausal Fractures: Treatments: Arimidex ASSESSMENT: The BMD measured at Femur Total Right is 0.841 g/cm2 with a T-score of -1.3. This patient is considered OSTEOPENIC according to [facility]) criteria. Patient is not a candidate for FRAX assessment due to Arimidex therapy. Site Region Measured Measured WHO Young Adult BMD Date       Age      Classification T-score AP Spine L1-L4 6/01/2016 71.4 Normal -0.8 1.098 g/cm2 DualFemur Total Right 07/01/2016 71.4 Osteopenia -1.3 0.841 g/cm2 World Health Organization [facility]) criteria for post-menopausal, Caucasian Women: Normal:       T-score at or above -1 SD Osteopenia:   T-score between -1 and -2.5 SD Osteoporosis:  T-score at or below -2.5 SD RECOMMENDATIONS: [place] recommends that FDA-approved medical therapies be considered in postmenopausal women and men age 21 or older with a: 1. Hip or vertebral (clinical or morphometric) fracture. 2. T-score of < -  2.5 at the spine or hip. 3. Ten-year fracture probability by FRAX of 3% or greater for hip fracture or 20% or greater for major osteoporotic fracture. All treatment decisions require clinical judgment and consideration of individual patient factors, including patient preferences, co-morbidities, previous drug use, risk factors not captured in the FRAX model (e.g. falls, vitamin D deficiency, increased bone turnover, interval significant decline in bone density) and possible under - or over-estimation of fracture risk by FRAX. All patients should ensure an adequate intake of dietary calcium (1200 mg/d) and vitamin D (800 IU daily) unless contraindicated. FOLLOW-UP: People with diagnosed cases of osteoporosis or at high risk for fracture should have regular bone mineral density tests. For patients eligible for Medicare, routine testing is allowed once every 2 years. The testing frequency can be increased to one year for patients who have rapidly progressing disease, those who are receiving or discontinuing medical therapy to restore bone mass, or have additional risk factors. I have reviewed this report, and agree with the above findings. Mark A. Boles,M.D. [facility] Electronically Signed   By: Lavonia Dana M.D.   On: 07/01/2016 11:32   Mm Diag Breast Tomo Bilateral  Result Date: 06/24/2016 CLINICAL DATA:  70 year old female with history of right breast cancer post lumpectomy 07/13/2015. EXAM: 2D DIGITAL DIAGNOSTIC BILATERAL MAMMOGRAM WITH CAD AND ADJUNCT TOMO COMPARISON:  Previous exam(s). ACR Breast Density Category b: There are scattered areas of fibroglandular density. FINDINGS: No suspicious masses or calcifications are seen in either  breast. Dystrophic appearing calcifications are seen in the upper for outer far posterior left breast at site of prior benign breast biopsy. Postsurgical changes are present in the upper slightly inner right breast related to prior lumpectomy. Spot compression magnification MLO views of the lumpectomy site in the right breast was performed. There is no mammographic evidence of locally recurrent malignancy. Evolving dystrophic calcifications are noted at the lumpectomy site, benign in appearance. Mammographic images were processed with CAD. IMPRESSION: No mammographic evidence of malignancy in either breast. RECOMMENDATION: Diagnostic mammogram is suggested in 1 year. (Code:[id number]) I have discussed the findings and recommendations with the patient. Results were also provided in writing at the conclusion of the visit. If applicable, a reminder letter will be sent to the patient regarding the next appointment. BI-RADS CATEGORY  2: Benign. Electronically Signed   By: Everlean Alstrom M.D.   On: 06/24/2016 11:15    ASSESSMENT:   Carcinoma of upper-inner quadrant of right breast in female, estrogen receptor positive ([place]) Stage I ER/PR positive HER-2/neu negative. Currently finished adjuvant chemotherapy. No clinical evidence of recurrence.  S/p RT. On Anastrazole.   # Osteopenia- BMD Nov 2017- discussed Prolia every 6 months; pt wants think about this. Discussed AEs   # mild leucopenia- monitor for now.   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 Will extend handicap for 6 months.   # follow up in 4 months/labs.   No matching staging information was found for the patient.  Cammie Sickle, MD   07/10/2016 12:58 PM

## 2016-07-10 NOTE — Assessment & Plan Note (Addendum)
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 Currently finished adjuvant chemotherapy. No clinical evidence of recurrence.  S/p RT. On Anastrazole.   # Osteopenia- BMD Nov 2017- discussed Prolia every 6 months; pt wants think about this. Discussed AEs   # mild leucopenia- monitor for now.   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 Will extend handicap for 6 months.   # follow up in 4 months/labs.

## 2016-07-10 NOTE — Progress Notes (Signed)
Patient is here for follow up, she is doing well no complaints  would like to see if you can extend her handicap placard

## 2016-08-05 DIAGNOSIS — G629 Polyneuropathy, unspecified: Secondary | ICD-10-CM

## 2016-08-05 HISTORY — DX: Polyneuropathy, unspecified: G62.9

## 2016-08-07 ENCOUNTER — Telehealth: Payer: Self-pay | Admitting: Family Medicine

## 2016-08-07 DIAGNOSIS — E785 Hyperlipidemia, unspecified: Secondary | ICD-10-CM

## 2016-08-07 NOTE — Telephone Encounter (Signed)
Orders entered

## 2016-08-07 NOTE — Telephone Encounter (Signed)
Pt called to ck on labs for cholestr only since going off meds.

## 2016-08-07 NOTE — Telephone Encounter (Signed)
Pt called to get set up for cholesterol only labs since going off of meds. I set up appt for 1/9, needs orders.

## 2016-08-13 ENCOUNTER — Other Ambulatory Visit (INDEPENDENT_AMBULATORY_CARE_PROVIDER_SITE_OTHER): Payer: Medicare Other

## 2016-08-13 DIAGNOSIS — E785 Hyperlipidemia, unspecified: Secondary | ICD-10-CM | POA: Diagnosis not present

## 2016-08-13 LAB — LIPID PANEL
CHOL/HDL RATIO: 3
CHOLESTEROL: 208 mg/dL — AB (ref 0–200)
HDL: 70.4 mg/dL (ref >39.00)
LDL CALC: 128 mg/dL — AB (ref 0–99)
NONHDL: 137.83
Triglycerides: 49 mg/dL (ref 0.0–149.0)
VLDL: 9.8 mg/dL (ref 0.0–40.0)

## 2016-08-26 ENCOUNTER — Telehealth: Payer: Self-pay

## 2016-08-26 NOTE — Telephone Encounter (Signed)
Pt left v/m; pt has been seeing opthalmologist; pressure in eye is elevated and eye doctor thinks HCTZ may be causing elevated pressure in pts eye; pt wants to know if HCTZ could cause high pressure in lt eye. Pt recently had lazor surgery done on eye. Pt wants to know if could get different med for HCTZ. [facility] [address].Pt request cb.

## 2016-08-27 MED ORDER — LISINOPRIL 10 MG PO TABS: 10.0000 mg | ORAL_TABLET | Freq: Every day | ORAL | 3 refills | Status: DC

## 2016-08-27 NOTE — Telephone Encounter (Signed)
Has she been on lisinopril in the past?  I do not see this on her allergy list.

## 2016-08-27 NOTE — Telephone Encounter (Signed)
Pt left v/m requesting call back about med change due to pressure in her eye.

## 2016-08-27 NOTE — Telephone Encounter (Signed)
Spoke to pt and advised. F/u appt scheduled.  

## 2016-08-27 NOTE — Telephone Encounter (Signed)
Spoke to pt who states, to her knowledge, she has never taken lisinopril

## 2016-08-27 NOTE — Telephone Encounter (Signed)
Please stop taking HCTZ.  eRx sent for lisinopril 10 mg daily.  Please come see me in 2 weeks for BP check.

## 2016-09-05 ENCOUNTER — Other Ambulatory Visit: Payer: Self-pay | Admitting: Internal Medicine

## 2016-09-05 DIAGNOSIS — Z95828 Presence of other vascular implants and grafts: Secondary | ICD-10-CM

## 2016-09-05 DIAGNOSIS — C50211 Malignant neoplasm of upper-inner quadrant of right female breast: Secondary | ICD-10-CM

## 2016-09-05 DIAGNOSIS — Z170 Estrogen receptor positive status [ER+]: Principal | ICD-10-CM

## 2016-09-10 ENCOUNTER — Encounter: Payer: Self-pay | Admitting: Family Medicine

## 2016-09-10 ENCOUNTER — Ambulatory Visit (INDEPENDENT_AMBULATORY_CARE_PROVIDER_SITE_OTHER): Payer: Medicare Other | Admitting: Family Medicine

## 2016-09-10 VITALS — BP 136/92 | HR 80 | Temp 98.2°F | Wt 121.0 lb

## 2016-09-10 DIAGNOSIS — I10 Essential (primary) hypertension: Secondary | ICD-10-CM

## 2016-09-10 NOTE — Patient Instructions (Signed)
Great to see you. Please schedule a lab visit from months from now.

## 2016-09-10 NOTE — Progress Notes (Signed)
Subjective:   Patient ID: Sandra [name], female    DOB: 1946-05-09, 71 y.o.   MRN: [id number]  Sandra [name] is a pleasant 71 y.o. year old female who presents to clinic today with Follow-up (BP med change)  on 09/10/2016  HPI:  Pt called [place] on 08/26/16 stating that her eye doctor was concerning that HCTZ could be causing elevated pressure in her eyes.  We therefore d/c'd her HCTZ and started her on Lisinopril 10 mg daily.  She denies any noticeable side effects- no cough.  Current Outpatient Prescriptions on File Prior to Visit  Medication Sig Dispense Refill  . amLODipine (NORVASC) 5 MG tablet Take 1 tablet (5 mg total) by mouth daily. 90 tablet 2  . anastrozole (ARIMIDEX) 1 MG tablet TAKE ONE TABLET BY MOUTH ONCE DAILY 30 tablet 3  . cholecalciferol (VITAMIN D) 1000 units tablet Take 1,000 Units by mouth daily.    . fluticasone (FLONASE) 50 MCG/ACT nasal spray Place 2 sprays into both nostrils daily. 16 g 6  . lisinopril (PRINIVIL,ZESTRIL) 10 MG tablet Take 1 tablet (10 mg total) by mouth daily. 90 tablet 3  . POTASSIUM CHLORIDE PO Take 1 tablet by mouth 2 (two) times daily.    . vitamin E 1000 UNIT capsule Take 1,000 Units by mouth daily.     No current facility-administered medications on file prior to visit.     Allergies  Allergen Reactions  . Lovastatin Anaphylaxis    Tongue swelling  . Cymbalta [Duloxetine Hcl]     glaucoma    Past Medical History:  Diagnosis Date  . Cancer ([place])   . Hyperlipidemia   . Hypertension   . Visual blurriness    SOMETHING NEW THAT HAS DEVELOPED SINCE 07-13-15 SURGERY- SEEN AT [facility] ENT AND EXAM WAS NORMAL PER PT (08-09-15)    Past Surgical History:  Procedure Laterality Date  . ABDOMINAL HYSTERECTOMY  1987  . BREAST BIOPSY Right    neg  . BREAST BIOPSY Left    4 oclock, FIBROEPITHELIAL LESION FAVOR CELLULAR FIBROADENOMA  . BREAST BIOPSY Left    2 oclock, CYSTIC APOCRINE METAPLASIA  . BREAST BIOPSY Right  07/13/15   130 INVASIVE MAMMARY CARCINOMA, NO SPECIAL TYPE  . BREAST LUMPECTOMY Left 07/13/2015   Procedure: BREAST LUMPECTOMY;  Surgeon: Christene Lye, MD;  Location: [facility] ORS;  Service: General;  Laterality: Left;  . BREAST LUMPECTOMY WITH SENTINEL LYMPH NODE BIOPSY Right 07/13/2015   Procedure: BREAST LUMPECTOMY WITH SENTINEL LYMPH NODE BX;  Surgeon: Christene Lye, MD;  Location: [facility] ORS;  Service: General;  Laterality: Right;  . BUNIONECTOMY Right 1993  . fatty tumor Left 2013   side  . PORTACATH PLACEMENT Left 08/16/2015   Procedure: INSERTION PORT-A-CATH;  Surgeon: Christene Lye, MD;  Location: [facility] ORS;  Service: General;  Laterality: Left;    Family History  Problem Relation Age of Onset  . Cancer Sister 82    breast cancer  . Breast cancer Sister 45  . Colon cancer Neg Hx     Social History   Social History  . Marital status: Married    Spouse name: N/A  . Number of children: N/A  . Years of education: N/A   Occupational History  . Not on file.   Social History Main Topics  . Smoking status: Never Smoker  . Smokeless tobacco: Never Used  . Alcohol use No  . Drug use: No  . Sexual activity: Not on file  Other Topics Concern  . Not on file   Social History Narrative   End of life wishes (updated 01/2013)-   Daughter of wishes (Cherlyl Duwayne Heck)      Desires CPR.   Desires life support if reasonable.            The PMH, PSH, Social History, Family History, Medications, and allergies have been reviewed in [facility], and have been updated if relevant.    Review of Systems  Constitutional: Negative.   Eyes: Negative.   Respiratory: Negative.   Cardiovascular: Negative.   Musculoskeletal: Negative.   Neurological: Negative.   Psychiatric/Behavioral: Negative.   All other systems reviewed and are negative.      Objective:    BP (!) 136/92   Pulse 80   Temp 98.2 F (36.8 C) (Oral)   Wt 121 lb (54.9 kg)   LMP  (LMP Unknown)    SpO2 98%   BMI 22.86 kg/m    Physical Exam  Constitutional: She is oriented to person, place, and time. She appears well-developed and well-nourished. No distress.  HENT:  Head: Normocephalic and atraumatic.  Eyes: Conjunctivae are normal.  Cardiovascular: Normal rate and regular rhythm.   Pulmonary/Chest: Effort normal and breath sounds normal. No respiratory distress.  Musculoskeletal: Normal range of motion. She exhibits no edema.  Neurological: She is alert and oriented to person, place, and time. No cranial nerve deficit.  Skin: Skin is warm and dry. She is not diaphoretic.  Psychiatric: She has a normal mood and affect. Her behavior is normal. Judgment and thought content normal.  Nursing note and vitals reviewed.         Assessment & Plan:   Essential hypertension No Follow-up on file.

## 2016-09-10 NOTE — Progress Notes (Signed)
Pre visit review using our clinic review tool, if applicable. No additional management support is needed unless otherwise documented below in the visit note. 

## 2016-09-16 ENCOUNTER — Encounter: Payer: Self-pay | Admitting: *Deleted

## 2016-09-17 ENCOUNTER — Encounter: Payer: Self-pay | Admitting: *Deleted

## 2016-09-17 ENCOUNTER — Encounter
Admission: RE | Disposition: A | Discharge status: Home / Self Care | Payer: Self-pay | Source: Ambulatory Visit | Attending: Ophthalmology

## 2016-09-17 ENCOUNTER — Ambulatory Visit: Payer: Medicare Other | Admitting: Certified Registered"

## 2016-09-17 ENCOUNTER — Ambulatory Visit
Admission: RE | Admit: 2016-09-17 | Discharge: 2016-09-17 | Disposition: A | Discharge status: Home / Self Care | Payer: Medicare Other | Source: Ambulatory Visit | Attending: Ophthalmology | Admitting: Ophthalmology

## 2016-09-17 DIAGNOSIS — Z90710 Acquired absence of both cervix and uterus: Secondary | ICD-10-CM | POA: Insufficient documentation

## 2016-09-17 DIAGNOSIS — E7800 Pure hypercholesterolemia, unspecified: Secondary | ICD-10-CM | POA: Insufficient documentation

## 2016-09-17 DIAGNOSIS — Z888 Allergy status to other drugs, medicaments and biological substances status: Secondary | ICD-10-CM | POA: Diagnosis not present

## 2016-09-17 DIAGNOSIS — H2512 Age-related nuclear cataract, left eye: Secondary | ICD-10-CM | POA: Insufficient documentation

## 2016-09-17 DIAGNOSIS — Z853 Personal history of malignant neoplasm of breast: Secondary | ICD-10-CM | POA: Diagnosis not present

## 2016-09-17 DIAGNOSIS — I10 Essential (primary) hypertension: Secondary | ICD-10-CM | POA: Insufficient documentation

## 2016-09-17 HISTORY — PX: CATARACT EXTRACTION W/PHACO: SHX586

## 2016-09-17 SURGERY — PHACOEMULSIFICATION, CATARACT, WITH IOL INSERTION
Anesthesia: Monitor Anesthesia Care | Site: Eye | Laterality: Left | Wound class: Clean

## 2016-09-17 MED ORDER — MIDAZOLAM HCL 2 MG/2ML IJ SOLN
INTRAMUSCULAR | Status: AC
  Filled 2016-09-17: qty 2

## 2016-09-17 MED ORDER — POVIDONE-IODINE 5 % OP SOLN
OPHTHALMIC | Status: AC
  Filled 2016-09-17: qty 30

## 2016-09-17 MED ORDER — FENTANYL CITRATE (PF) 100 MCG/2ML IJ SOLN
INTRAMUSCULAR | Status: DC | PRN
Start: 2016-09-17 — End: 2016-09-17
  Administered 2016-09-17: 50 ug via INTRAVENOUS

## 2016-09-17 MED ORDER — CARBACHOL 0.01 % IO SOLN
INTRAOCULAR | Status: DC | PRN
  Administered 2016-09-17: .5 mL via INTRAOCULAR

## 2016-09-17 MED ORDER — ARMC OPHTHALMIC DILATING DROPS: 1.0000 "application " | OPHTHALMIC | Status: AC

## 2016-09-17 MED ORDER — EPINEPHRINE PF 1 MG/ML IJ SOLN
INTRAOCULAR | Status: DC | PRN
  Administered 2016-09-17: 1 mL via OPHTHALMIC

## 2016-09-17 MED ORDER — MOXIFLOXACIN HCL 0.5 % OP SOLN
OPHTHALMIC | Status: DC | PRN
  Administered 2016-09-17: .2 mL via OPHTHALMIC

## 2016-09-17 MED ORDER — EPINEPHRINE PF 1 MG/ML IJ SOLN
INTRAMUSCULAR | Status: AC
  Filled 2016-09-17: qty 2

## 2016-09-17 MED ORDER — ARMC OPHTHALMIC DILATING DROPS
1.0000 "application " | OPHTHALMIC | Status: AC
  Administered 2016-09-17 (×2): 1 via OPHTHALMIC

## 2016-09-17 MED ORDER — FENTANYL CITRATE (PF) 100 MCG/2ML IJ SOLN
INTRAMUSCULAR | Status: AC
  Filled 2016-09-17: qty 2

## 2016-09-17 MED ORDER — NA CHONDROIT SULF-NA HYALURON 40-17 MG/ML IO SOLN
INTRAOCULAR | Status: AC
  Filled 2016-09-17: qty 1

## 2016-09-17 MED ORDER — NA CHONDROIT SULF-NA HYALURON 40-17 MG/ML IO SOLN
INTRAOCULAR | Status: DC | PRN
  Administered 2016-09-17: 1 mL via INTRAOCULAR

## 2016-09-17 MED ORDER — MOXIFLOXACIN HCL 0.5 % OP SOLN
OPHTHALMIC | Status: AC
  Filled 2016-09-17: qty 3

## 2016-09-17 MED ORDER — MIDAZOLAM HCL 2 MG/2ML IJ SOLN
INTRAMUSCULAR | Status: DC | PRN
  Administered 2016-09-17: 1 mg via INTRAVENOUS

## 2016-09-17 MED ORDER — MOXIFLOXACIN HCL 0.5 % OP SOLN: 1.0000 [drp] | OPHTHALMIC | Status: DC | PRN

## 2016-09-17 MED ORDER — SODIUM CHLORIDE 0.9 % IV SOLN
INTRAVENOUS | Status: DC
  Administered 2016-09-17: 08:00:00 via INTRAVENOUS

## 2016-09-17 MED ORDER — ARMC OPHTHALMIC DILATING DROPS
OPHTHALMIC | Status: AC
  Filled 2016-09-17: qty 0.4

## 2016-09-17 MED ORDER — LIDOCAINE HCL (PF) 4 % IJ SOLN
INTRAMUSCULAR | Status: DC | PRN
  Administered 2016-09-17: 2.25 mL via OPHTHALMIC

## 2016-09-17 SURGICAL SUPPLY — 22 items
CANNULA ANT/CHMB 27G (MISCELLANEOUS) ×1 IMPLANT
CANNULA ANT/CHMB 27GA (MISCELLANEOUS) ×3 IMPLANT
CUP MEDICINE 2OZ PLAST GRAD ST (MISCELLANEOUS) ×3 IMPLANT
GLOVE BIO SURGEON STRL SZ8 (GLOVE) ×3 IMPLANT
GLOVE BIOGEL M 6.5 STRL (GLOVE) ×3 IMPLANT
GLOVE SURG LX 8.0 MICRO (GLOVE) ×2
GLOVE SURG LX STRL 8.0 MICRO (GLOVE) ×1 IMPLANT
GOWN STRL REUS W/ TWL LRG LVL3 (GOWN DISPOSABLE) ×2 IMPLANT
GOWN STRL REUS W/TWL LRG LVL3 (GOWN DISPOSABLE) ×6
LENS IOL TECNIS ITEC 25.0 (Intraocular Lens) ×2 IMPLANT
PACK CATARACT (MISCELLANEOUS) ×3 IMPLANT
PACK CATARACT BRASINGTON LX (MISCELLANEOUS) ×3 IMPLANT
PACK EYE AFTER SURG (MISCELLANEOUS) ×3 IMPLANT
SOL BSS BAG (MISCELLANEOUS) ×3
SOL PREP PVP 2OZ (MISCELLANEOUS) ×3
SOLUTION BSS BAG (MISCELLANEOUS) ×1 IMPLANT
SOLUTION PREP PVP 2OZ (MISCELLANEOUS) ×1 IMPLANT
SYR 3ML LL SCALE MARK (SYRINGE) ×3 IMPLANT
SYR 5ML LL (SYRINGE) ×3 IMPLANT
SYR TB 1ML 27GX1/2 LL (SYRINGE) ×3 IMPLANT
WATER STERILE IRR 250ML POUR (IV SOLUTION) ×3 IMPLANT
WIPE NON LINTING 3.25X3.25 (MISCELLANEOUS) ×3 IMPLANT

## 2016-09-17 NOTE — Discharge Instructions (Signed)
Eye Surgery Discharge Instructions  Expect mild scratchy sensation or mild soreness. DO NOT RUB YOUR EYE!  The day of surgery:  Minimal physical activity, but bed rest is not required  No reading, computer work, or close hand work  No bending, lifting, or straining.  May watch TV  For 24 hours:  No driving, legal decisions, or alcoholic beverages  Safety precautions  Eat anything you prefer: It is better to start with liquids, then soup then solid foods.  _____ Eye patch should be worn until postoperative exam tomorrow.  ____ Solar shield eyeglasses should be worn for comfort in the sunlight/patch while sleeping  Resume all regular medications including aspirin or Coumadin if these were discontinued prior to surgery. You may shower, bathe, shave, or wash your hair. Tylenol may be taken for mild discomfort.  Call your doctor if you experience significant pain, nausea, or vomiting, fever > 101 or other signs of infection. [phone] or [phone] Specific instructions:  Follow-up Information    PORFILIO,WILLIAM LOUIS, MD Follow up.   Specialty:  Ophthalmology Why:  February 14 at 10:10am Contact information: [address] [place] [address] [phone]

## 2016-09-17 NOTE — Anesthesia Post-op Follow-up Note (Cosign Needed)
Anesthesia QCDR form completed.        

## 2016-09-17 NOTE — Anesthesia Procedure Notes (Signed)
Procedure Name: MAC Performed by: Eloisa Chokshi Pre-anesthesia Checklist: Patient identified, Emergency Drugs available, Suction available, Patient being monitored and Timeout performed Patient Re-evaluated:Patient Re-evaluated prior to inductionOxygen Delivery Method: Nasal cannula Preoxygenation: Pre-oxygenation with 100% oxygen Intubation Type: IV induction       

## 2016-09-17 NOTE — Transfer of Care (Signed)
Immediate Anesthesia Transfer of Care Note  Patient: Sandra [name]  Procedure(s) Performed: Procedure(s) with comments: CATARACT EXTRACTION PHACO AND INTRAOCULAR LENS PLACEMENT (IOC) (Left) - [place] 00:47 AP% 17.6 CDE 8.39 Fluid pack lot # [id number]  Patient Location: PACU  Anesthesia Type:MAC  Level of Consciousness: awake, alert  and oriented  Airway & Oxygen Therapy: Patient Spontanous Breathing  Post-op Assessment: Report given to RN and Post -op Vital signs reviewed and stable  Post vital signs: Reviewed and stable  Last Vitals:  Vitals:   09/17/16 0953 09/17/16 0956  BP: (!) 146/67 (!) 146/67  Pulse: 67 67  Resp: 17 11  Temp: 36.9 C     Last Pain:  Vitals:   09/17/16 0814  TempSrc: Temporal         Complications: No apparent anesthesia complications

## 2016-09-17 NOTE — Anesthesia Postprocedure Evaluation (Signed)
Anesthesia Post Note  Patient: Sandra [name]  Procedure(s) Performed: Procedure(s) (LRB): CATARACT EXTRACTION PHACO AND INTRAOCULAR LENS PLACEMENT (IOC) (Left)  Patient location during evaluation: PACU Anesthesia Type: MAC Level of consciousness: awake, awake and alert and oriented Pain management: pain level controlled Vital Signs Assessment: post-procedure vital signs reviewed and stable Respiratory status: spontaneous breathing, nonlabored ventilation and respiratory function stable Cardiovascular status: stable Anesthetic complications: no     Last Vitals:  Vitals:   09/17/16 0953 09/17/16 0956  BP: (!) 146/67 (!) 146/67  Pulse: 67 67  Resp: 17 11  Temp: 36.9 C     Last Pain:  Vitals:   09/17/16 0814  TempSrc: Temporal                 Lance Muss

## 2016-09-17 NOTE — H&P (Signed)
All labs reviewed. Abnormal studies sent to patients PCP when indicated.  Previous H&P reviewed, patient examined, there are NO CHANGES.  Sandra [name] LOUIS2/13/20189:17 AM

## 2016-09-17 NOTE — Anesthesia Preprocedure Evaluation (Signed)
Anesthesia Evaluation  Patient identified by MRN, date of birth, ID band Patient awake    Reviewed: Allergy & Precautions, H&P , NPO status , Patient's Chart, lab work & pertinent test results, reviewed documented beta blocker date and time   Airway Mallampati: II  TM Distance: >3 FB Neck ROM: full    Dental no notable dental hx. (+) Teeth Intact   Pulmonary neg pulmonary ROS,    Pulmonary exam normal breath sounds clear to auscultation       Cardiovascular Exercise Tolerance: Good hypertension, negative cardio ROS Normal cardiovascular exam Rhythm:regular Rate:Normal     Neuro/Psych  Neuromuscular disease negative neurological ROS  negative psych ROS   GI/Hepatic negative GI ROS, Neg liver ROS,   Endo/Other  negative endocrine ROSdiabetes  Renal/GU negative Renal ROS  negative genitourinary   Musculoskeletal   Abdominal   Peds  Hematology negative hematology ROS (+)   Anesthesia Other Findings   Reproductive/Obstetrics negative OB ROS                             Anesthesia Physical Anesthesia Plan  ASA: III  Anesthesia Plan: MAC   Post-op Pain Management:    Induction:   Airway Management Planned:   Additional Equipment:   Intra-op Plan:   Post-operative Plan:   Informed Consent: I have reviewed the patients History and Physical, chart, labs and discussed the procedure including the risks, benefits and alternatives for the proposed anesthesia with the patient or authorized representative who has indicated his/her understanding and acceptance.     Plan Discussed with: CRNA  Anesthesia Plan Comments:         Anesthesia Quick Evaluation

## 2016-09-17 NOTE — Op Note (Signed)
PREOPERATIVE DIAGNOSIS:  Nuclear sclerotic cataract of the left eye.   POSTOPERATIVE DIAGNOSIS:  Nuclear sclerotic cataract of the left eye.   OPERATIVE PROCEDURE: Procedure(s): CATARACT EXTRACTION PHACO AND INTRAOCULAR LENS PLACEMENT (IOC)   SURGEON:  Birder Robson, MD.   ANESTHESIA:  Anesthesiologist: Molli Barrows, MD CRNA: Lance Muss, CRNA  1.      Managed anesthesia care. 2.     0.71ml of Shugarcaine was instilled following the paracentesis   COMPLICATIONS:  None.   TECHNIQUE:   Stop and chop   DESCRIPTION OF PROCEDURE:  The patient was examined and consented in the preoperative holding area where the aforementioned topical anesthesia was applied to the left eye and then brought back to the Operating Room where the left eye was prepped and draped in the usual sterile ophthalmic fashion and a lid speculum was placed. A paracentesis was created with the side port blade and the anterior chamber was filled with viscoelastic. A near clear corneal incision was performed with the steel keratome. A continuous curvilinear capsulorrhexis was performed with a cystotome followed by the capsulorrhexis forceps. Hydrodissection and hydrodelineation were carried out with BSS on a blunt cannula. The lens was removed in a stop and chop  technique and the remaining cortical material was removed with the irrigation-aspiration handpiece. The capsular bag was inflated with viscoelastic and the Technis [id number] lens was placed in the capsular bag without complication. The remaining viscoelastic was removed from the eye with the irrigation-aspiration handpiece. The wounds were hydrated. The anterior chamber was flushed with Miostat and the eye was inflated to physiologic pressure. 0.65ml Vigamox was placed in the anterior chamber. The wounds were found to be water tight. The eye was dressed with Vigamox. The patient was given protective glasses to wear throughout the day and a shield with which to sleep tonight. The  patient was also given drops with which to begin a drop regimen today and will follow-up with me in one day.  Implant Name Type Inv. Item Serial No. Manufacturer Lot No. LRB No. Used  LENS IOL DIOP 25.0 - [id number] Intraocular Lens LENS IOL DIOP 25.0 [id number]   Left 1    Procedure(s) with comments: CATARACT EXTRACTION PHACO AND INTRAOCULAR LENS PLACEMENT (IOC) (Left) - [place] 00:47 AP% 17.6 CDE 8.39 Fluid pack lot # [id number]  Electronically signed: [place] 09/17/2016 9:52 AM

## 2016-09-27 ENCOUNTER — Ambulatory Visit (INDEPENDENT_AMBULATORY_CARE_PROVIDER_SITE_OTHER): Payer: Medicare Other | Admitting: Primary Care

## 2016-09-27 ENCOUNTER — Encounter: Payer: Self-pay | Admitting: Primary Care

## 2016-09-27 VITALS — BP 142/92 | HR 85 | Temp 98.3°F | Ht 61.0 in | Wt 121.4 lb

## 2016-09-27 DIAGNOSIS — J302 Other seasonal allergic rhinitis: Secondary | ICD-10-CM | POA: Diagnosis not present

## 2016-09-27 NOTE — Progress Notes (Signed)
Pre visit review using our clinic review tool, if applicable. No additional management support is needed unless otherwise documented below in the visit note. 

## 2016-09-27 NOTE — Progress Notes (Signed)
Subjective:    Patient ID: Sandra [name], female    DOB: Apr 06, 1946, 71 y.o.   MRN: [id number]  HPI  Sandra [name] is a 71 year old female who presents today with a chief complaint of cough. She also reports nasal congestion, chest congestion, sneezing, runny nose. Her symptoms began 5 days ago. She started feeling better from the cold symptoms several days ago, but the sneezing and drainage began recently. Her cough has improved. She's taken Mucinex DM and cough drops, and applies Vicks vapor rub at night. She denies fevers and sick contacts.   Review of Systems  Constitutional: Positive for fatigue. Negative for chills and fever.  HENT: Positive for congestion, postnasal drip and sneezing. Negative for sinus pressure and sore throat.   Respiratory: Positive for cough. Negative for shortness of breath.   Cardiovascular: Negative for chest pain.       Past Medical History:  Diagnosis Date  . Cancer ([place])   . Hyperlipidemia   . Hypertension   . Visual blurriness    SOMETHING NEW THAT HAS DEVELOPED SINCE 07-13-15 SURGERY- SEEN AT [facility] ENT AND EXAM WAS NORMAL PER PT (08-09-15)     Social History   Social History  . Marital status: Married    Spouse name: N/A  . Number of children: N/A  . Years of education: N/A   Occupational History  . Not on file.   Social History Main Topics  . Smoking status: Never Smoker  . Smokeless tobacco: Never Used  . Alcohol use No  . Drug use: No  . Sexual activity: Not on file   Other Topics Concern  . Not on file   Social History Narrative   End of life wishes (updated 01/2013)-   Daughter of wishes (Sandra [name])      Desires CPR.   Desires life support if reasonable.             Past Surgical History:  Procedure Laterality Date  . ABDOMINAL HYSTERECTOMY  1987  . BREAST BIOPSY Right    neg  . BREAST BIOPSY Left    4 oclock, FIBROEPITHELIAL LESION FAVOR CELLULAR FIBROADENOMA  . BREAST BIOPSY Left    2  oclock, CYSTIC APOCRINE METAPLASIA  . BREAST BIOPSY Right 07/13/15   130 INVASIVE MAMMARY CARCINOMA, NO SPECIAL TYPE  . BREAST LUMPECTOMY Left 07/13/2015   Procedure: BREAST LUMPECTOMY;  Surgeon: Christene Lye, MD;  Location: [facility] ORS;  Service: General;  Laterality: Left;  . BREAST LUMPECTOMY WITH SENTINEL LYMPH NODE BIOPSY Right 07/13/2015   Procedure: BREAST LUMPECTOMY WITH SENTINEL LYMPH NODE BX;  Surgeon: Christene Lye, MD;  Location: [facility] ORS;  Service: General;  Laterality: Right;  . BUNIONECTOMY Right 1993  . CATARACT EXTRACTION W/PHACO Left 09/17/2016   Procedure: CATARACT EXTRACTION PHACO AND INTRAOCULAR LENS PLACEMENT (IOC);  Surgeon: Birder Robson, MD;  Location: [facility] ORS;  Service: Ophthalmology;  Laterality: Left;  [place] 00:47 AP% 17.6 CDE 8.39 Fluid pack lot # [id number]  . fatty tumor Left 2013   side  . PORTACATH PLACEMENT Left 08/16/2015   Procedure: INSERTION PORT-A-CATH;  Surgeon: Christene Lye, MD;  Location: [facility] ORS;  Service: General;  Laterality: Left;    Family History  Problem Relation Age of Onset  . Cancer Sister 15    breast cancer  . Breast cancer Sister 35  . Colon cancer Neg Hx     Allergies  Allergen Reactions  . Lovastatin Anaphylaxis    Tongue swelling  .  Cymbalta [Duloxetine Hcl]     glaucoma    Current Outpatient Prescriptions on File Prior to Visit  Medication Sig Dispense Refill  . amLODipine (NORVASC) 5 MG tablet Take 1 tablet (5 mg total) by mouth daily. 90 tablet 2  . anastrozole (ARIMIDEX) 1 MG tablet TAKE ONE TABLET BY MOUTH ONCE DAILY 30 tablet 3  . brimonidine-timolol (COMBIGAN) 0.2-0.5 % ophthalmic solution Place 1 drop into the left eye every 12 (twelve) hours.     . Brinzolamide-Brimonidine (SIMBRINZA) 1-0.2 % SUSP Place 1 drop into the left eye 2 (two) times daily.    . Cholecalciferol (VITAMIN D-3) 5000 units TABS Take 10,000 Units by mouth daily.    . Cyanocobalamin (B-12) 5000 MCG CAPS Take 1 capsule by  mouth daily.    . Difluprednate (DUREZOL) 0.05 % EMUL Place 1 drop into the left eye 2 (two) times daily.    . fluticasone (FLONASE) 50 MCG/ACT nasal spray Place 2 sprays into both nostrils daily. (Patient taking differently: Place 2 sprays into both nostrils daily as needed for allergies. ) 16 g 6  . lisinopril (PRINIVIL,ZESTRIL) 10 MG tablet Take 1 tablet (10 mg total) by mouth daily. 90 tablet 3  . Potassium 99 MG TABS Take 1 tablet by mouth daily.    . vitamin A 8000 UNIT capsule Take 8,000 Units by mouth daily.    . vitamin E 200 UNIT capsule Take 200 Units by mouth daily.     No current facility-administered medications on file prior to visit.     BP (!) 142/92   Pulse 85   Temp 98.3 F (36.8 C) (Oral)   Ht 5\' 1"  (1.549 m)   Wt 121 lb 6.4 oz (55.1 kg)   LMP  (LMP Unknown)   SpO2 97%   BMI 22.94 kg/m    Objective:   Physical Exam  Constitutional: She appears well-nourished. She does not appear ill.  HENT:  Right Ear: Tympanic membrane and ear canal normal.  Left Ear: Tympanic membrane and ear canal normal.  Nose: Mucosal edema present. Right sinus exhibits no maxillary sinus tenderness and no frontal sinus tenderness. Left sinus exhibits no maxillary sinus tenderness and no frontal sinus tenderness.  Mouth/Throat: Oropharynx is clear and moist.  Eyes: Conjunctivae are normal.  Neck: Neck supple.  Cardiovascular: Normal rate and regular rhythm.   Pulmonary/Chest: Effort normal and breath sounds normal. She has no wheezes. She has no rales.  Lymphadenopathy:    She has no cervical adenopathy.  Skin: Skin is warm and dry.          Assessment & Plan:  Allergic Rhinitis:  URI symptoms 5 days ago, overall improved. Recently started with post nasal drip, sneezing, rhinorrhea. Exam today reassuring. Clear lungs, does not appear ill.  Suspect allergy involvement. Start Zyrtec HS, Flonase, continue Mucinex. Fluids, rest, follow up PRN.  Sheral Flow, NP

## 2016-09-27 NOTE — Patient Instructions (Signed)
Your symptoms and exam today are representative of allergies.  Nasal Congestion/Ear Pressure: Start using Flonase (fluticasone) nasal spray. Instill 1 spray in each nostril twice daily.   Start taking cetirizine (Zyrtec) antihistamine tablets for runny nose, sneezing, throat drainage.  Continue Mucinex.  Please notify me if you develop persistent fevers of 101, start coughing up green mucous, notice increased fatigue or weakness, or feel worse in 1 week.  Increase consumption of water intake and rest. It was a pleasure meeting you!

## 2016-10-17 ENCOUNTER — Encounter: Payer: Self-pay | Admitting: Radiation Oncology

## 2016-10-17 ENCOUNTER — Ambulatory Visit
Admission: RE | Admit: 2016-10-17 | Discharge: 2016-10-17 | Disposition: A | Discharge status: Home / Self Care | Payer: Medicare Other | Source: Ambulatory Visit | Attending: Radiation Oncology | Admitting: Radiation Oncology

## 2016-10-17 VITALS — BP 150/90 | HR 71 | Temp 97.8°F | Wt 119.7 lb

## 2016-10-17 DIAGNOSIS — C50211 Malignant neoplasm of upper-inner quadrant of right female breast: Secondary | ICD-10-CM | POA: Insufficient documentation

## 2016-10-17 DIAGNOSIS — Z79811 Long term (current) use of aromatase inhibitors: Secondary | ICD-10-CM | POA: Insufficient documentation

## 2016-10-17 DIAGNOSIS — Z170 Estrogen receptor positive status [ER+]: Secondary | ICD-10-CM | POA: Insufficient documentation

## 2016-10-17 NOTE — Progress Notes (Signed)
Radiation Oncology Follow up Note  Name: Sandra [name]   Date:   10/17/2016 MRN:  [id number] DOB: [birth year]    This 71 y.o. 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 She had adjuvant chemotherapy with Cytoxan and Adriamycin based on high risk of recurrence on MammaPrint. She is seen today in routine follow-up and is doing well. She is currently on anastrozole and tolerating that well without side effect.. Follow-up mammogram back in November 2017 was BI-RADS 2 benign. She specifically denies breast tenderness cough or bone pain.  COMPLICATIONS OF TREATMENT: none  FOLLOW UP COMPLIANCE: keeps appointments   PHYSICAL EXAM:  BP (!) 150/90   Pulse 71   Temp 97.8 F (36.6 C)   Wt 119 lb 11.4 oz (54.3 kg)   LMP  (LMP Unknown)   BMI 22.62 kg/m  On examination of her right breast there is some calcifications present in lumpectomy site. I believe this is unchanged from prior examination. No other dominant mass or nodularity is noted in either breast in 2 positions examined. No axillary or supraclavicular adenopathy is appreciated. Well-developed well-nourished patient in NAD. HEENT reveals PERLA, EOMI, discs not visualized.  Oral cavity is clear. No oral mucosal lesions are identified. Neck is clear without evidence of cervical or supraclavicular adenopathy. Lungs are clear to A&P. Cardiac examination is essentially unremarkable with regular rate and rhythm without murmur rub or thrill. Abdomen is benign with no organomegaly or masses noted. Motor sensory and DTR levels are equal and symmetric in the upper and lower  extremities. Cranial nerves II through XII are grossly intact. Proprioception is intact. No peripheral adenopathy or edema is identified. No motor or sensory levels are noted. Crude visual fields are within normal range.  RADIOLOGY RESULTS: Mammograms are reviewed and compatible with the above-stated findings  PLAN: At the present time patient is doing well. Not really concerned but am asking the patient to see her primary surgeon for confirmation of mass in her lumpectomy site. Otherwise I've asked to see her back in 6 months for follow-up. Patient knows to call sooner with any concerns. She continues on anti-estrogen therapy without side effect.  I would like to take this opportunity to thank you for allowing me to participate in the care of your patient.Armstead Peaks., MD

## 2016-10-24 ENCOUNTER — Ambulatory Visit (INDEPENDENT_AMBULATORY_CARE_PROVIDER_SITE_OTHER): Payer: Medicare Other | Admitting: Primary Care

## 2016-10-24 ENCOUNTER — Encounter: Payer: Self-pay | Admitting: Primary Care

## 2016-10-24 ENCOUNTER — Encounter: Payer: Self-pay | Admitting: *Deleted

## 2016-10-24 VITALS — BP 112/76 | HR 86 | Temp 97.4°F | Wt 119.2 lb

## 2016-10-24 DIAGNOSIS — K5900 Constipation, unspecified: Secondary | ICD-10-CM

## 2016-10-24 NOTE — Progress Notes (Signed)
Subjective:    Patient ID: Sandra [name], female    DOB: 10/14/1945, 71 y.o.   MRN: [id number]  HPI  Ms. Amster is a 71 year old female with a history of chronic leukopenia, breast cancer, hypertension who presents today with a chief complaint of constipation. She first noticed over the last 1 week. She's taken Miralax (3 days total) with improvement after the third dose, last dose was Tuesday this week. Her last bowel movement was yesterday. Prior to 1 week ago she was have bowel movements 2-3 times daily, now she's experiencing bowel movements once daily but on a smaller scale with harder stools. She denies changes in medications, foods. She drinks only water and eats vegetables/beans. She denies nausea/vomiting, bloody stools, abdominal pain.  Review of Systems  Constitutional: Negative for fever.  Gastrointestinal: Positive for constipation. Negative for abdominal pain, blood in stool, diarrhea and nausea.       Past Medical History:  Diagnosis Date  . Cancer ([place])   . Hyperlipidemia   . Hypertension   . Visual blurriness    SOMETHING NEW THAT HAS DEVELOPED SINCE 07-13-15 SURGERY- SEEN AT [facility] ENT AND EXAM WAS NORMAL PER PT (08-09-15)     Social History   Social History  . Marital status: Married    Spouse name: N/A  . Number of children: N/A  . Years of education: N/A   Occupational History  . Not on file.   Social History Main Topics  . Smoking status: Never Smoker  . Smokeless tobacco: Never Used  . Alcohol use No  . Drug use: No  . Sexual activity: Not on file   Other Topics Concern  . Not on file   Social History Narrative   End of life wishes (updated 01/2013)-   Daughter of wishes (Cherlyl Duwayne Heck)      Desires CPR.   Desires life support if reasonable.             Past Surgical History:  Procedure Laterality Date  . ABDOMINAL HYSTERECTOMY  1987  . BREAST BIOPSY Right    neg  . BREAST BIOPSY Left    4 oclock, FIBROEPITHELIAL  LESION FAVOR CELLULAR FIBROADENOMA  . BREAST BIOPSY Left    2 oclock, CYSTIC APOCRINE METAPLASIA  . BREAST BIOPSY Right 07/13/15   130 INVASIVE MAMMARY CARCINOMA, NO SPECIAL TYPE  . BREAST LUMPECTOMY Left 07/13/2015   Procedure: BREAST LUMPECTOMY;  Surgeon: Christene Lye, MD;  Location: [facility] ORS;  Service: General;  Laterality: Left;  . BREAST LUMPECTOMY WITH SENTINEL LYMPH NODE BIOPSY Right 07/13/2015   Procedure: BREAST LUMPECTOMY WITH SENTINEL LYMPH NODE BX;  Surgeon: Christene Lye, MD;  Location: [facility] ORS;  Service: General;  Laterality: Right;  . BUNIONECTOMY Right 1993  . CATARACT EXTRACTION W/PHACO Left 09/17/2016   Procedure: CATARACT EXTRACTION PHACO AND INTRAOCULAR LENS PLACEMENT (IOC);  Surgeon: Birder Robson, MD;  Location: [facility] ORS;  Service: Ophthalmology;  Laterality: Left;  [place] 00:47 AP% 17.6 CDE 8.39 Fluid pack lot # [id number]  . fatty tumor Left 2013   side  . PORTACATH PLACEMENT Left 08/16/2015   Procedure: INSERTION PORT-A-CATH;  Surgeon: Christene Lye, MD;  Location: [facility] ORS;  Service: General;  Laterality: Left;    Family History  Problem Relation Age of Onset  . Cancer Sister 49    breast cancer  . Breast cancer Sister 30  . Colon cancer Neg Hx     Allergies  Allergen Reactions  . Lovastatin Anaphylaxis  Tongue swelling  . Cymbalta [Duloxetine Hcl]     glaucoma    Current Outpatient Prescriptions on File Prior to Visit  Medication Sig Dispense Refill  . amLODipine (NORVASC) 5 MG tablet Take 1 tablet (5 mg total) by mouth daily. 90 tablet 2  . anastrozole (ARIMIDEX) 1 MG tablet TAKE ONE TABLET BY MOUTH ONCE DAILY 30 tablet 3  . brimonidine-timolol (COMBIGAN) 0.2-0.5 % ophthalmic solution Place 1 drop into the left eye every 12 (twelve) hours.     . Brinzolamide-Brimonidine (SIMBRINZA) 1-0.2 % SUSP Place 1 drop into the left eye 2 (two) times daily.    . Cholecalciferol (VITAMIN D-3) 5000 units TABS Take 10,000 Units by mouth  daily.    . Difluprednate (DUREZOL) 0.05 % EMUL Place 1 drop into the left eye 2 (two) times daily.    . fluticasone (FLONASE) 50 MCG/ACT nasal spray Place 2 sprays into both nostrils daily. (Patient taking differently: Place 2 sprays into both nostrils daily as needed for allergies. ) 16 g 6  . lisinopril (PRINIVIL,ZESTRIL) 10 MG tablet Take 1 tablet (10 mg total) by mouth daily. 90 tablet 3  . Potassium 99 MG TABS Take 1 tablet by mouth daily.    . vitamin A 8000 UNIT capsule Take 8,000 Units by mouth daily.    . vitamin E 200 UNIT capsule Take 200 Units by mouth daily.     No current facility-administered medications on file prior to visit.     BP 112/76   Pulse 86   Temp 97.4 F (36.3 C) (Oral)   Wt 119 lb 4 oz (54.1 kg)   LMP  (LMP Unknown)   SpO2 99%   BMI 22.53 kg/m    Objective:   Physical Exam  Constitutional: She appears well-nourished.  Neck: Neck supple.  Cardiovascular: Normal rate and regular rhythm.   Pulmonary/Chest: Effort normal and breath sounds normal.  Abdominal: Soft. Bowel sounds are normal. There is no tenderness.  Skin: Skin is warm and dry.          Assessment & Plan:  Constipation:  Change in bowel movements from typical regimen. Daily bowel movements that are smaller and more firm. Exam today unremarkable, do not suspect obstruction. Will have her take Miralax daily for the next 4-5 days, stop if diarrhea. Discussed use of colace for an alternative, but not together. Handout provided for proper fiber intake. Continue water and regular exercise. She will update if symptoms persist.   Sheral Flow, NP

## 2016-10-24 NOTE — Patient Instructions (Signed)
Resume Miralax daily for the next 4-5 days up to 2 weeks total. Stop taking this if you experience diarrhea.  You could try docusate sodium (Colace) capsules rather than Miralax if you so desire. This is a stool softener.  Ensure you are getting enough fiber and water in your diet.  Continue regular exercise.  Please don't hesitate to call me with any questions.  It was a pleasure to see you today!   High-Fiber Diet Fiber, also called dietary fiber, is a type of carbohydrate found in fruits, vegetables, whole grains, and beans. A high-fiber diet can have many health benefits. Your health care provider may recommend a high-fiber diet to help:  Prevent constipation. Fiber can make your bowel movements more regular.  Lower your cholesterol.  Relieve hemorrhoids, uncomplicated diverticulosis, or irritable bowel syndrome.  Prevent overeating as part of a weight-loss plan.  Prevent heart disease, type 2 diabetes, and certain cancers. What is my plan? The recommended daily intake of fiber includes:  38 grams for men under age 63.  88 grams for men over age 39.  90 grams for women under age 51.  55 grams for women over age 38. You can get the recommended daily intake of dietary fiber by eating a variety of fruits, vegetables, grains, and beans. Your health care provider may also recommend a fiber supplement if it is not possible to get enough fiber through your diet. What do I need to know about a high-fiber diet?  Fiber supplements have not been widely studied for their effectiveness, so it is better to get fiber through food sources.  Always check the fiber content on thenutrition facts label of any prepackaged food. Look for foods that contain at least 5 grams of fiber per serving.  Ask your dietitian if you have questions about specific foods that are related to your condition, especially if those foods are not listed in the following section.  Increase your daily fiber  consumption gradually. Increasing your intake of dietary fiber too quickly may cause bloating, cramping, or gas.  Drink plenty of water. Water helps you to digest fiber. What foods can I eat? Grains  Whole-grain breads. Multigrain cereal. Oats and oatmeal. Brown rice. Barley. Bulgur wheat. [place]en. Bran muffins. Popcorn. Rye wafer crackers. Vegetables  Sweet potatoes. Spinach. Kale. Artichokes. Cabbage. Broccoli. Green peas. Carrots. Squash. Fruits  Berries. Pears. Apples. Oranges. Avocados. Prunes and raisins. Dried figs. Meats and Other Protein Sources  [place]y, kidney, pinto, and soy beans. Split peas. Lentils. Nuts and seeds. Dairy  Fiber-fortified yogurt. Beverages  Fiber-fortified soy milk. Fiber-fortified orange juice. Other  Fiber bars. The items listed above may not be a complete list of recommended foods or beverages. Contact your dietitian for more options.  What foods are not recommended? Grains  White bread. Pasta made with refined flour. White rice. Vegetables  Fried potatoes. Canned vegetables. Well-cooked vegetables. Fruits  Fruit juice. Cooked, strained fruit. Meats and Other Protein Sources  Fatty cuts of meat. Fried Sales executive or fried fish. Dairy  Milk. Yogurt. Cream cheese. Sour cream. Beverages  Soft drinks. Other  Cakes and pastries. Butter and oils. The items listed above may not be a complete list of foods and beverages to avoid. Contact your dietitian for more information.  What are some tips for including high-fiber foods in my diet?  Eat a wide variety of high-fiber foods.  Make sure that half of all grains consumed each day are whole grains.  Replace breads and cereals made from refined  flour or white flour with whole-grain breads and cereals.  Replace white rice with brown rice, bulgur wheat, or millet.  Start the day with a breakfast that is high in fiber, such as a cereal that contains at least 5 grams of fiber per serving.  Use beans in place  of meat in soups, salads, or pasta.  Eat high-fiber snacks, such as berries, raw vegetables, nuts, or popcorn. This information is not intended to replace advice given to you by your health care provider. Make sure you discuss any questions you have with your health care provider. Document Released: 07/22/2005 Document Revised: 12/28/2015 Document Reviewed: 01/04/2014 Elsevier Interactive Patient Education  2017 [employer].

## 2016-10-24 NOTE — Progress Notes (Signed)
Pre visit review using our clinic review tool, if applicable. No additional management support is needed unless otherwise documented below in the visit note. 

## 2016-10-31 ENCOUNTER — Ambulatory Visit (INDEPENDENT_AMBULATORY_CARE_PROVIDER_SITE_OTHER): Payer: Medicare Other | Admitting: General Surgery

## 2016-10-31 ENCOUNTER — Encounter: Payer: Self-pay | Admitting: General Surgery

## 2016-10-31 VITALS — BP 118/78 | HR 68 | Resp 12 | Ht 61.0 in | Wt 120.0 lb

## 2016-10-31 DIAGNOSIS — Z170 Estrogen receptor positive status [ER+]: Secondary | ICD-10-CM | POA: Diagnosis not present

## 2016-10-31 DIAGNOSIS — C50911 Malignant neoplasm of unspecified site of right female breast: Secondary | ICD-10-CM | POA: Diagnosis not present

## 2016-10-31 NOTE — Patient Instructions (Signed)
Patient to followup as scheduled.

## 2016-10-31 NOTE — Progress Notes (Signed)
Patient ID: Sandra [name], female   DOB: 02/17/1946, 71 y.o.   MRN: [id number]  Chief Complaint  Patient presents with  . Other    HPI Sandra [name] is a 71 y.o. female here today at request of Dr. Baruch Gouty. He noted a small nodule in right breast lumpectomy site. I have reviewed the history of present illness with the patient.  HPI  Past Medical History:  Diagnosis Date  . Hyperlipidemia   . Hypertension   . Visual blurriness    SOMETHING NEW THAT HAS DEVELOPED SINCE 07-13-15 SURGERY- SEEN AT [facility] ENT AND EXAM WAS NORMAL PER PT (08-09-15)    Past Surgical History:  Procedure Laterality Date  . ABDOMINAL HYSTERECTOMY  1987  . BREAST BIOPSY Right    neg  . BREAST BIOPSY Left    4 oclock, FIBROEPITHELIAL LESION FAVOR CELLULAR FIBROADENOMA  . BREAST BIOPSY Left    2 oclock, CYSTIC APOCRINE METAPLASIA  . BREAST BIOPSY Right 07/13/15   130 INVASIVE MAMMARY CARCINOMA, NO SPECIAL TYPE  . BREAST LUMPECTOMY Left 07/13/2015  . BREAST LUMPECTOMY WITH SENTINEL LYMPH NODE BIOPSY Right 07/13/2015   Procedure: BREAST LUMPECTOMY WITH SENTINEL LYMPH NODE BX;  Surgeon: Christene Lye, MD;  Location: [facility] ORS;  Service: General;  Laterality: Right;  . BUNIONECTOMY Right 1993  . CATARACT EXTRACTION W/PHACO Left 09/17/2016   Procedure: CATARACT EXTRACTION PHACO AND INTRAOCULAR LENS PLACEMENT (IOC);  Surgeon: Birder Robson, MD;  Location: [facility] ORS;  Service: Ophthalmology;  Laterality: Left;  [place] 00:47AP% 17.6CDE 8.39Fluid pack lot # [id number]  . fatty tumor Left 2013   side  . PORTACATH PLACEMENT Left 08/16/2015   Procedure: INSERTION PORT-A-CATH;  Surgeon: Christene Lye, MD;  Location: [facility] ORS;  Service: General;  Laterality: Left;    Family History  Problem Relation Age of Onset  . Cancer Sister 29    breast cancer  . Breast cancer Sister 49  . Colon cancer Neg Hx     Social History Social History  Substance Use Topics  . Smoking status: Never  Smoker  . Smokeless tobacco: Never Used  . Alcohol use No    Allergies  Allergen Reactions  . Lovastatin Anaphylaxis    Tongue swelling  . Cymbalta [Duloxetine Hcl]     glaucoma    Current Outpatient Prescriptions  Medication Sig Dispense Refill  . amLODipine (NORVASC) 5 MG tablet Take 1 tablet (5 mg total) by mouth daily. 90 tablet 2  . anastrozole (ARIMIDEX) 1 MG tablet TAKE ONE TABLET BY MOUTH ONCE DAILY 30 tablet 3  . b complex vitamins tablet Take 1 tablet by mouth daily.    . brimonidine-timolol (COMBIGAN) 0.2-0.5 % ophthalmic solution Place 1 drop into the left eye every 12 (twelve) hours.     . Brinzolamide-Brimonidine (SIMBRINZA) 1-0.2 % SUSP Place 1 drop into the left eye 2 (two) times daily.    . Cholecalciferol (VITAMIN D-3) 5000 units TABS Take 10,000 Units by mouth daily.    . Difluprednate (DUREZOL) 0.05 % EMUL Place 1 drop into the left eye 2 (two) times daily.    . fluticasone (FLONASE) 50 MCG/ACT nasal spray Place 2 sprays into both nostrils daily. (Patient taking differently: Place 2 sprays into both nostrils daily as needed for allergies. ) 16 g 6  . lisinopril (PRINIVIL,ZESTRIL) 10 MG tablet Take 1 tablet (10 mg total) by mouth daily. 90 tablet 3  . Potassium 99 MG TABS Take 1 tablet by mouth daily.    . vitamin  A 8000 UNIT capsule Take 8,000 Units by mouth daily.    . vitamin E 200 UNIT capsule Take 200 Units by mouth daily.     No current facility-administered medications for this visit.     Review of Systems Review of Systems  Constitutional: Negative.   Respiratory: Negative.   Cardiovascular: Negative.     Blood pressure 118/78, pulse 68, resp. rate 12, height 5\' 1"  (1.549 m), weight 54.4 kg (120 lb).  Physical Exam Physical Exam  Constitutional: She is oriented to person, place, and time. She appears well-developed and well-nourished.  Pulmonary/Chest:    Lymphadenopathy:    She has no cervical adenopathy.    She has no axillary adenopathy.    Neurological: She is alert and oriented to person, place, and time.  Skin: Skin is warm and dry.    Data Reviewed Prior notes reviewed   Assessment    No cause for concern. Follow up as scheduled in November this yr.    Plan    Patient to follow up as scheduled    This information has been scribed by Gaspar Cola CMA.    Hajime Asfaw G 11/04/2016, 8:37 AM

## 2016-11-08 ENCOUNTER — Inpatient Hospital Stay: Payer: Medicare Other | Attending: Internal Medicine | Admitting: Internal Medicine

## 2016-11-08 ENCOUNTER — Inpatient Hospital Stay (HOSPITAL_BASED_OUTPATIENT_CLINIC_OR_DEPARTMENT_OTHER): Payer: Medicare Other

## 2016-11-08 VITALS — BP 122/81 | HR 67 | Temp 93.4°F | Wt 117.4 lb

## 2016-11-08 DIAGNOSIS — Z803 Family history of malignant neoplasm of breast: Secondary | ICD-10-CM | POA: Insufficient documentation

## 2016-11-08 DIAGNOSIS — M8580 Other specified disorders of bone density and structure, unspecified site: Secondary | ICD-10-CM | POA: Diagnosis not present

## 2016-11-08 DIAGNOSIS — Z170 Estrogen receptor positive status [ER+]: Secondary | ICD-10-CM | POA: Insufficient documentation

## 2016-11-08 DIAGNOSIS — Z79811 Long term (current) use of aromatase inhibitors: Secondary | ICD-10-CM | POA: Insufficient documentation

## 2016-11-08 DIAGNOSIS — C50211 Malignant neoplasm of upper-inner quadrant of right female breast: Secondary | ICD-10-CM | POA: Diagnosis not present

## 2016-11-08 DIAGNOSIS — G620 Drug-induced polyneuropathy: Secondary | ICD-10-CM | POA: Insufficient documentation

## 2016-11-08 DIAGNOSIS — E785 Hyperlipidemia, unspecified: Secondary | ICD-10-CM | POA: Insufficient documentation

## 2016-11-08 DIAGNOSIS — I10 Essential (primary) hypertension: Secondary | ICD-10-CM | POA: Diagnosis not present

## 2016-11-08 DIAGNOSIS — Z79899 Other long term (current) drug therapy: Secondary | ICD-10-CM | POA: Insufficient documentation

## 2016-11-08 DIAGNOSIS — T451X5S Adverse effect of antineoplastic and immunosuppressive drugs, sequela: Secondary | ICD-10-CM | POA: Diagnosis not present

## 2016-11-08 DIAGNOSIS — Z9221 Personal history of antineoplastic chemotherapy: Secondary | ICD-10-CM | POA: Diagnosis not present

## 2016-11-08 DIAGNOSIS — D72819 Decreased white blood cell count, unspecified: Secondary | ICD-10-CM | POA: Insufficient documentation

## 2016-11-08 LAB — CBC WITH DIFFERENTIAL/PLATELET
BASOS PCT: 2 %
Basophils Absolute: 0.1 10*3/uL (ref 0–0.1)
Eosinophils Absolute: 0.2 10*3/uL (ref 0–0.7)
Eosinophils Relative: 6 %
HEMATOCRIT: 36.9 % (ref 35.0–47.0)
HEMOGLOBIN: 12.6 g/dL (ref 12.0–16.0)
LYMPHS ABS: 0.8 10*3/uL — AB (ref 1.0–3.6)
Lymphocytes Relative: 31 %
MCH: 29.9 pg (ref 26.0–34.0)
MCHC: 34.1 g/dL (ref 32.0–36.0)
MCV: 87.5 fL (ref 80.0–100.0)
MONOS PCT: 9 %
Monocytes Absolute: 0.2 10*3/uL (ref 0.2–0.9)
NEUTROS ABS: 1.3 10*3/uL — AB (ref 1.4–6.5)
NEUTROS PCT: 52 %
Platelets: 277 10*3/uL (ref 150–440)
RBC: 4.21 MIL/uL (ref 3.80–5.20)
RDW: 13.2 % (ref 11.5–14.5)
WBC: 2.6 10*3/uL — ABNORMAL LOW (ref 3.6–11.0)

## 2016-11-08 LAB — BASIC METABOLIC PANEL
Anion gap: 6 (ref 5–15)
BUN: 14 mg/dL (ref 6–20)
CALCIUM: 9.4 mg/dL (ref 8.9–10.3)
CHLORIDE: 106 mmol/L (ref 101–111)
CO2: 26 mmol/L (ref 22–32)
CREATININE: 0.85 mg/dL (ref 0.44–1.00)
GFR calc Af Amer: >60 mL/min (ref >60)
GFR calc non Af Amer: >60 mL/min (ref >60)
Glucose, Bld: 108 mg/dL — ABNORMAL HIGH (ref 65–99)
Potassium: 3.7 mmol/L (ref 3.5–5.1)
Sodium: 138 mmol/L (ref 135–145)

## 2016-11-08 NOTE — Progress Notes (Signed)
[place] @ [facility] Telephone:(336) [phone]  Fax:([phone]   INITIAL CONSULT  Sandra [name] OB: 12-Jun-1946  MR#: [id number]  UVO#:[id number]  Patient Care Team: Lucille Passy, MD as PCP - General (Family Medicine) Glennie Isle, PA-C (Physician Assistant) Jerene Bears, MD as Consulting Physician (Gastroenterology) Lucille Passy, MD as Consulting Physician (Family Medicine) Seeplaputhur Robinette Haines, MD (General Surgery) Cammie Sickle, MD as Consulting Physician (Internal Medicine) Leona Singleton, RN as Oncology Nurse Navigator Noreene Filbert, MD as Referring Physician (Radiation Oncology)  CHIEF COMPLAINT:  Chief Complaint  Patient presents with  . Breast Cancer   VISIT DIAGNOSIS:     ICD-9-CM ICD-10-CM   1. Carcinoma of upper-inner quadrant of right breast in female, estrogen receptor positive ([place]) 174.2 C50.211 CBC with Differential/Platelet   V86.0 Z17.0 Comprehensive metabolic panel     Oncology History    carcinoma breast (right) inner and upper quadrant T1 cN0 M0 tumor Estrogen receptor positive progesterone receptor positive HER-2 receptor negative by fish Mammo print shows high risk (diagnosis in January of 2016) patient has 22% average risk in 5 years and 29% average risk in 10 years for distant metastectomy disease without chemotherapy on anti-hormonal treatment 2.  Patient started Cytoxan and Adriamycin on now August 30, 2015 MUGA scan shows ejection fraction of baseline 55%.  3.Patient has finished total of 4 cycles of chemotherapy on November 01, 2015 4.  Started on the Taxol from November 22, 2015; FINISHED RT [sep 2017]  # OCT 2017- START ARIMIDEX   # NOV 2017- BMD- OSTEOPENIA- ca+vit D     Carcinoma of upper-inner quadrant of right breast in female, estrogen receptor positive ([place])     INTERVAL HISTORY: 71 year old lady History of stage I right-sided breast cancer ER/PR positive currently on Arimidex is here for follow-up.  Patient  continues to complain of tingling and numbness in her hands- currently not on any medication She is currently following her chiroproctor; denies any significant improvement. Currently stable. She noted to have a small lump at the area of incision; evaluated by Dr. Jamal Collin.  Patient denies any fevers or chills. Denies any nausea vomiting or constipation.   REVIEW OF SYSTEMS:  A complete 10 point review of system is done which is negative for mentioned above in history of present illness.  PAST MEDICAL HISTORY: Past Medical History:  Diagnosis Date  . Hyperlipidemia   . Hypertension   . Visual blurriness    SOMETHING NEW THAT HAS DEVELOPED SINCE 07-13-15 SURGERY- SEEN AT [facility] ENT AND EXAM WAS NORMAL PER PT (08-09-15)    PAST SURGICAL HISTORY: Past Surgical History:  Procedure Laterality Date  . ABDOMINAL HYSTERECTOMY  1987  . BREAST BIOPSY Right    neg  . BREAST BIOPSY Left    4 oclock, FIBROEPITHELIAL LESION FAVOR CELLULAR FIBROADENOMA  . BREAST BIOPSY Left    2 oclock, CYSTIC APOCRINE METAPLASIA  . BREAST BIOPSY Right 07/13/15   130 INVASIVE MAMMARY CARCINOMA, NO SPECIAL TYPE  . BREAST LUMPECTOMY Left 07/13/2015  . BREAST LUMPECTOMY WITH SENTINEL LYMPH NODE BIOPSY Right 07/13/2015   Procedure: BREAST LUMPECTOMY WITH SENTINEL LYMPH NODE BX;  Surgeon: Christene Lye, MD;  Location: [facility] ORS;  Service: General;  Laterality: Right;  . BUNIONECTOMY Right 1993  . CATARACT EXTRACTION W/PHACO Left 09/17/2016   Procedure: CATARACT EXTRACTION PHACO AND INTRAOCULAR LENS PLACEMENT (IOC);  Surgeon: Birder Robson, MD;  Location: [facility] ORS;  Service: Ophthalmology;  Laterality: Left;  [place] 00:47AP% 17.6CDE 8.39Fluid  pack lot # [id number]  . fatty tumor Left 2013   side  . PORTACATH PLACEMENT Left 08/16/2015   Procedure: INSERTION PORT-A-CATH;  Surgeon: Christene Lye, MD;  Location: [facility] ORS;  Service: General;  Laterality: Left;    FAMILY HISTORY Family History  Problem Relation Age  of Onset  . Cancer Sister 71    breast cancer  . Breast cancer Sister 96  . Colon cancer Neg Hx         ADVANCED DIRECTIVES:   Patient does not have any living will or healthcare power of attorney.  Information was given .  Available resources had been discussed.  We will follow-up on subsequent appointments regarding this issue HEALTH MAINTENANCE: Social History  Substance Use Topics  . Smoking status: Never Smoker  . Smokeless tobacco: Never Used  . Alcohol use No      :  Allergies  Allergen Reactions  . Lovastatin Anaphylaxis    Tongue swelling  . Cymbalta [Duloxetine Hcl]     glaucoma    Current Outpatient Prescriptions  Medication Sig Dispense Refill  . amLODipine (NORVASC) 5 MG tablet Take 1 tablet (5 mg total) by mouth daily. 90 tablet 2  . anastrozole (ARIMIDEX) 1 MG tablet TAKE ONE TABLET BY MOUTH ONCE DAILY 30 tablet 3  . b complex vitamins tablet Take 1 tablet by mouth daily.    . brimonidine-timolol (COMBIGAN) 0.2-0.5 % ophthalmic solution Place 1 drop into the left eye every 12 (twelve) hours.     . Brinzolamide-Brimonidine (SIMBRINZA) 1-0.2 % SUSP Place 1 drop into the left eye 2 (two) times daily.    . Cholecalciferol (VITAMIN D-3) 5000 units TABS Take 10,000 Units by mouth daily.    . Difluprednate (DUREZOL) 0.05 % EMUL Place 1 drop into the left eye 2 (two) times daily.    . fluticasone (FLONASE) 50 MCG/ACT nasal spray Place 2 sprays into both nostrils daily. (Patient taking differently: Place 2 sprays into both nostrils daily as needed for allergies. ) 16 g 6  . lisinopril (PRINIVIL,ZESTRIL) 10 MG tablet Take 1 tablet (10 mg total) by mouth daily. 90 tablet 3  . Potassium 99 MG TABS Take 1 tablet by mouth daily.    . vitamin A 8000 UNIT capsule Take 8,000 Units by mouth daily.    . vitamin E 200 UNIT capsule Take 200 Units by mouth daily.     No current facility-administered medications for this visit.     OBJECTIVE: PHYSICAL EXAM: GENERAL:  Well  developed, well nourished, sitting comfortably in the exam room in no acute distress. MENTAL STATUS:  Alert and oriented to person, place and time.  ENT:  Oropharynx clear without lesion.  Tongue normal. Mucous membranes moist. Alopecia.  Examination of the nose shows redness.  No active bleeding. RESPIRATORY:  Clear to auscultation without rales, wheezes or rhonchi. Patient has a port placed on the left upper chest wall area.  No evidence of redness. CARDIOVASCULAR:  Regular rate and rhythm without murmur, rub or gallop.  ABDOMEN:  Soft, non-tender, with active bowel sounds, and no hepatosplenomegaly.  No masses. BACK:  No CVA tenderness.  No tenderness on percussion of the back or rib cage. SKIN:  No rashes, ulcers or lesions. EXTREMITIES: No edema, no skin discoloration or tenderness.  No palpable cords. LYMPH NODES: No palpable cervical, supraclavicular, axillary or inguinal adenopathy  NEUROLOGICAL: Unremarkable. PSYCH:  Appropriate.  Right and left BREAST exam [in the presence of nurse]- no unusual skin changes or  dominant masses felt. Surgical scars noted. ~ 76m nodule noted.    Vitals:   11/08/16 1119  BP: 122/81  Pulse: 67  Temp: (!) 93.4 F (34.1 C)     Body mass index is 22.18 kg/m.    ECOG FS:0 - Asymptomatic  LAB RESULTS:  Appointment on 11/08/2016  Component Date Value Ref Range Status  . WBC 11/08/2016 2.6* 3.6 - 11.0 K/uL Final  . RBC 11/08/2016 4.21  3.80 - 5.20 MIL/uL Final  . Hemoglobin 11/08/2016 12.6  12.0 - 16.0 g/dL Final  . HCT 11/08/2016 36.9  35.0 - 47.0 % Final  . MCV 11/08/2016 87.5  80.0 - 100.0 fL Final  . MCH 11/08/2016 29.9  26.0 - 34.0 pg Final  . MCHC 11/08/2016 34.1  32.0 - 36.0 g/dL Final  . RDW 11/08/2016 13.2  11.5 - 14.5 % Final  . Platelets 11/08/2016 277  150 - 440 K/uL Final  . Neutrophils Relative % 11/08/2016 52  % Final  . Neutro Abs 11/08/2016 1.3* 1.4 - 6.5 K/uL Final  . Lymphocytes Relative 11/08/2016 31  % Final  . Lymphs  Abs 11/08/2016 0.8* 1.0 - 3.6 K/uL Final  . Monocytes Relative 11/08/2016 9  % Final  . Monocytes Absolute 11/08/2016 0.2  0.2 - 0.9 K/uL Final  . Eosinophils Relative 11/08/2016 6  % Final  . Eosinophils Absolute 11/08/2016 0.2  0 - 0.7 K/uL Final  . Basophils Relative 11/08/2016 2  % Final  . Basophils Absolute 11/08/2016 0.1  0 - 0.1 K/uL Final  . Sodium 11/08/2016 138  135 - 145 mmol/L Final  . Potassium 11/08/2016 3.7  3.5 - 5.1 mmol/L Final  . Chloride 11/08/2016 106  101 - 111 mmol/L Final  . CO2 11/08/2016 26  22 - 32 mmol/L Final  . Glucose, Bld 11/08/2016 108* 65 - 99 mg/dL Final  . BUN 11/08/2016 14  6 - 20 mg/dL Final  . Creatinine, Ser 11/08/2016 0.85  0.44 - 1.00 mg/dL Final  . Calcium 11/08/2016 9.4  8.9 - 10.3 mg/dL Final  . GFR calc non Af Amer 11/08/2016 >60  >60 mL/min Final  . GFR calc Af Amer 11/08/2016 >60  >60 mL/min Final   Comment: (NOTE) The eGFR has been calculated using the CKD EPI equation. This calculation has not been validated in all clinical situations. eGFR's persistently <60 mL/min signify possible Chronic Kidney Disease.   . Anion gap 11/08/2016 6  5 - 15 Final     STUDIES: No results found.  ASSESSMENT:   Carcinoma of upper-inner quadrant of right breast in female, estrogen receptor positive ([place] Stage I ER/PR positive HER-2/neu negative. Currently finished adjuvant chemotherapy. No clinical evidence of recurrence.  S/p RT. On Anastrazole; Tolerating well.    # Osteopenia- BMD Nov 2017- discussed Prolia every 6 months; patient concerned re: AEs. Since the T score was -1.2; recommend holding bisphosphonate therapy at this time. Recommend exercise program/ weights; on ca+vit D.   # mild leucopenia- monitor for now.   # Grade 2 neuropathy- stable; follow up chiroproctor; recommend evaluation with acupuncture.   # follow up in 6 months/labs.   No matching staging information was found for the patient.  GCammie Sickle MD   11/08/2016  1:12 PM

## 2016-11-08 NOTE — Assessment & Plan Note (Addendum)
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 Currently finished adjuvant chemotherapy. No clinical evidence of recurrence.  S/p RT. On Anastrazole; Tolerating well.    # Osteopenia- BMD Nov 2017- discussed Prolia every 6 months; patient concerned re: AEs. Since the T score was -1.2; recommend holding bisphosphonate therapy at this time. Recommend exercise program/ weights; on ca+vit D.   # mild leucopenia- monitor for now.   # Grade 2 neuropathy- stable; follow up chiroproctor; recommend evaluation with acupuncture.   # follow up in 6 months/labs.  

## 2016-11-08 NOTE — Progress Notes (Signed)
Patient continues to have neuropathy in her feet.  She did have cataract surgery since her last visit. Patient states she discovered a knot in her breast.  She saw Dr. Donella Stade and he referred her back to Dr. Jamal Collin.  Dr. Jamal Collin feels like it is scar tissue.

## 2017-01-02 ENCOUNTER — Encounter (INDEPENDENT_AMBULATORY_CARE_PROVIDER_SITE_OTHER): Payer: Self-pay

## 2017-01-02 ENCOUNTER — Ambulatory Visit (INDEPENDENT_AMBULATORY_CARE_PROVIDER_SITE_OTHER): Payer: Medicare Other | Admitting: Family Medicine

## 2017-01-02 VITALS — BP 134/74 | HR 67 | Wt 117.5 lb

## 2017-01-02 DIAGNOSIS — Z170 Estrogen receptor positive status [ER+]: Secondary | ICD-10-CM

## 2017-01-02 DIAGNOSIS — N6310 Unspecified lump in the right breast, unspecified quadrant: Secondary | ICD-10-CM | POA: Diagnosis not present

## 2017-01-02 DIAGNOSIS — C50211 Malignant neoplasm of upper-inner quadrant of right female breast: Secondary | ICD-10-CM

## 2017-01-02 NOTE — Assessment & Plan Note (Signed)
Already examined by oncology who felt this was benign. Discussed with pt- her comfort level with plan has to be okay so I suggested mammogram/ultrasound for pt reassurance since she is very concerned about this area. The patient indicates understanding of these issues and agrees with the plan. Orders placed.

## 2017-01-02 NOTE — Progress Notes (Signed)
Pre visit review using our clinic review tool, if applicable. No additional management support is needed unless otherwise documented below in the visit note. 

## 2017-01-02 NOTE — Progress Notes (Signed)
Subjective:   Patient ID: Sandra [name], female    DOB: [birth year], 71 y.o.   MRN: [id number]  Sandra [name] is a pleasant 71 y.o. year old female who presents to clinic today with Breast Problem (right)  on 01/02/2017  HPI:  Here for right breast mass-  Saw oncologist on 11/08/16 and mass was there- Note reviewed-  felt likely scar tissue in incision are from her lumpectomy for breast CA. Currently on arimedx.  Pt feels that mass is the same size and ? If it truly is benign.  Last mammogram 06/24/16   Current Outpatient Prescriptions on File Prior to Visit  Medication Sig Dispense Refill  . amLODipine (NORVASC) 5 MG tablet Take 1 tablet (5 mg total) by mouth daily. 90 tablet 2  . anastrozole (ARIMIDEX) 1 MG tablet TAKE ONE TABLET BY MOUTH ONCE DAILY 30 tablet 3  . b complex vitamins tablet Take 1 tablet by mouth daily.    . brimonidine-timolol (COMBIGAN) 0.2-0.5 % ophthalmic solution Place 1 drop into the left eye every 12 (twelve) hours.     . Brinzolamide-Brimonidine (SIMBRINZA) 1-0.2 % SUSP Place 1 drop into the left eye 2 (two) times daily.    . Cholecalciferol (VITAMIN D-3) 5000 units TABS Take 10,000 Units by mouth daily.    . Difluprednate (DUREZOL) 0.05 % EMUL Place 1 drop into the left eye 2 (two) times daily.    . fluticasone (FLONASE) 50 MCG/ACT nasal spray Place 2 sprays into both nostrils daily. (Patient taking differently: Place 2 sprays into both nostrils daily as needed for allergies. ) 16 g 6  . lisinopril (PRINIVIL,ZESTRIL) 10 MG tablet Take 1 tablet (10 mg total) by mouth daily. 90 tablet 3  . Potassium 99 MG TABS Take 1 tablet by mouth daily.    . vitamin A 8000 UNIT capsule Take 8,000 Units by mouth daily.    . vitamin E 200 UNIT capsule Take 200 Units by mouth daily.     No current facility-administered medications on file prior to visit.     Allergies  Allergen Reactions  . Lovastatin Anaphylaxis    Tongue swelling  . Cymbalta  [Duloxetine Hcl]     glaucoma    Past Medical History:  Diagnosis Date  . Hyperlipidemia   . Hypertension   . Visual blurriness    SOMETHING NEW THAT HAS DEVELOPED SINCE 07-13-15 SURGERY- SEEN AT [facility] AND EXAM WAS NORMAL PER PT (08-09-15)    Past Surgical History:  Procedure Laterality Date  . ABDOMINAL HYSTERECTOMY  1987  . BREAST BIOPSY Right    neg  . BREAST BIOPSY Left    4 oclock, FIBROEPITHELIAL LESION FAVOR CELLULAR FIBROADENOMA  . BREAST BIOPSY Left    2 oclock, CYSTIC APOCRINE METAPLASIA  . BREAST BIOPSY Right 07/13/15   130 INVASIVE MAMMARY CARCINOMA, NO SPECIAL TYPE  . BREAST LUMPECTOMY Left 07/13/2015  . BREAST LUMPECTOMY WITH SENTINEL LYMPH NODE BIOPSY Right 07/13/2015   Procedure: BREAST LUMPECTOMY WITH SENTINEL LYMPH NODE BX;  Surgeon: Christene Lye, MD;  Location: [facility] ORS;  Service: General;  Laterality: Right;  . BUNIONECTOMY Right 1993  . CATARACT EXTRACTION W/PHACO Left 09/17/2016   Procedure: CATARACT EXTRACTION PHACO AND INTRAOCULAR LENS PLACEMENT (IOC);  Surgeon: Birder Robson, MD;  Location: [facility] ORS;  Service: Ophthalmology;  Laterality: Left;  [place] 00:47AP% 17.6CDE 8.39Fluid pack lot # [id number]  . fatty tumor Left 2013   side  . PORTACATH PLACEMENT Left 08/16/2015   Procedure: INSERTION PORT-A-CATH;  Surgeon: Christene Lye, MD;  Location: [facility] ORS;  Service: General;  Laterality: Left;    Family History  Problem Relation Age of Onset  . Cancer Sister 61       breast cancer  . Breast cancer Sister 60  . Colon cancer Neg Hx     Social History   Social History  . Marital status: Married    Spouse name: N/A  . Number of children: N/A  . Years of education: N/A   Occupational History  . Not on file.   Social History Main Topics  . Smoking status: Never Smoker  . Smokeless tobacco: Never Used  . Alcohol use No  . Drug use: No  . Sexual activity: Not on file   Other Topics Concern  . Not on file   Social History  Narrative   End of life wishes (updated 01/2013)-   Daughter of wishes (Cherlyl Duwayne Heck)      Desires CPR.   Desires life support if reasonable.            The PMH, PSH, Social History, Family History, Medications, and allergies have been reviewed in [facility], and have been updated if relevant.  Review of Systems  Constitutional: Negative.   Neurological: Negative.   Hematological: Negative.   Psychiatric/Behavioral: Negative.   All other systems reviewed and are negative.      Objective:    BP 134/74   Pulse 67   Wt 117 lb 8 oz (53.3 kg)   LMP  (LMP Unknown)   SpO2 98%   BMI 22.20 kg/m    Physical Exam  Constitutional: She is oriented to person, place, and time. She appears well-developed and well-nourished. No distress.  HENT:  Head: Normocephalic and atraumatic.  Eyes: Conjunctivae are normal.  Cardiovascular: Normal rate.   Pulmonary/Chest: Effort normal.    Musculoskeletal: Normal range of motion.  Neurological: She is alert and oriented to person, place, and time. No cranial nerve deficit.  Skin: Skin is warm and dry. She is not diaphoretic.  Psychiatric: She has a normal mood and affect. Her behavior is normal. Judgment and thought content normal.  Nursing note and vitals reviewed.         Assessment & Plan:   Carcinoma of upper-inner quadrant of right breast in female, estrogen receptor positive ([place]) - Plan: US BREAST LTD UNI RIGHT INC AXILLA, MM DIAG BREAST TOMO BILATERAL  Breast mass, right - Plan: US BREAST LTD UNI RIGHT INC AXILLA, MM DIAG BREAST TOMO BILATERAL No Follow-up on file.

## 2017-01-02 NOTE — Patient Instructions (Signed)
Good to see you. Please call [facility] to schedule your mammogram.

## 2017-01-02 NOTE — Addendum Note (Signed)
Addended by: Pilar Grammes on: 01/02/2017 03:27 PM   Modules accepted: Orders

## 2017-01-06 ENCOUNTER — Other Ambulatory Visit: Payer: Self-pay | Admitting: Family Medicine

## 2017-01-06 ENCOUNTER — Other Ambulatory Visit: Payer: Self-pay | Admitting: *Deleted

## 2017-01-06 DIAGNOSIS — Z95828 Presence of other vascular implants and grafts: Secondary | ICD-10-CM

## 2017-01-06 DIAGNOSIS — C50211 Malignant neoplasm of upper-inner quadrant of right female breast: Secondary | ICD-10-CM

## 2017-01-06 DIAGNOSIS — Z170 Estrogen receptor positive status [ER+]: Principal | ICD-10-CM

## 2017-01-06 MED ORDER — ANASTROZOLE 1 MG PO TABS: 1.0000 mg | ORAL_TABLET | Freq: Every day | ORAL | 1 refills | Status: DC

## 2017-01-10 ENCOUNTER — Other Ambulatory Visit: Payer: Self-pay | Admitting: Family Medicine

## 2017-01-10 ENCOUNTER — Ambulatory Visit
Admission: RE | Admit: 2017-01-10 | Discharge: 2017-01-10 | Disposition: A | Discharge status: Home / Self Care | Payer: Medicare Other | Source: Ambulatory Visit | Attending: Family Medicine | Admitting: Family Medicine

## 2017-01-10 DIAGNOSIS — R921 Mammographic calcification found on diagnostic imaging of breast: Secondary | ICD-10-CM | POA: Diagnosis not present

## 2017-01-10 DIAGNOSIS — Z853 Personal history of malignant neoplasm of breast: Secondary | ICD-10-CM | POA: Insufficient documentation

## 2017-01-10 DIAGNOSIS — C50211 Malignant neoplasm of upper-inner quadrant of right female breast: Secondary | ICD-10-CM

## 2017-01-10 DIAGNOSIS — Z170 Estrogen receptor positive status [ER+]: Secondary | ICD-10-CM

## 2017-01-10 DIAGNOSIS — N6310 Unspecified lump in the right breast, unspecified quadrant: Secondary | ICD-10-CM | POA: Diagnosis not present

## 2017-01-10 HISTORY — DX: Malignant neoplasm of unspecified site of unspecified female breast: C50.919

## 2017-01-15 ENCOUNTER — Encounter: Payer: Self-pay | Admitting: *Deleted

## 2017-01-21 ENCOUNTER — Ambulatory Visit: Payer: Medicare Other | Admitting: Anesthesiology

## 2017-01-21 ENCOUNTER — Encounter
Admission: RE | Disposition: A | Discharge status: Home / Self Care | Payer: Self-pay | Source: Ambulatory Visit | Attending: Ophthalmology

## 2017-01-21 ENCOUNTER — Ambulatory Visit
Admission: RE | Admit: 2017-01-21 | Discharge: 2017-01-21 | Disposition: A | Discharge status: Home / Self Care | Payer: Medicare Other | Source: Ambulatory Visit | Attending: Ophthalmology | Admitting: Ophthalmology

## 2017-01-21 ENCOUNTER — Encounter: Payer: Self-pay | Admitting: *Deleted

## 2017-01-21 DIAGNOSIS — Z90710 Acquired absence of both cervix and uterus: Secondary | ICD-10-CM | POA: Diagnosis not present

## 2017-01-21 DIAGNOSIS — G629 Polyneuropathy, unspecified: Secondary | ICD-10-CM | POA: Insufficient documentation

## 2017-01-21 DIAGNOSIS — Z853 Personal history of malignant neoplasm of breast: Secondary | ICD-10-CM | POA: Insufficient documentation

## 2017-01-21 DIAGNOSIS — H2511 Age-related nuclear cataract, right eye: Secondary | ICD-10-CM | POA: Diagnosis present

## 2017-01-21 DIAGNOSIS — E7800 Pure hypercholesterolemia, unspecified: Secondary | ICD-10-CM | POA: Diagnosis not present

## 2017-01-21 DIAGNOSIS — Z888 Allergy status to other drugs, medicaments and biological substances status: Secondary | ICD-10-CM | POA: Diagnosis not present

## 2017-01-21 DIAGNOSIS — I10 Essential (primary) hypertension: Secondary | ICD-10-CM | POA: Insufficient documentation

## 2017-01-21 HISTORY — PX: CATARACT EXTRACTION W/PHACO: SHX586

## 2017-01-21 HISTORY — DX: Polyneuropathy, unspecified: G62.9

## 2017-01-21 SURGERY — PHACOEMULSIFICATION, CATARACT, WITH IOL INSERTION
Anesthesia: Monitor Anesthesia Care | Site: Eye | Laterality: Right | Wound class: Clean

## 2017-01-21 MED ORDER — EPINEPHRINE PF 1 MG/ML IJ SOLN
INTRAMUSCULAR | Status: AC
  Filled 2017-01-21: qty 2

## 2017-01-21 MED ORDER — EPINEPHRINE PF 1 MG/ML IJ SOLN
INTRAOCULAR | Status: DC | PRN
  Administered 2017-01-21: 1 mL via OPHTHALMIC

## 2017-01-21 MED ORDER — LIDOCAINE HCL (PF) 4 % IJ SOLN
INTRAMUSCULAR | Status: DC | PRN
  Administered 2017-01-21: 13:00:00 via OPHTHALMIC

## 2017-01-21 MED ORDER — MOXIFLOXACIN HCL 0.5 % OP SOLN
OPHTHALMIC | Status: DC | PRN
  Administered 2017-01-21: .2 mL via OPHTHALMIC

## 2017-01-21 MED ORDER — MIDAZOLAM HCL 2 MG/2ML IJ SOLN
INTRAMUSCULAR | Status: DC | PRN
  Administered 2017-01-21 (×2): 1 mg via INTRAVENOUS

## 2017-01-21 MED ORDER — NA CHONDROIT SULF-NA HYALURON 40-17 MG/ML IO SOLN
INTRAOCULAR | Status: DC | PRN
  Administered 2017-01-21: 1 mL via INTRAOCULAR

## 2017-01-21 MED ORDER — LIDOCAINE HCL (PF) 4 % IJ SOLN
INTRAMUSCULAR | Status: AC
  Filled 2017-01-21: qty 5

## 2017-01-21 MED ORDER — POVIDONE-IODINE 5 % OP SOLN
OPHTHALMIC | Status: DC | PRN
  Administered 2017-01-21: 1 via OPHTHALMIC

## 2017-01-21 MED ORDER — MOXIFLOXACIN HCL 0.5 % OP SOLN: 1.0000 [drp] | OPHTHALMIC | Status: DC | PRN

## 2017-01-21 MED ORDER — ARMC OPHTHALMIC DILATING DROPS
OPHTHALMIC | Status: AC
  Administered 2017-01-21: 1 via OPHTHALMIC
  Filled 2017-01-21: qty 0.4

## 2017-01-21 MED ORDER — ARMC OPHTHALMIC DILATING DROPS
1.0000 "application " | OPHTHALMIC | Status: AC
  Administered 2017-01-21 (×3): 1 via OPHTHALMIC

## 2017-01-21 MED ORDER — SODIUM CHLORIDE 0.9 % IV SOLN
INTRAVENOUS | Status: DC
  Administered 2017-01-21: 20 mL/h via INTRAVENOUS
  Administered 2017-01-21: 13:00:00 via INTRAVENOUS

## 2017-01-21 MED ORDER — MOXIFLOXACIN HCL 0.5 % OP SOLN
OPHTHALMIC | Status: AC
  Filled 2017-01-21: qty 3

## 2017-01-21 MED ORDER — CARBACHOL 0.01 % IO SOLN
INTRAOCULAR | Status: DC | PRN
  Administered 2017-01-21: .5 mL via INTRAOCULAR

## 2017-01-21 MED ORDER — POVIDONE-IODINE 5 % OP SOLN
OPHTHALMIC | Status: AC
  Filled 2017-01-21: qty 30

## 2017-01-21 MED ORDER — MIDAZOLAM HCL 2 MG/2ML IJ SOLN
INTRAMUSCULAR | Status: AC
Start: 2017-01-21 — End: 2017-01-21
  Filled 2017-01-21: qty 2

## 2017-01-21 MED ORDER — NA CHONDROIT SULF-NA HYALURON 40-17 MG/ML IO SOLN
INTRAOCULAR | Status: AC
  Filled 2017-01-21: qty 1

## 2017-01-21 SURGICAL SUPPLY — 14 items
GLOVE BIO SURGEON STRL SZ8 (GLOVE) ×3 IMPLANT
GLOVE BIOGEL M 6.5 STRL (GLOVE) ×3 IMPLANT
GLOVE SURG LX 8.0 MICRO (GLOVE) ×2
GLOVE SURG LX STRL 8.0 MICRO (GLOVE) ×1 IMPLANT
GOWN STRL REUS W/ TWL LRG LVL3 (GOWN DISPOSABLE) ×2 IMPLANT
GOWN STRL REUS W/TWL LRG LVL3 (GOWN DISPOSABLE) ×6
LENS IOL TECNIS ITEC 25.0 (Intraocular Lens) ×2 IMPLANT
PACK CATARACT (MISCELLANEOUS) ×3 IMPLANT
PACK CATARACT BRASINGTON LX (MISCELLANEOUS) ×3 IMPLANT
SOL BSS BAG (MISCELLANEOUS) ×3
SOLUTION BSS BAG (MISCELLANEOUS) ×1 IMPLANT
SYR 5ML LL (SYRINGE) ×3 IMPLANT
WATER STERILE IRR 250ML POUR (IV SOLUTION) ×3 IMPLANT
WIPE NON LINTING 3.25X3.25 (MISCELLANEOUS) ×3 IMPLANT

## 2017-01-21 NOTE — H&P (Signed)
All labs reviewed. Abnormal studies sent to patients PCP when indicated.  Previous H&P reviewed, patient examined, there are NO CHANGES.  Sandra Jorge LOUIS6/19/20181:03 PM

## 2017-01-21 NOTE — Discharge Instructions (Signed)
Eye Surgery Discharge Instructions  Expect mild scratchy sensation or mild soreness. DO NOT RUB YOUR EYE!  The day of surgery:  Minimal physical activity, but bed rest is not required  No reading, computer work, or close hand work  No bending, lifting, or straining.  May watch TV  For 24 hours:  No driving, legal decisions, or alcoholic beverages  Safety precautions  Eat anything you prefer: It is better to start with liquids, then soup then solid foods.  _____ Eye patch should be worn until postoperative exam tomorrow.  ____ Solar shield eyeglasses should be worn for comfort in the sunlight/patch while sleeping  Resume all regular medications including aspirin or Coumadin if these were discontinued prior to surgery. You may shower, bathe, shave, or wash your hair. Tylenol may be taken for mild discomfort.  Call your doctor if you experience significant pain, nausea, or vomiting, fever > 101 or other signs of infection. [phone] or [phone] Specific instructions:  Follow-up Information    Birder Robson, MD Follow up.   Specialty:  Ophthalmology Why:  June 20 at 10:40am Contact information: [address] [place] [address] [phone]

## 2017-01-21 NOTE — Transfer of Care (Signed)
Immediate Anesthesia Transfer of Care Note  Patient: Sandra [name]  Procedure(s) Performed: Procedure(s) with comments: CATARACT EXTRACTION PHACO AND INTRAOCULAR LENS PLACEMENT (IOC) (Right) - [place] 00:39 AP% 13.0 CDE 5.16 fluid pack lot # [id number]  Patient Location: PACU  Anesthesia Type:MAC  Level of Consciousness: sedated  Airway & Oxygen Therapy: Patient Spontanous Breathing  Post-op Assessment: Report given to RN and Post -op Vital signs reviewed and stable  Post vital signs: Reviewed and stable  Last Vitals:  Vitals:   01/15/17 1429 01/21/17 1135  BP: 125/71 (!) 155/77  Pulse: 77 78  Resp:  15  Temp:  36.2 C    Last Pain:  Vitals:   01/21/17 1135  TempSrc: Tympanic      Patients Stated Pain Goal: 0 (76/16/07 3710)  Complications: No apparent anesthesia complications

## 2017-01-21 NOTE — Anesthesia Post-op Follow-up Note (Cosign Needed)
Anesthesia QCDR form completed.        

## 2017-01-21 NOTE — Anesthesia Preprocedure Evaluation (Signed)
Anesthesia Evaluation  Patient identified by MRN, date of birth, ID band Patient awake    Reviewed: Allergy & Precautions, H&P , NPO status , Patient's Chart, lab work & pertinent test results, reviewed documented beta blocker date and time   Airway Mallampati: II  TM Distance: >3 FB Neck ROM: full    Dental no notable dental hx. (+) Teeth Intact   Pulmonary neg pulmonary ROS,    Pulmonary exam normal breath sounds clear to auscultation       Cardiovascular Exercise Tolerance: Good hypertension, negative cardio ROS Normal cardiovascular exam Rhythm:regular Rate:Normal     Neuro/Psych  Neuromuscular disease negative neurological ROS  negative psych ROS   GI/Hepatic negative GI ROS, Neg liver ROS,   Endo/Other  negative endocrine ROSdiabetes  Renal/GU negative Renal ROS  negative genitourinary   Musculoskeletal   Abdominal   Peds  Hematology negative hematology ROS (+)   Anesthesia Other Findings   Reproductive/Obstetrics negative OB ROS                             Anesthesia Physical  Anesthesia Plan  ASA: III  Anesthesia Plan: MAC   Post-op Pain Management:    Induction: Intravenous  PONV Risk Score and Plan:   Airway Management Planned: Nasal Cannula  Additional Equipment:   Intra-op Plan:   Post-operative Plan:   Informed Consent: I have reviewed the patients History and Physical, chart, labs and discussed the procedure including the risks, benefits and alternatives for the proposed anesthesia with the patient or authorized representative who has indicated his/her understanding and acceptance.     Plan Discussed with: CRNA  Anesthesia Plan Comments:         Anesthesia Quick Evaluation

## 2017-01-21 NOTE — Op Note (Signed)
PREOPERATIVE DIAGNOSIS:  Nuclear sclerotic cataract of the right eye.   POSTOPERATIVE DIAGNOSIS:  nuclear sclerotic cataract right eye   OPERATIVE PROCEDURE: Procedure(s): CATARACT EXTRACTION PHACO AND INTRAOCULAR LENS PLACEMENT (IOC)   SURGEON:  Birder Robson, MD.   ANESTHESIA:  Anesthesiologist: Alvin Critchley, MD CRNA: Justus Memory, CRNA  1.      Managed anesthesia care. 2.      0.4ml of Shugarcaine was instilled in the eye following the paracentesis.   COMPLICATIONS:  None.   TECHNIQUE:   Stop and chop   DESCRIPTION OF PROCEDURE:  The patient was examined and consented in the preoperative holding area where the aforementioned topical anesthesia was applied to the right eye and then brought back to the Operating Room where the right eye was prepped and draped in the usual sterile ophthalmic fashion and a lid speculum was placed. A paracentesis was created with the side port blade and the anterior chamber was filled with viscoelastic. A near clear corneal incision was performed with the steel keratome. A continuous curvilinear capsulorrhexis was performed with a cystotome followed by the capsulorrhexis forceps. Hydrodissection and hydrodelineation were carried out with BSS on a blunt cannula. The lens was removed in a stop and chop  technique and the remaining cortical material was removed with the irrigation-aspiration handpiece. The capsular bag was inflated with viscoelastic and the Technis [id number]  lens was placed in the capsular bag without complication. The remaining viscoelastic was removed from the eye with the irrigation-aspiration handpiece. The wounds were hydrated. The anterior chamber was flushed with Miostat and the eye was inflated to physiologic pressure. 0.56ml of Vigamox was placed in the anterior chamber. The wounds were found to be water tight. The eye was dressed with Vigamox. The patient was given protective glasses to wear throughout the day and a shield with which  to sleep tonight. The patient was also given drops with which to begin a drop regimen today and will follow-up with me in one day.  Implant Name Type Inv. Item Serial No. Manufacturer Lot No. LRB No. Used  LENS IOL DIOP 25.0 - [id number] Intraocular Lens LENS IOL DIOP 25.0 [id number] AMO   Right 1   Procedure(s) with comments: CATARACT EXTRACTION PHACO AND INTRAOCULAR LENS PLACEMENT (IOC) (Right) - [place] 00:39 AP% 13.0 CDE 5.16 fluid pack lot # [id number]  Electronically signed: Yuvia Plant LOUIS 01/21/2017 1:32 PM

## 2017-01-21 NOTE — Anesthesia Postprocedure Evaluation (Signed)
Anesthesia Post Note  Patient: Sandra [name]  Procedure(s) Performed: Procedure(s) (LRB): CATARACT EXTRACTION PHACO AND INTRAOCULAR LENS PLACEMENT (IOC) (Right)  Patient location during evaluation: PACU Anesthesia Type: MAC Level of consciousness: awake and alert and oriented Pain management: pain level controlled Vital Signs Assessment: post-procedure vital signs reviewed and stable Respiratory status: spontaneous breathing Cardiovascular status: blood pressure returned to baseline Anesthetic complications: no     Last Vitals:  Vitals:   01/21/17 1335 01/21/17 1339  BP: 134/80 135/76  Pulse: 72   Resp: 16   Temp: 36.7 C     Last Pain:  Vitals:   01/21/17 1135  TempSrc: Tympanic                 Shyla Gayheart

## 2017-04-15 ENCOUNTER — Other Ambulatory Visit: Payer: Self-pay | Admitting: Family Medicine

## 2017-04-15 DIAGNOSIS — I10 Essential (primary) hypertension: Secondary | ICD-10-CM

## 2017-04-15 DIAGNOSIS — Z0000 Encounter for general adult medical examination without abnormal findings: Secondary | ICD-10-CM

## 2017-04-15 DIAGNOSIS — E785 Hyperlipidemia, unspecified: Secondary | ICD-10-CM

## 2017-04-17 ENCOUNTER — Ambulatory Visit (INDEPENDENT_AMBULATORY_CARE_PROVIDER_SITE_OTHER): Payer: Medicare Other

## 2017-04-17 VITALS — BP 122/76 | HR 67 | Temp 97.9°F | Ht 61.0 in | Wt 117.8 lb

## 2017-04-17 DIAGNOSIS — Z0000 Encounter for general adult medical examination without abnormal findings: Secondary | ICD-10-CM

## 2017-04-17 DIAGNOSIS — E785 Hyperlipidemia, unspecified: Secondary | ICD-10-CM | POA: Diagnosis not present

## 2017-04-17 LAB — COMPREHENSIVE METABOLIC PANEL
ALT: 12 U/L (ref 0–35)
AST: 18 U/L (ref 0–37)
Albumin: 4 g/dL (ref 3.5–5.2)
Alkaline Phosphatase: 60 U/L (ref 39–117)
BUN: 14 mg/dL (ref 6–23)
CHLORIDE: 105 meq/L (ref 96–112)
CO2: 29 meq/L (ref 19–32)
Calcium: 9.5 mg/dL (ref 8.4–10.5)
Creatinine, Ser: 0.9 mg/dL (ref 0.40–1.20)
GFR: 79.1 mL/min (ref >60.00)
GLUCOSE: 96 mg/dL (ref 70–99)
Potassium: 3.8 mEq/L (ref 3.5–5.1)
Sodium: 140 mEq/L (ref 135–145)
Total Bilirubin: 0.7 mg/dL (ref 0.2–1.2)
Total Protein: 6.5 g/dL (ref 6.0–8.3)

## 2017-04-17 LAB — CBC WITH DIFFERENTIAL/PLATELET
BASOS ABS: 0.1 10*3/uL (ref 0.0–0.1)
BASOS PCT: 2.7 % (ref 0.0–3.0)
EOS ABS: 0.1 10*3/uL (ref 0.0–0.7)
Eosinophils Relative: 4.3 % (ref 0.0–5.0)
HCT: 39 % (ref 36.0–46.0)
Hemoglobin: 12.8 g/dL (ref 12.0–15.0)
LYMPHS ABS: 1 10*3/uL (ref 0.7–4.0)
Lymphocytes Relative: 34.4 % (ref 12.0–46.0)
MCHC: 32.9 g/dL (ref 30.0–36.0)
MCV: 90.7 fl (ref 78.0–100.0)
Monocytes Absolute: 0.2 10*3/uL (ref 0.1–1.0)
Monocytes Relative: 8.3 % (ref 3.0–12.0)
NEUTROS ABS: 1.4 10*3/uL (ref 1.4–7.7)
NEUTROS PCT: 50.3 % (ref 43.0–77.0)
PLATELETS: 273 10*3/uL (ref 150.0–400.0)
RBC: 4.31 Mil/uL (ref 3.87–5.11)
RDW: 12.6 % (ref 11.5–15.5)
WBC: 2.8 10*3/uL — ABNORMAL LOW (ref 4.0–10.5)

## 2017-04-17 LAB — LIPID PANEL
CHOL/HDL RATIO: 2
Cholesterol: 178 mg/dL (ref 0–200)
HDL: 94.5 mg/dL (ref >39.00)
LDL CALC: 76 mg/dL (ref 0–99)
NONHDL: 83.96
TRIGLYCERIDES: 38 mg/dL (ref 0.0–149.0)
VLDL: 7.6 mg/dL (ref 0.0–40.0)

## 2017-04-17 LAB — TSH: TSH: 1.68 u[IU]/mL (ref 0.35–4.50)

## 2017-04-17 NOTE — Patient Instructions (Signed)
Ms. Quashie , Thank you for taking time to come for your Medicare Wellness Visit. I appreciate your ongoing commitment to your health goals. Please review the following plan we discussed and let me know if I can assist you in the future.   These are the goals we discussed: Goals    . Increase physical activity          Starting 04/17/2017, I will continue to exercise for at least 45 min 4-5 days per week.        This is a list of the screening recommended for you and due dates:  Health Maintenance  Topic Date Due  . Flu Shot  11/02/2017*  . Tetanus Vaccine  04/18/2027*  . Mammogram  06/24/2017  . Colon Cancer Screening  06/21/2024  . DEXA scan (bone density measurement)  Completed  .  Hepatitis C: One time screening is recommended by Center for Disease Control  (CDC) for  adults born from [birth year] through 1966.   Completed  . Pneumonia vaccines  Completed  *Topic was postponed. The date shown is not the original due date.   Preventive Care for Adults  A healthy lifestyle and preventive care can promote health and wellness. Preventive health guidelines for adults include the following key practices.  . A routine yearly physical is a good way to check with your health care provider about your health and preventive screening. It is a chance to share any concerns and updates on your health and to receive a thorough exam.  . Visit your dentist for a routine exam and preventive care every 6 months. Brush your teeth twice a day and floss once a day. Good oral hygiene prevents tooth decay and gum disease.  . The frequency of eye exams is based on your age, health, family medical history, use  of contact lenses, and other factors. Follow your health care provider's ecommendations for frequency of eye exams.  . Eat a healthy diet. Foods like vegetables, fruits, whole grains, low-fat dairy products, and lean protein foods contain the nutrients you need without too many calories. Decrease your  intake of foods high in solid fats, added sugars, and salt. Eat the right amount of calories for you. Get information about a proper diet from your health care provider, if necessary.  . Regular physical exercise is one of the most important things you can do for your health. Most adults should get at least 150 minutes of moderate-intensity exercise (any activity that increases your heart rate and causes you to sweat) each week. In addition, most adults need muscle-strengthening exercises on 2 or more days a week.  Silver Sneakers may be a benefit available to you. To determine eligibility, you may visit the website: www.silversneakers.com or contact program at 951-010-2322 Mon-Fri between 8AM-8PM.   . Maintain a healthy weight. The body mass index (BMI) is a screening tool to identify possible weight problems. It provides an estimate of body fat based on height and weight. Your health care provider can find your BMI and can help you achieve or maintain a healthy weight.   For adults 20 years and older: ? A BMI below 18.5 is considered underweight. ? A BMI of 18.5 to 24.9 is normal. ? A BMI of 25 to 29.9 is considered overweight. ? A BMI of 30 and above is considered obese.   . Maintain normal blood lipids and cholesterol levels by exercising and minimizing your intake of saturated fat. Eat a balanced diet with plenty of  fruit and vegetables. Blood tests for lipids and cholesterol should begin at age 2 and be repeated every 5 years. If your lipid or cholesterol levels are high, you are over 50, or you are at high risk for heart disease, you may need your cholesterol levels checked more frequently. Ongoing high lipid and cholesterol levels should be treated with medicines if diet and exercise are not working.  . If you smoke, find out from your health care provider how to quit. If you do not use tobacco, please do not start.  . If you choose to drink alcohol, please do not consume more than 2  drinks per day. One drink is considered to be 12 ounces (355 mL) of beer, 5 ounces (148 mL) of wine, or 1.5 ounces (44 mL) of liquor.  . If you are 61-66 years old, ask your health care provider if you should take aspirin to prevent strokes.  . Use sunscreen. Apply sunscreen liberally and repeatedly throughout the day. You should seek shade when your shadow is shorter than you. Protect yourself by wearing long sleeves, pants, a wide-brimmed hat, and sunglasses year round, whenever you are outdoors.  . Once a month, do a whole body skin exam, using a mirror to look at the skin on your back. Tell your health care provider of new moles, moles that have irregular borders, moles that are larger than a pencil eraser, or moles that have changed in shape or color.

## 2017-04-21 NOTE — Progress Notes (Signed)
Subjective:   Sandra [name] is a 71 y.o. female who presents for Medicare Annual (Subsequent) preventive examination.  Review of Systems:  N/A Cardiac Risk Factors include: advanced age (>19men, >18 women);dyslipidemia;hypertension     Objective:     Vitals: BP 122/76 (BP Location: Left Arm, Patient Position: Sitting, Cuff Size: Normal)   Pulse 67   Temp 97.9 F (36.6 C) (Oral)   Ht 5\' 1"  (1.549 m) Comment: no shoes  Wt 117 lb 12 oz (53.4 kg)   LMP  (LMP Unknown)   SpO2 98%   BMI 22.25 kg/m   Body mass index is 22.25 kg/m.   Tobacco History  Smoking Status  . Never Smoker  Smokeless Tobacco  . Never Used     Counseling given: No   Past Medical History:  Diagnosis Date  . Breast cancer ([place]) 07/13/2015   RIGHT lumpectomy 07/13/15  . Hyperlipidemia   . Hypertension   . Neuropathy   . 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 Visual blurriness    SOMETHING NEW THAT HAS DEVELOPED SINCE 07-13-15 SURGERY- SEEN AT [facility] ENT AND EXAM WAS NORMAL PER PT (08-09-15)   Past Surgical History:  Procedure Laterality Date  . ABDOMINAL HYSTERECTOMY  1987  . BREAST BIOPSY Right    neg  . BREAST BIOPSY Left    4 oclock, FIBROEPITHELIAL LESION FAVOR CELLULAR FIBROADENOMA  . BREAST BIOPSY Left    2 oclock, CYSTIC APOCRINE METAPLASIA  . BREAST BIOPSY Right 07/13/15   130 INVASIVE MAMMARY CARCINOMA, NO SPECIAL TYPE  . BREAST LUMPECTOMY Left 07/13/2015  . BREAST LUMPECTOMY WITH SENTINEL LYMPH NODE BIOPSY Right 07/13/2015   Procedure: BREAST LUMPECTOMY WITH SENTINEL LYMPH NODE BX;  Surgeon: Christene Lye, MD;  Location: [facility] ORS;  Service: General;  Laterality: Right;  . BUNIONECTOMY Right 1993  . CATARACT EXTRACTION W/PHACO Left 09/17/2016   Procedure: CATARACT EXTRACTION PHACO AND INTRAOCULAR LENS PLACEMENT (IOC);  Surgeon: Birder Robson, MD;  Location: [facility] ORS;  Service: Ophthalmology;  Laterality: Left;  [place] 00:47AP% 17.6CDE 8.39Fluid  pack lot # [id number]  . CATARACT EXTRACTION W/PHACO Right 01/21/2017   Procedure: CATARACT EXTRACTION PHACO AND INTRAOCULAR LENS PLACEMENT (IOC);  Surgeon: Birder Robson, MD;  Location: [facility] ORS;  Service: Ophthalmology;  Laterality: Right;  [place] 00:39 AP% 13.0 CDE 5.16 fluid pack lot # [id number]  . fatty tumor Left 2013   side  . PORT-A-CATH REMOVAL  2017  . PORTACATH PLACEMENT Left 08/16/2015   Procedure: INSERTION PORT-A-CATH;  Surgeon: Christene Lye, MD;  Location: [facility] ORS;  Service: General;  Laterality: Left;   Family History  Problem Relation Age of Onset  . Cancer Sister 24       breast cancer  . Breast cancer Sister 57  . Colon cancer Neg Hx    History  Sexual Activity  . Sexual activity: Yes    Outpatient Encounter Prescriptions as of 04/17/2017  Medication Sig  . amLODipine (NORVASC) 5 MG tablet TAKE ONE TABLET BY MOUTH ONCE DAILY  . anastrozole (ARIMIDEX) 1 MG tablet Take 1 tablet (1 mg total) by mouth daily. (Patient taking differently: Take 1 mg by mouth daily after breakfast. )  . b complex vitamins tablet Take 1 tablet by mouth daily.  Marland Kitchen BIOTIN PO Take 1 tablet by mouth 2 (two) times daily.  . Cholecalciferol (VITAMIN D-3) 5000 units TABS Take 5,000 Units by mouth 2 (two) times daily.   . Difluprednate (DUREZOL) 0.05 % EMUL  Place 1 drop into the left eye 2 (two) times daily.  . fluticasone (FLONASE) 50 MCG/ACT nasal spray Place 2 sprays into both nostrils daily. (Patient taking differently: Place 2 sprays into both nostrils daily as needed (for allergies.). )  . HYDROcodone-acetaminophen (NORCO/VICODIN) 5-325 MG tablet Take 1 tablet by mouth every 6 (six) hours as needed for moderate pain.  Marland Kitchen lisinopril (PRINIVIL,ZESTRIL) 10 MG tablet Take 1 tablet (10 mg total) by mouth daily.  . nepafenac (ILEVRO) 0.3 % ophthalmic suspension Place 1 drop into the left eye daily.  . Potassium 99 MG TABS Take 99 mg by mouth daily.   . vitamin A 8000 UNIT capsule Take 8,000  Units by mouth daily with lunch.    No facility-administered encounter medications on file as of 04/17/2017.     Activities of Daily Living In your present state of health, do you have any difficulty performing the following activities: 04/17/2017  Hearing? Y  Vision? N  Difficulty concentrating or making decisions? N  Walking or climbing stairs? N  Dressing or bathing? N  Doing errands, shopping? N  Preparing Food and eating ? N  Using the Toilet? N  In the past six months, have you accidently leaked urine? N  Do you have problems with loss of bowel control? N  Managing your Medications? N  Managing your Finances? N  Housekeeping or managing your Housekeeping? N  Some recent data might be hidden    Patient Care Team: Lucille Passy, MD as PCP - General (Family Medicine) Glennie Isle, PA-C (Physician Assistant) Pyrtle, Lajuan Lines, MD as Consulting Physician (Gastroenterology) Lucille Passy, MD as Consulting Physician (Family Medicine) Christene Lye, MD (General Surgery) Cammie Sickle, MD as Consulting Physician (Internal Medicine) Leona Singleton, RN as Oncology Nurse Navigator Noreene Filbert, MD as Referring Physician (Radiation Oncology)    Assessment:     Hearing Screening   125Hz  250Hz  500Hz  1000Hz  2000Hz  3000Hz  4000Hz  6000Hz  8000Hz   Right ear:   40 0 40  0    Left ear:   40 40 40  0    Vision Screening Comments: Last vision exam in 2018 with Dr. George Ina   Exercise Activities and Dietary recommendations Current Exercise Habits: Home exercise routine, Type of exercise: walking;stretching, Time (Minutes): 45, Frequency (Times/Week): 5, Weekly Exercise (Minutes/Week): 225, Intensity: Moderate, Exercise limited by: None identified  Goals    . Increase physical activity          Starting 04/17/2017, I will continue to exercise for at least 45 min 4-5 days per week.       Fall Risk Fall Risk  04/17/2017 04/15/2016 08/03/2015 04/12/2015 04/06/2014  Falls in  the past year? No No No No No   Depression Screen PHQ 2/9 Scores 04/17/2017 04/15/2016 08/03/2015 04/12/2015  PHQ - 2 Score 0 0 0 0  PHQ- 9 Score 0 - - -     Cognitive Function MMSE - Mini Mental State Exam 04/17/2017  Orientation to time 5  Orientation to Place 5  Registration 3  Attention/ Calculation 0  Recall 2  Recall-comments unable to recall 1 of 3 words  Language- name 2 objects 0  Language- repeat 1  Language- follow 3 step command 3  Language- read & follow direction 0  Write a sentence 0  Copy design 0  Total score 19       PLEASE NOTE: A Mini-Cog screen was completed. Maximum score is 20. A value of 0 denotes this  part of Folstein MMSE was not completed or the patient failed this part of the Mini-Cog screening.   Mini-Cog Screening Orientation to Time - Max 5 pts Orientation to Place - Max 5 pts Registration - Max 3 pts Recall - Max 3 pts Language Repeat - Max 1 pts Language Follow 3 Step Command - Max 3 pts   Immunization History  Administered Date(s) Administered  . Influenza,inj,Quad PF,6+ Mos 04/06/2014  . Influenza-Unspecified 04/15/2016  . Pneumococcal Conjugate-13 04/06/2014  . Pneumococcal Polysaccharide-23 01/15/2012  . Zoster 01/15/2012   Screening Tests Health Maintenance  Topic Date Due  . INFLUENZA VACCINE  11/02/2017 (Originally 03/05/2017)  . TETANUS/TDAP  04/18/2027 (Originally 01/29/1964)  . MAMMOGRAM  06/24/2017  . COLONOSCOPY  06/21/2024  . DEXA SCAN  Completed  . Hepatitis C Screening  Completed  . PNA vac Low Risk Adult  Completed      Plan:     I have personally reviewed and addressed the Medicare Annual Wellness questionnaire and have noted the following in the patient's chart:  A. Medical and social history B. Use of alcohol, tobacco or illicit drugs  C. Current medications and supplements D. Functional ability and status E.  Nutritional status F.  Physical activity G. Advance directives H. List of other physicians I.    Hospitalizations, surgeries, and ER visits in previous 12 months J.  [place] to include hearing, vision, cognitive, depression L. Referrals and appointments - none  In addition, I have reviewed and discussed with patient certain preventive protocols, quality metrics, and best practice recommendations. A written personalized care plan for preventive services as well as general preventive health recommendations were provided to patient.  See attached scanned questionnaire for additional information.   Signed,   Lindell Noe, MHA, BS, LPN Health Coach

## 2017-04-21 NOTE — Progress Notes (Signed)
PCP notes:   Health maintenance:  Flu vaccine - addressed Tetanus - postponed/insurance  Abnormal screenings:   Hearing - failed  Hearing Screening   125Hz  250Hz  500Hz  1000Hz  2000Hz  3000Hz  4000Hz  6000Hz  8000Hz   Right ear:   40 0 40  0    Left ear:   40 40 40  0     Mini-Cog score: 19/20  Patient concerns:   None  Nurse concerns:  None  Next PCP appt:   04/24/17 @ 0915

## 2017-04-21 NOTE — Progress Notes (Signed)
Pre visit review using our clinic review tool, if applicable. No additional management support is needed unless otherwise documented below in the visit note. 

## 2017-04-22 NOTE — Progress Notes (Signed)
I reviewed health advisor's note, was available for consultation, and agree with documentation and plan.  

## 2017-04-24 ENCOUNTER — Ambulatory Visit (INDEPENDENT_AMBULATORY_CARE_PROVIDER_SITE_OTHER): Payer: Medicare Other | Admitting: Family Medicine

## 2017-04-24 ENCOUNTER — Encounter: Payer: Medicare Other | Admitting: Family Medicine

## 2017-04-24 VITALS — BP 116/78 | HR 78 | Temp 98.0°F | Ht 61.0 in | Wt 119.0 lb

## 2017-04-24 DIAGNOSIS — Z01419 Encounter for gynecological examination (general) (routine) without abnormal findings: Secondary | ICD-10-CM

## 2017-04-24 DIAGNOSIS — E785 Hyperlipidemia, unspecified: Secondary | ICD-10-CM | POA: Diagnosis not present

## 2017-04-24 DIAGNOSIS — H6121 Impacted cerumen, right ear: Secondary | ICD-10-CM

## 2017-04-24 DIAGNOSIS — I10 Essential (primary) hypertension: Secondary | ICD-10-CM

## 2017-04-24 DIAGNOSIS — Z1283 Encounter for screening for malignant neoplasm of skin: Secondary | ICD-10-CM

## 2017-04-24 DIAGNOSIS — Z23 Encounter for immunization: Secondary | ICD-10-CM | POA: Diagnosis not present

## 2017-04-24 DIAGNOSIS — C50211 Malignant neoplasm of upper-inner quadrant of right female breast: Secondary | ICD-10-CM | POA: Diagnosis not present

## 2017-04-24 DIAGNOSIS — Z170 Estrogen receptor positive status [ER+]: Secondary | ICD-10-CM

## 2017-04-24 NOTE — Progress Notes (Signed)
Subjective:   Patient ID: Sandra [name], female    DOB: [birth year], 71 y.o.   MRN: [id number]  Sandra [name] is a pleasant 71 y.o. year old female who presents to clinic today with Annual Exam  and follow up of chronic medical conditions on 04/24/2017  HPI:  Annual medicare wellness visit with Candis Musa, RN on 04/17/17. Note reviewed.  Health Maintenance  Topic Date Due  . INFLUENZA VACCINE  11/02/2017 (Originally 03/05/2017)  . TETANUS/TDAP  04/18/2027 (Originally 01/29/1964)  . MAMMOGRAM  06/24/2017  . COLONOSCOPY  06/21/2024  . DEXA SCAN  Completed  . Hepatitis C Screening  Completed  . PNA vac Low Risk Adult  Completed   Right ear pain and hearing loss- has tried OTC wax removal drops without improvement.  Right breast cancer- remains on Arimedex.  Doing well.   Wt Readings from Last 3 Encounters:  04/24/17 119 lb (54 kg)  04/17/17 117 lb 12 oz (53.4 kg)  01/21/17 121 lb (54.9 kg)     HTN- now on lisinopril daily and norvasc.  Denies any CP, SOB or LE edema. No blurred vision. Lab Results  Component Value Date   CREATININE 0.90 04/17/2017     HLD- well controlled off pravachol.   Lab Results  Component Value Date   CHOL 178 04/17/2017   HDL 94.50 04/17/2017   LDLCALC 76 04/17/2017   TRIG 38.0 04/17/2017   CHOLHDL 2 04/17/2017   Lab Results  Component Value Date   ALT 12 04/17/2017   AST 18 04/17/2017   ALKPHOS 60 04/17/2017   BILITOT 0.7 04/17/2017   Lab Results  Component Value Date   WBC 2.8 (L) 04/17/2017   HGB 12.8 04/17/2017   HCT 39.0 04/17/2017   MCV 90.7 04/17/2017   PLT 273.0 04/17/2017   Lab Results  Component Value Date   TSH 1.68 04/17/2017     Current Outpatient Prescriptions on File Prior to Visit  Medication Sig Dispense Refill  . amLODipine (NORVASC) 5 MG tablet TAKE ONE TABLET BY MOUTH ONCE DAILY 90 tablet 2  . anastrozole (ARIMIDEX) 1 MG tablet Take 1 tablet (1 mg total) by mouth daily.  (Patient taking differently: Take 1 mg by mouth daily after breakfast. ) 90 tablet 1  . b complex vitamins tablet Take 1 tablet by mouth daily.    Marland Kitchen BIOTIN PO Take 1 tablet by mouth 2 (two) times daily.    . Cholecalciferol (VITAMIN D-3) 5000 units TABS Take 5,000 Units by mouth 2 (two) times daily.     . Difluprednate (DUREZOL) 0.05 % EMUL Place 1 drop into the left eye 2 (two) times daily.    . fluticasone (FLONASE) 50 MCG/ACT nasal spray Place 2 sprays into both nostrils daily. (Patient taking differently: Place 2 sprays into both nostrils daily as needed (for allergies.). ) 16 g 6  . HYDROcodone-acetaminophen (NORCO/VICODIN) 5-325 MG tablet Take 1 tablet by mouth every 6 (six) hours as needed for moderate pain.    Marland Kitchen lisinopril (PRINIVIL,ZESTRIL) 10 MG tablet Take 1 tablet (10 mg total) by mouth daily. 90 tablet 3  . nepafenac (ILEVRO) 0.3 % ophthalmic suspension Place 1 drop into the left eye daily.    . Potassium 99 MG TABS Take 99 mg by mouth daily.     . vitamin A 8000 UNIT capsule Take 8,000 Units by mouth daily with lunch.      No current facility-administered medications on file prior to visit.  Allergies  Allergen Reactions  . Cymbalta [Duloxetine Hcl] Other (See Comments)    Glaucoma.  Unable to take  . Lovastatin Anaphylaxis    Tongue swelling    Past Medical History:  Diagnosis Date  . Breast cancer ([place]) 07/13/2015   RIGHT lumpectomy 07/13/15  . Hyperlipidemia   . Hypertension   . Neuropathy   . 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 Visual blurriness    SOMETHING NEW THAT HAS DEVELOPED SINCE 07-13-15 SURGERY- SEEN AT [facility] ENT AND EXAM WAS NORMAL PER PT (08-09-15)    Past Surgical History:  Procedure Laterality Date  . ABDOMINAL HYSTERECTOMY  1987  . BREAST BIOPSY Right    neg  . BREAST BIOPSY Left    4 oclock, FIBROEPITHELIAL LESION FAVOR CELLULAR FIBROADENOMA  . BREAST BIOPSY Left    2 oclock, CYSTIC APOCRINE METAPLASIA  . BREAST  BIOPSY Right 07/13/15   130 INVASIVE MAMMARY CARCINOMA, NO SPECIAL TYPE  . BREAST LUMPECTOMY Left 07/13/2015  . BREAST LUMPECTOMY WITH SENTINEL LYMPH NODE BIOPSY Right 07/13/2015   Procedure: BREAST LUMPECTOMY WITH SENTINEL LYMPH NODE BX;  Surgeon: Christene Lye, MD;  Location: [facility] ORS;  Service: General;  Laterality: Right;  . BUNIONECTOMY Right 1993  . CATARACT EXTRACTION W/PHACO Left 09/17/2016   Procedure: CATARACT EXTRACTION PHACO AND INTRAOCULAR LENS PLACEMENT (IOC);  Surgeon: Birder Robson, MD;  Location: [facility] ORS;  Service: Ophthalmology;  Laterality: Left;  [place] 00:47AP% 17.6CDE 8.39Fluid pack lot # [id number]  . CATARACT EXTRACTION W/PHACO Right 01/21/2017   Procedure: CATARACT EXTRACTION PHACO AND INTRAOCULAR LENS PLACEMENT (IOC);  Surgeon: Birder Robson, MD;  Location: [facility] ORS;  Service: Ophthalmology;  Laterality: Right;  [place] 00:39 AP% 13.0 CDE 5.16 fluid pack lot # [id number]  . fatty tumor Left 2013   side  . PORT-A-CATH REMOVAL  2017  . PORTACATH PLACEMENT Left 08/16/2015   Procedure: INSERTION PORT-A-CATH;  Surgeon: Christene Lye, MD;  Location: [facility] ORS;  Service: General;  Laterality: Left;    Family History  Problem Relation Age of Onset  . Cancer Sister 59       breast cancer  . Breast cancer Sister 45  . Colon cancer Neg Hx     Social History   Social History  . Marital status: Married    Spouse name: N/A  . Number of children: N/A  . Years of education: N/A   Occupational History  . Not on file.   Social History Main Topics  . Smoking status: Never Smoker  . Smokeless tobacco: Never Used  . Alcohol use No  . Drug use: No  . Sexual activity: Yes   Other Topics Concern  . Not on file   Social History Narrative   End of life wishes (updated 01/2013)-   Daughter of wishes (Cherlyl Duwayne Heck)      Desires CPR.   Desires life support if reasonable.            The PMH, PSH, Social History, Family History, Medications, and allergies  have been reviewed in [facility], and have been updated if relevant.   Review of Systems  Constitutional: Negative.   HENT: Positive for ear pain.   Respiratory: Negative.   Cardiovascular: Negative.   Gastrointestinal: Negative.   Endocrine: Negative.   Genitourinary: Negative.   Musculoskeletal: Negative.   Skin: Negative.  Negative for color change.  Neurological: Negative.   Hematological: Negative.   Psychiatric/Behavioral: Negative.   All other systems reviewed and are  negative.      Objective:    BP 116/78   Pulse 78   Temp 98 F (36.7 C) (Oral)   Ht 5\' 1"  (1.549 m)   Wt 119 lb (54 kg)   LMP  (LMP Unknown)   SpO2 98%   BMI 22.48 kg/m    Physical Exam  Constitutional: She is oriented to person, place, and time. She appears well-developed and well-nourished. No distress.  HENT:  Head: Normocephalic.  Right cerumen impaction  Eyes: Conjunctivae are normal.  Neck: Normal range of motion.  Cardiovascular: Normal rate.   Pulmonary/Chest: Effort normal.  Abdominal: Soft. Bowel sounds are normal. She exhibits no distension. There is no tenderness. There is no rebound.  Genitourinary: No breast swelling, tenderness, discharge or bleeding. Right adnexum displays no mass, no tenderness and no fullness. Left adnexum displays no mass, no tenderness and no fullness.  Musculoskeletal: Normal range of motion.  Neurological: She is alert and oriented to person, place, and time. No cranial nerve deficit.  Skin: Skin is warm and dry.  Psychiatric: She has a normal mood and affect. Her behavior is normal. Judgment and thought content normal.  Nursing note and vitals reviewed.         Assessment & Plan:   Hyperlipidemia, unspecified hyperlipidemia type  Well woman exam  Carcinoma of upper-inner quadrant of right breast in female, estrogen receptor positive ([place]) No Follow-up on file.

## 2017-04-24 NOTE — Assessment & Plan Note (Signed)
Ceruminosis is noted.  Wax is removed by syringing and manual debridement. Instructions for home care to prevent wax buildup are given.  

## 2017-04-24 NOTE — Assessment & Plan Note (Signed)
Reviewed preventive care protocols, scheduled due services, and updated immunizations Discussed nutrition, exercise, diet, and healthy lifestyle.  

## 2017-04-24 NOTE — Assessment & Plan Note (Signed)
Well controlled on current rxs. No changes made today. 

## 2017-04-24 NOTE — Assessment & Plan Note (Signed)
Diet controlled.  

## 2017-04-24 NOTE — Assessment & Plan Note (Signed)
Followed by oncology 

## 2017-04-30 ENCOUNTER — Other Ambulatory Visit: Payer: Self-pay

## 2017-04-30 DIAGNOSIS — C50211 Malignant neoplasm of upper-inner quadrant of right female breast: Secondary | ICD-10-CM

## 2017-04-30 DIAGNOSIS — Z170 Estrogen receptor positive status [ER+]: Principal | ICD-10-CM

## 2017-05-01 ENCOUNTER — Ambulatory Visit
Admission: RE | Admit: 2017-05-01 | Discharge: 2017-05-01 | Disposition: A | Discharge status: Home / Self Care | Payer: Medicare Other | Source: Ambulatory Visit | Attending: Radiation Oncology | Admitting: Radiation Oncology

## 2017-05-01 ENCOUNTER — Encounter: Payer: Self-pay | Admitting: Radiation Oncology

## 2017-05-01 VITALS — BP 134/86 | HR 72 | Temp 98.0°F | Resp 18 | Wt 117.2 lb

## 2017-05-01 DIAGNOSIS — Z923 Personal history of irradiation: Secondary | ICD-10-CM | POA: Diagnosis not present

## 2017-05-01 DIAGNOSIS — Z79811 Long term (current) use of aromatase inhibitors: Secondary | ICD-10-CM | POA: Insufficient documentation

## 2017-05-01 DIAGNOSIS — Z170 Estrogen receptor positive status [ER+]: Secondary | ICD-10-CM | POA: Insufficient documentation

## 2017-05-01 DIAGNOSIS — C50211 Malignant neoplasm of upper-inner quadrant of right female breast: Secondary | ICD-10-CM | POA: Insufficient documentation

## 2017-05-01 NOTE — Progress Notes (Signed)
Radiation Oncology Follow up Note  Name: Sandra [name]   Date:   05/01/2017 MRN:  [id number] DOB: 03/01/1946    This 71 y.o. 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 She is seen today in routine follow-up and is doing well. She had a mammogram back in [place] was BI-RADS 2 benign. I've asked the patient attention to an area of thickening which turned out to be surgical change. She is also currently onarimadex tolerating that without side effect. She specifically denies breast tenderness cough or bone pain.  COMPLICATIONS OF TREATMENT: none  FOLLOW UP COMPLIANCE: keeps appointments   PHYSICAL EXAM:  BP 134/86   Pulse 72   Temp 98 F (36.7 C)   Resp 18   Wt 117 lb 2.8 oz (53.1 kg)   LMP  (LMP Unknown)   BMI 22.14 kg/m  Lungs are clear to A&P cardiac examination essentially unremarkable with regular rate and rhythm. No dominant mass or nodularity is noted in either breast in 2 positions examined. Incision is well-healed. No axillary or supraclavicular adenopathy is appreciated. Cosmetic result is excellent.there is still some thickness in her lumpectomy site again which is been confirmed a surgical change. Well-developed well-nourished patient in NAD. HEENT reveals PERLA, EOMI, discs not visualized.  Oral cavity is clear. No oral mucosal lesions are identified. Neck is clear without evidence of cervical or supraclavicular adenopathy. Lungs are clear to A&P. Cardiac examination is essentially unremarkable with regular rate and rhythm without murmur rub or thrill. Abdomen is benign with no organomegaly or masses noted. Motor  sensory and DTR levels are equal and symmetric in the upper and lower extremities. Cranial nerves II through XII are grossly intact. Proprioception is intact. No peripheral adenopathy or edema is identified. No motor or sensory levels are noted. Crude visual fields are within normal range.  RADIOLOGY RESULTS: mammograms from June are reviewed and compatible with the above-stated findings.  PLAN: present time she is doing well with no evidence of disease. I'm please were overall progress. I've asked to see her back in 1 year for follow-up. Patient knows to call sooner with any concerns. She continues on arimadex without side effect. Patient is to call with any concerns.  I would like to take this opportunity to thank you for allowing me to participate in the care of your patient..    , S., MD   

## 2017-05-01 NOTE — Progress Notes (Signed)
  Oncology Nurse Navigator Documentation  Navigator Location: [facility] (05/01/17 1600)   )Navigator Encounter Type: Other;Follow-up Appt (05/01/17 1600)                     Patient Visit Type: Follow-up (05/01/17 1600)   Barriers/Navigation Needs: Coordination of Care (05/01/17 1600)   Interventions: Coordination of Care (05/01/17 1600)   Coordination of Care: Appts;Other (Acupuncture for CIPN) (05/01/17 1600)                  Patient reports she is having CIPN.  Approval sent to [place], Dr. Raul Del for 6 sessions of acupuncture as integrative therapy to be paid through [employer].

## 2017-05-09 ENCOUNTER — Inpatient Hospital Stay: Payer: Medicare Other | Attending: Internal Medicine | Admitting: Internal Medicine

## 2017-05-09 ENCOUNTER — Inpatient Hospital Stay: Payer: Medicare Other

## 2017-05-09 VITALS — BP 133/79 | HR 77 | Temp 98.1°F | Resp 16 | Wt 116.6 lb

## 2017-05-09 DIAGNOSIS — I10 Essential (primary) hypertension: Secondary | ICD-10-CM

## 2017-05-09 DIAGNOSIS — Z79811 Long term (current) use of aromatase inhibitors: Secondary | ICD-10-CM | POA: Diagnosis not present

## 2017-05-09 DIAGNOSIS — M8580 Other specified disorders of bone density and structure, unspecified site: Secondary | ICD-10-CM | POA: Diagnosis not present

## 2017-05-09 DIAGNOSIS — D72819 Decreased white blood cell count, unspecified: Secondary | ICD-10-CM | POA: Diagnosis not present

## 2017-05-09 DIAGNOSIS — T451X5S Adverse effect of antineoplastic and immunosuppressive drugs, sequela: Secondary | ICD-10-CM

## 2017-05-09 DIAGNOSIS — Z9221 Personal history of antineoplastic chemotherapy: Secondary | ICD-10-CM | POA: Diagnosis not present

## 2017-05-09 DIAGNOSIS — Z803 Family history of malignant neoplasm of breast: Secondary | ICD-10-CM | POA: Diagnosis not present

## 2017-05-09 DIAGNOSIS — Z170 Estrogen receptor positive status [ER+]: Principal | ICD-10-CM

## 2017-05-09 DIAGNOSIS — C50211 Malignant neoplasm of upper-inner quadrant of right female breast: Secondary | ICD-10-CM

## 2017-05-09 DIAGNOSIS — E785 Hyperlipidemia, unspecified: Secondary | ICD-10-CM

## 2017-05-09 DIAGNOSIS — Z79899 Other long term (current) drug therapy: Secondary | ICD-10-CM | POA: Diagnosis not present

## 2017-05-09 DIAGNOSIS — G620 Drug-induced polyneuropathy: Secondary | ICD-10-CM

## 2017-05-09 LAB — COMPREHENSIVE METABOLIC PANEL
ALBUMIN: 4.1 g/dL (ref 3.5–5.0)
ALK PHOS: 64 U/L (ref 38–126)
ALT: 17 U/L (ref 14–54)
ANION GAP: 8 (ref 5–15)
AST: 23 U/L (ref 15–41)
BUN: 13 mg/dL (ref 6–20)
CO2: 28 mmol/L (ref 22–32)
Calcium: 9.3 mg/dL (ref 8.9–10.3)
Chloride: 102 mmol/L (ref 101–111)
Creatinine, Ser: 0.76 mg/dL (ref 0.44–1.00)
GFR calc Af Amer: >60 mL/min (ref >60)
GFR calc non Af Amer: >60 mL/min (ref >60)
GLUCOSE: 100 mg/dL — AB (ref 65–99)
Potassium: 3.7 mmol/L (ref 3.5–5.1)
SODIUM: 138 mmol/L (ref 135–145)
Total Bilirubin: 0.9 mg/dL (ref 0.3–1.2)
Total Protein: 7 g/dL (ref 6.5–8.1)

## 2017-05-09 LAB — CBC WITH DIFFERENTIAL/PLATELET
BASOS PCT: 2 %
Basophils Absolute: 0.1 10*3/uL (ref 0–0.1)
EOS ABS: 0.1 10*3/uL (ref 0–0.7)
Eosinophils Relative: 3 %
HCT: 37.9 % (ref 35.0–47.0)
HEMOGLOBIN: 13 g/dL (ref 12.0–16.0)
Lymphocytes Relative: 33 %
Lymphs Abs: 0.9 10*3/uL — ABNORMAL LOW (ref 1.0–3.6)
MCH: 30.3 pg (ref 26.0–34.0)
MCHC: 34.2 g/dL (ref 32.0–36.0)
MCV: 88.5 fL (ref 80.0–100.0)
Monocytes Absolute: 0.2 10*3/uL (ref 0.2–0.9)
Monocytes Relative: 9 %
Neutro Abs: 1.5 10*3/uL (ref 1.4–6.5)
Neutrophils Relative %: 53 %
Platelets: 286 10*3/uL (ref 150–440)
RBC: 4.28 MIL/uL (ref 3.80–5.20)
RDW: 13 % (ref 11.5–14.5)
WBC: 2.8 10*3/uL — AB (ref 3.6–11.0)

## 2017-05-09 NOTE — Assessment & Plan Note (Signed)
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 Currently finished adjuvant chemotherapy. No clinical evidence of recurrence.  S/p RT. On Anastrazole; Tolerating well.  Mammogram again in Nov 2018.   # Osteopenia- BMD Nov 2017- [discussed Prolia] Recommend exercise program/ weights; on ca+vit D.  # mild leucopenia- monitor for now.   # Grade 2 neuropathy- STABLE. Awaiting evaluation with acupuncture.   # follow up in 6 months/labs.

## 2017-05-09 NOTE — Progress Notes (Signed)
[place] @ [facility] [phone]  [phone]   INITIAL CONSULT  Sandra [name] OB: May 23, 1946  MR#: [id number]  [id number]#:[id number]  Patient Care Team: Lucille Passy, MD as PCP - General (Family Medicine) Glennie Isle, PA-C (Physician Assistant) Pyrtle, Lajuan Lines, MD as Consulting Physician (Gastroenterology) Lucille Passy, MD as Consulting Physician (Family Medicine) Christene Lye, MD (General Surgery) Cammie Sickle, MD as Consulting Physician (Internal Medicine) Leona Singleton, RN as Oncology Nurse Navigator Noreene Filbert, MD as Referring Physician (Radiation Oncology)  CHIEF COMPLAINT:  No chief complaint on file.  VISIT DIAGNOSIS:     ICD-10-CM   1. Carcinoma of upper-inner quadrant of right breast in female, estrogen receptor positive ([place]) C50.211 CBC with Differential   P91.5 Basic metabolic panel     Oncology History    carcinoma breast (right) inner and upper quadrant T1 cN0 M0 tumor Estrogen receptor positive progesterone receptor positive HER-2 receptor negative by fish Mammo print shows high risk (diagnosis in January of 2016) patient has 22% average risk in 5 years and 29% average risk in 10 years for distant metastectomy disease without chemotherapy on anti-hormonal treatment 2.  Patient started Cytoxan and Adriamycin on now August 30, 2015 MUGA scan shows ejection fraction of baseline 55%.  3.Patient has finished total of 4 cycles of chemotherapy on November 01, 2015 4.  Started on the Taxol from November 22, 2015; FINISHED RT [sep 2017]  # OCT 2017- START ARIMIDEX   # NOV 2017- BMD- OSTEOPENIA- ca+vit D     Carcinoma of upper-inner quadrant of right breast in female, estrogen receptor positive ([place])     INTERVAL HISTORY: 71 year old lady History of stage I right-sided breast cancer ER/PR positive currently on Arimidex is here for follow-up.  Patient has mild tingling and numbness in her extremities. Otherwise  is not getting any worse. No nausea no vomiting. No headaches. No chest pain or shortness of breath or cough.  Patient denies any fevers or chills. Denies any nausea vomiting or constipation.   REVIEW OF SYSTEMS:  A complete 10 point review of system is done which is negative for mentioned above in history of present illness.  PAST MEDICAL HISTORY: Past Medical History:  Diagnosis Date  . Breast cancer ([place]) 07/13/2015   RIGHT lumpectomy 07/13/15  . Hyperlipidemia   . Hypertension   . Neuropathy   . 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 Visual blurriness    SOMETHING NEW THAT HAS DEVELOPED SINCE 07-13-15 SURGERY- SEEN AT [facility] AND EXAM WAS NORMAL PER PT (08-09-15)    PAST SURGICAL HISTORY: Past Surgical History:  Procedure Laterality Date  . ABDOMINAL HYSTERECTOMY  1987  . BREAST BIOPSY Right    neg  . BREAST BIOPSY Left    4 oclock, FIBROEPITHELIAL LESION FAVOR CELLULAR FIBROADENOMA  . BREAST BIOPSY Left    2 oclock, CYSTIC APOCRINE METAPLASIA  . BREAST BIOPSY Right 07/13/15   130 INVASIVE MAMMARY CARCINOMA, NO SPECIAL TYPE  . BREAST LUMPECTOMY Left 07/13/2015  . BREAST LUMPECTOMY WITH SENTINEL LYMPH NODE BIOPSY Right 07/13/2015   Procedure: BREAST LUMPECTOMY WITH SENTINEL LYMPH NODE BX;  Surgeon: Christene Lye, MD;  Location: [facility] ORS;  Service: General;  Laterality: Right;  . BUNIONECTOMY Right 1993  . CATARACT EXTRACTION W/PHACO Left 09/17/2016   Procedure: CATARACT EXTRACTION PHACO AND INTRAOCULAR LENS PLACEMENT (IOC);  Surgeon: Birder Robson, MD;  Location: [facility] ORS;  Service: Ophthalmology;  Laterality: Left;  [place] 00:47AP% 17.6CDE 8.39Fluid pack lot # [id number]  . CATARACT EXTRACTION W/PHACO Right 01/21/2017   Procedure: CATARACT EXTRACTION PHACO AND INTRAOCULAR LENS PLACEMENT (IOC);  Surgeon: Birder Robson, MD;  Location: [facility] ORS;  Service: Ophthalmology;  Laterality: Right;  [place] 00:39 AP% 13.0 CDE 5.16 fluid pack lot # [id number]  .  fatty tumor Left 2013   side  . PORT-A-CATH REMOVAL  2017  . PORTACATH PLACEMENT Left 08/16/2015   Procedure: INSERTION PORT-A-CATH;  Surgeon: Christene Lye, MD;  Location: [facility] ORS;  Service: General;  Laterality: Left;    FAMILY HISTORY Family History  Problem Relation Age of Onset  . Cancer Sister 70       breast cancer  . Breast cancer Sister 71  . Colon cancer Neg Hx         ADVANCED DIRECTIVES:   Patient does not have any living will or healthcare power of attorney.  Information was given .  Available resources had been discussed.  We will follow-up on subsequent appointments regarding this issue HEALTH MAINTENANCE: Social History  Substance Use Topics  . Smoking status: Never Smoker  . Smokeless tobacco: Never Used  . Alcohol use No      :  Allergies  Allergen Reactions  . Cymbalta [Duloxetine Hcl] Other (See Comments)    Glaucoma.  Unable to take  . Lovastatin Anaphylaxis    Tongue swelling    Current Outpatient Prescriptions  Medication Sig Dispense Refill  . amLODipine (NORVASC) 5 MG tablet TAKE ONE TABLET BY MOUTH ONCE DAILY 90 tablet 2  . anastrozole (ARIMIDEX) 1 MG tablet Take 1 tablet (1 mg total) by mouth daily. (Patient taking differently: Take 1 mg by mouth daily after breakfast. ) 90 tablet 1  . b complex vitamins tablet Take 1 tablet by mouth daily.    Marland Kitchen BIOTIN PO Take 1 tablet by mouth 2 (two) times daily.    . Cholecalciferol (VITAMIN D-3) 5000 units TABS Take 5,000 Units by mouth 2 (two) times daily.     . Difluprednate (DUREZOL) 0.05 % EMUL Place 1 drop into the left eye 2 (two) times daily.    . fluticasone (FLONASE) 50 MCG/ACT nasal spray Place 2 sprays into both nostrils daily. 16 g 6  . HYDROcodone-acetaminophen (NORCO/VICODIN) 5-325 MG tablet Take 1 tablet by mouth every 6 (six) hours as needed for moderate pain.    Marland Kitchen lisinopril (PRINIVIL,ZESTRIL) 10 MG tablet Take 1 tablet (10 mg total) by mouth daily. 90 tablet 3  . nepafenac  (ILEVRO) 0.3 % ophthalmic suspension Place 1 drop into the left eye daily.    . Potassium 99 MG TABS Take 99 mg by mouth daily.     . vitamin A 8000 UNIT capsule Take 8,000 Units by mouth daily with lunch.      No current facility-administered medications for this visit.     OBJECTIVE: PHYSICAL EXAM: GENERAL:  Well developed, well nourished, sitting comfortably in the exam room in no acute distress. Accompanied by her husband.  MENTAL STATUS:  Alert and oriented to person, place and time.  ENT:  Oropharynx clear without lesion.  Tongue normal. Mucous membranes moist. Alopecia.  Examination of the nose shows redness.  No active bleeding. RESPIRATORY:  Clear to auscultation without rales, wheezes or rhonchi. Patient has a port placed on the left upper chest wall area.  No evidence of redness. CARDIOVASCULAR:  Regular rate and rhythm without murmur, rub or gallop.  ABDOMEN:  Soft, non-tender, with active bowel sounds,  and no hepatosplenomegaly.  No masses. BACK:  No CVA tenderness.  No tenderness on percussion of the back or rib cage. SKIN:  No rashes, ulcers or lesions. EXTREMITIES: No edema, no skin discoloration or tenderness.  No palpable cords. LYMPH NODES: No palpable cervical, supraclavicular, axillary or inguinal adenopathy  NEUROLOGICAL: Unremarkable. PSYCH:  Appropriate.   Vitals:   05/09/17 1345  BP: 133/79  Pulse: 77  Resp: 16  Temp: 98.1 F (36.7 C)     Body mass index is 22.03 kg/m.    ECOG FS:0 - Asymptomatic  LAB RESULTS:  Appointment on 05/09/2017  Component Date Value Ref Range Status  . WBC 05/09/2017 2.8* 3.6 - 11.0 K/uL Final  . RBC 05/09/2017 4.28  3.80 - 5.20 MIL/uL Final  . Hemoglobin 05/09/2017 13.0  12.0 - 16.0 g/dL Final  . HCT 60/75/9789 37.9  35.0 - 47.0 % Final  . MCV 05/09/2017 88.5  80.0 - 100.0 fL Final  . MCH 05/09/2017 30.3  26.0 - 34.0 pg Final  . MCHC 05/09/2017 34.2  32.0 - 36.0 g/dL Final  . RDW 64/52/7399 13.0  11.5 - 14.5 % Final    . Platelets 05/09/2017 286  150 - 440 K/uL Final  . Neutrophils Relative % 05/09/2017 53  % Final  . Neutro Abs 05/09/2017 1.5  1.4 - 6.5 K/uL Final  . Lymphocytes Relative 05/09/2017 33  % Final  . Lymphs Abs 05/09/2017 0.9* 1.0 - 3.6 K/uL Final  . Monocytes Relative 05/09/2017 9  % Final  . Monocytes Absolute 05/09/2017 0.2  0.2 - 0.9 K/uL Final  . Eosinophils Relative 05/09/2017 3  % Final  . Eosinophils Absolute 05/09/2017 0.1  0 - 0.7 K/uL Final  . Basophils Relative 05/09/2017 2  % Final  . Basophils Absolute 05/09/2017 0.1  0 - 0.1 K/uL Final  . Sodium 05/09/2017 138  135 - 145 mmol/L Final  . Potassium 05/09/2017 3.7  3.5 - 5.1 mmol/L Final  . Chloride 05/09/2017 102  101 - 111 mmol/L Final  . CO2 05/09/2017 28  22 - 32 mmol/L Final  . Glucose, Bld 05/09/2017 100* 65 - 99 mg/dL Final  . BUN 59/16/1988 13  6 - 20 mg/dL Final  . Creatinine, Ser 05/09/2017 0.76  0.44 - 1.00 mg/dL Final  . Calcium 22/03/8684 9.3  8.9 - 10.3 mg/dL Final  . Total Protein 05/09/2017 7.0  6.5 - 8.1 g/dL Final  . Albumin 52/50/6049 4.1  3.5 - 5.0 g/dL Final  . AST 33/19/9190 23  15 - 41 U/L Final  . ALT 05/09/2017 17  14 - 54 U/L Final  . Alkaline Phosphatase 05/09/2017 64  38 - 126 U/L Final  . Total Bilirubin 05/09/2017 0.9  0.3 - 1.2 mg/dL Final  . GFR calc non Af Amer 05/09/2017 >60  >60 mL/min Final  . GFR calc Af Amer 05/09/2017 >60  >60 mL/min Final   Comment: (NOTE) The eGFR has been calculated using the CKD EPI equation. This calculation has not been validated in all clinical situations. eGFR's persistently <60 mL/min signify possible Chronic Kidney Disease.   . Anion gap 05/09/2017 8  5 - 15 Final     STUDIES: No results found.  ASSESSMENT:   Carcinoma of upper-inner quadrant of right breast in female, estrogen receptor positive ([facility]) Stage I ER/PR positive HER-2/neu negative. Currently finished adjuvant chemotherapy. No clinical evidence of recurrence.  S/p RT. On Anastrazole;  Tolerating well.  Mammogram again in Nov 2018.   #  Osteopenia- BMD Nov 2017- [discussed Prolia] Recommend exercise program/ weights; on ca+vit D.  # mild leucopenia- monitor for now.   # Grade 2 neuropathy- STABLE. Awaiting evaluation with acupuncture.   # follow up in 6 months/labs.   No matching staging information was found for the patient.  Cammie Sickle, MD   05/09/2017 4:47 PM

## 2017-05-20 ENCOUNTER — Ambulatory Visit (INDEPENDENT_AMBULATORY_CARE_PROVIDER_SITE_OTHER): Payer: Medicare Other | Admitting: Internal Medicine

## 2017-05-20 ENCOUNTER — Encounter: Payer: Self-pay | Admitting: Internal Medicine

## 2017-05-20 VITALS — BP 126/78 | HR 76 | Temp 98.0°F | Wt 116.0 lb

## 2017-05-20 DIAGNOSIS — I10 Essential (primary) hypertension: Secondary | ICD-10-CM | POA: Diagnosis not present

## 2017-05-20 DIAGNOSIS — C50211 Malignant neoplasm of upper-inner quadrant of right female breast: Secondary | ICD-10-CM | POA: Diagnosis not present

## 2017-05-20 DIAGNOSIS — G620 Drug-induced polyneuropathy: Secondary | ICD-10-CM | POA: Diagnosis not present

## 2017-05-20 DIAGNOSIS — Z170 Estrogen receptor positive status [ER+]: Secondary | ICD-10-CM | POA: Diagnosis not present

## 2017-05-20 DIAGNOSIS — R252 Cramp and spasm: Secondary | ICD-10-CM

## 2017-05-20 DIAGNOSIS — T451X5A Adverse effect of antineoplastic and immunosuppressive drugs, initial encounter: Secondary | ICD-10-CM

## 2017-05-20 DIAGNOSIS — E785 Hyperlipidemia, unspecified: Secondary | ICD-10-CM

## 2017-05-20 NOTE — Assessment & Plan Note (Signed)
LDL at goal off meds Encouraged her to consume a low fat diet Will monitor

## 2017-05-20 NOTE — Progress Notes (Signed)
HPI  Pt presents to the clinic today to establish care and for management of the conditions listed below. She is transferring care from Dr. Deborra Medina.  History of Breast Cancer: Right breast. S/p lumpectomy, chemo and radiation. She is in remission. She is taking Arimadex daily. She follows with Dr. Rogue Bussing.  Chemotherapy Induced Neuropathy: She reports she has an appt with an acupuncturist this week to see if that can provide her any relief. She has tried seeing a Restaurant manager, fast food for the same without much relief.  HLD: Her last LDL was 76, 04/2017. She is not taking any cholesterol lowering medication at this  time. She consumes a low fat diet.  HTN: Her BP today is 126/78. She is taking Amlodipine and Lisinopril as prescribed. ECG from reviewed.  Flu: 04/2017 Tetanus: unsure Pneumovax: 01/2012 Prevnar: 04/2014 Shingles Vaccine: 01/2012 Mammogram: 01/2017 Pap Smear: 04/2015 Bone Density: 06/2017 Colon Screening: 06/2014 Vision Screening: biannually Dentist: biannually  Past Medical History:  Diagnosis Date  . Breast cancer ([place]) 07/13/2015   RIGHT lumpectomy 07/13/15  . Hyperlipidemia   . Hypertension   . Neuropathy   . 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 Visual blurriness    SOMETHING NEW THAT HAS DEVELOPED SINCE 07-13-15 SURGERY- SEEN AT [facility] AND EXAM WAS NORMAL PER PT (08-09-15)    Current Outpatient Prescriptions  Medication Sig Dispense Refill  . amLODipine (NORVASC) 5 MG tablet TAKE ONE TABLET BY MOUTH ONCE DAILY 90 tablet 2  . anastrozole (ARIMIDEX) 1 MG tablet Take 1 tablet (1 mg total) by mouth daily. (Patient taking differently: Take 1 mg by mouth daily after breakfast. ) 90 tablet 1  . b complex vitamins tablet Take 1 tablet by mouth daily.    Marland Kitchen BIOTIN PO Take 1 tablet by mouth 2 (two) times daily.    . Cholecalciferol (VITAMIN D-3) 5000 units TABS Take 5,000 Units by mouth 2 (two) times daily.     . Difluprednate (DUREZOL) 0.05 % EMUL Place 1  drop into the left eye 2 (two) times daily.    . fluticasone (FLONASE) 50 MCG/ACT nasal spray Place 2 sprays into both nostrils daily. 16 g 6  . HYDROcodone-acetaminophen (NORCO/VICODIN) 5-325 MG tablet Take 1 tablet by mouth every 6 (six) hours as needed for moderate pain.    Marland Kitchen lisinopril (PRINIVIL,ZESTRIL) 10 MG tablet Take 1 tablet (10 mg total) by mouth daily. 90 tablet 3  . nepafenac (ILEVRO) 0.3 % ophthalmic suspension Place 1 drop into the left eye daily.    . Potassium 99 MG TABS Take 99 mg by mouth daily.     . vitamin A 8000 UNIT capsule Take 8,000 Units by mouth daily with lunch.      No current facility-administered medications for this visit.     Allergies  Allergen Reactions  . Cymbalta [Duloxetine Hcl] Other (See Comments)    Glaucoma.  Unable to take  . Lovastatin Anaphylaxis    Tongue swelling    Family History  Problem Relation Age of Onset  . Cancer Sister 67       breast cancer  . Breast cancer Sister 56  . Colon cancer Neg Hx     Social History   Social History  . Marital status: Married    Spouse name: N/A  . Number of children: N/A  . Years of education: N/A   Occupational History  . Not on file.   Social History Main Topics  . Smoking status: Never Smoker  .  Smokeless tobacco: Never Used  . Alcohol use No  . Drug use: No  . Sexual activity: Yes   Other Topics Concern  . Not on file   Social History Narrative   End of life wishes (updated 01/2013)-   Daughter of wishes (Cherlyl Duwayne Heck)      Desires CPR.   Desires life support if reasonable.             ROS:  Constitutional: Denies fever, malaise, fatigue, headache or abrupt weight changes.  HEENT: Denies eye pain, eye redness, ear pain, ringing in the ears, wax buildup, runny nose, nasal congestion, bloody nose, or sore throat. Respiratory: Denies difficulty breathing, shortness of breath, cough or sputum production.   Cardiovascular: Denies chest pain, chest tightness,  palpitations or swelling in the hands or feet.  Gastrointestinal: Denies abdominal pain, bloating, constipation, diarrhea or blood in the stool.  GU: Denies frequency, urgency, pain with urination, blood in urine, odor or discharge. Musculoskeletal: Pt reports muscle cramps. Denies decrease in range of motion, difficulty with gait, or joint pain and swelling.  Skin: Denies redness, rashes, lesions or ulcercations.  Neurological: Denies dizziness, difficulty with memory, difficulty with speech or problems with balance and coordination.  Psych: Denies anxiety, depression, SI/HI.  No other specific complaints in a complete review of systems (except as listed in HPI above).  PE: BP 126/78   Pulse 76   Temp 98 F (36.7 C) (Oral)   Wt 116 lb (52.6 kg)   LMP  (LMP Unknown)   SpO2 99%   BMI 21.92 kg/m   Wt Readings from Last 3 Encounters:  05/09/17 116 lb 9.6 oz (52.9 kg)  05/01/17 117 lb 2.8 oz (53.1 kg)  04/24/17 119 lb (54 kg)    General: Appears her stated age, in NAD. Cardiovascular: Normal rate and rhythm. S1,S2 noted.  No murmur, rubs or gallops noted.  Pulmonary/Chest: Normal effort and positive vesicular breath sounds. No respiratory distress. No wheezes, rales or ronchi noted.  Musculoskeletal: Strength 5/5 BUE/BLE. No difficulty with gait.  Neurological: Alert and oriented.  Psychiatric: Mood and affect normal. Behavior is normal. Judgment and thought content normal.    BMET    Component Value Date/Time   NA 138 05/09/2017 1328   K 3.7 05/09/2017 1328   K 4.0 02/24/2012 0743   CL 102 05/09/2017 1328   CO2 28 05/09/2017 1328   GLUCOSE 100 (H) 05/09/2017 1328   BUN 13 05/09/2017 1328   CREATININE 0.76 05/09/2017 1328   CALCIUM 9.3 05/09/2017 1328   GFRNONAA >60 05/09/2017 1328   GFRAA >60 05/09/2017 1328    Lipid Panel     Component Value Date/Time   CHOL 178 04/17/2017 1018   TRIG 38.0 04/17/2017 1018   HDL 94.50 04/17/2017 1018   CHOLHDL 2 04/17/2017 1018    VLDL 7.6 04/17/2017 1018   LDLCALC 76 04/17/2017 1018    CBC    Component Value Date/Time   WBC 2.8 (L) 05/09/2017 1328   RBC 4.28 05/09/2017 1328   HGB 13.0 05/09/2017 1328   HGB 13.4 11/08/2014 1059   HCT 37.9 05/09/2017 1328   HCT 39.8 11/08/2014 1059   PLT 286 05/09/2017 1328   PLT 318 11/08/2014 1059   MCV 88.5 05/09/2017 1328   MCV 88 11/08/2014 1059   MCH 30.3 05/09/2017 1328   MCHC 34.2 05/09/2017 1328   RDW 13.0 05/09/2017 1328   RDW 12.7 11/08/2014 1059   LYMPHSABS 0.9 (L) 05/09/2017 1328  LYMPHSABS 1.3 11/08/2014 1059   MONOABS 0.2 05/09/2017 1328   MONOABS 0.3 11/08/2014 1059   EOSABS 0.1 05/09/2017 1328   EOSABS 0.2 11/08/2014 1059   BASOSABS 0.1 05/09/2017 1328   BASOSABS 0.0 11/08/2014 1059    Hgb A1C Lab Results  Component Value Date   HGBA1C 5.1 07/26/2015     Assessment and Plan:  Muscle Cramps:  She is well hydrated Potassium is normal ? RLS She wants to discuss this with her acupuncturist before she considers medication therapy  RTC in 1 year for your Medicare Wellness Exam

## 2017-05-20 NOTE — Assessment & Plan Note (Signed)
Controlled on Lisinopril and Amlodipine Will monitor

## 2017-05-20 NOTE — Patient Instructions (Signed)
Leg Cramps Leg cramps occur when a muscle or muscles tighten and you have no control over this tightening (involuntary muscle contraction). Muscle cramps can develop in any muscle, but the most common place is in the calf muscles of the leg. Those cramps can occur during exercise or when you are at rest. Leg cramps are painful, and they may last for a few seconds to a few minutes. Cramps may return several times before they finally stop. Usually, leg cramps are not caused by a serious medical problem. In many cases, the cause is not known. Some common causes include:  Overexertion.  Overuse from repetitive motions, or doing the same thing over and over.  Remaining in a certain position for a long period of time.  Improper preparation, form, or technique while performing a sport or an activity.  Dehydration.  Injury.  Side effects of some medicines.  Abnormally low levels of the salts and ions in your blood (electrolytes), especially potassium and calcium. These levels could be low if you are taking water pills (diuretics) or if you are pregnant.  Follow these instructions at home: Watch your condition for any changes. Taking the following actions may help to lessen any discomfort that you are feeling:  Stay well-hydrated. Drink enough fluid to keep your urine clear or pale yellow.  Try massaging, stretching, and relaxing the affected muscle. Do this for several minutes at a time.  For tight or tense muscles, use a warm towel, heating pad, or hot shower water directed to the affected area.  If you are sore or have pain after a cramp, applying ice to the affected area may relieve discomfort. ? Put ice in a plastic bag. ? Place a towel between your skin and the bag. ? Leave the ice on for 20 minutes, 2-3 times per day.  Avoid strenuous exercise for several days if you have been having frequent leg cramps.  Make sure that your diet includes the essential minerals for your muscles to  work normally.  Take medicines only as directed by your health care provider.  Contact a health care provider if:  Your leg cramps get more severe or more frequent, or they do not improve over time.  Your foot becomes cold, numb, or blue. This information is not intended to replace advice given to you by your health care provider. Make sure you discuss any questions you have with your health care provider. Document Released: 08/29/2004 Document Revised: 12/28/2015 Document Reviewed: 06/29/2014 Elsevier Interactive Patient Education  2018 [employer]vier Inc.  

## 2017-05-20 NOTE — Assessment & Plan Note (Signed)
In remission She will continue Arimidex She will continue to follow with oncology

## 2017-05-20 NOTE — Assessment & Plan Note (Signed)
Offered RX for Neurontin 100 mg TID prn, she declines She wants to see an Systems developer.

## 2017-06-25 ENCOUNTER — Ambulatory Visit: Payer: Medicare Other

## 2017-06-30 ENCOUNTER — Other Ambulatory Visit: Payer: Medicare Other

## 2017-07-01 ENCOUNTER — Ambulatory Visit
Admission: RE | Admit: 2017-07-01 | Discharge: 2017-07-01 | Disposition: A | Discharge status: Home / Self Care | Payer: Medicare Other | Source: Ambulatory Visit | Attending: General Surgery | Admitting: General Surgery

## 2017-07-01 DIAGNOSIS — Z853 Personal history of malignant neoplasm of breast: Secondary | ICD-10-CM | POA: Insufficient documentation

## 2017-07-01 DIAGNOSIS — C50211 Malignant neoplasm of upper-inner quadrant of right female breast: Secondary | ICD-10-CM

## 2017-07-01 DIAGNOSIS — Z170 Estrogen receptor positive status [ER+]: Secondary | ICD-10-CM

## 2017-07-01 DIAGNOSIS — Z9889 Other specified postprocedural states: Secondary | ICD-10-CM | POA: Insufficient documentation

## 2017-07-01 DIAGNOSIS — Z08 Encounter for follow-up examination after completed treatment for malignant neoplasm: Secondary | ICD-10-CM | POA: Insufficient documentation

## 2017-07-01 HISTORY — DX: Personal history of irradiation: Z92.3

## 2017-07-01 HISTORY — DX: Personal history of antineoplastic chemotherapy: Z92.21

## 2017-07-04 ENCOUNTER — Other Ambulatory Visit: Payer: Self-pay | Admitting: Internal Medicine

## 2017-07-04 DIAGNOSIS — Z95828 Presence of other vascular implants and grafts: Secondary | ICD-10-CM

## 2017-07-04 DIAGNOSIS — C50211 Malignant neoplasm of upper-inner quadrant of right female breast: Secondary | ICD-10-CM

## 2017-07-04 DIAGNOSIS — Z170 Estrogen receptor positive status [ER+]: Principal | ICD-10-CM

## 2017-07-09 ENCOUNTER — Encounter: Payer: Self-pay | Admitting: General Surgery

## 2017-07-09 ENCOUNTER — Ambulatory Visit: Payer: Medicare Other | Admitting: General Surgery

## 2017-07-09 VITALS — BP 128/74 | HR 84 | Resp 12 | Ht 62.5 in | Wt 120.0 lb

## 2017-07-09 DIAGNOSIS — Z170 Estrogen receptor positive status [ER+]: Secondary | ICD-10-CM | POA: Diagnosis not present

## 2017-07-09 DIAGNOSIS — C50911 Malignant neoplasm of unspecified site of right female breast: Secondary | ICD-10-CM | POA: Diagnosis not present

## 2017-07-09 NOTE — Patient Instructions (Addendum)
The patient is aware to call back for any questions or concerns.    Patient will be asked to return to the office in one year with a bilateral diagnostic mammogram with Dr Bary Castilla.

## 2017-07-09 NOTE — Progress Notes (Signed)
Patient ID: Sandra [name], female   DOB: 1946/06/05, 71 y.o.   MRN: [id number]  Chief Complaint  Patient presents with  . Follow-up    HPI Sandra [name] is a 71 y.o. female   who presents for her follow up breast cancer and a breast evaluation. The most recent mammogram was done on 11-127-18 .  Patient does perform regular self breast checks and gets regular mammograms done.    No new breast issues. Tolerating Arimidex well    HPI  Past Medical History:  Diagnosis Date  . Breast cancer ([place]) 07/13/2015   RIGHT lumpectomy 07/13/15, pT1c, pN0,M0 Stage 1 Er/PR pos, her 2 neg. MammaPrint High  . Hyperlipidemia   . Hypertension   . Neuropathy   . 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 Personal history of chemotherapy 2016  . Personal history of radiation therapy 2016  . Visual blurriness    SOMETHING NEW THAT HAS DEVELOPED SINCE 07-13-15 SURGERY- SEEN AT [facility] ENT AND EXAM WAS NORMAL PER PT (08-09-15)    Past Surgical History:  Procedure Laterality Date  . ABDOMINAL HYSTERECTOMY  1987  . BREAST BIOPSY Right    neg  . BREAST BIOPSY Left    4 oclock, FIBROEPITHELIAL LESION FAVOR CELLULAR FIBROADENOMA  . BREAST BIOPSY Left    2 oclock, CYSTIC APOCRINE METAPLASIA  . BREAST BIOPSY Right 07/13/15   130 INVASIVE MAMMARY CARCINOMA, NO SPECIAL TYPE  . BREAST LUMPECTOMY Left 07/13/2015  . BREAST LUMPECTOMY WITH SENTINEL LYMPH NODE BIOPSY Right 07/13/2015   Procedure: BREAST LUMPECTOMY WITH SENTINEL LYMPH NODE BX;  Surgeon: Christene Lye, MD;  Location: [facility] ORS;  Service: General;  Laterality: Right;  . BUNIONECTOMY Right 1993  . CATARACT EXTRACTION W/PHACO Left 09/17/2016   Procedure: CATARACT EXTRACTION PHACO AND INTRAOCULAR LENS PLACEMENT (IOC);  Surgeon: Birder Robson, MD;  Location: [facility] ORS;  Service: Ophthalmology;  Laterality: Left;  [place] 00:47AP% 17.6CDE 8.39Fluid pack lot # [id number]  . CATARACT EXTRACTION W/PHACO Right  01/21/2017   Procedure: CATARACT EXTRACTION PHACO AND INTRAOCULAR LENS PLACEMENT (IOC);  Surgeon: Birder Robson, MD;  Location: [facility] ORS;  Service: Ophthalmology;  Laterality: Right;  [place] 00:39 AP% 13.0 CDE 5.16 fluid pack lot # [id number]  . fatty tumor Left 2013   side  . PORT-A-CATH REMOVAL  2017  . PORTACATH PLACEMENT Left 08/16/2015   Procedure: INSERTION PORT-A-CATH;  Surgeon: Christene Lye, MD;  Location: [facility] ORS;  Service: General;  Laterality: Left;    Family History  Problem Relation Age of Onset  . Cancer Sister 41       breast cancer  . Breast cancer Sister 33  . Colon cancer Neg Hx     Social History Social History   Tobacco Use  . Smoking status: Never Smoker  . Smokeless tobacco: Never Used  Substance Use Topics  . Alcohol use: No    Alcohol/week: 0.0 oz  . Drug use: No    Allergies  Allergen Reactions  . Cymbalta [Duloxetine Hcl] Other (See Comments)    Glaucoma.  Unable to take  . Lovastatin Anaphylaxis    Tongue swelling    Current Outpatient Medications  Medication Sig Dispense Refill  . amLODipine (NORVASC) 5 MG tablet TAKE ONE TABLET BY MOUTH ONCE DAILY 90 tablet 2  . anastrozole (ARIMIDEX) 1 MG tablet TAKE 1 TABLET BY MOUTH ONCE DAILY 90 tablet 1  . b complex vitamins tablet Take 1 tablet by mouth daily.    Marland Kitchen  BIOTIN PO Take 1 tablet by mouth 2 (two) times daily.    . Calcium Carbonate-Vitamin D (CALCIUM 500 + D PO) Take 2 tablets by mouth daily.    . Difluprednate (DUREZOL) 0.05 % EMUL Place 1 drop into the left eye 2 (two) times daily.    . fluticasone (FLONASE) 50 MCG/ACT nasal spray Place 2 sprays into both nostrils daily. 16 g 6  . lisinopril (PRINIVIL,ZESTRIL) 10 MG tablet Take 1 tablet (10 mg total) by mouth daily. 90 tablet 3  . nepafenac (ILEVRO) 0.3 % ophthalmic suspension Place 1 drop into the left eye daily.    . Potassium 99 MG TABS Take 99 mg by mouth daily.     . vitamin A 8000 UNIT capsule Take 8,000 Units by mouth daily  with lunch.      No current facility-administered medications for this visit.     Review of Systems Review of Systems  Blood pressure 128/74, pulse 84, resp. rate 12, height 5' 2.5" (1.588 m), weight 120 lb (54.4 kg), SpO2 99 %.  Physical Exam Physical Exam  Constitutional: She is oriented to person, place, and time. She appears well-developed and well-nourished.  HENT:  Mouth/Throat: Oropharynx is clear and moist.  Eyes: Conjunctivae are normal. No scleral icterus.  Neck: Neck supple.  Cardiovascular: Normal rate, regular rhythm and normal heart sounds.  Pulmonary/Chest: Effort normal and breath sounds normal. No respiratory distress. Right breast exhibits no inverted nipple, no mass, no nipple discharge, no skin change and no tenderness. Left breast exhibits no inverted nipple, no mass, no nipple discharge, no skin change and no tenderness.  1 cm firm area right breast in incision, stable  Lymphadenopathy:    She has no cervical adenopathy.    She has no axillary adenopathy.  Neurological: She is alert and oriented to person, place, and time.  Skin: Skin is warm and dry.  Psychiatric: Her behavior is normal.    Data Reviewed  Mammogram reviewed    Assessment    History of right breast cancer, T1cN0 M0, ER PR positive HER-2 negative .  Sandra [name] with high risk Patient is status post lumpectomy sentinel node biopsy radiation chemo.  She is now 2 years post treatment Currently on Arimidex and doing well  Exam is stable     Plan   Continue follow-up with oncology Patient will be asked to return to the office in one year with a bilateral diagnostic mammogram with Dr Byrnett.      HPI, Physical Exam, Assessment and Plan have been scribed under the direction and in the presence of S. G. [name], MD Marsha Hatch, RN I have completed the exam and reviewed the above documentation for accuracy and completeness.  I agree with the above.  [employer] has been used and any  errors in dictation or transcription are unintentional.  [name]. [name], M.D., F.A.C.S.  [name],[name] 07/18/2017, 10:56 AM   

## 2017-07-10 ENCOUNTER — Telehealth: Payer: Self-pay | Admitting: *Deleted

## 2017-07-10 DIAGNOSIS — Z79811 Long term (current) use of aromatase inhibitors: Secondary | ICD-10-CM

## 2017-07-10 NOTE — Telephone Encounter (Signed)
Patient asking we order a BMD test for her. Please advise

## 2017-07-11 NOTE — Telephone Encounter (Signed)
MD approved bone density testing- will enter order. Colette, please schedule and inform pt of apt.

## 2017-07-17 ENCOUNTER — Ambulatory Visit
Admission: RE | Admit: 2017-07-17 | Discharge: 2017-07-17 | Disposition: A | Discharge status: Home / Self Care | Payer: Medicare Other | Source: Ambulatory Visit | Attending: Internal Medicine | Admitting: Internal Medicine

## 2017-07-17 DIAGNOSIS — Z90710 Acquired absence of both cervix and uterus: Secondary | ICD-10-CM | POA: Insufficient documentation

## 2017-07-17 DIAGNOSIS — C50919 Malignant neoplasm of unspecified site of unspecified female breast: Secondary | ICD-10-CM | POA: Diagnosis not present

## 2017-07-17 DIAGNOSIS — Z79811 Long term (current) use of aromatase inhibitors: Secondary | ICD-10-CM | POA: Diagnosis present

## 2017-07-17 DIAGNOSIS — M8588 Other specified disorders of bone density and structure, other site: Secondary | ICD-10-CM | POA: Insufficient documentation

## 2017-07-17 DIAGNOSIS — M85851 Other specified disorders of bone density and structure, right thigh: Secondary | ICD-10-CM | POA: Insufficient documentation

## 2017-07-17 DIAGNOSIS — Z923 Personal history of irradiation: Secondary | ICD-10-CM | POA: Insufficient documentation

## 2017-07-17 DIAGNOSIS — Z9221 Personal history of antineoplastic chemotherapy: Secondary | ICD-10-CM | POA: Diagnosis not present

## 2017-07-17 DIAGNOSIS — Z780 Asymptomatic menopausal state: Secondary | ICD-10-CM | POA: Insufficient documentation

## 2017-08-19 ENCOUNTER — Other Ambulatory Visit: Payer: Self-pay | Admitting: Family Medicine

## 2017-08-19 NOTE — Telephone Encounter (Signed)
RB-Plz see refill req/thx dmf 

## 2017-08-24 IMAGING — MG MM BREAST SURGICAL SPECIMEN
1 series · 1 of 1 positions shown · non-contrast
Comparison: Previous exam(s).

CLINICAL DATA: Post surgical excision of right breast mass.

EXAM:
SPECIMEN RADIOGRAPH OF THE RIGHT BREAST

[R SPECIMEN]
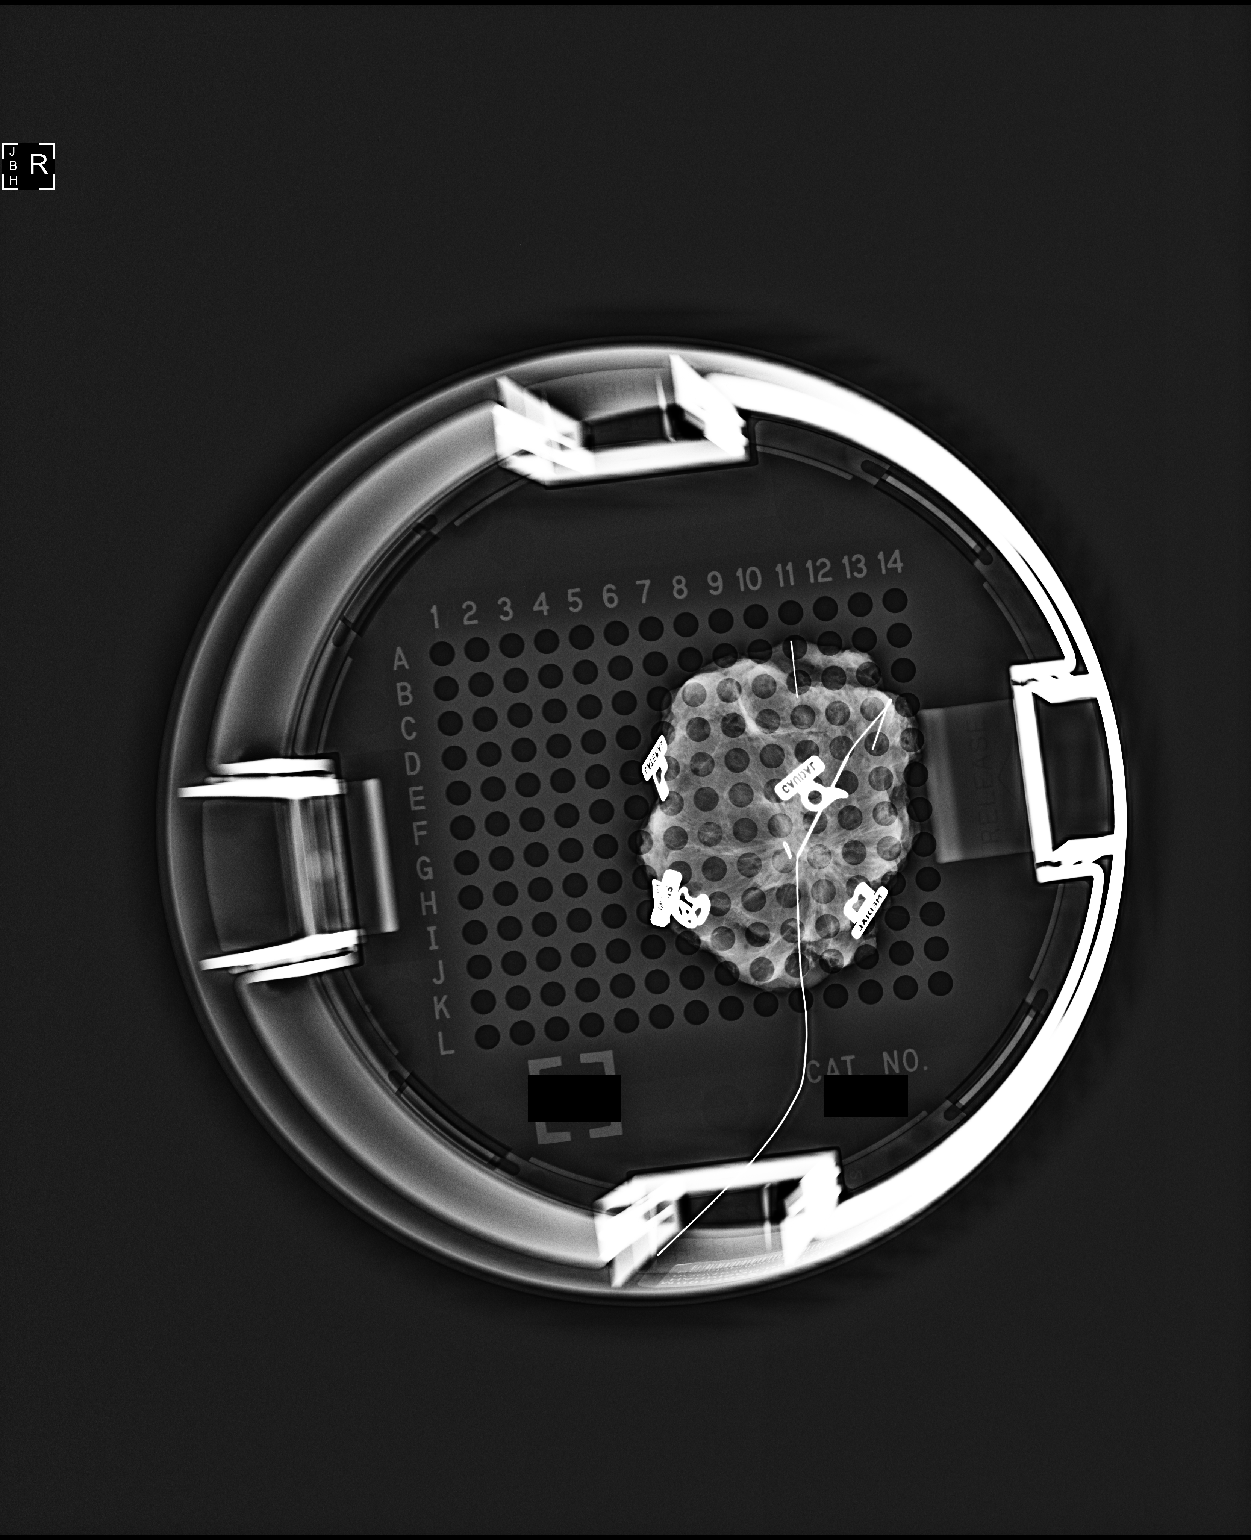

[1 of 1 positions shown; findings below may reference images not displayed]

FINDINGS: Status post excision of the right breast. The wire tip and biopsy
marker clip are present and are marked for pathology.
IMPRESSION: Specimen radiograph of the right breast.

## 2017-08-24 IMAGING — MG MM RT PLC BREAST LOC DEV   1ST LESION  INC MAMMO GUIDE
6 series · 6 of 6 positions shown · non-contrast
Comparison: Previous exams.

CLINICAL DATA: Preoperative needle localization of right breast 130
o'clock mass containing ribbon shaped post biopsy marker.

EXAM:
NEEDLE LOCALIZATION OF THE RIGHT BREAST WITH MAMMO GUIDANCE

[R CC (1 of 4)]
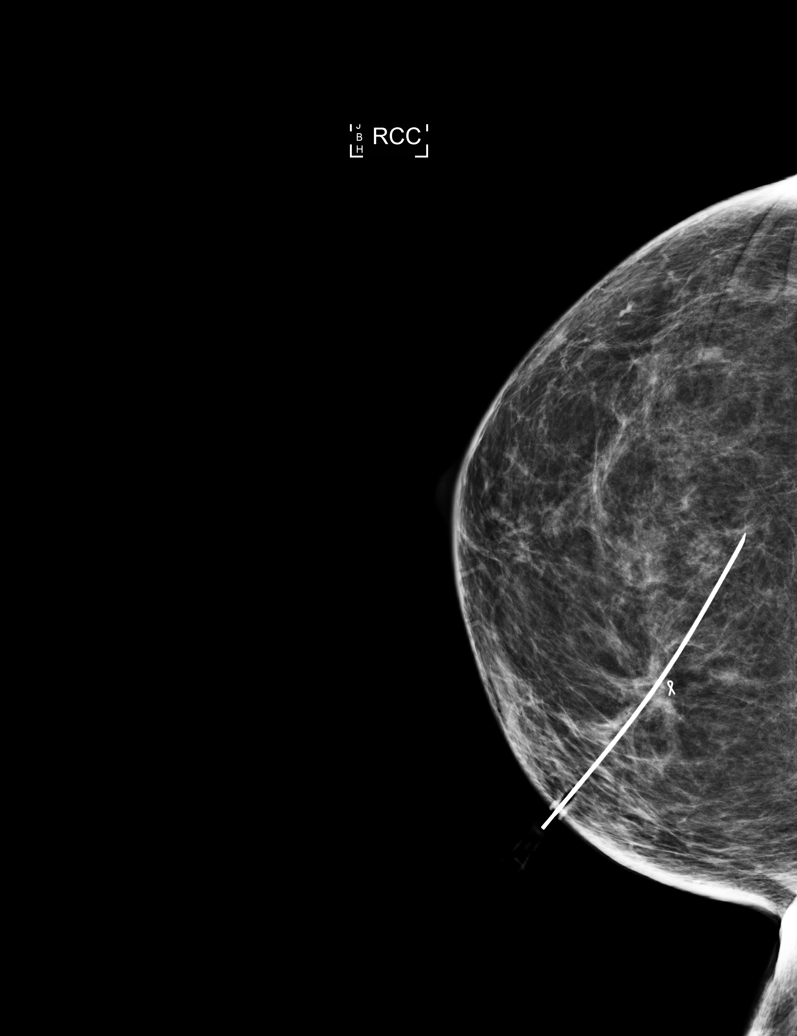

[R ML (1 of 2)]
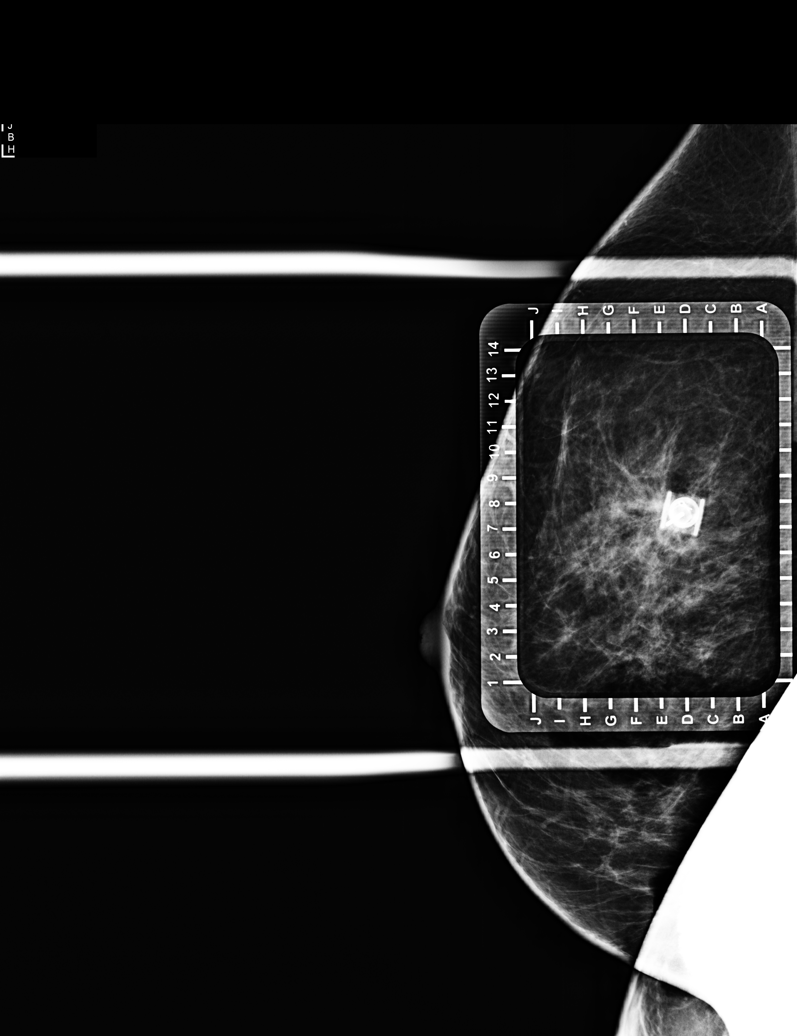

[R ML (2 of 2)]
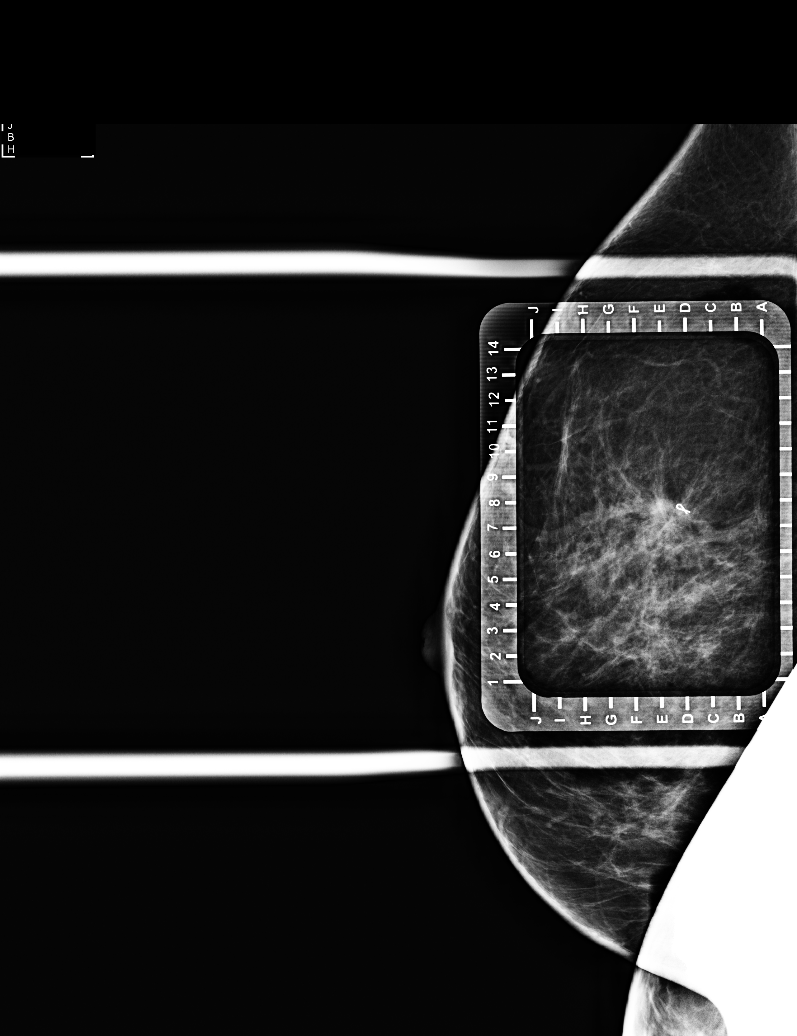

[R CC (2 of 4)]
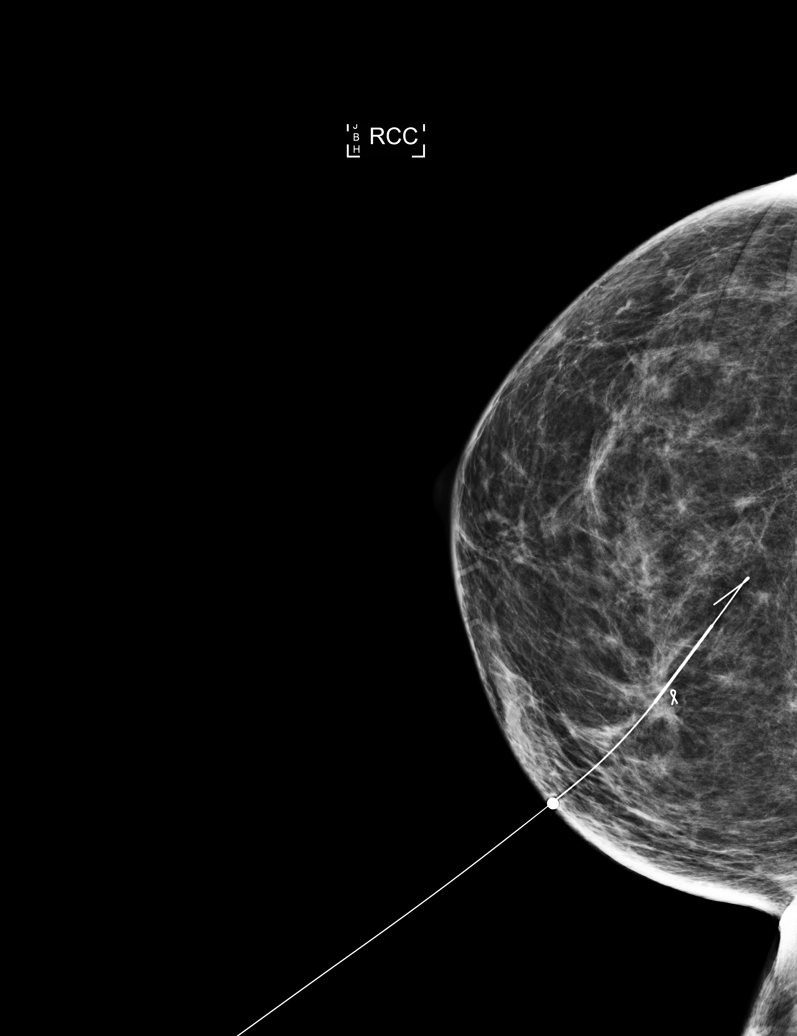

[R CC (3 of 4)]
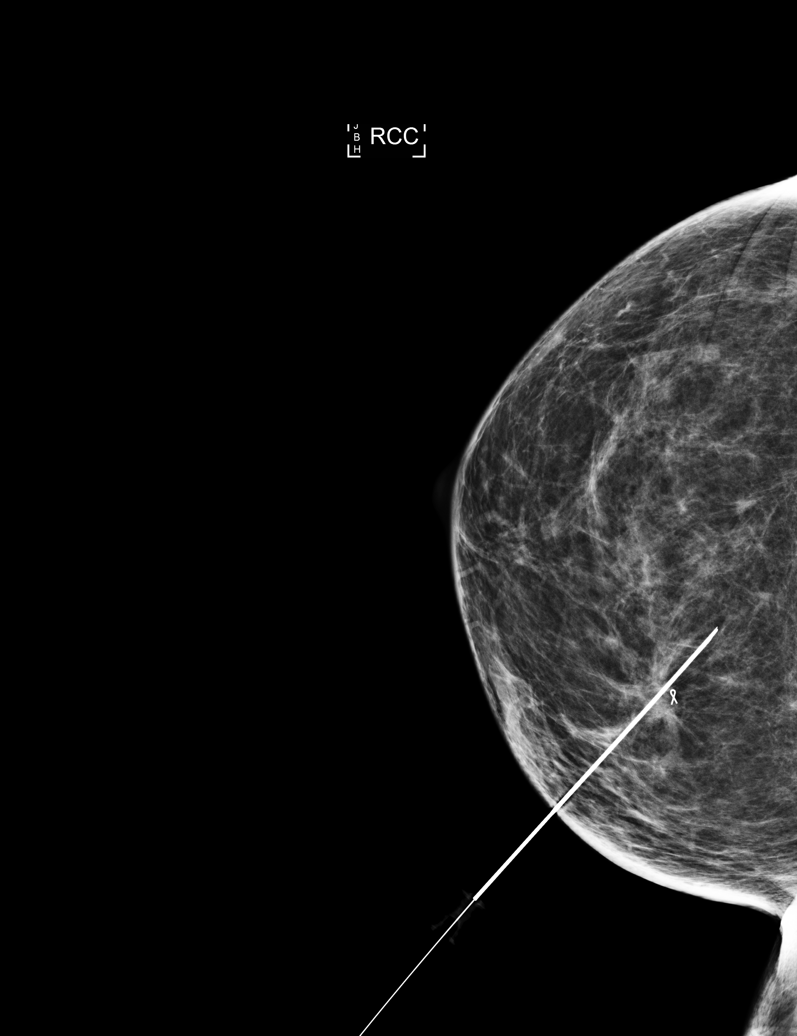

[R CC (4 of 4)]
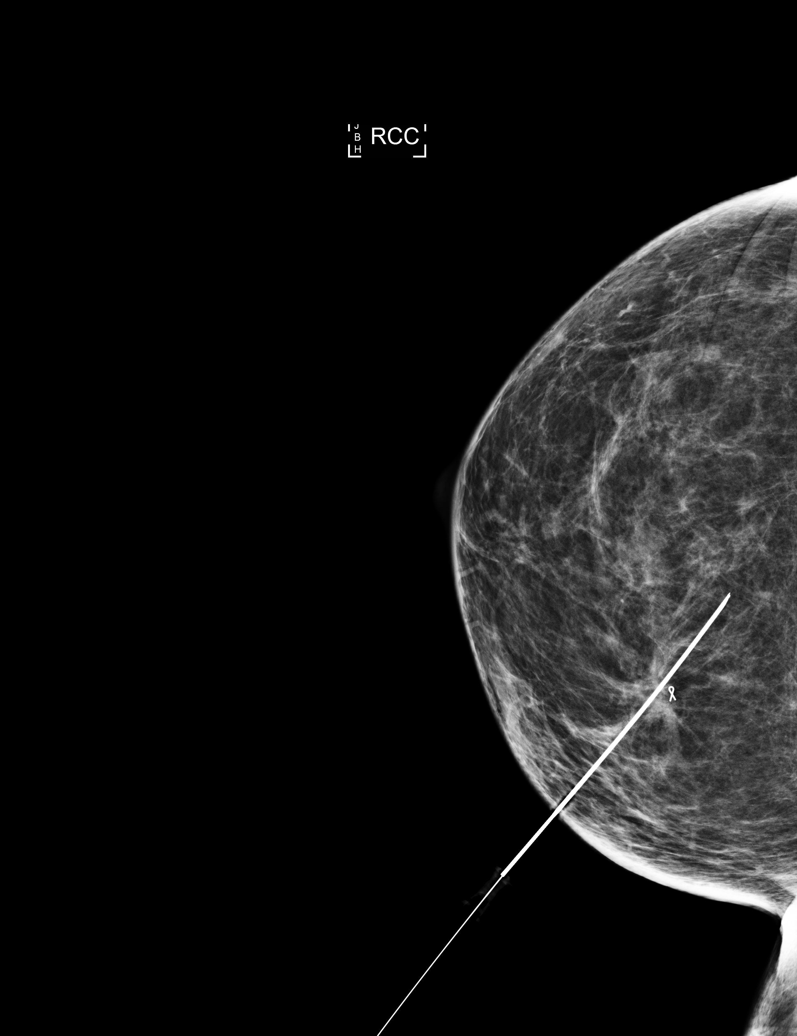

[6 of 6 positions shown; findings below may reference images not displayed]

FINDINGS: Patient presents for needle localization prior to right breast
lumpectomy. I met with the patient and we discussed the procedure of
needle localization including benefits and alternatives. We
discussed the high likelihood of a successful procedure. We
discussed the risks of the procedure, including infection, bleeding,
tissue injury, and further surgery. Informed, written consent was
given. The usual time-out protocol was performed immediately prior
to the procedure.

Using mammographic guidance, sterile technique, 2% lidocaine and a 7
cm modified Kopans needle, right breast 130 o'clock mass with
adjacent ribbon shaped post biopsy marker were localized using a
medial approach. The images were marked for Dr. Mulet.
IMPRESSION: Needle localization of the left breast. No apparent complications.

## 2017-10-02 ENCOUNTER — Other Ambulatory Visit: Payer: Self-pay | Admitting: Family Medicine

## 2017-11-07 ENCOUNTER — Inpatient Hospital Stay: Payer: Medicare Other | Admitting: Internal Medicine

## 2017-11-07 ENCOUNTER — Other Ambulatory Visit: Payer: Self-pay

## 2017-11-07 ENCOUNTER — Inpatient Hospital Stay: Payer: Medicare Other | Attending: Internal Medicine

## 2017-11-07 ENCOUNTER — Encounter: Payer: Self-pay | Admitting: Internal Medicine

## 2017-11-07 VITALS — BP 137/86 | HR 73 | Temp 97.9°F | Resp 18 | Ht 62.5 in | Wt 123.0 lb

## 2017-11-07 DIAGNOSIS — G620 Drug-induced polyneuropathy: Secondary | ICD-10-CM | POA: Insufficient documentation

## 2017-11-07 DIAGNOSIS — H9311 Tinnitus, right ear: Secondary | ICD-10-CM | POA: Insufficient documentation

## 2017-11-07 DIAGNOSIS — Z79811 Long term (current) use of aromatase inhibitors: Secondary | ICD-10-CM

## 2017-11-07 DIAGNOSIS — Z170 Estrogen receptor positive status [ER+]: Secondary | ICD-10-CM | POA: Insufficient documentation

## 2017-11-07 DIAGNOSIS — C50211 Malignant neoplasm of upper-inner quadrant of right female breast: Secondary | ICD-10-CM | POA: Insufficient documentation

## 2017-11-07 DIAGNOSIS — T451X5S Adverse effect of antineoplastic and immunosuppressive drugs, sequela: Secondary | ICD-10-CM | POA: Diagnosis not present

## 2017-11-07 DIAGNOSIS — M8580 Other specified disorders of bone density and structure, unspecified site: Secondary | ICD-10-CM | POA: Diagnosis not present

## 2017-11-07 DIAGNOSIS — D72819 Decreased white blood cell count, unspecified: Secondary | ICD-10-CM | POA: Diagnosis not present

## 2017-11-07 LAB — CBC WITH DIFFERENTIAL/PLATELET
BASOS ABS: 0 10*3/uL (ref 0–0.1)
Basophils Relative: 1 %
EOS ABS: 0.1 10*3/uL (ref 0–0.7)
Eosinophils Relative: 3 %
HCT: 38.7 % (ref 35.0–47.0)
HEMOGLOBIN: 13.3 g/dL (ref 12.0–16.0)
Lymphocytes Relative: 34 %
Lymphs Abs: 1.2 10*3/uL (ref 1.0–3.6)
MCH: 30.8 pg (ref 26.0–34.0)
MCHC: 34.5 g/dL (ref 32.0–36.0)
MCV: 89.3 fL (ref 80.0–100.0)
Monocytes Absolute: 0.3 10*3/uL (ref 0.2–0.9)
Monocytes Relative: 8 %
NEUTROS PCT: 54 %
Neutro Abs: 1.8 10*3/uL (ref 1.4–6.5)
Platelets: 296 10*3/uL (ref 150–440)
RBC: 4.33 MIL/uL (ref 3.80–5.20)
RDW: 13 % (ref 11.5–14.5)
WBC: 3.4 10*3/uL — AB (ref 3.6–11.0)

## 2017-11-07 LAB — BASIC METABOLIC PANEL
ANION GAP: 9 (ref 5–15)
BUN: 17 mg/dL (ref 6–20)
CO2: 26 mmol/L (ref 22–32)
CREATININE: 0.8 mg/dL (ref 0.44–1.00)
Calcium: 9.6 mg/dL (ref 8.9–10.3)
Chloride: 103 mmol/L (ref 101–111)
Glucose, Bld: 94 mg/dL (ref 65–99)
Potassium: 3.9 mmol/L (ref 3.5–5.1)
SODIUM: 138 mmol/L (ref 135–145)

## 2017-11-07 NOTE — Progress Notes (Signed)
[place] @ [facility] [phone]  [phone]   INITIAL CONSULT  Sandra [name] OB: 1945/10/09  MR#: [id number]  IFO#:[id number]  Patient Care Team: Jearld Fenton, NP as PCP - General (Internal Medicine) Glennie Isle, PA-C (Physician Assistant) Pyrtle, Lajuan Lines, MD as Consulting Physician (Gastroenterology) Lucille Passy, MD as Consulting Physician (Family Medicine) Christene Lye, MD (General Surgery) Cammie Sickle, MD as Consulting Physician (Internal Medicine) Leona Singleton, RN as Oncology Nurse Navigator Noreene Filbert, MD as Referring Physician (Radiation Oncology)  CHIEF COMPLAINT:  Chief Complaint  Patient presents with  . Breast Cancer   VISIT DIAGNOSIS:     ICD-10-CM   1. Carcinoma of upper-inner quadrant of right breast in female, estrogen receptor positive ([place]) C50.211 CBC with Differential/Platelet   Z17.0 Comprehensive metabolic panel     Oncology History    carcinoma breast (right) inner and upper quadrant T1 cN0 M0 tumor Estrogen receptor positive progesterone receptor positive HER-2 receptor negative by fish Mammo print shows high risk (diagnosis in January of 2016) patient has 22% average risk in 5 years and 29% average risk in 10 years for distant metastectomy disease without chemotherapy on anti-hormonal treatment 2.  Patient started Cytoxan and Adriamycin on now August 30, 2015 MUGA scan shows ejection fraction of baseline 55%.  3.Patient has finished total of 4 cycles of chemotherapy on November 01, 2015 4.  Started on the Taxol from November 22, 2015; FINISHED RT [sep 2017]  # OCT 2017- START ARIMIDEX   # NOV 2017- BMD- OSTEOPENIA- ca+vit D     Carcinoma of upper-inner quadrant of right breast in female, estrogen receptor positive ([place])     INTERVAL HISTORY: 72 year old lady History of stage I right-sided breast cancer ER/PR positive currently on Arimidex is here for follow-up.  Patient continues  to complain of mild to moderate tingling and numbness in her extremities; patient has had acupuncture-without any significant improvement.   No nausea no vomiting. No headaches. No chest pain or shortness of breath or cough. Patient denies any fevers or chills. Denies any nausea vomiting or constipation.   Patient complains of mild/moderate right-ear hearing difficulty/tinnitus.   REVIEW OF SYSTEMS:  A complete 10 point review of system is done which is negative for mentioned above in history of present illness.  PAST MEDICAL HISTORY: Past Medical History:  Diagnosis Date  . Breast cancer ([place]) 07/13/2015   RIGHT lumpectomy 07/13/15, pT1c, pN0,M0 Stage 1 Er/PR pos, her 2 neg. MammaPrint High  . Hyperlipidemia   . Hypertension   . Neuropathy   . 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 Personal history of chemotherapy 2016  . Personal history of radiation therapy 2016  . Visual blurriness    SOMETHING NEW THAT HAS DEVELOPED SINCE 07-13-15 SURGERY- SEEN AT [facility] ENT AND EXAM WAS NORMAL PER PT (08-09-15)    PAST SURGICAL HISTORY: Past Surgical History:  Procedure Laterality Date  . ABDOMINAL HYSTERECTOMY  1987  . BREAST BIOPSY Right    neg  . BREAST BIOPSY Left    4 oclock, FIBROEPITHELIAL LESION FAVOR CELLULAR FIBROADENOMA  . BREAST BIOPSY Left    2 oclock, CYSTIC APOCRINE METAPLASIA  . BREAST BIOPSY Right 07/13/15   130 INVASIVE MAMMARY CARCINOMA, NO SPECIAL TYPE  . BREAST LUMPECTOMY Left 07/13/2015  . BREAST LUMPECTOMY WITH SENTINEL LYMPH NODE BIOPSY Right 07/13/2015   Procedure: BREAST LUMPECTOMY WITH SENTINEL LYMPH NODE BX;  Surgeon: Christene Lye, MD;  Location: [facility] ORS;  Service: General;  Laterality: Right;  . BUNIONECTOMY Right 1993  . CATARACT EXTRACTION W/PHACO Left 09/17/2016   Procedure: CATARACT EXTRACTION PHACO AND INTRAOCULAR LENS PLACEMENT (IOC);  Surgeon: Birder Robson, MD;  Location: [facility] ORS;  Service: Ophthalmology;  Laterality:  Left;  [place] 00:47AP% 17.6CDE 8.39Fluid pack lot # [id number] H  . CATARACT EXTRACTION W/PHACO Right 01/21/2017   Procedure: CATARACT EXTRACTION PHACO AND INTRAOCULAR LENS PLACEMENT (IOC);  Surgeon: Birder Robson, MD;  Location: [facility] ORS;  Service: Ophthalmology;  Laterality: Right;  [place] 00:39 AP% 13.0 CDE 5.16 fluid pack lot # [id number] H  . fatty tumor Left 2013   side  . PORT-A-CATH REMOVAL  2017  . PORTACATH PLACEMENT Left 08/16/2015   Procedure: INSERTION PORT-A-CATH;  Surgeon: Christene Lye, MD;  Location: [facility] ORS;  Service: General;  Laterality: Left;    FAMILY HISTORY Family History  Problem Relation Age of Onset  . Cancer Sister 44       breast cancer  . Breast cancer Sister 66  . Colon cancer Neg Hx         ADVANCED DIRECTIVES:   Patient does not have any living will or healthcare power of attorney.  Information was given .  Available resources had been discussed.  We will follow-up on subsequent appointments regarding this issue HEALTH MAINTENANCE: Social History   Tobacco Use  . Smoking status: Never Smoker  . Smokeless tobacco: Never Used  Substance Use Topics  . Alcohol use: No    Alcohol/week: 0.0 oz  . Drug use: No      :  Allergies  Allergen Reactions  . Cymbalta [Duloxetine Hcl] Other (See Comments)    Glaucoma.  Unable to take  . Lovastatin Anaphylaxis    Tongue swelling    Current Outpatient Medications  Medication Sig Dispense Refill  . amLODipine (NORVASC) 5 MG tablet TAKE 1 TABLET BY MOUTH ONCE DAILY 90 tablet 1  . anastrozole (ARIMIDEX) 1 MG tablet TAKE 1 TABLET BY MOUTH ONCE DAILY 90 tablet 1  . b complex vitamins tablet Take 1 tablet by mouth daily.    . Calcium Carbonate-Vitamin D (CALCIUM 500 + D PO) Take 2 tablets by mouth daily.    Marland Kitchen lisinopril (PRINIVIL,ZESTRIL) 10 MG tablet TAKE ONE TABLET BY MOUTH ONCE DAILY 90 tablet 3  . Potassium 99 MG TABS Take 99 mg by mouth daily.     . vitamin A 8000 UNIT capsule Take 8,000 Units by  mouth daily with lunch.      No current facility-administered medications for this visit.     OBJECTIVE: PHYSICAL EXAM: GENERAL:  Well developed, well nourished, sitting comfortably in the exam room in no acute distress. Accompanied by her husband.  MENTAL STATUS:  Alert and oriented to person, place and time.  ENT:  Oropharynx clear without lesion.  Tongue normal. Mucous membranes moist. Alopecia.  Right ear earwax noted-  Examination of the nose shows redness.  No active bleeding. RESPIRATORY:  Clear to auscultation without rales, wheezes or rhonchi. Patient has a port placed on the left upper chest wall area.  No evidence of redness. CARDIOVASCULAR:  Regular rate and rhythm without murmur, rub or gallop.  ABDOMEN:  Soft, non-tender, with active bowel sounds, and no hepatosplenomegaly.  No masses. BACK:  No CVA tenderness.  No tenderness on percussion of the back or rib cage. SKIN:  No rashes, ulcers or lesions. EXTREMITIES: No edema, no skin discoloration or tenderness.  No palpable cords. LYMPH NODES: No  palpable cervical, supraclavicular, axillary or inguinal adenopathy  NEUROLOGICAL: Unremarkable. PSYCH:  Appropriate.   Vitals:   11/07/17 1430  BP: 137/86  Pulse: 73  Resp: 18  Temp: 97.9 F (36.6 C)     Body mass index is 22.14 kg/m.    ECOG FS:0 - Asymptomatic  LAB RESULTS:  Appointment on 11/07/2017  Component Date Value Ref Range Status  . Sodium 11/07/2017 138  135 - 145 mmol/L Final  . Potassium 11/07/2017 3.9  3.5 - 5.1 mmol/L Final  . Chloride 11/07/2017 103  101 - 111 mmol/L Final  . CO2 11/07/2017 26  22 - 32 mmol/L Final  . Glucose, Bld 11/07/2017 94  65 - 99 mg/dL Final  . BUN 11/07/2017 17  6 - 20 mg/dL Final  . Creatinine, Ser 11/07/2017 0.80  0.44 - 1.00 mg/dL Final  . Calcium 11/07/2017 9.6  8.9 - 10.3 mg/dL Final  . GFR calc non Af Amer 11/07/2017 >60  >60 mL/min Final  . GFR calc Af Amer 11/07/2017 >60  >60 mL/min Final   Comment: (NOTE) The  eGFR has been calculated using the CKD EPI equation. This calculation has not been validated in all clinical situations. eGFR's persistently <60 mL/min signify possible Chronic Kidney Disease.   Georgiann Hahn gap 11/07/2017 9  5 - 15 Final   Performed at [facility], [address] [place], [place] [address]  . WBC 11/07/2017 3.4* 3.6 - 11.0 K/uL Final  . RBC 11/07/2017 4.33  3.80 - 5.20 MIL/uL Final  . Hemoglobin 11/07/2017 13.3  12.0 - 16.0 g/dL Final  . HCT 11/07/2017 38.7  35.0 - 47.0 % Final  . MCV 11/07/2017 89.3  80.0 - 100.0 fL Final  . MCH 11/07/2017 30.8  26.0 - 34.0 pg Final  . MCHC 11/07/2017 34.5  32.0 - 36.0 g/dL Final  . RDW 11/07/2017 13.0  11.5 - 14.5 % Final  . Platelets 11/07/2017 296  150 - 440 K/uL Final  . Neutrophils Relative % 11/07/2017 54  % Final  . Neutro Abs 11/07/2017 1.8  1.4 - 6.5 K/uL Final  . Lymphocytes Relative 11/07/2017 34  % Final  . Lymphs Abs 11/07/2017 1.2  1.0 - 3.6 K/uL Final  . Monocytes Relative 11/07/2017 8  % Final  . Monocytes Absolute 11/07/2017 0.3  0.2 - 0.9 K/uL Final  . Eosinophils Relative 11/07/2017 3  % Final  . Eosinophils Absolute 11/07/2017 0.1  0 - 0.7 K/uL Final  . Basophils Relative 11/07/2017 1  % Final  . Basophils Absolute 11/07/2017 0.0  0 - 0.1 K/uL Final   Performed at [facility], [address] [place], [place] [address]     STUDIES: No results found.  ASSESSMENT:   Carcinoma of upper-inner quadrant of right breast in female, estrogen receptor positive ([place]) Stage I ER/PR positive HER-2/neu negative;  On Anastrazole;  # No clinical evidence of recurrence.  Continue anastrozole at this time.  Tolerating without any major side effects.   # Osteopenia- BMD Nov 2017- continue calcium and vitamin D.  # mild leucopenia-today 3.4/ overall improved; ANC- 1.8;  monitor for now.   # tininitus- right; no heairng issues; recommend if wose recommend ENT  # Grade 2 neuropathy- STABLE.s/p acupuncture-no  significant improvement.   # follow up in 6 months/labs.   No matching staging information was found for the patient.  Cammie Sickle, MD   11/09/2017 6:58 PM

## 2017-11-07 NOTE — Assessment & Plan Note (Addendum)
Stage I ER/PR positive HER-2/neu negative;  On Anastrazole;  # No clinical evidence of recurrence.  Continue anastrozole at this time.  Tolerating without any major side effects.   # Osteopenia- BMD Nov 2017- continue calcium and vitamin D.  # mild leucopenia-today 3.4/ overall improved; ANC- 1.8;  monitor for now.   # tininitus- right; no heairng issues; recommend if wose recommend ENT  # Grade 2 neuropathy- STABLE.s/p acupuncture-no significant improvement.   # follow up in 6 months/labs.

## 2017-12-30 ENCOUNTER — Other Ambulatory Visit: Payer: Self-pay | Admitting: Internal Medicine

## 2017-12-30 DIAGNOSIS — C50211 Malignant neoplasm of upper-inner quadrant of right female breast: Secondary | ICD-10-CM

## 2017-12-30 DIAGNOSIS — Z170 Estrogen receptor positive status [ER+]: Principal | ICD-10-CM

## 2017-12-30 DIAGNOSIS — Z95828 Presence of other vascular implants and grafts: Secondary | ICD-10-CM

## 2018-03-31 ENCOUNTER — Other Ambulatory Visit: Payer: Self-pay | Admitting: Internal Medicine

## 2018-04-28 ENCOUNTER — Ambulatory Visit (INDEPENDENT_AMBULATORY_CARE_PROVIDER_SITE_OTHER): Payer: Medicare Other | Admitting: Internal Medicine

## 2018-04-28 ENCOUNTER — Encounter: Payer: Self-pay | Admitting: Internal Medicine

## 2018-04-28 ENCOUNTER — Encounter (INDEPENDENT_AMBULATORY_CARE_PROVIDER_SITE_OTHER): Payer: Self-pay

## 2018-04-28 VITALS — BP 118/80 | HR 68 | Temp 98.0°F | Ht 61.5 in | Wt 120.0 lb

## 2018-04-28 DIAGNOSIS — H6123 Impacted cerumen, bilateral: Secondary | ICD-10-CM

## 2018-04-28 DIAGNOSIS — T451X5A Adverse effect of antineoplastic and immunosuppressive drugs, initial encounter: Secondary | ICD-10-CM

## 2018-04-28 DIAGNOSIS — G620 Drug-induced polyneuropathy: Secondary | ICD-10-CM | POA: Diagnosis not present

## 2018-04-28 DIAGNOSIS — I10 Essential (primary) hypertension: Secondary | ICD-10-CM

## 2018-04-28 DIAGNOSIS — Z0000 Encounter for general adult medical examination without abnormal findings: Secondary | ICD-10-CM

## 2018-04-28 DIAGNOSIS — E559 Vitamin D deficiency, unspecified: Secondary | ICD-10-CM

## 2018-04-28 DIAGNOSIS — E7800 Pure hypercholesterolemia, unspecified: Secondary | ICD-10-CM | POA: Diagnosis not present

## 2018-04-28 DIAGNOSIS — Z170 Estrogen receptor positive status [ER+]: Secondary | ICD-10-CM

## 2018-04-28 DIAGNOSIS — Z23 Encounter for immunization: Secondary | ICD-10-CM

## 2018-04-28 DIAGNOSIS — C50211 Malignant neoplasm of upper-inner quadrant of right female breast: Secondary | ICD-10-CM

## 2018-04-28 LAB — COMPREHENSIVE METABOLIC PANEL
ALK PHOS: 43 U/L (ref 39–117)
ALT: 14 U/L (ref 0–35)
AST: 22 U/L (ref 0–37)
Albumin: 4.1 g/dL (ref 3.5–5.2)
BUN: 14 mg/dL (ref 6–23)
CHLORIDE: 105 meq/L (ref 96–112)
CO2: 29 meq/L (ref 19–32)
Calcium: 9.2 mg/dL (ref 8.4–10.5)
Creatinine, Ser: 0.87 mg/dL (ref 0.40–1.20)
GFR: 82.02 mL/min (ref >60.00)
GLUCOSE: 98 mg/dL (ref 70–99)
SODIUM: 140 meq/L (ref 135–145)
Total Bilirubin: 0.7 mg/dL (ref 0.2–1.2)
Total Protein: 7 g/dL (ref 6.0–8.3)

## 2018-04-28 LAB — CBC
HEMATOCRIT: 37.6 % (ref 36.0–46.0)
HEMOGLOBIN: 12.7 g/dL (ref 12.0–15.0)
MCHC: 33.8 g/dL (ref 30.0–36.0)
MCV: 88.6 fl (ref 78.0–100.0)
Platelets: 295 10*3/uL (ref 150.0–400.0)
RBC: 4.24 Mil/uL (ref 3.87–5.11)
RDW: 12.6 % (ref 11.5–15.5)
WBC: 2.7 10*3/uL — ABNORMAL LOW (ref 4.0–10.5)

## 2018-04-28 LAB — LIPID PANEL
CHOL/HDL RATIO: 2
Cholesterol: 164 mg/dL (ref 0–200)
HDL: 77.5 mg/dL (ref >39.00)
LDL CALC: 79 mg/dL (ref 0–99)
NONHDL: 86.01
Triglycerides: 37 mg/dL (ref 0.0–149.0)
VLDL: 7.4 mg/dL (ref 0.0–40.0)

## 2018-04-28 LAB — VITAMIN D 25 HYDROXY (VIT D DEFICIENCY, FRACTURES): VITD: 74.47 ng/mL (ref 30.00–100.00)

## 2018-04-28 MED ORDER — GABAPENTIN 100 MG PO CAPS: 100.0000 mg | ORAL_CAPSULE | Freq: Two times a day (BID) | ORAL | 2 refills | Status: DC

## 2018-04-28 NOTE — Assessment & Plan Note (Signed)
On Arimidex She will continue to follow with oncology

## 2018-04-28 NOTE — Progress Notes (Signed)
HPI:  Pt presents to the clinic today for her Medicare Wellness Exam. She is also due to follow up chronic conditions.  History of Right Breast Cancer: s/p lumpectomy and chemo. 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 She is taking Arimedex as prescribed.   HTN: Her BP today is 118/80. She is taking Lisinopril and Amlodipine as prescribed. ECG from 07/2015 reviewed.  HLD: Her last LDL was 128, 04/2017. She is not taking any cholesterol lowering medication at this time. She tries to consume a low fat diet.   Past Medical History:  Diagnosis Date  . Breast cancer ([place]) 07/13/2015   RIGHT lumpectomy 07/13/15, pT1c, pN0,M0 Stage 1 Er/PR pos, her 2 neg. MammaPrint High  . Hyperlipidemia   . Hypertension   . Neuropathy   . 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 Personal history of chemotherapy 2016  . Personal history of radiation therapy 2016  . Visual blurriness    SOMETHING NEW THAT HAS DEVELOPED SINCE 07-13-15 SURGERY- SEEN AT [facility] AND EXAM WAS NORMAL PER PT (08-09-15)    Current Outpatient Medications  Medication Sig Dispense Refill  . amLODipine (NORVASC) 5 MG tablet TAKE 1 TABLET BY MOUTH ONCE DAILY 90 tablet 0  . anastrozole (ARIMIDEX) 1 MG tablet TAKE 1 TABLET BY MOUTH ONCE DAILY 90 tablet 1  . b complex vitamins tablet Take 1 tablet by mouth daily.    . Calcium Carbonate-Vitamin D (CALCIUM 500 + D PO) Take 2 tablets by mouth daily.    Marland Kitchen lisinopril (PRINIVIL,ZESTRIL) 10 MG tablet TAKE ONE TABLET BY MOUTH ONCE DAILY 90 tablet 3  . Potassium 99 MG TABS Take 99 mg by mouth daily.     . vitamin A 8000 UNIT capsule Take 8,000 Units by mouth daily with lunch.      No current facility-administered medications for this visit.     Allergies  Allergen Reactions  . Cymbalta [Duloxetine Hcl] Other (See Comments)    Glaucoma.  Unable to take  . Lovastatin Anaphylaxis    Tongue swelling    Family History  Problem Relation Age of Onset  . Cancer Sister  34       breast cancer  . Breast cancer Sister 85  . Colon cancer Neg Hx     Social History   Socioeconomic History  . Marital status: Married    Spouse name: Not on file  . Number of children: Not on file  . Years of education: Not on file  . Highest education level: Not on file  Occupational History  . Not on file  Social Needs  . Financial resource strain: Not on file  . Food insecurity:    Worry: Not on file    Inability: Not on file  . Transportation needs:    Medical: Not on file    Non-medical: Not on file  Tobacco Use  . Smoking status: Never Smoker  . Smokeless tobacco: Never Used  Substance and Sexual Activity  . Alcohol use: No    Alcohol/week: 0.0 standard drinks  . Drug use: No  . Sexual activity: Yes  Lifestyle  . Physical activity:    Days per week: Not on file    Minutes per session: Not on file  . Stress: Not on file  Relationships  . Social connections:    Talks on phone: Not on file    Gets together: Not on file    Attends religious service: Not on file  Active member of club or organization: Not on file    Attends meetings of clubs or organizations: Not on file    Relationship status: Not on file  . Intimate partner violence:    Fear of current or ex partner: Not on file    Emotionally abused: Not on file    Physically abused: Not on file    Forced sexual activity: Not on file  Other Topics Concern  . Not on file  Social History Narrative   End of life wishes (updated 01/2013)-   Daughter of wishes (Cherlyl Duwayne Heck)      Desires CPR.   Desires life support if reasonable.          Hospitiliaztions: None  Health Maintenance:    Flu: 04/2017  Tetanus: unsure  Pneumovax: 01/2012  Prevnar: 04/2014  Zostavax: 01/2012  Shingrix: never  Mammogram: 06/2017  Pap Smear: hysterectomy  Bone Density: 07/2017  Colon Screening: 06/2014  Eye Doctor: annually  Dental Exam: as needed   Providers:   PCP: Webb Silversmith, NP-C  Oncologist:  Dr. Janeth Rase   I have personally reviewed and have noted:  1. The patient's medical and social history 2. Their use of alcohol, tobacco or illicit drugs 3. Their current medications and supplements 4. The patient's functional ability including ADL's, fall risks, home safety risks and hearing or visual impairment. 5. Diet and physical activities 6. Evidence for depression or mood disorder  Subjective:   Review of Systems:   Constitutional: Denies fever, malaise, fatigue, headache or abrupt weight changes.  HEENT: Denies eye pain, eye redness, ear pain, ringing in the ears, wax buildup, runny nose, nasal congestion, bloody nose, or sore throat. Respiratory: Denies difficulty breathing, shortness of breath, cough or sputum production.   Cardiovascular: Denies chest pain, chest tightness, palpitations or swelling in the hands or feet.  Gastrointestinal: Denies abdominal pain, bloating, constipation, diarrhea or blood in the stool.  GU: Denies urgency, frequency, pain with urination, burning sensation, blood in urine, odor or discharge. Musculoskeletal: Denies decrease in range of motion, difficulty with gait, muscle pain or joint pain and swelling.  Skin: Denies redness, rashes, lesions or ulcercations.  Neurological: pt reports burning, numbness and tingling of bilateral upper extremities. Denies dizziness, difficulty with memory, difficulty with speech or problems with balance and coordination.  Psych: Denies anxiety, depression, SI/HI.  No other specific complaints in a complete review of systems (except as listed in HPI above).  Objective:  PE:   BP 118/80   Pulse 68   Temp 98 F (36.7 C) (Oral)   Ht 5' 1.5" (1.562 m)   Wt 120 lb (54.4 kg)   LMP  (LMP Unknown)   SpO2 98%   BMI 22.31 kg/m   Wt Readings from Last 3 Encounters:  11/07/17 123 lb (55.8 kg)  07/09/17 120 lb (54.4 kg)  05/20/17 116 lb (52.6 kg)    General: Appears her stated age, well developed, well  nourished in NAD. Skin: Warm, dry and intact.  HEENT: Head: normal shape and size; Eyes: sclera white, no icterus, conjunctiva pink, PERRLA and EOMs intact; Ears: bilateral cerumen impaction; Throat/Mouth: Teeth present, mucosa pink and moist, no exudate, lesions or ulcerations noted.  Neck: Neck supple, trachea midline. No masses, lumps or thyromegaly present.  Cardiovascular: Normal rate and rhythm. S1,S2 noted.  No murmur, rubs or gallops noted. No JVD or BLE edema. No carotid bruits noted. Pulmonary/Chest: Normal effort and positive vesicular breath sounds. No respiratory distress. No wheezes, rales or  ronchi noted.  Abdomen: Soft and nontender. Normal bowel sounds. No distention or masses noted. Liver, spleen and kidneys non palpable. Musculoskeletal: Strength 5/5 BUE/BLE. No signs of joint swelling.  Neurological: Alert and oriented. Cranial nerves II-XII grossly intact. Coordination normal.  Psychiatric: Mood and affect normal. Behavior is normal. Judgment and thought content normal.     BMET    Component Value Date/Time   NA 138 11/07/2017 1339   K 3.9 11/07/2017 1339   K 4.0 02/24/2012 0743   CL 103 11/07/2017 1339   CO2 26 11/07/2017 1339   GLUCOSE 94 11/07/2017 1339   BUN 17 11/07/2017 1339   CREATININE 0.80 11/07/2017 1339   CALCIUM 9.6 11/07/2017 1339   GFRNONAA >60 11/07/2017 1339   GFRAA >60 11/07/2017 1339    Lipid Panel     Component Value Date/Time   CHOL 178 04/17/2017 1018   TRIG 38.0 04/17/2017 1018   HDL 94.50 04/17/2017 1018   CHOLHDL 2 04/17/2017 1018   VLDL 7.6 04/17/2017 1018   LDLCALC 76 04/17/2017 1018    CBC    Component Value Date/Time   WBC 3.4 (L) 11/07/2017 1339   RBC 4.33 11/07/2017 1339   HGB 13.3 11/07/2017 1339   HGB 13.4 11/08/2014 1059   HCT 38.7 11/07/2017 1339   HCT 39.8 11/08/2014 1059   PLT 296 11/07/2017 1339   PLT 318 11/08/2014 1059   MCV 89.3 11/07/2017 1339   MCV 88 11/08/2014 1059   MCH 30.8 11/07/2017 1339   MCHC  34.5 11/07/2017 1339   RDW 13.0 11/07/2017 1339   RDW 12.7 11/08/2014 1059   LYMPHSABS 1.2 11/07/2017 1339   LYMPHSABS 1.3 11/08/2014 1059   MONOABS 0.3 11/07/2017 1339   MONOABS 0.3 11/08/2014 1059   EOSABS 0.1 11/07/2017 1339   EOSABS 0.2 11/08/2014 1059   BASOSABS 0.0 11/07/2017 1339   BASOSABS 0.0 11/08/2014 1059    Hgb A1C Lab Results  Component Value Date   HGBA1C 5.1 07/26/2015      Assessment and Plan:   Medicare Annual Wellness Visit:  Diet: She does eat some meat, mostly seafood. She consumes some veggies and fruits. She tries to avoid fried foods. She drinks mostly water. Physical activity: workout at gym 4 days a week with trainer. Depression/mood screen: Negative Hearing: Intact to whispered voice Visual acuity: Grossly normal, performs annual eye exam  ADLs: Capable Fall risk: None Home safety: Good Cognitive evaluation: Intact to orientation, naming, recall and repetition EOL planning: Living will, full code/ I agree  Preventative Medicine: Flu shot today. She declines tetanus for insurance reasons. Pneumovax, prevnar and zostavax UTD. She declines shingrix. She will schedule her mammogram for 06/2018. She no longer needs pap smears. Bone density and colon screening UTD. Encouraged her to consume a balanced diet and exercise regimen. Advised her to see an eye doctor and dentist annually. Will check CBC, CMET, Lipid and Vit D today.  Bilateral Cerumen Impaction:  Manual lavage by CMA Advised her to use Debrox 2 x week to prevent wax buildup  Next appointment: 1 year, Medicare Wellness Exam   Webb Silversmith, NP

## 2018-04-28 NOTE — Assessment & Plan Note (Signed)
Controlled on Lisinopril and Amlodipine CBC and CMET today Reinforced DASH diet and aerobic exercise

## 2018-04-28 NOTE — Assessment & Plan Note (Signed)
Will trial Gabapentin, she has taken in the past She will update me with effectiveness in 1 month

## 2018-04-28 NOTE — Patient Instructions (Signed)
Health Maintenance for Postmenopausal Women Menopause is a normal process in which your reproductive ability comes to an end. This process happens gradually over a span of months to years, usually between the ages of 22 and 9. Menopause is complete when you have missed 12 consecutive menstrual periods. It is important to talk with your health care provider about some of the most common conditions that affect postmenopausal women, such as heart disease, cancer, and bone loss (osteoporosis). Adopting a healthy lifestyle and getting preventive care can help to promote your health and wellness. Those actions can also lower your chances of developing some of these common conditions. What should I know about menopause? During menopause, you may experience a number of symptoms, such as:  Moderate-to-severe hot flashes.  Night sweats.  Decrease in sex drive.  Mood swings.  Headaches.  Tiredness.  Irritability.  Memory problems.  Insomnia.  Choosing to treat or not to treat menopausal changes is an individual decision that you make with your health care provider. What should I know about hormone replacement therapy and supplements? Hormone therapy products are effective for treating symptoms that are associated with menopause, such as hot flashes and night sweats. Hormone replacement carries certain risks, especially as you become older. If you are thinking about using estrogen or estrogen with progestin treatments, discuss the benefits and risks with your health care provider. What should I know about heart disease and stroke? Heart disease, heart attack, and stroke become more likely as you age. This may be due, in part, to the hormonal changes that your body experiences during menopause. These can affect how your body processes dietary fats, triglycerides, and cholesterol. Heart attack and stroke are both medical emergencies. There are many things that you can do to help prevent heart disease  and stroke:  Have your blood pressure checked at least every 1-2 years. High blood pressure causes heart disease and increases the risk of stroke.  If you are 53-22 years old, ask your health care provider if you should take aspirin to prevent a heart attack or a stroke.  Do not use any tobacco products, including cigarettes, chewing tobacco, or electronic cigarettes. If you need help quitting, ask your health care provider.  It is important to eat a healthy diet and maintain a healthy weight. ? Be sure to include plenty of vegetables, fruits, low-fat dairy products, and lean protein. ? Avoid eating foods that are high in solid fats, added sugars, or salt (sodium).  Get regular exercise. This is one of the most important things that you can do for your health. ? Try to exercise for at least 150 minutes each week. The type of exercise that you do should increase your heart rate and make you sweat. This is known as moderate-intensity exercise. ? Try to do strengthening exercises at least twice each week. Do these in addition to the moderate-intensity exercise.  Know your numbers.Ask your health care provider to check your cholesterol and your blood glucose. Continue to have your blood tested as directed by your health care provider.  What should I know about cancer screening? There are several types of cancer. Take the following steps to reduce your risk and to catch any cancer development as early as possible. Breast Cancer  Practice breast self-awareness. ? This means understanding how your breasts normally appear and feel. ? It also means doing regular breast self-exams. Let your health care provider know about any changes, no matter how small.  If you are 40  or older, have a clinician do a breast exam (clinical breast exam or CBE) every year. Depending on your age, family history, and medical history, it may be recommended that you also have a yearly breast X-ray (mammogram).  If you  have a family history of breast cancer, talk with your health care provider about genetic screening.  If you are at high risk for breast cancer, talk with your health care provider about having an MRI and a mammogram every year.  Breast cancer (BRCA) gene test is recommended for women who have family members with BRCA-related cancers. Results of the assessment will determine the need for genetic counseling and BRCA1 and for BRCA2 testing. BRCA-related cancers include these types: ? Breast. This occurs in males or females. ? Ovarian. ? Tubal. This may also be called fallopian tube cancer. ? Cancer of the abdominal or pelvic lining (peritoneal cancer). ? Prostate. ? Pancreatic.  Cervical, Uterine, and Ovarian Cancer Your health care provider may recommend that you be screened regularly for cancer of the pelvic organs. These include your ovaries, uterus, and vagina. This screening involves a pelvic exam, which includes checking for microscopic changes to the surface of your cervix (Pap test).  For women ages 21-65, health care providers may recommend a pelvic exam and a Pap test every three years. For women ages 79-65, they may recommend the Pap test and pelvic exam, combined with testing for human papilloma virus (HPV), every five years. Some types of HPV increase your risk of cervical cancer. Testing for HPV may also be done on women of any age who have unclear Pap test results.  Other health care providers may not recommend any screening for nonpregnant women who are considered low risk for pelvic cancer and have no symptoms. Ask your health care provider if a screening pelvic exam is right for you.  If you have had past treatment for cervical cancer or a condition that could lead to cancer, you need Pap tests and screening for cancer for at least 20 years after your treatment. If Pap tests have been discontinued for you, your risk factors (such as having a new sexual partner) need to be  reassessed to determine if you should start having screenings again. Some women have medical problems that increase the chance of getting cervical cancer. In these cases, your health care provider may recommend that you have screening and Pap tests more often.  If you have a family history of uterine cancer or ovarian cancer, talk with your health care provider about genetic screening.  If you have vaginal bleeding after reaching menopause, tell your health care provider.  There are currently no reliable tests available to screen for ovarian cancer.  Lung Cancer Lung cancer screening is recommended for adults 69-62 years old who are at high risk for lung cancer because of a history of smoking. A yearly low-dose CT scan of the lungs is recommended if you:  Currently smoke.  Have a history of at least 30 pack-years of smoking and you currently smoke or have quit within the past 15 years. A pack-year is smoking an average of one pack of cigarettes per day for one year.  Yearly screening should:  Continue until it has been 15 years since you quit.  Stop if you develop a health problem that would prevent you from having lung cancer treatment.  Colorectal Cancer  This type of cancer can be detected and can often be prevented.  Routine colorectal cancer screening usually begins at  age 42 and continues through age 45.  If you have risk factors for colon cancer, your health care provider may recommend that you be screened at an earlier age.  If you have a family history of colorectal cancer, talk with your health care provider about genetic screening.  Your health care provider may also recommend using home test kits to check for hidden blood in your stool.  A small camera at the end of a tube can be used to examine your colon directly (sigmoidoscopy or colonoscopy). This is done to check for the earliest forms of colorectal cancer.  Direct examination of the colon should be repeated every  5-10 years until age 71. However, if early forms of precancerous polyps or small growths are found or if you have a family history or genetic risk for colorectal cancer, you may need to be screened more often.  Skin Cancer  Check your skin from head to toe regularly.  Monitor any moles. Be sure to tell your health care provider: ? About any new moles or changes in moles, especially if there is a change in a mole's shape or color. ? If you have a mole that is larger than the size of a pencil eraser.  If any of your family members has a history of skin cancer, especially at a young age, talk with your health care provider about genetic screening.  Always use sunscreen. Apply sunscreen liberally and repeatedly throughout the day.  Whenever you are outside, protect yourself by wearing long sleeves, pants, a wide-brimmed hat, and sunglasses.  What should I know about osteoporosis? Osteoporosis is a condition in which bone destruction happens more quickly than new bone creation. After menopause, you may be at an increased risk for osteoporosis. To help prevent osteoporosis or the bone fractures that can happen because of osteoporosis, the following is recommended:  If you are 46-71 years old, get at least 1,000 mg of calcium and at least 600 mg of vitamin D per day.  If you are older than age 55 but younger than age 65, get at least 1,200 mg of calcium and at least 600 mg of vitamin D per day.  If you are older than age 54, get at least 1,200 mg of calcium and at least 800 mg of vitamin D per day.  Smoking and excessive alcohol intake increase the risk of osteoporosis. Eat foods that are rich in calcium and vitamin D, and do weight-bearing exercises several times each week as directed by your health care provider. What should I know about how menopause affects my mental health? Depression may occur at any age, but it is more common as you become older. Common symptoms of depression  include:  Low or sad mood.  Changes in sleep patterns.  Changes in appetite or eating patterns.  Feeling an overall lack of motivation or enjoyment of activities that you previously enjoyed.  Frequent crying spells.  Talk with your health care provider if you think that you are experiencing depression. What should I know about immunizations? It is important that you get and maintain your immunizations. These include:  Tetanus, diphtheria, and pertussis (Tdap) booster vaccine.  Influenza every year before the flu season begins.  Pneumonia vaccine.  Shingles vaccine.  Your health care provider may also recommend other immunizations. This information is not intended to replace advice given to you by your health care provider. Make sure you discuss any questions you have with your health care provider. Document Released: 09/13/2005  Document Revised: 02/09/2016 Document Reviewed: 04/25/2015 Elsevier Interactive Patient Education  2018 [employer]  

## 2018-04-28 NOTE — Assessment & Plan Note (Signed)
CMET and Lipid profile today Encouraged her to consume a low fat diet If LDL not at goal, consider restarting Pravastatin

## 2018-04-29 ENCOUNTER — Encounter: Payer: Self-pay | Admitting: *Deleted

## 2018-05-08 ENCOUNTER — Other Ambulatory Visit: Payer: Self-pay

## 2018-05-08 ENCOUNTER — Inpatient Hospital Stay (HOSPITAL_BASED_OUTPATIENT_CLINIC_OR_DEPARTMENT_OTHER): Payer: Medicare Other | Admitting: Internal Medicine

## 2018-05-08 ENCOUNTER — Encounter: Payer: Self-pay | Admitting: Internal Medicine

## 2018-05-08 ENCOUNTER — Inpatient Hospital Stay: Payer: Medicare Other | Attending: Internal Medicine

## 2018-05-08 VITALS — BP 151/90 | HR 69 | Temp 97.6°F | Resp 18 | Ht 61.5 in | Wt 120.4 lb

## 2018-05-08 DIAGNOSIS — Z9221 Personal history of antineoplastic chemotherapy: Secondary | ICD-10-CM | POA: Diagnosis not present

## 2018-05-08 DIAGNOSIS — I10 Essential (primary) hypertension: Secondary | ICD-10-CM | POA: Diagnosis not present

## 2018-05-08 DIAGNOSIS — Z803 Family history of malignant neoplasm of breast: Secondary | ICD-10-CM

## 2018-05-08 DIAGNOSIS — Z90710 Acquired absence of both cervix and uterus: Secondary | ICD-10-CM | POA: Diagnosis not present

## 2018-05-08 DIAGNOSIS — M8580 Other specified disorders of bone density and structure, unspecified site: Secondary | ICD-10-CM | POA: Insufficient documentation

## 2018-05-08 DIAGNOSIS — Z79899 Other long term (current) drug therapy: Secondary | ICD-10-CM | POA: Diagnosis not present

## 2018-05-08 DIAGNOSIS — G629 Polyneuropathy, unspecified: Secondary | ICD-10-CM | POA: Insufficient documentation

## 2018-05-08 DIAGNOSIS — C50211 Malignant neoplasm of upper-inner quadrant of right female breast: Secondary | ICD-10-CM

## 2018-05-08 DIAGNOSIS — Z923 Personal history of irradiation: Secondary | ICD-10-CM | POA: Insufficient documentation

## 2018-05-08 DIAGNOSIS — Z170 Estrogen receptor positive status [ER+]: Secondary | ICD-10-CM | POA: Diagnosis not present

## 2018-05-08 DIAGNOSIS — D709 Neutropenia, unspecified: Secondary | ICD-10-CM | POA: Diagnosis not present

## 2018-05-08 DIAGNOSIS — Z79811 Long term (current) use of aromatase inhibitors: Secondary | ICD-10-CM

## 2018-05-08 LAB — CBC WITH DIFFERENTIAL/PLATELET
BASOS ABS: 0.1 10*3/uL (ref 0–0.1)
BASOS PCT: 2 %
Eosinophils Absolute: 0.1 10*3/uL (ref 0–0.7)
Eosinophils Relative: 4 %
HCT: 37 % (ref 35.0–47.0)
HEMOGLOBIN: 12.4 g/dL (ref 12.0–16.0)
Lymphocytes Relative: 40 %
Lymphs Abs: 1.2 10*3/uL (ref 1.0–3.6)
MCH: 30.2 pg (ref 26.0–34.0)
MCHC: 33.4 g/dL (ref 32.0–36.0)
MCV: 90.4 fL (ref 80.0–100.0)
MONOS PCT: 10 %
Monocytes Absolute: 0.3 10*3/uL (ref 0.2–0.9)
NEUTROS ABS: 1.3 10*3/uL — AB (ref 1.4–6.5)
NEUTROS PCT: 44 %
Platelets: 274 10*3/uL (ref 150–440)
RBC: 4.09 MIL/uL (ref 3.80–5.20)
RDW: 12.8 % (ref 11.5–14.5)
WBC: 3 10*3/uL — AB (ref 3.6–11.0)

## 2018-05-08 NOTE — Progress Notes (Signed)
[place] @ [facility] [phone]  [phone]   INITIAL CONSULT  Sandra [name] OB: Jan 07, 1946  MR#: [id number]  AOZ#:[id number]  Patient Care Team: Jearld Fenton, NP as PCP - General (Internal Medicine) Glennie Isle, PA-C (Physician Assistant) Pyrtle, Lajuan Lines, MD as Consulting Physician (Gastroenterology) Lucille Passy, MD as Consulting Physician (Family Medicine) Christene Lye, MD (General Surgery) Cammie Sickle, MD as Consulting Physician (Internal Medicine) Leona Singleton, RN as Oncology Nurse Navigator Noreene Filbert, MD as Referring Physician (Radiation Oncology)  CHIEF COMPLAINT:  Chief Complaint  Patient presents with  . Breast Cancer   VISIT DIAGNOSIS:     ICD-10-CM   1. Carcinoma of upper-inner quadrant of right breast in female, estrogen receptor positive ([place]) C50.211 MM DIAG BREAST TOMO BILATERAL   Z17.0 US Breast Limited Uni Left Inc Axilla    US Breast Limited Uni Right Inc Axilla     Oncology History    carcinoma breast (right) inner and upper quadrant T1 cN0 M0 tumor Estrogen receptor positive progesterone receptor positive HER-2 receptor negative by fish Mammo print shows high risk (diagnosis in January of 2016) patient has 22% average risk in 5 years and 29% average risk in 10 years for distant metastectomy disease without chemotherapy on anti-hormonal treatment 2.  Patient started Cytoxan and Adriamycin on now August 30, 2015 MUGA scan shows ejection fraction of baseline 55%.  3.Patient has finished total of 4 cycles of chemotherapy on November 01, 2015 4.  Started on the Taxol from November 22, 2015; FINISHED RT [sep 2017] # OCT 2017- START ARIMIDEX   # NOV 2017- BMD- OSTEOPENIA- ca+vit D  --------------------------------------------------------   DIAGNOSIS: RIGHT BREAST CA  STAGE: I        ;GOALS: cure  CURRENT/MOST RECENT THERAPY: on anastrazole        Carcinoma of upper-inner quadrant of  right breast in female, estrogen receptor positive ([place])     INTERVAL HISTORY: 72 year old [name] History of stage I right-sided breast cancer ER/PR positive currently on Arimidex is here for follow-up.  Patient overall doing well.  Denies any headaches.  Denies any nausea.  Denies any bone pain.  Continues to have mild tingling and numbness in her feet.  Awaiting to start gabapentin.   Review of Systems  Constitutional: Negative for chills, diaphoresis, fever, malaise/fatigue and weight loss.  HENT: Negative for nosebleeds and sore throat.   Eyes: Negative for double vision.  Respiratory: Negative for cough, hemoptysis, sputum production, shortness of breath and wheezing.   Cardiovascular: Negative for chest pain, palpitations, orthopnea and leg swelling.  Gastrointestinal: Negative for abdominal pain, blood in stool, constipation, diarrhea, heartburn, melena, nausea and vomiting.  Genitourinary: Negative for dysuria, frequency and urgency.  Musculoskeletal: Negative for back pain and joint pain.  Skin: Negative.  Negative for itching and rash.  Neurological: Negative for dizziness, tingling, focal weakness, weakness and headaches.  Endo/Heme/Allergies: Does not bruise/bleed easily.  Psychiatric/Behavioral: Negative for depression. The patient is not nervous/anxious and does not have insomnia.      PAST MEDICAL HISTORY: Past Medical History:  Diagnosis Date  . Breast cancer ([place]) 07/13/2015   RIGHT lumpectomy 07/13/15, pT1c, pN0,M0 Stage 1 Er/PR pos, her 2 neg. MammaPrint High  . Hyperlipidemia   . Hypertension   . Neuropathy   . 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 Personal history of chemotherapy 2016  . Personal history of radiation therapy 2016  . Visual blurriness  SOMETHING NEW THAT HAS DEVELOPED SINCE 07-13-15 SURGERY- SEEN AT [facility] AND EXAM WAS NORMAL PER PT (08-09-15)    PAST SURGICAL HISTORY: Past Surgical History:  Procedure Laterality  Date  . ABDOMINAL HYSTERECTOMY  1987  . BREAST BIOPSY Right    neg  . BREAST BIOPSY Left    4 oclock, FIBROEPITHELIAL LESION FAVOR CELLULAR FIBROADENOMA  . BREAST BIOPSY Left    2 oclock, CYSTIC APOCRINE METAPLASIA  . BREAST BIOPSY Right 07/13/15   130 INVASIVE MAMMARY CARCINOMA, NO SPECIAL TYPE  . BREAST LUMPECTOMY Left 07/13/2015  . BREAST LUMPECTOMY WITH SENTINEL LYMPH NODE BIOPSY Right 07/13/2015   Procedure: BREAST LUMPECTOMY WITH SENTINEL LYMPH NODE BX;  Surgeon: Christene Lye, MD;  Location: [facility] ORS;  Service: General;  Laterality: Right;  . BUNIONECTOMY Right 1993  . CATARACT EXTRACTION W/PHACO Left 09/17/2016   Procedure: CATARACT EXTRACTION PHACO AND INTRAOCULAR LENS PLACEMENT (IOC);  Surgeon: Birder Robson, MD;  Location: [facility] ORS;  Service: Ophthalmology;  Laterality: Left;  [place] 00:47AP% 17.6CDE 8.39Fluid pack lot # [id number]  . CATARACT EXTRACTION W/PHACO Right 01/21/2017   Procedure: CATARACT EXTRACTION PHACO AND INTRAOCULAR LENS PLACEMENT (IOC);  Surgeon: Birder Robson, MD;  Location: [facility] ORS;  Service: Ophthalmology;  Laterality: Right;  [place] 00:Sandra AP% 13.0 CDE 5.16 fluid pack lot # [id number]  . fatty tumor Left 2013   side  . PORT-A-CATH REMOVAL  2017  . PORTACATH PLACEMENT Left 08/16/2015   Procedure: INSERTION PORT-A-CATH;  Surgeon: Christene Lye, MD;  Location: [facility] ORS;  Service: General;  Laterality: Left;    FAMILY HISTORY Family History  Problem Relation Age of Onset  . Cancer Sister 18       breast cancer  . Breast cancer Sister 71  . Colon cancer Neg Hx         ADVANCED DIRECTIVES:   Patient does not have any living will or healthcare power of attorney.  Information was given .  Available resources had been discussed.  We will follow-up on subsequent appointments regarding this issue HEALTH MAINTENANCE: Social History   Tobacco Use  . Smoking status: Never Smoker  . Smokeless tobacco: Never Used  Substance Use Topics  . Alcohol  use: No    Alcohol/week: 0.0 standard drinks  . Drug use: No      :  Allergies  Allergen Reactions  . Cymbalta [Duloxetine Hcl] Other (See Comments)    Glaucoma.  Unable to take  . Lovastatin Anaphylaxis    Tongue swelling    Current Outpatient Medications  Medication Sig Dispense Refill  . amLODipine (NORVASC) 5 MG tablet TAKE 1 TABLET BY MOUTH ONCE DAILY 90 tablet 0  . anastrozole (ARIMIDEX) 1 MG tablet TAKE 1 TABLET BY MOUTH ONCE DAILY 90 tablet 1  . b complex vitamins tablet Take 1 tablet by mouth daily.    Marland Kitchen BIOTIN PO Take 5,000 mcg by mouth daily.    . Calcium Carbonate-Vitamin D (CALCIUM 500 + D PO) Take 2 tablets by mouth daily.    . COD LIVER OIL/VITAMINS A & D CAPS Take by mouth.    . Garlic 2671 MG CAPS Take by mouth.    Marland Kitchen lisinopril (PRINIVIL,ZESTRIL) 10 MG tablet TAKE ONE TABLET BY MOUTH ONCE DAILY 90 tablet 3  . Potassium 99 MG TABS Take 99 mg by mouth daily.     . Turmeric 500 MG CAPS Take 1 capsule by mouth daily.    Marland Kitchen gabapentin (NEURONTIN) 100 MG capsule Take 1 capsule (100  mg total) by mouth 2 (two) times daily. (Patient not taking: Reported on 05/08/2018) 60 capsule 2   No current facility-administered medications for this visit.     OBJECTIVE: Physical Exam  Constitutional: She is oriented to person, place, and time and well-developed, well-nourished, and in no distress.  HENT:  Head: Normocephalic and atraumatic.  Mouth/Throat: Oropharynx is clear and moist. No oropharyngeal exudate.  Eyes: Pupils are equal, round, and reactive to light.  Neck: Normal range of motion. Neck supple.  Cardiovascular: Normal rate and regular rhythm.  Pulmonary/Chest: No respiratory distress. She has no wheezes.  Abdominal: Soft. Bowel sounds are normal. She exhibits no distension and no mass. There is no tenderness. There is no rebound and no guarding.  Musculoskeletal: Normal range of motion. She exhibits no edema or tenderness.  Neurological: She is alert and oriented  to person, place, and time.  Skin: Skin is warm.  Right and left BREAST exam (in the presence of nurse)- no unusual skin changes or dominant masses felt. Surgical scars noted.    Psychiatric: Affect normal.      Vitals:   05/08/18 1417  BP: (!) 151/90  Pulse: 69  Resp: 18  Temp: 97.6 F (36.4 C)     Body mass index is 22.38 kg/m.    ECOG FS:0 - Asymptomatic  LAB RESULTS:  Appointment on 05/08/2018  Component Date Value Ref Range Status  . WBC 05/08/2018 3.0* 3.6 - 11.0 K/uL Final  . RBC 05/08/2018 4.09  3.80 - 5.20 MIL/uL Final  . Hemoglobin 05/08/2018 12.4  12.0 - 16.0 g/dL Final  . HCT 05/08/2018 37.0  35.0 - 47.0 % Final  . MCV 05/08/2018 90.4  80.0 - 100.0 fL Final  . MCH 05/08/2018 30.2  26.0 - 34.0 pg Final  . MCHC 05/08/2018 33.4  32.0 - 36.0 g/dL Final  . RDW 05/08/2018 12.8  11.5 - 14.5 % Final  . Platelets 05/08/2018 274  150 - 440 K/uL Final  . Neutrophils Relative % 05/08/2018 44  % Final  . Neutro Abs 05/08/2018 1.3* 1.4 - 6.5 K/uL Final  . Lymphocytes Relative 05/08/2018 40  % Final  . Lymphs Abs 05/08/2018 1.2  1.0 - 3.6 K/uL Final  . Monocytes Relative 05/08/2018 10  % Final  . Monocytes Absolute 05/08/2018 0.3  0.2 - 0.9 K/uL Final  . Eosinophils Relative 05/08/2018 4  % Final  . Eosinophils Absolute 05/08/2018 0.1  0 - 0.7 K/uL Final  . Basophils Relative 05/08/2018 2  % Final  . Basophils Absolute 05/08/2018 0.1  0 - 0.1 K/uL Final   Performed at [facility], [address] [place], [place] [address]     STUDIES: No results found.  ASSESSMENT:   Carcinoma of upper-inner quadrant of right breast in female, estrogen receptor positive ([place]) # Stage I ER/PR positive HER-2/neu negative;  On Anastrazole. STABLE.   # No clinical evidence of recurrence.  Continue anastrozole at this time.  Tolerating without any major side effects.   # Osteopenia- BMD Nov 2018; T score= -.2. Continue calcium and vitamin D.  Continue exercise at this time.   We will again get a bone density in fall 2020.  # Elevated Blood pressure- 150s/90s- recommend close monitoring at home.  And if elevated [greater than 140/90]-recheck to PCP.  #Chronic benign ethnic neutropenia /mild leucopenia-today 3.4/ overall improved; ANC- 1.8;  monitor for now.   # Grade 2 neuropathy- STABLE. s/p acupuncture-no significant improvement. Recommend trial of Neurontin 100  BID per PCP.    # follow up in 6 months/labs.   No matching staging information was found for the patient.  Cammie Sickle, MD   05/10/2018 4:57 PM

## 2018-05-08 NOTE — Assessment & Plan Note (Addendum)
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 STABLE.   # No clinical evidence of recurrence.  Continue anastrozole at this time.  Tolerating without any major side effects.   # Osteopenia- BMD Nov 2018; T score= -.2. Continue calcium and vitamin D.  Continue exercise at this time.  We will again get a bone density in fall 2020.  # Elevated Blood pressure- 150s/90s- recommend close monitoring at home.  And if elevated [greater than 140/90]-recheck to PCP.  #Chronic benign ethnic neutropenia /mild leucopenia-today 3.4/ overall improved; ANC- 1.8;  monitor for now.   # Grade 2 neuropathy- STABLE. s/p acupuncture-no significant improvement. Recommend trial of Neurontin 100 BID per PCP.    # follow up in 6 months/labs.

## 2018-05-13 ENCOUNTER — Ambulatory Visit: Payer: Medicare Other | Admitting: Radiation Oncology

## 2018-06-28 ENCOUNTER — Other Ambulatory Visit: Payer: Self-pay | Admitting: Internal Medicine

## 2018-06-28 DIAGNOSIS — Z170 Estrogen receptor positive status [ER+]: Principal | ICD-10-CM

## 2018-06-28 DIAGNOSIS — C50211 Malignant neoplasm of upper-inner quadrant of right female breast: Secondary | ICD-10-CM

## 2018-06-28 DIAGNOSIS — Z95828 Presence of other vascular implants and grafts: Secondary | ICD-10-CM

## 2018-06-30 ENCOUNTER — Telehealth: Payer: Self-pay | Admitting: Internal Medicine

## 2018-06-30 NOTE — Telephone Encounter (Signed)
Pt called office requesting a refill on the Amlodipine. Pt states the pharmacy sent over the request. Pt uses [facility] on [address].

## 2018-07-07 ENCOUNTER — Ambulatory Visit
Admission: RE | Admit: 2018-07-07 | Discharge: 2018-07-07 | Disposition: A | Discharge status: Home / Self Care | Payer: Medicare Other | Source: Ambulatory Visit | Attending: Internal Medicine | Admitting: Internal Medicine

## 2018-07-07 DIAGNOSIS — C50211 Malignant neoplasm of upper-inner quadrant of right female breast: Secondary | ICD-10-CM

## 2018-07-07 DIAGNOSIS — Z170 Estrogen receptor positive status [ER+]: Principal | ICD-10-CM

## 2018-07-16 ENCOUNTER — Other Ambulatory Visit: Payer: Self-pay

## 2018-07-16 ENCOUNTER — Encounter: Payer: Self-pay | Admitting: General Surgery

## 2018-07-16 ENCOUNTER — Ambulatory Visit: Payer: Medicare Other | Admitting: General Surgery

## 2018-07-16 VITALS — BP 144/87 | HR 80 | Temp 97.2°F | Resp 12 | Ht 62.5 in | Wt 121.0 lb

## 2018-07-16 DIAGNOSIS — C50211 Malignant neoplasm of upper-inner quadrant of right female breast: Secondary | ICD-10-CM | POA: Diagnosis not present

## 2018-07-16 DIAGNOSIS — Z170 Estrogen receptor positive status [ER+]: Secondary | ICD-10-CM

## 2018-07-16 NOTE — Progress Notes (Signed)
Patient ID: Sandra [name], female   DOB: 02-Mar-1946, 72 y.o.   MRN: [id number]  Chief Complaint  Patient presents with  . Follow-up    HPI Sandra [name] is a 72 y.o. female.  who presents for her follow up right breast cancer and a breast evaluation, former patient of Dr Jamal Collin. The most recent mammogram was done on 07-07-18 .  Patient does perform regular self breast checks and gets regular mammograms done.  No new breast issues. Occasional itching right breast.  Bone density was 07-17-17.  She is retired from [employer].  HPI  Past Medical History:  Diagnosis Date  . Breast cancer ([place]) 07/13/2015   RIGHT lumpectomy 07/13/15, pT1c, pN0,M0 Stage 1 Er/PR pos, her 2 neg. MammaPrint High  . Hyperlipidemia   . Hypertension   . Neuropathy   . 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 Personal history of chemotherapy 2016  . Personal history of radiation therapy 2016  . Visual blurriness    SOMETHING NEW THAT HAS DEVELOPED SINCE 07-13-15 SURGERY- SEEN AT [facility] AND EXAM WAS NORMAL PER PT (08-09-15)    Past Surgical History:  Procedure Laterality Date  . ABDOMINAL HYSTERECTOMY  1987  . BREAST BIOPSY Right    neg  . BREAST BIOPSY Left    4 oclock, FIBROEPITHELIAL LESION FAVOR CELLULAR FIBROADENOMA  . BREAST BIOPSY Left    2 oclock, CYSTIC APOCRINE METAPLASIA  . BREAST BIOPSY Right 07/13/15   130 INVASIVE MAMMARY CARCINOMA, NO SPECIAL TYPE  . BREAST EXCISIONAL BIOPSY Left 2016   fibroadenoma  . BREAST LUMPECTOMY Left 07/13/2015  . BREAST LUMPECTOMY WITH SENTINEL LYMPH NODE BIOPSY Right 07/13/2015   Procedure: BREAST LUMPECTOMY WITH SENTINEL LYMPH NODE BX;  Surgeon: Christene Lye, MD;  Location: [facility] ORS;  Service: General;  Laterality: Right;  . BUNIONECTOMY Right 1993  . CATARACT EXTRACTION W/PHACO Left 09/17/2016   Procedure: CATARACT EXTRACTION PHACO AND INTRAOCULAR LENS PLACEMENT (IOC);  Surgeon: Birder Robson,  MD;  Location: [facility] ORS;  Service: Ophthalmology;  Laterality: Left;  [place] 00:47AP% 17.6CDE 8.39Fluid pack lot # [id number]  . CATARACT EXTRACTION W/PHACO Right 01/21/2017   Procedure: CATARACT EXTRACTION PHACO AND INTRAOCULAR LENS PLACEMENT (IOC);  Surgeon: Birder Robson, MD;  Location: [facility] ORS;  Service: Ophthalmology;  Laterality: Right;  [place] 00:39 AP% 13.0 CDE 5.16 fluid pack lot # [id number] H  . COLONOSCOPY  2015  . fatty tumor Left 2013   side  . PORT-A-CATH REMOVAL  2017  . PORTACATH PLACEMENT Left 08/16/2015   Procedure: INSERTION PORT-A-CATH;  Surgeon: Christene Lye, MD;  Location: [facility] ORS;  Service: General;  Laterality: Left;    Family History  Problem Relation Age of Onset  . Cancer Sister 57       breast cancer  . Breast cancer Sister 86  . Colon cancer Neg Hx     Social History Social History   Tobacco Use  . Smoking status: Never Smoker  . Smokeless tobacco: Never Used  Substance Use Topics  . Alcohol use: No    Alcohol/week: 0.0 standard drinks  . Drug use: No    Allergies  Allergen Reactions  . Cymbalta [Duloxetine Hcl] Other (See Comments)    Glaucoma.  Unable to take  . Lovastatin Anaphylaxis    Tongue swelling    Current Outpatient Medications  Medication Sig Dispense Refill  . amLODipine (NORVASC) 5 MG tablet TAKE 1 TABLET BY MOUTH ONCE DAILY  90 tablet 1  . anastrozole (ARIMIDEX) 1 MG tablet TAKE 1 TABLET BY MOUTH ONCE DAILY 90 tablet 1  . b complex vitamins tablet Take 1 tablet by mouth daily.    Marland Kitchen BIOTIN PO Take 5,000 mcg by mouth daily.    . Calcium Carbonate-Vitamin D (CALCIUM 500 + D PO) Take 2 tablets by mouth daily.    . COD LIVER OIL/VITAMINS A & D CAPS Take by mouth.    . Flaxseed, Linseed, (FLAX SEEDS PO) Take by mouth daily.    . Garlic 7106 MG CAPS Take by mouth.    Marland Kitchen lisinopril (PRINIVIL,ZESTRIL) 10 MG tablet TAKE ONE TABLET BY MOUTH ONCE DAILY 90 tablet 3  . PAPAYA ENZYMES PO Take by mouth daily.    . Potassium 99 MG TABS  Take 99 mg by mouth daily.     . Turmeric 500 MG CAPS Take 1 capsule by mouth daily.     No current facility-administered medications for this visit.     Review of Systems Review of Systems  Constitutional: Negative.   Respiratory: Negative.   Cardiovascular: Negative.     Blood pressure (!) 144/87, pulse 80, temperature (!) 97.2 F (36.2 C), temperature source Skin, resp. rate 12, height 5' 2.5" (1.588 m), weight 121 lb (54.9 kg), SpO2 98 %.  Physical Exam Physical Exam Exam conducted with a chaperone present.  Constitutional:      Appearance: She is well-developed.  Eyes:     General: No scleral icterus.    Conjunctiva/sclera: Conjunctivae normal.  Neck:     Musculoskeletal: Neck supple.  Cardiovascular:     Rate and Rhythm: Normal rate and regular rhythm.     Heart sounds: Normal heart sounds.  Pulmonary:     Effort: Pulmonary effort is normal.     Breath sounds: Normal breath sounds.  Chest:     Breasts:        Right: No inverted nipple, mass, nipple discharge, skin change or tenderness.        Left: No inverted nipple, mass, nipple discharge, skin change or tenderness.     Comments: Right breast lumpectomy well healed scar. Lymphadenopathy:     Cervical: No cervical adenopathy.  Skin:    General: Skin is warm and dry.  Neurological:     Mental Status: She is alert and oriented to person, place, and time.  Psychiatric:        Behavior: Behavior normal.     Data Reviewed Bilateral diagnostic mammograms of July 07, 2018 reviewed.  BI-RADS-2.  Bone density of December 2018: Osteopenia. Assessment    No evidence of recurrent cancer.    Plan    The patient has been asked to return to the office in one year. The patient is aware to call back for any questions or new concerns.     Dr. Rogue Bussing has taken responsibility for ordering the patient's mammograms.  These will be reviewed at the time of her next visit.  HPI, Physical Exam, Assessment and Plan  have been scribed under the direction and in the presence of Robert Bellow, MD. Karie Fetch, RN  I have completed the exam and reviewed the above documentation for accuracy and completeness.  I agree with the above.  Haematologist has been used and any errors in dictation or transcription are unintentional.  Hervey Ard, M.D., F.A.C.S.  Forest Gleason Byrnett 07/18/2018, 9:33 AM

## 2018-07-16 NOTE — Patient Instructions (Addendum)
The patient is aware to call back for any questions or concerns. The patient has been asked to return to the office in one year. Marland Kitchen

## 2018-08-13 ENCOUNTER — Telehealth: Payer: Self-pay | Admitting: Internal Medicine

## 2018-08-13 ENCOUNTER — Other Ambulatory Visit: Payer: Self-pay | Admitting: Internal Medicine

## 2018-08-13 NOTE — Telephone Encounter (Signed)
Take 1 capsule (100 mg total) by mouth 2 (two) times daily. From 04/2018 with 3 refills, someone d/c the Rx

## 2018-08-13 NOTE — Telephone Encounter (Signed)
Pt stated the gabapentin 100mg  she is taking isn't strong enough. Pt want to know if you want to up her dosage. Please advise.

## 2018-08-13 NOTE — Telephone Encounter (Signed)
Don't see it on her list. Is she taking 100 mg TID? Next step would be 300 mg TID

## 2018-08-13 NOTE — Telephone Encounter (Signed)
Send in 300 mg caps, 1 tab PO TID #90, 2 refills

## 2018-08-14 ENCOUNTER — Telehealth: Payer: Self-pay | Admitting: *Deleted

## 2018-08-14 MED ORDER — GABAPENTIN 300 MG PO CAPS: 300.0000 mg | ORAL_CAPSULE | Freq: Three times a day (TID) | ORAL | 1 refills | Status: DC

## 2018-08-14 NOTE — Addendum Note (Signed)
Addended by: Lurlean Nanny on: 08/14/2018 11:26 AM   Modules accepted: Orders

## 2018-08-14 NOTE — Telephone Encounter (Signed)
Agree with advice given

## 2018-08-14 NOTE — Telephone Encounter (Signed)
Pt is agreeable to starting new dose... Rx sent through e-scribe and pt will update in a few weeks

## 2018-08-14 NOTE — Telephone Encounter (Signed)
Patient called stating that she just cut her finger on a food processor and can not get it to stop bleeding. Patient wanted to know if she could come to the office and have it checked and get a tetanus shot if she needs one?  Advised patient that we do not have any openings this afternoon and suggested that she go to an Urgent Care. Patient stated that she will gp to [facility] to get her finger checked out.  Webb Silversmith NP aware.

## 2018-08-31 ENCOUNTER — Telehealth: Payer: Self-pay

## 2018-08-31 NOTE — Telephone Encounter (Signed)
Pt was at [facility] today for 3 hrs to get FU on finger laceration that has healed but had hard area at site of laceration. Pt said PA took off some dry dead skin and she was OK. BP at UC was 155/84 after pt waiting 3 hrs to be seen.. pt is at home and checked BP 178/102 and then pt repositioned BP cuff and systolic was in 675'F and diastolic was lower.pt is not sure if her BP cuff is accurate. No H/A, dizziness, vision changes,CP ro SOB. Pt wanted to know if could get BP checked at [facility]; advised yes can schedule appt to be seen; pt scheduled appt on 09/01/18 at 8 AM for BP ck. Pt will be here 7:50 to get checked in. UC orED precautions given andpt voiced understanding.FYI to Avie Echevaria NP.

## 2018-08-31 NOTE — Telephone Encounter (Signed)
Will see at appt tomorrow.

## 2018-09-01 ENCOUNTER — Ambulatory Visit: Payer: Medicare Other | Admitting: Internal Medicine

## 2018-09-01 ENCOUNTER — Encounter: Payer: Self-pay | Admitting: Internal Medicine

## 2018-09-01 VITALS — BP 122/74 | HR 70 | Temp 98.2°F | Wt 125.0 lb

## 2018-09-01 DIAGNOSIS — H938X1 Other specified disorders of right ear: Secondary | ICD-10-CM | POA: Diagnosis not present

## 2018-09-01 DIAGNOSIS — I10 Essential (primary) hypertension: Secondary | ICD-10-CM

## 2018-09-01 NOTE — Progress Notes (Signed)
Subjective:    Patient ID: Sandra [name], female    DOB: Jul 08, 1946, 73 y.o.   MRN: [id number]  HPI  Pt presents to the clinic today for BP check. She went to [facility] for a finger laceration, BP was elevated at 155/84. She checked it when she got home with her cuff and it was 178/102. She is unsure if her home cuff is accurate. She would like to compare a manual reading with her cuff. Her BP today is 122/74. Her cuff reads 136/84. She denies headache, visual changes, chest pain or tightness, shortness of breath.  She is also requesting referral to ENT. She has static noise in her right ear. This has been an ongoing issue. She denies ear pain, drainage or decreased hearing, She has not tried anything OTC for her symptoms.  Review of Systems      Past Medical History:  Diagnosis Date  . 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 MammaPrint High  . Hyperlipidemia   . Hypertension   . Neuropathy   . 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 Personal history of chemotherapy 2016  . Personal history of radiation therapy 2016  . Visual blurriness    SOMETHING NEW THAT HAS DEVELOPED SINCE 07-13-15 SURGERY- SEEN AT [facility] ENT AND EXAM WAS NORMAL PER PT (08-09-15)    Current Outpatient Medications  Medication Sig Dispense Refill  . amLODipine (NORVASC) 5 MG tablet TAKE 1 TABLET BY MOUTH ONCE DAILY 90 tablet 1  . anastrozole (ARIMIDEX) 1 MG tablet TAKE 1 TABLET BY MOUTH ONCE DAILY 90 tablet 1  . b complex vitamins tablet Take 1 tablet by mouth daily.    Marland Kitchen BIOTIN PO Take 5,000 mcg by mouth daily.    . Calcium Carbonate-Vitamin D (CALCIUM 500 + D PO) Take 2 tablets by mouth daily.    . COD LIVER OIL/VITAMINS A & D CAPS Take by mouth.    . Flaxseed, Linseed, (FLAX SEEDS PO) Take by mouth daily.    Marland Kitchen gabapentin (NEURONTIN) 300 MG capsule Take 1 capsule (300 mg total) by mouth 3 (three) times daily. 90 capsule 1   . Garlic 9798 MG CAPS Take by mouth.    Marland Kitchen lisinopril (PRINIVIL,ZESTRIL) 10 MG tablet TAKE 1 TABLET BY MOUTH ONCE DAILY 90 tablet 0  . PAPAYA ENZYMES PO Take by mouth daily.    . Potassium 99 MG TABS Take 99 mg by mouth daily.     . Turmeric 500 MG CAPS Take 1 capsule by mouth daily.     No current facility-administered medications for this visit.     Allergies  Allergen Reactions  . Cymbalta [Duloxetine Hcl] Other (See Comments)    Glaucoma.  Unable to take  . Lovastatin Anaphylaxis    Tongue swelling    Family History  Problem Relation Age of Onset  . Cancer Sister 80       breast cancer  . Breast cancer Sister 22  . Colon cancer Neg Hx     Social History   Socioeconomic History  . Marital status: Married    Spouse name: Not on file  . Number of children: Not on file  . Years of education: Not on file  . Highest education level: Not on file  Occupational History  . Not on file  Social Needs  . Financial resource strain: Not on file  . Food insecurity:  Worry: Not on file    Inability: Not on file  . Transportation needs:    Medical: Not on file    Non-medical: Not on file  Tobacco Use  . Smoking status: Never Smoker  . Smokeless tobacco: Never Used  Substance and Sexual Activity  . Alcohol use: No    Alcohol/week: 0.0 standard drinks  . Drug use: No  . Sexual activity: Yes  Lifestyle  . Physical activity:    Days per week: Not on file    Minutes per session: Not on file  . Stress: Not on file  Relationships  . Social connections:    Talks on phone: Not on file    Gets together: Not on file    Attends religious service: Not on file    Active member of club or organization: Not on file    Attends meetings of clubs or organizations: Not on file    Relationship status: Not on file  . Intimate partner violence:    Fear of current or ex partner: Not on file    Emotionally abused: Not on file    Physically abused: Not on file    Forced sexual  activity: Not on file  Other Topics Concern  . Not on file  Social History Narrative   End of life wishes (updated 01/2013)-   Daughter of wishes (Cherlyl Duwayne Heck)      Desires CPR.   Desires life support if reasonable.           Constitutional: Denies fever, malaise, fatigue, headache or abrupt weight changes.  HEENT: Pt reports static noise in right ear. Denies eye pain, eye redness, ear pain, ringing in the ears, wax buildup, runny nose, nasal congestion, bloody nose, or sore throat. Respiratory: Denies difficulty breathing, shortness of breath, cough or sputum production.   Cardiovascular: Denies chest pain, chest tightness, palpitations or swelling in the hands or feet.  Neurological: Denies dizziness, difficulty with memory, difficulty with speech or problems with balance and coordination.    No other specific complaints in a complete review of systems (except as listed in HPI above).  Objective:   Physical Exam   BP 122/74   Pulse 70   Temp 98.2 F (36.8 C) (Oral)   Wt 125 lb (56.7 kg)   LMP  (LMP Unknown)   SpO2 98%   BMI 22.50 kg/m  Wt Readings from Last 3 Encounters:  09/01/18 125 lb (56.7 kg)  07/16/18 121 lb (54.9 kg)  05/08/18 120 lb 6.4 oz (54.6 kg)    General: Appears her stated age, well developed, well nourished in NAD. SHEENT: Head: normal shape and size; Right Ear: Unable to visualize TM, canal with wet wax or dry skin noted;  Cardiovascular: Normal rate and rhythm. S1,S2 noted.  No murmur, rubs or gallops noted.  Pulmonary/Chest: Normal effort and positive vesicular breath sounds. No respiratory distress. No wheezes, rales or ronchi noted.  Neurological: Alert and oriented.   BMET    Component Value Date/Time   NA 140 04/28/2018 0957   K 3.9 Hemolyzed 04/28/2018 0957   K 4.0 02/24/2012 0743   CL 105 04/28/2018 0957   CO2 29 04/28/2018 0957   GLUCOSE 98 04/28/2018 0957   BUN 14 04/28/2018 0957   CREATININE 0.87 04/28/2018 0957   CALCIUM  9.2 04/28/2018 0957   GFRNONAA >60 11/07/2017 1339   GFRAA >60 11/07/2017 1339    Lipid Panel     Component Value Date/Time   CHOL 164 04/28/2018  0957   TRIG 37.0 04/28/2018 0957   HDL 77.50 04/28/2018 0957   CHOLHDL 2 04/28/2018 0957   VLDL 7.4 04/28/2018 0957   LDLCALC 79 04/28/2018 0957    CBC    Component Value Date/Time   WBC 3.0 (L) 05/08/2018 1359   RBC 4.09 05/08/2018 1359   HGB 12.4 05/08/2018 1359   HGB 13.4 11/08/2014 1059   HCT 37.0 05/08/2018 1359   HCT 39.8 11/08/2014 1059   PLT 274 05/08/2018 1359   PLT 318 11/08/2014 1059   MCV 90.4 05/08/2018 1359   MCV 88 11/08/2014 1059   MCH 30.2 05/08/2018 1359   MCHC 33.4 05/08/2018 1359   RDW 12.8 05/08/2018 1359   RDW 12.7 11/08/2014 1059   LYMPHSABS 1.2 05/08/2018 1359   LYMPHSABS 1.3 11/08/2014 1059   MONOABS 0.3 05/08/2018 1359   MONOABS 0.3 11/08/2014 1059   EOSABS 0.1 05/08/2018 1359   EOSABS 0.2 11/08/2014 1059   BASOSABS 0.1 05/08/2018 1359   BASOSABS 0.0 11/08/2014 1059    Hgb A1C Lab Results  Component Value Date   HGBA1C 5.1 07/26/2015           Assessment & Plan:   Static Noise, Right Ear:  Refer to ENT for further evaluation  Return precautions discussed Webb Silversmith, NP

## 2018-09-01 NOTE — Assessment & Plan Note (Signed)
Controlled on Lisinopril and Amlodipine Encouraged regular physical activity Discussed low sodium diet Will monitor

## 2018-09-01 NOTE — Patient Instructions (Signed)

## 2018-09-16 ENCOUNTER — Emergency Department
Admission: EM | Admit: 2018-09-16 | Discharge: 2018-09-16 | Disposition: A | Discharge status: Home / Self Care | Payer: Medicare Other | Attending: Emergency Medicine | Admitting: Emergency Medicine

## 2018-09-16 ENCOUNTER — Other Ambulatory Visit: Payer: Self-pay

## 2018-09-16 ENCOUNTER — Emergency Department: Payer: Medicare Other

## 2018-09-16 DIAGNOSIS — Z853 Personal history of malignant neoplasm of breast: Secondary | ICD-10-CM | POA: Diagnosis not present

## 2018-09-16 DIAGNOSIS — R079 Chest pain, unspecified: Secondary | ICD-10-CM | POA: Insufficient documentation

## 2018-09-16 DIAGNOSIS — I10 Essential (primary) hypertension: Secondary | ICD-10-CM | POA: Insufficient documentation

## 2018-09-16 LAB — CBC
HCT: 37.7 % (ref 36.0–46.0)
Hemoglobin: 12.3 g/dL (ref 12.0–15.0)
MCH: 29.7 pg (ref 26.0–34.0)
MCHC: 32.6 g/dL (ref 30.0–36.0)
MCV: 91.1 fL (ref 80.0–100.0)
PLATELETS: 294 10*3/uL (ref 150–400)
RBC: 4.14 MIL/uL (ref 3.87–5.11)
RDW: 12.2 % (ref 11.5–15.5)
WBC: 3.6 10*3/uL — AB (ref 4.0–10.5)
nRBC: 0 % (ref 0.0–0.2)

## 2018-09-16 LAB — BASIC METABOLIC PANEL
Anion gap: 7 (ref 5–15)
BUN: 15 mg/dL (ref 8–23)
CALCIUM: 9 mg/dL (ref 8.9–10.3)
CO2: 27 mmol/L (ref 22–32)
Chloride: 106 mmol/L (ref 98–111)
Creatinine, Ser: 0.81 mg/dL (ref 0.44–1.00)
GFR calc Af Amer: >60 mL/min (ref >60)
Glucose, Bld: 104 mg/dL — ABNORMAL HIGH (ref 70–99)
Potassium: 3.7 mmol/L (ref 3.5–5.1)
Sodium: 140 mmol/L (ref 135–145)

## 2018-09-16 LAB — TROPONIN I: Troponin I: <0.03 ng/mL (ref <0.03)

## 2018-09-16 MED ORDER — ASPIRIN 81 MG PO CHEW
324.0000 mg | CHEWABLE_TABLET | Freq: Once | ORAL | Status: AC
  Administered 2018-09-16: 324 mg via ORAL
  Filled 2018-09-16: qty 4

## 2018-09-16 MED ORDER — SODIUM CHLORIDE 0.9% FLUSH: 3.0000 mL | Freq: Once | INTRAVENOUS | Status: DC

## 2018-09-16 NOTE — ED Triage Notes (Signed)
Pt arrives to ED via POV from home with c/o non-radiating left-sided chest pain x1 day. No accompanying s/x's; pt denies N/V, no dizziness, lightheadedness, or diaphoresis. No h/x of cardiac issues, no SHOB. Pt decribes the pain as "light pain", but states it does not feel like tightness or pressure.

## 2018-09-16 NOTE — ED Notes (Signed)
Pt verbalized understanding of d/c instructions, and f/u care, no further questions at this time. Pt ambulatory to the exit with steady gait accompanied by her husband.

## 2018-09-16 NOTE — Discharge Instructions (Addendum)
Please make a follow-up appointment with the cardiologist on-call for further evaluation.  The cardiologist may recommend a stress test for you.  Return to the emergency department if you develop severe pain, lightheadedness or fainting, nausea or vomiting, sweaty or clammy feeling, palpitations, or any other symptoms concerning to you.

## 2018-09-16 NOTE — ED Provider Notes (Signed)
[facility] Emergency Department Provider Note  ____________________________________________  Time seen: Approximately 11:18 PM  I have reviewed the triage vital signs and the nursing notes.   HISTORY  Chief Complaint Chest Pain    HPI Sandra [name] is a 73 y.o. female with a history of HTN presenting with chest pain.  The patient reports that earlier today, she was sitting down when she developed a very mild dull left-sided chest pain that has come and gone.  It is not associated with shortness of breath, diaphoresis, nausea or vomiting, palpitations, lightheadedness or syncope.  She reports she did go to the gym this morning and did Pilates, but did not have any unusual strain in her chest muscles.  This is not associated with food and she does not have an acidic taste in her mouth or burping.  She has never experienced this pain before.  Exertion and deep breaths do not make it worse.  She has not had any cough or cold symptoms.  The patient has never undergone a stress test or risk stratification study.  She has not tried anything for symptoms.  SH: Denies tobacco or cocaine.  Past Medical History:  Diagnosis Date  . Breast cancer ([place]) 07/13/2015   RIGHT lumpectomy 07/13/15, pT1c, pN0,M0 Stage 1 Er/PR pos, her 2 neg. MammaPrint High  . Hyperlipidemia   . Hypertension   . Neuropathy   . 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 Personal history of chemotherapy 2016  . Personal history of radiation therapy 2016  . Visual blurriness    SOMETHING NEW THAT HAS DEVELOPED SINCE 07-13-15 SURGERY- SEEN AT [facility] ENT AND EXAM WAS NORMAL PER PT (08-09-15)    Patient Active Problem List   Diagnosis Date Noted  . Carcinoma of upper-inner quadrant of right breast in female, estrogen receptor positive ([place]) 01/31/2016  . Neuropathy due to chemotherapeutic drug ([place]) 01/17/2016  . Hypertension   . Hyperlipidemia     Past Surgical  History:  Procedure Laterality Date  . ABDOMINAL HYSTERECTOMY  1987  . BREAST BIOPSY Right    neg  . BREAST BIOPSY Left    4 oclock, FIBROEPITHELIAL LESION FAVOR CELLULAR FIBROADENOMA  . BREAST BIOPSY Left    2 oclock, CYSTIC APOCRINE METAPLASIA  . BREAST BIOPSY Right 07/13/15   130 INVASIVE MAMMARY CARCINOMA, NO SPECIAL TYPE  . BREAST EXCISIONAL BIOPSY Left 2016   fibroadenoma  . BREAST LUMPECTOMY Left 07/13/2015  . BREAST LUMPECTOMY WITH SENTINEL LYMPH NODE BIOPSY Right 07/13/2015   Procedure: BREAST LUMPECTOMY WITH SENTINEL LYMPH NODE BX;  Surgeon: Christene Lye, MD;  Location: [facility] ORS;  Service: General;  Laterality: Right;  . BUNIONECTOMY Right 1993  . CATARACT EXTRACTION W/PHACO Left 09/17/2016   Procedure: CATARACT EXTRACTION PHACO AND INTRAOCULAR LENS PLACEMENT (IOC);  Surgeon: Birder Robson, MD;  Location: [facility] ORS;  Service: Ophthalmology;  Laterality: Left;  [place] 00:47AP% 17.6CDE 8.39Fluid pack lot # [id number]  . CATARACT EXTRACTION W/PHACO Right 01/21/2017   Procedure: CATARACT EXTRACTION PHACO AND INTRAOCULAR LENS PLACEMENT (IOC);  Surgeon: Birder Robson, MD;  Location: [facility] ORS;  Service: Ophthalmology;  Laterality: Right;  [place] 00:39 AP% 13.0 CDE 5.16 fluid pack lot # [id number]  . COLONOSCOPY  2015  . fatty tumor Left 2013   side  . PORT-A-CATH REMOVAL  2017  . PORTACATH PLACEMENT Left 08/16/2015   Procedure: INSERTION PORT-A-CATH;  Surgeon: Christene Lye, MD;  Location: [facility] ORS;  Service: General;  Laterality: Left;  Current Outpatient Rx  . Order #: [id number] Class: Normal  . Order #: [id number] Class: Normal  . Order #: [id number] Class: Historical Med  . Order #: [id number] Class: Historical Med  . Order #: [id number] Class: Historical Med  . Order #: [id number] Class: Historical Med  . Order #: [id number] Class: Historical Med  . Order #: [id number] Class: Normal  . Order #: [id number] Class: Historical Med  . Order #: [id number] Class: Normal  . Order #:  [id number] Class: Historical Med  . Order #: [id number] Class: Historical Med  . Order #: [id number] Class: Historical Med    Allergies Cymbalta [duloxetine hcl] and Lovastatin  Family History  Problem Relation Age of Onset  . Cancer Sister 74       breast cancer  . Breast cancer Sister 34  . Colon cancer Neg Hx     Social History Social History   Tobacco Use  . Smoking status: Never Smoker  . Smokeless tobacco: Never Used  Substance Use Topics  . Alcohol use: No    Alcohol/week: 0.0 standard drinks  . Drug use: No    Review of Systems Constitutional: No fever/chills. Eyes: No visual changes. ENT: No sore throat. No congestion or rhinorrhea. Cardiovascular: Positive chest pain. Denies palpitations. Respiratory: Denies shortness of breath.  No cough. Gastrointestinal: No abdominal pain.  No nausea, no vomiting.  No diarrhea.  No constipation. Genitourinary: Negative for dysuria. Musculoskeletal: Negative for back pain. Skin: Negative for rash. Neurological: Negative for headaches. No focal numbness, tingling or weakness.     ____________________________________________   PHYSICAL EXAM:  VITAL SIGNS: ED Triage Vitals  Enc Vitals Group     BP 09/16/18 1956 (!) 152/68     Pulse Rate 09/16/18 1956 84     Resp 09/16/18 1956 16     Temp 09/16/18 1956 98.3 F (36.8 C)     Temp Source 09/16/18 1956 Oral     SpO2 09/16/18 1956 99 %     Weight --      Height --      Head Circumference --      Peak Flow --      Pain Score 09/16/18 2016 2     Pain Loc --      Pain Edu? --      Excl. in [place]? --     Constitutional: Alert and oriented. Answers questions appropriately. Eyes: Conjunctivae are normal.  EOMI. No scleral icterus. Head: Atraumatic. Nose: No congestion/rhinnorhea. Mouth/Throat: Mucous membranes are moist.  Neck: No stridor.  Supple.  No JVD.  No meningismus. Cardiovascular: Normal rate, regular rhythm. No murmurs, rubs or gallops.  The overlying skin is  normal and I am unable to reproduce her pain with palpation over the chest. Respiratory: Normal respiratory effort.  No accessory muscle use or retractions. Lungs CTAB.  No wheezes, rales or ronchi. Gastrointestinal: Soft, nontender and nondistended.  No guarding or rebound.  No peritoneal signs. Musculoskeletal: No LE edema. No ttp in the calves or palpable cords.  Negative Homan's sign. Neurologic:  A&Ox3.  Speech is clear.  Face and smile are symmetric.  EOMI.  Moves all extremities well. Skin:  Skin is warm, dry and intact. No rash noted. Psychiatric: Mood and affect are normal. Speech and behavior are normal.  Normal judgement  ____________________________________________   LABS (all labs ordered are listed, but only abnormal results are displayed)  Labs Reviewed  BASIC METABOLIC PANEL - Abnormal; Notable for the following components:      Result Value   Glucose, Bld 104 (*)  All other components within normal limits  CBC - Abnormal; Notable for the following components:   WBC 3.6 (*)    All other components within normal limits  TROPONIN I  TROPONIN I   ____________________________________________  EKG  ED ECG REPORT I, Anne-Caroline Mariea Clonts, the attending physician, personally viewed and interpreted this ECG.   Date: 09/16/2018  EKG Time: 2026  Rate: 78  Rhythm: normal sinus rhythm  Axis: normal  Intervals:none  ST&T Change: No STEMI  ____________________________________________  RADIOLOGY  Dg Chest 2 View  Result Date: 09/16/2018 CLINICAL DATA:  73 year old female with a history of left-sided chest pain EXAM: CHEST - 2 VIEW COMPARISON:  08/16/2015 FINDINGS: Cardiomediastinal silhouette unchanged in size and contour. Interval removal of left IJ port catheter. No pneumothorax or pleural effusion. Coarsened interstitial markings. No confluent airspace disease. No displaced fracture. IMPRESSION: Negative for acute cardiopulmonary disease. Electronically Signed   By:  Corrie Mckusick D.O.   On: 09/16/2018 20:34    ____________________________________________   PROCEDURES  Procedure(s) performed: None  Procedures  Critical Care performed: No ____________________________________________   INITIAL IMPRESSION / ASSESSMENT AND PLAN / ED COURSE  Pertinent labs & imaging results that were available during my care of the patient were reviewed by me and considered in my medical decision making (see chart for details).  73 y.o. female with atypical chest pain without any other red flag symptoms.  Overall, the patient is hemodynamically stable.  She has no acute findings on my cardiopulmonary examination and her work-up in the emergency department has been reassuring.  She has had an EKG which does not show any ischemic changes or arrhythmia.  Her troponin has been negative twice.  The cause of the patient's symptoms is unclear however there is no evidence for ACS or MI.  I do not suspect PE or aortic pathology.  The patient symptoms are not consistent with a GI cause.  At this time, the patient is safe for discharge home.  I have given her follow-up instructions as well as return precautions.  ____________________________________________  FINAL CLINICAL IMPRESSION(S) / ED DIAGNOSES  Final diagnoses:  Chest pain, unspecified type         NEW MEDICATIONS STARTED DURING THIS VISIT:  New Prescriptions   No medications on file      Eula Listen, MD 09/16/18 2322

## 2018-10-12 ENCOUNTER — Other Ambulatory Visit: Payer: Self-pay | Admitting: Internal Medicine

## 2018-11-05 ENCOUNTER — Other Ambulatory Visit: Payer: Self-pay

## 2018-11-05 DIAGNOSIS — C50211 Malignant neoplasm of upper-inner quadrant of right female breast: Secondary | ICD-10-CM

## 2018-11-05 DIAGNOSIS — Z170 Estrogen receptor positive status [ER+]: Principal | ICD-10-CM

## 2018-11-06 ENCOUNTER — Inpatient Hospital Stay: Payer: Medicare Other | Attending: Internal Medicine

## 2018-11-06 ENCOUNTER — Ambulatory Visit: Payer: Medicare Other | Admitting: Internal Medicine

## 2018-11-06 ENCOUNTER — Other Ambulatory Visit: Payer: Self-pay

## 2018-11-06 DIAGNOSIS — G629 Polyneuropathy, unspecified: Secondary | ICD-10-CM | POA: Insufficient documentation

## 2018-11-06 DIAGNOSIS — Z170 Estrogen receptor positive status [ER+]: Secondary | ICD-10-CM | POA: Insufficient documentation

## 2018-11-06 DIAGNOSIS — C50211 Malignant neoplasm of upper-inner quadrant of right female breast: Secondary | ICD-10-CM | POA: Insufficient documentation

## 2018-11-06 DIAGNOSIS — M8580 Other specified disorders of bone density and structure, unspecified site: Secondary | ICD-10-CM | POA: Diagnosis not present

## 2018-11-06 DIAGNOSIS — Z79811 Long term (current) use of aromatase inhibitors: Secondary | ICD-10-CM | POA: Diagnosis not present

## 2018-11-06 LAB — CBC WITH DIFFERENTIAL/PLATELET
Abs Immature Granulocytes: 0 10*3/uL (ref 0.00–0.07)
Basophils Absolute: 0.1 10*3/uL (ref 0.0–0.1)
Basophils Relative: 2 %
Eosinophils Absolute: 0.2 10*3/uL (ref 0.0–0.5)
Eosinophils Relative: 6 %
HCT: 36.5 % (ref 36.0–46.0)
Hemoglobin: 12.2 g/dL (ref 12.0–15.0)
Immature Granulocytes: 0 %
Lymphocytes Relative: 42 %
Lymphs Abs: 1.4 10*3/uL (ref 0.7–4.0)
MCH: 29.8 pg (ref 26.0–34.0)
MCHC: 33.4 g/dL (ref 30.0–36.0)
MCV: 89.2 fL (ref 80.0–100.0)
Monocytes Absolute: 0.2 10*3/uL (ref 0.1–1.0)
Monocytes Relative: 7 %
Neutro Abs: 1.4 10*3/uL — ABNORMAL LOW (ref 1.7–7.7)
Neutrophils Relative %: 43 %
Platelets: 279 10*3/uL (ref 150–400)
RBC: 4.09 MIL/uL (ref 3.87–5.11)
RDW: 11.9 % (ref 11.5–15.5)
WBC: 3.3 10*3/uL — ABNORMAL LOW (ref 4.0–10.5)
nRBC: 0 % (ref 0.0–0.2)

## 2018-11-06 LAB — BASIC METABOLIC PANEL
Anion gap: 7 (ref 5–15)
BUN: 19 mg/dL (ref 8–23)
CO2: 25 mmol/L (ref 22–32)
Calcium: 9 mg/dL (ref 8.9–10.3)
Chloride: 105 mmol/L (ref 98–111)
Creatinine, Ser: 0.93 mg/dL (ref 0.44–1.00)
GFR calc Af Amer: >60 mL/min (ref >60)
GFR calc non Af Amer: >60 mL/min (ref >60)
Glucose, Bld: 97 mg/dL (ref 70–99)
Potassium: 3.7 mmol/L (ref 3.5–5.1)
Sodium: 137 mmol/L (ref 135–145)

## 2018-11-09 ENCOUNTER — Other Ambulatory Visit: Payer: Self-pay

## 2018-11-09 ENCOUNTER — Inpatient Hospital Stay (HOSPITAL_BASED_OUTPATIENT_CLINIC_OR_DEPARTMENT_OTHER): Payer: Medicare Other | Admitting: Internal Medicine

## 2018-11-09 DIAGNOSIS — Z79811 Long term (current) use of aromatase inhibitors: Secondary | ICD-10-CM | POA: Diagnosis not present

## 2018-11-09 DIAGNOSIS — Z170 Estrogen receptor positive status [ER+]: Secondary | ICD-10-CM

## 2018-11-09 DIAGNOSIS — D709 Neutropenia, unspecified: Secondary | ICD-10-CM

## 2018-11-09 DIAGNOSIS — G629 Polyneuropathy, unspecified: Secondary | ICD-10-CM

## 2018-11-09 DIAGNOSIS — M8580 Other specified disorders of bone density and structure, unspecified site: Secondary | ICD-10-CM

## 2018-11-09 DIAGNOSIS — C50211 Malignant neoplasm of upper-inner quadrant of right female breast: Secondary | ICD-10-CM | POA: Diagnosis not present

## 2018-11-09 DIAGNOSIS — R030 Elevated blood-pressure reading, without diagnosis of hypertension: Secondary | ICD-10-CM

## 2018-11-09 NOTE — Progress Notes (Signed)
I connected with '@NAME'$  @ on 11/09/18 at  2:15 PM EDTby telephone and verified that I am speaking with the patient using 2 identifiers.  # LOCATION:  Patient: Home Provider: Office  I discussed the limitations, risks, security and privacy concerns of performing an evaluation and management service by telephone and the availability of in person appointments.  I also discussed with the patient that there may be a patient responsible charge related to the service.  The patient expressed understanding and agrees to proceed.  History of present illness:Sandra [name] 73 y.o.  female with history of stage I ER PR positive HER-2/neu negative breast cancer.   Patient denies any unusual joint pains or back pain or bone pain.  Appetite is good but no weight loss.  No headaches.  Observation/objective: labs-white count is 3.3 otherwise hemoglobin stable platelets normal; chemistries normal.  Assessment and plan: Carcinoma of upper-inner quadrant of right breast in female, estrogen receptor positive ([place]) # Stage I ER/PR positive HER-2/neu negative;  On Anastrazole. STABLE.   # No clinical evidence of recurrence.  Continue anastrozole at this time.  Tolerating without any major side effects.   # Osteopenia- BMD Nov 2018; T score= -.2. Continue calcium and vitamin D. STABLE. Will order BMD in fall-winter of 2020.   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   # Grade 2 neuropathy- STABLE; Neurontin 300 mg TID.   # DISPOSITION:  # follow up in 4 months-MD/labs- cbc/cmp-Dr.B.     Follow-up instructions:  I discussed the assessment and treatment plan with the patient.  The patient was provided an opportunity to ask questions and all were answered.  The patient agreed with the plan and demonstrated understanding of instructions.  The patient was advised to call back or seek an  in person evaluation if the symptoms worsen or if the condition fails to improve as anticipated.  I provided  6 minutes of non-face-to-face time during this encounter   Dr. Charlaine Dalton [facility] at [facility] 11/09/2018 11:47 AM

## 2018-11-09 NOTE — Assessment & Plan Note (Addendum)
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 STABLE.   # No clinical evidence of recurrence.  Continue anastrozole at this time.  Tolerating without any major side effects.   # Osteopenia- BMD Nov 2018; T score= -.2. Continue calcium and vitamin D. STABLE. Will order BMD in fall-winter of 2020.   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   # Grade 2 neuropathy- STABLE; Neurontin 300 mg TID.   # DISPOSITION:  # follow up in 4 months-MD/labs- cbc/cmp-Dr.B.

## 2018-11-10 ENCOUNTER — Other Ambulatory Visit: Payer: Self-pay | Admitting: Internal Medicine

## 2018-12-24 ENCOUNTER — Other Ambulatory Visit: Payer: Self-pay | Admitting: Internal Medicine

## 2018-12-24 DIAGNOSIS — Z95828 Presence of other vascular implants and grafts: Secondary | ICD-10-CM

## 2018-12-24 DIAGNOSIS — C50211 Malignant neoplasm of upper-inner quadrant of right female breast: Secondary | ICD-10-CM

## 2019-01-08 ENCOUNTER — Other Ambulatory Visit: Payer: Self-pay | Admitting: Internal Medicine

## 2019-02-07 ENCOUNTER — Other Ambulatory Visit: Payer: Self-pay | Admitting: Internal Medicine

## 2019-02-19 ENCOUNTER — Encounter: Payer: Self-pay | Admitting: Internal Medicine

## 2019-02-19 ENCOUNTER — Ambulatory Visit: Payer: Medicare Other | Admitting: Internal Medicine

## 2019-02-19 ENCOUNTER — Telehealth: Payer: Self-pay

## 2019-02-19 ENCOUNTER — Other Ambulatory Visit: Payer: Self-pay

## 2019-02-19 VITALS — BP 118/70 | HR 77 | Temp 98.3°F | Ht 62.5 in | Wt 123.0 lb

## 2019-02-19 DIAGNOSIS — M79662 Pain in left lower leg: Secondary | ICD-10-CM

## 2019-02-19 DIAGNOSIS — H93291 Other abnormal auditory perceptions, right ear: Secondary | ICD-10-CM

## 2019-02-19 DIAGNOSIS — H60541 Acute eczematoid otitis externa, right ear: Secondary | ICD-10-CM | POA: Diagnosis not present

## 2019-02-19 DIAGNOSIS — R252 Cramp and spasm: Secondary | ICD-10-CM

## 2019-02-19 MED ORDER — NEOMYCIN-POLYMYXIN-HC 3.5-10000-1 OT SOLN: 3.0000 [drp] | Freq: Four times a day (QID) | OTIC | 0 refills | Status: DC

## 2019-02-19 NOTE — Telephone Encounter (Signed)
[place] Night - Client Nonclinical Telephone Record AccessNurse Client [facility] Night - Client Client Site [place] Physician Webb Silversmith - NP Contact Type Call Who Is Calling Patient / Member / Family / Caregiver Caller Name Sandra [name] Caller Phone Number [phone] Reason for Call Symptomatic / Request for Sandra [name] states having cramps in left lower leg yesterday; has neuropathy in feet and hands d/t chemo; caller states calling to schedule appt per nurse spoke w about 20 mins ago. Adv to call after 8am to schedule appt; Call Closed By: Jerrye Beavers Transaction Date/Time: 02/19/2019 7:33:53 AM (ET)

## 2019-02-19 NOTE — Progress Notes (Signed)
Subjective:    Patient ID: Sandra [name], female    DOB: Sep 29, 1945, 73 y.o.   MRN: [id number]  HPI  Patient presents to the clinic today for complaints of left leg cramping.  This started yesterday.  It is aggravated by movement but the pain is constant.  She has tried massage and Tylenol with minimal relief.  She started walking approximately 30 minutes per day x 1 month.  She complains of numbness and tingling in bilateral feet but this is usual for her.  She denies any muscle cramping elsewhere.  She denies SOB, CP, palpitations.  She drinks plenty of water.  She also complains of "hearing noises" in her right ear x 1 week.  She has had to have her right ear flushed out a few times before due to increase in wax build up.  The ENT told her she could use a mixture of vinegar and water to flush them at home.  She has tried that without any improvement.   Review of Systems  Past Medical History:  Diagnosis Date  . Breast cancer ([place]) 07/13/2015   RIGHT lumpectomy 07/13/15, pT1c, pN0,M0 Stage 1 Er/PR pos, her 2 neg. MammaPrint High  . Hyperlipidemia   . Hypertension   . Neuropathy   . 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 Personal history of chemotherapy 2016  . Personal history of radiation therapy 2016  . Visual blurriness    SOMETHING NEW THAT HAS DEVELOPED SINCE 07-13-15 SURGERY- SEEN AT [facility] AND EXAM WAS NORMAL PER PT (08-09-15)    Current Outpatient Medications  Medication Sig Dispense Refill  . amLODipine (NORVASC) 5 MG tablet Take 1 tablet by mouth once daily 90 tablet 0  . anastrozole (ARIMIDEX) 1 MG tablet Take 1 tablet by mouth once daily 90 tablet 0  . b complex vitamins tablet Take 1 tablet by mouth daily.    Marland Kitchen BIOTIN PO Take 5,000 mcg by mouth daily.    . Calcium Carbonate-Vitamin D (CALCIUM 500 + D PO) Take 2 tablets by mouth daily.    . COD LIVER OIL/VITAMINS A & D CAPS Take by mouth.    . Flaxseed, Linseed, (FLAX SEEDS PO)  Take by mouth daily.    Marland Kitchen gabapentin (NEURONTIN) 300 MG capsule TAKE 1 CAPSULE BY MOUTH THREE TIMES DAILY 90 capsule 1  . Garlic 5456 MG CAPS Take by mouth.    Marland Kitchen lisinopril (ZESTRIL) 10 MG tablet Take 1 tablet by mouth once daily 90 tablet 0  . PAPAYA ENZYMES PO Take by mouth daily.    . Potassium 99 MG TABS Take 99 mg by mouth daily.     . Turmeric 500 MG CAPS Take 1 capsule by mouth daily.     No current facility-administered medications for this visit.     Allergies  Allergen Reactions  . Cymbalta [Duloxetine Hcl] Other (See Comments)    Glaucoma.  Unable to take  . Lovastatin Anaphylaxis    Tongue swelling    Family History  Problem Relation Age of Onset  . Cancer Sister 71       breast cancer  . Breast cancer Sister 30  . Colon cancer Neg Hx     Social History   Socioeconomic History  . Marital status: Married    Spouse name: Not on file  . Number of children: Not on file  . Years of education: Not on file  . Highest education level: Not on file  Occupational History  . Not on file  Social Needs  . Financial resource strain: Not on file  . Food insecurity    Worry: Not on file    Inability: Not on file  . Transportation needs    Medical: Not on file    Non-medical: Not on file  Tobacco Use  . Smoking status: Never Smoker  . Smokeless tobacco: Never Used  Substance and Sexual Activity  . Alcohol use: No    Alcohol/week: 0.0 standard drinks  . Drug use: No  . Sexual activity: Yes  Lifestyle  . Physical activity    Days per week: Not on file    Minutes per session: Not on file  . Stress: Not on file  Relationships  . Social Herbalist on phone: Not on file    Gets together: Not on file    Attends religious service: Not on file    Active member of club or organization: Not on file    Attends meetings of clubs or organizations: Not on file    Relationship status: Not on file  . Intimate partner violence    Fear of current or ex partner: Not  on file    Emotionally abused: Not on file    Physically abused: Not on file    Forced sexual activity: Not on file  Other Topics Concern  . Not on file  Social History Narrative   End of life wishes (updated 01/2013)-   Daughter of wishes (Cherlyl Duwayne Heck)      Desires CPR.   Desires life support if reasonable.           Constitutional: Denies fever, malaise, fatigue, headache or abrupt weight changes.  HEENT: Complains of "hearing noises" in her right ear and a history of wax buildup in the past. Denies ringing in the ears, runny nose, nasal congestion, bloody nose, or sore throat. Respiratory: Denies difficulty breathing, shortness of breath, cough or sputum production.   Cardiovascular: Denies chest pain, chest tightness, palpitations or swelling in the hands or feet.  Musculoskeletal: Complains of cramping in left lower calf. Denies decrease in range of motion, difficulty with gait, and swelling.  Skin: Denies redness, rashes, lesions or ulcercations.  Neurological: Complains of numbness/tingling to her BLE.  Denies dizziness, difficulty with memory, difficulty with speech or problems with balance and coordination.    No other specific complaints in a complete review of systems (except as listed in HPI above).     Objective:   Physical Exam  BP 118/70 (BP Location: Left Arm, Patient Position: Sitting, Cuff Size: Normal)   Pulse 77   Temp 98.3 F (36.8 C) (Temporal)   Ht 5' 2.5" (1.588 m)   Wt 123 lb (55.8 kg)   LMP  (LMP Unknown)   SpO2 100%   BMI 22.14 kg/m    Wt Readings from Last 3 Encounters:  09/01/18 125 lb (56.7 kg)  07/16/18 121 lb (54.9 kg)  05/08/18 120 lb 6.4 oz (54.6 kg)    General: Appears her stated age, well developed, well nourished in NAD. Skin: No rashes, redness, or warmth of left lower extremity noted. HEENT: Head: normal shape and size; Ears: Right ear TM obscured by dead skin/eczema, Left ear: Tm's gray and intact, normal light reflex;   Neck:  Neck supple, trachea midline. No masses, lumps or thyromegaly present.  Cardiovascular: Normal rate and rhythm. S1,S2 noted.  No murmur, rubs or gallops noted. No JVD or BLE edema. Pulmonary/Chest:  Normal effort and positive vesicular breath sounds. No respiratory distress. No wheezes, rales or ronchi noted.  Musculoskeletal: Strength 5/5 BLE. No difficulty with gait.  Neuro: Sensation intact to BLE.  BMET    Component Value Date/Time   NA 137 11/06/2018 1347   K 3.7 11/06/2018 1347   K 4.0 02/24/2012 0743   CL 105 11/06/2018 1347   CO2 25 11/06/2018 1347   GLUCOSE 97 11/06/2018 1347   BUN 19 11/06/2018 1347   CREATININE 0.93 11/06/2018 1347   CALCIUM 9.0 11/06/2018 1347   GFRNONAA >60 11/06/2018 1347   GFRAA >60 11/06/2018 1347    Lipid Panel     Component Value Date/Time   CHOL 164 04/28/2018 0957   TRIG 37.0 04/28/2018 0957   HDL 77.50 04/28/2018 0957   CHOLHDL 2 04/28/2018 0957   VLDL 7.4 04/28/2018 0957   LDLCALC 79 04/28/2018 0957    CBC    Component Value Date/Time   WBC 3.3 (L) 11/06/2018 1347   RBC 4.09 11/06/2018 1347   HGB 12.2 11/06/2018 1347   HGB 13.4 11/08/2014 1059   HCT 36.5 11/06/2018 1347   HCT 39.8 11/08/2014 1059   PLT 279 11/06/2018 1347   PLT 318 11/08/2014 1059   MCV 89.2 11/06/2018 1347   MCV 88 11/08/2014 1059   MCH 29.8 11/06/2018 1347   MCHC 33.4 11/06/2018 1347   RDW 11.9 11/06/2018 1347   RDW 12.7 11/08/2014 1059   LYMPHSABS 1.4 11/06/2018 1347   LYMPHSABS 1.3 11/08/2014 1059   MONOABS 0.2 11/06/2018 1347   MONOABS 0.3 11/08/2014 1059   EOSABS 0.2 11/06/2018 1347   EOSABS 0.2 11/08/2014 1059   BASOSABS 0.1 11/06/2018 1347   BASOSABS 0.0 11/08/2014 1059    Hgb A1C Lab Results  Component Value Date   HGBA1C 5.1 07/26/2015           Assessment & Plan:   Muscle Cramp, Left Leg:  Suspect mineral/vitamin deficiency vs muscle spasm? Will check BMET, Magnesium today Encouraged increased consumption of water.  Encouraged use of heat and stretching to alleviate symptoms.  Eczematous Dermatitis, Right Ear:  Flushed ear today.  Used curette to assist in removal. Looks more like shedding skin.   Refer to Audiology to assess hearing loss as she is still hearing "different noises" post lavage. RX for Cortisporin to right ear to see if this will help  Return precautions discussed Webb Silversmith, NP

## 2019-02-19 NOTE — Patient Instructions (Signed)
Leg Cramps Leg cramps occur when one or more muscles tighten and you have no control over this tightening (involuntary muscle contraction). Muscle cramps can develop in any muscle, but the most common place is in the calf muscles of the leg. Those cramps can occur during exercise or when you are at rest. Leg cramps are painful, and they may last for a few seconds to a few minutes. Cramps may return several times before they finally stop. Usually, leg cramps are not caused by a serious medical problem. In many cases, the cause is not known. Some common causes include:  Excessive physical effort (overexertion), such as during intense exercise.  Overuse from repetitive motions, or doing the same thing over and over.  Staying in a certain position for a long period of time.  Improper preparation, form, or technique while performing a sport or an activity.  Dehydration.  Injury.  Side effects of certain medicines.  Abnormally low levels of minerals in your blood (electrolytes), especially potassium and calcium. This could result from: ? Pregnancy. ? Taking diuretic medicines. Follow these instructions at home: Eating and drinking  Drink enough fluid to keep your urine pale yellow. Staying hydrated may help prevent cramps.  Eat a healthy diet that includes plenty of nutrients to help your muscles function. A healthy diet includes fruits and vegetables, lean protein, whole grains, and low-fat or nonfat dairy products. Managing pain, stiffness, and swelling      Try massaging, stretching, and relaxing the affected muscle. Do this for several minutes at a time.  If directed, put ice on areas that are sore or painful after a cramp: ? Put ice in a plastic bag. ? Place a towel between your skin and the bag. ? Leave the ice on for 20 minutes, 2-3 times a day.  If directed, apply heat to muscles that are tense or tight. Do this before you exercise, or as often as told by your health care  provider. Use the heat source that your health care provider recommends, such as a moist heat pack or a heating pad. ? Place a towel between your skin and the heat source. ? Leave the heat on for 20-30 minutes. ? Remove the heat if your skin turns bright red. This is especially important if you are unable to feel pain, heat, or cold. You may have a greater risk of getting burned.  Try taking hot showers or baths to help relax tight muscles. General instructions  If you are having frequent leg cramps, avoid intense exercise for several days.  Take over-the-counter and prescription medicines only as told by your health care provider.  Keep all follow-up visits as told by your health care provider. This is important. Contact a health care provider if:  Your leg cramps get more severe or more frequent, or they do not improve over time.  Your foot becomes cold, numb, or blue. Summary  Muscle cramps can develop in any muscle, but the most common place is in the calf muscles of the leg.  Leg cramps are painful, and they may last for a few seconds to a few minutes.  Usually, leg cramps are not caused by a serious medical problem. Often, the cause is not known.  Stay hydrated and take over-the-counter and prescription medicines only as told by your health care provider. This information is not intended to replace advice given to you by your health care provider. Make sure you discuss any questions you have with your health care  provider. Document Released: 08/29/2004 Document Revised: 07/04/2017 Document Reviewed: 05/01/2017 Elsevier Patient Education  2020 [employer].

## 2019-02-19 NOTE — Telephone Encounter (Signed)
Pt already has in office appt with Avie Echevaria NP today at 2:15.

## 2019-02-19 NOTE — Telephone Encounter (Signed)
Will address at appointment today

## 2019-02-19 NOTE — Telephone Encounter (Signed)
[name] Night - Client TELEPHONE ADVICE RECORD [facility] Patient Name: Sandra [name] Gender: Female DOB: 10/29/1945 Age: 73 Y 21 D Return Phone Number: [phone] (Primary) Address: City/State/Zip: [place] [address] Client [name] Night - Client Client Site [name] Physician Webb Silversmith - NP Contact Type Call Who Is Calling Patient / Member / Family / Caregiver Call Type Triage / Clinical Relationship To Patient Self Return Phone Number [phone] (Primary) Chief Complaint Leg Pain Reason for Call Symptomatic / Request for [name] is having cramping in her left leg. No other symptoms. Translation No Nurse Assessment Nurse: Kathi Ludwig, RN, Leana Roe Date/Time (Eastern Time): 02/19/2019 7:20:32 AM Confirm and document reason for call. If symptomatic, describe symptoms. ---Caller states cramping leg leg started yesterday. No swelling. Has the patient had close contact with a person known or suspected to have the novel coronavirus illness OR traveled / lives in area with major community spread (including international travel) in the last 14 days from the onset of symptoms? * If Asymptomatic, screen for exposure and travel within the last 14 days. ---No Does the patient have any new or worsening symptoms? ---Yes Will a triage be completed? ---Yes Related visit to physician within the last 2 weeks? ---No Does the PT have any chronic conditions? (i.e. diabetes, asthma, this includes High risk factors for pregnancy, etc.) ---Yes List chronic conditions. ---HTN Is this a behavioral health or substance abuse call? ---No Guidelines Guideline Title Affirmed Question Affirmed Notes Nurse Date/Time Eilene Ghazi Time) Leg Pain [1] Thigh or calf pain AND [2] only 1 side AND [3] present > 1 hour (Exception: chronic unchanged pain) Kathi Ludwig, RN, Tracie  02/19/2019 7:21:40 AM Disp. Time Eilene Ghazi Time) Disposition Final User PLEASE NOTE: All timestamps contained within this report are represented as [place] Standard Time. CONFIDENTIALTY NOTICE: This fax transmission is intended only for the addressee. It contains information that is legally privileged, confidential or otherwise protected from use or disclosure. If you are not the intended recipient, you are strictly prohibited from reviewing, disclosing, copying using or disseminating any of this information or taking any action in reliance on or regarding this information. If you have received this fax in error, please notify us immediately by telephone so that we can arrange for its return to [place]. Phone: [phone], Toll-Free: [phone], Fax: [phone] Page: 2 of 2 Call Id: [id number] 02/19/2019 7:24:49 AM See HCP within 4 Hours (or PCP triage) Yes Kathi Ludwig, RN, Minerva Ends Disagree/Comply Comply Caller Understands Yes PreDisposition Did not know what to do Care Advice Given Per Guideline SEE HCP WITHIN 4 HOURS (OR PCP TRIAGE): * IF OFFICE WILL BE OPEN: You need to be seen within the next 3 or 4 hours. Call your doctor (or NP/PA) now or as soon as the office opens. CALL EMS IF: You develop any chest pain or shortness of breath. CARE ADVICE given per Leg Pain (Adult) guideline. Comments User: Estevan Ryder, RN Date/Time Eilene Ghazi Time): 02/19/2019 7:23:06 AM chemo neuropathy User: Estevan Ryder, RN Date/Time Eilene Ghazi Time): 02/19/2019 7:24:24 AM pain "7-8" yesterday, better today Referrals REFERRED TO PCP OFFICE

## 2019-02-20 LAB — BASIC METABOLIC PANEL
BUN: 13 mg/dL (ref 7–25)
CO2: 28 mmol/L (ref 20–32)
Calcium: 9.5 mg/dL (ref 8.6–10.4)
Chloride: 105 mmol/L (ref 98–110)
Creat: 0.83 mg/dL (ref 0.60–0.93)
Glucose, Bld: 88 mg/dL (ref 65–99)
Potassium: 4 mmol/L (ref 3.5–5.3)
Sodium: 140 mmol/L (ref 135–146)

## 2019-02-20 LAB — MAGNESIUM: Magnesium: 1.8 mg/dL (ref 1.5–2.5)

## 2019-02-22 ENCOUNTER — Encounter (HOSPITAL_COMMUNITY): Payer: Self-pay

## 2019-02-22 ENCOUNTER — Other Ambulatory Visit: Payer: Self-pay

## 2019-02-22 ENCOUNTER — Telehealth: Payer: Self-pay | Admitting: Internal Medicine

## 2019-02-22 ENCOUNTER — Other Ambulatory Visit: Payer: Self-pay | Admitting: Internal Medicine

## 2019-02-22 ENCOUNTER — Ambulatory Visit (HOSPITAL_COMMUNITY)
Admission: RE | Admit: 2019-02-22 | Discharge: 2019-02-22 | Disposition: A | Discharge status: Home / Self Care | Payer: Medicare Other | Source: Ambulatory Visit | Attending: Internal Medicine | Admitting: Internal Medicine

## 2019-02-22 ENCOUNTER — Telehealth: Payer: Self-pay

## 2019-02-22 DIAGNOSIS — M79662 Pain in left lower leg: Secondary | ICD-10-CM | POA: Diagnosis present

## 2019-02-22 NOTE — Telephone Encounter (Signed)
Will obtain ultrasound to rule out DVT, someone will call her to schedule this.

## 2019-02-22 NOTE — Telephone Encounter (Signed)
Ok to make appt if wanted. Getting [place]. Awaiting results.

## 2019-02-22 NOTE — Telephone Encounter (Signed)
[place] Night - Client TELEPHONE ADVICE RECORD [facility] Patient Name: Sandra [name] Gender: Female DOB: [birth year] Age: 73 Y 24 D Return Phone Number: [phone] (Primary) Address: City/State/Zip: [place] [address] Client [facility] Night - Client Client Site [place] Physician Webb Silversmith - NP Contact Type Call Who Is Calling Patient / Member / Family / Caregiver Call Type Triage / Clinical Relationship To Patient Self Return Phone Number [phone] (Primary) Chief Complaint Leg Pain Reason for Call Request to Schedule Office Appointment Initial Comment Caller is needing to make appt for a follow from Friday. Still having pain in her leg. Translation No Nurse Assessment Nurse: Raphael Gibney, RN, Vera Date/Time (Eastern Time): 02/22/2019 7:53:10 AM Confirm and document reason for call. If symptomatic, describe symptoms. ---Caller states she had appt with doctor on Friday. Having pain in left lower calf. had blood work on Friday. she was instructed to exercise leg, drink lots of water. Pain radiated to her hip on Saturday. has taken tylenol. No swelling or redness. pain level 8-10. Has the patient had close contact with a person known or suspected to have the novel coronavirus illness OR traveled / lives in area with major community spread (including international travel) in the last 14 days from the onset of symptoms? * If Asymptomatic, screen for exposure and travel within the last 14 days. ---No Does the patient have any new or worsening symptoms? ---Yes Will a triage be completed? ---Yes Related visit to physician within the last 2 weeks? ---Yes Does the PT have any chronic conditions? (i.e. diabetes, asthma, this includes High risk factors for pregnancy, etc.) ---Yes List chronic conditions. ---HTN; neuropathy Is this a behavioral health or substance abuse call?  ---No Guidelines Guideline Title Affirmed Question Affirmed Notes Nurse Date/Time Eilene Ghazi Time) Leg Pain [1] Thigh or calf pain AND [2] only 1 side AND [3] present > 1 hour (Exception: chronic unchanged pain) Raphael Gibney, RN, Vanita Ingles 02/22/2019 7:57:56 AM PLEASE NOTE: All timestamps contained within this report are represented as [place] Standard Time. CONFIDENTIALTY NOTICE: This fax transmission is intended only for the addressee. It contains information that is legally privileged, confidential or otherwise protected from use or disclosure. If you are not the intended recipient, you are strictly prohibited from reviewing, disclosing, copying using or disseminating any of this information or taking any action in reliance on or regarding this information. If you have received this fax in error, please notify us immediately by telephone so that we can arrange for its return to [place]. Phone: [phone], Toll-Free: [phone], Fax: [phone] Page: 2 of 2 Call Id: [id number] [place]. Time Eilene Ghazi Time) Disposition Final User 02/22/2019 8:03:24 AM See HCP within 4 Hours (or PCP triage) Yes Raphael Gibney, RN, Doreatha Lew Disagree/Comply Comply Caller Understands Yes PreDisposition Did not know what to do Care Advice Given Per Guideline SEE HCP WITHIN 4 HOURS (OR PCP TRIAGE): * IF OFFICE WILL BE OPEN: You need to be seen within the next 3 or 4 hours. Call your doctor (or NP/PA) now or as soon as the office opens. CARE ADVICE given per Leg Pain (Adult) guideline. CALL EMS IF: You develop any chest pain or shortness of breath. Comments User: Dannielle Burn, RN Date/Time Eilene Ghazi Time): 02/22/2019 8:03:13 AM warm transferred to the office Referrals REFERRED TO PCP OFFICE

## 2019-02-22 NOTE — Progress Notes (Unsigned)
Left lower venous has been completed and is negative for DVT. Preliminary results can be found under CV proc through chart review.  Maire Govan RVT [facility]  

## 2019-02-22 NOTE — Telephone Encounter (Signed)
Pt called to advise her pain has been worse over the weekend. Pain is in the lower calf muscle. Pain is all the way to her hip, she was unable to get rest b/c she was not comfortable. She has been put heat on her leg, she has been taking tylenol but she does not think it is getting better, She has increased her water intake.

## 2019-02-22 NOTE — Telephone Encounter (Signed)
See telephone note from today.

## 2019-02-23 ENCOUNTER — Telehealth: Payer: Self-pay | Admitting: Internal Medicine

## 2019-02-23 NOTE — Telephone Encounter (Signed)
Patient called today and stated she had a Sonogram done yesterday afternoon on her leg.  Patient stated that her leg is still giving her problems and She would like to know what she should do next.     Patient's C/B # [phone]

## 2019-02-23 NOTE — Telephone Encounter (Signed)
See result note.  

## 2019-02-24 ENCOUNTER — Ambulatory Visit: Payer: Medicare Other | Admitting: Internal Medicine

## 2019-02-24 ENCOUNTER — Other Ambulatory Visit: Payer: Self-pay | Admitting: Internal Medicine

## 2019-02-24 MED ORDER — TRAMADOL HCL 50 MG PO TABS: 50.0000 mg | ORAL_TABLET | Freq: Two times a day (BID) | ORAL | 0 refills | Status: AC | PRN

## 2019-02-24 MED ORDER — METHOCARBAMOL 500 MG PO TABS: 250.0000 mg | ORAL_TABLET | Freq: Every evening | ORAL | 0 refills | Status: DC | PRN

## 2019-02-24 NOTE — Progress Notes (Deleted)
Subjective:    Patient ID: Sandra [name], female    DOB: 1946-04-17, 73 y.o.   MRN: [id number]  HPI  Patient presents to the clinic today to follow up leg pain. She was seen 7/20 for the same. Labs were unremarkable. LLE ultrasound was negative for DVT.  Review of Systems  Past Medical History:  Diagnosis Date  . Breast cancer ([place]) 07/13/2015   RIGHT lumpectomy 07/13/15, pT1c, pN0,M0 Stage 1 Er/PR pos, her 2 neg. MammaPrint High  . Hyperlipidemia   . Hypertension   . Neuropathy   . 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 Personal history of chemotherapy 2016  . Personal history of radiation therapy 2016  . Visual blurriness    SOMETHING NEW THAT HAS DEVELOPED SINCE 07-13-15 SURGERY- SEEN AT [facility] ENT AND EXAM WAS NORMAL PER PT (08-09-15)    Current Outpatient Medications  Medication Sig Dispense Refill  . amLODipine (NORVASC) 5 MG tablet Take 1 tablet by mouth once daily 90 tablet 0  . anastrozole (ARIMIDEX) 1 MG tablet Take 1 tablet by mouth once daily 90 tablet 0  . b complex vitamins tablet Take 1 tablet by mouth daily.    Marland Kitchen BIOTIN PO Take 5,000 mcg by mouth daily.    . Calcium Carbonate-Vitamin D (CALCIUM 500 + D PO) Take 2 tablets by mouth daily.    . COD LIVER OIL/VITAMINS A & D CAPS Take by mouth.    . Flaxseed, Linseed, (FLAX SEEDS PO) Take by mouth daily.    Marland Kitchen gabapentin (NEURONTIN) 300 MG capsule TAKE 1 CAPSULE BY MOUTH THREE TIMES DAILY 90 capsule 1  . Garlic 9450 MG CAPS Take by mouth.    Marland Kitchen lisinopril (ZESTRIL) 10 MG tablet Take 1 tablet by mouth once daily 90 tablet 0  . neomycin-polymyxin-hydrocortisone (CORTISPORIN) OTIC solution Place 3 drops into the right ear 4 (four) times daily. 10 mL 0  . PAPAYA ENZYMES PO Take by mouth daily.    . Potassium 99 MG TABS Take 99 mg by mouth daily.     . Turmeric 500 MG CAPS Take 1 capsule by mouth daily.     No current facility-administered medications for this visit.     Allergies   Allergen Reactions  . Cymbalta [Duloxetine Hcl] Other (See Comments)    Glaucoma.  Unable to take  . Lovastatin Anaphylaxis    Tongue swelling    Family History  Problem Relation Age of Onset  . Cancer Sister 75       breast cancer  . Breast cancer Sister 33  . Colon cancer Neg Hx     Social History   Socioeconomic History  . Marital status: Married    Spouse name: Not on file  . Number of children: Not on file  . Years of education: Not on file  . Highest education level: Not on file  Occupational History  . Not on file  Social Needs  . Financial resource strain: Not on file  . Food insecurity    Worry: Not on file    Inability: Not on file  . Transportation needs    Medical: Not on file    Non-medical: Not on file  Tobacco Use  . Smoking status: Never Smoker  . Smokeless tobacco: Never Used  Substance and Sexual Activity  . Alcohol use: No    Alcohol/week: 0.0 standard drinks  . Drug use: No  . Sexual activity: Yes  Lifestyle  . Physical  activity    Days per week: Not on file    Minutes per session: Not on file  . Stress: Not on file  Relationships  . Social Herbalist on phone: Not on file    Gets together: Not on file    Attends religious service: Not on file    Active member of club or organization: Not on file    Attends meetings of clubs or organizations: Not on file    Relationship status: Not on file  . Intimate partner violence    Fear of current or ex partner: Not on file    Emotionally abused: Not on file    Physically abused: Not on file    Forced sexual activity: Not on file  Other Topics Concern  . Not on file  Social History Narrative   End of life wishes (updated 01/2013)-   Daughter of wishes (Cherlyl Duwayne Heck)      Desires CPR.   Desires life support if reasonable.           Constitutional: Denies fever, malaise, fatigue, headache or abrupt weight changes.  HEENT: Denies eye pain, eye redness, ear pain, ringing in  the ears, wax buildup, runny nose, nasal congestion, bloody nose, or sore throat. Respiratory: Denies difficulty breathing, shortness of breath, cough or sputum production.   Cardiovascular: Denies chest pain, chest tightness, palpitations or swelling in the hands or feet.  Gastrointestinal: Denies abdominal pain, bloating, constipation, diarrhea or blood in the stool.  GU: Denies urgency, frequency, pain with urination, burning sensation, blood in urine, odor or discharge. Musculoskeletal: Denies decrease in range of motion, difficulty with gait, muscle pain or joint pain and swelling.  Skin: Denies redness, rashes, lesions or ulcercations.  Neurological: Denies dizziness, difficulty with memory, difficulty with speech or problems with balance and coordination.  Psych: Denies anxiety, depression, SI/HI.  No other specific complaints in a complete review of systems (except as listed in HPI above).     Objective:   Physical Exam   LMP  (LMP Unknown)  Wt Readings from Last 3 Encounters:  02/19/19 123 lb (55.8 kg)  09/01/18 125 lb (56.7 kg)  07/16/18 121 lb (54.9 kg)    General: Appears her stated age, well developed, well nourished in NAD. Skin: Warm, dry and intact. No rashes, lesions or ulcerations noted. HEENT: Head: normal shape and size; Eyes: sclera white, no icterus, conjunctiva pink, PERRLA and EOMs intact; Ears: Tm's gray and intact, normal light reflex; Nose: mucosa pink and moist, septum midline; Throat/Mouth: Teeth present, mucosa pink and moist, no exudate, lesions or ulcerations noted.  Neck:  Neck supple, trachea midline. No masses, lumps or thyromegaly present.  Cardiovascular: Normal rate and rhythm. S1,S2 noted.  No murmur, rubs or gallops noted. No JVD or BLE edema. No carotid bruits noted. Pulmonary/Chest: Normal effort and positive vesicular breath sounds. No respiratory distress. No wheezes, rales or ronchi noted.  Abdomen: Soft and nontender. Normal bowel sounds. No  distention or masses noted. Liver, spleen and kidneys non palpable. Musculoskeletal: Normal range of motion. No signs of joint swelling. No difficulty with gait.  Neurological: Alert and oriented. Cranial nerves II-XII grossly intact. Coordination normal.  Psychiatric: Mood and affect normal. Behavior is normal. Judgment and thought content normal.   EKG:  BMET    Component Value Date/Time   NA 140 02/19/2019 1452   K 4.0 02/19/2019 1452   K 4.0 02/24/2012 0743   CL 105 02/19/2019 1452   CO2  28 02/19/2019 1452   GLUCOSE 88 02/19/2019 1452   BUN 13 02/19/2019 1452   CREATININE 0.83 02/19/2019 1452   CALCIUM 9.5 02/19/2019 1452   GFRNONAA >60 11/06/2018 1347   GFRAA >60 11/06/2018 1347    Lipid Panel     Component Value Date/Time   CHOL 164 04/28/2018 0957   TRIG 37.0 04/28/2018 0957   HDL 77.50 04/28/2018 0957   CHOLHDL 2 04/28/2018 0957   VLDL 7.4 04/28/2018 0957   LDLCALC 79 04/28/2018 0957    CBC    Component Value Date/Time   WBC 3.3 (L) 11/06/2018 1347   RBC 4.09 11/06/2018 1347   HGB 12.2 11/06/2018 1347   HGB 13.4 11/08/2014 1059   HCT 36.5 11/06/2018 1347   HCT 39.8 11/08/2014 1059   PLT 279 11/06/2018 1347   PLT 318 11/08/2014 1059   MCV 89.2 11/06/2018 1347   MCV 88 11/08/2014 1059   MCH 29.8 11/06/2018 1347   MCHC 33.4 11/06/2018 1347   RDW 11.9 11/06/2018 1347   RDW 12.7 11/08/2014 1059   LYMPHSABS 1.4 11/06/2018 1347   LYMPHSABS 1.3 11/08/2014 1059   MONOABS 0.2 11/06/2018 1347   MONOABS 0.3 11/08/2014 1059   EOSABS 0.2 11/06/2018 1347   EOSABS 0.2 11/08/2014 1059   BASOSABS 0.1 11/06/2018 1347   BASOSABS 0.0 11/08/2014 1059    Hgb A1C Lab Results  Component Value Date   HGBA1C 5.1 07/26/2015           Assessment & Plan:

## 2019-02-26 ENCOUNTER — Encounter: Payer: Self-pay | Admitting: General Surgery

## 2019-03-01 ENCOUNTER — Other Ambulatory Visit: Payer: Self-pay | Admitting: Internal Medicine

## 2019-03-01 DIAGNOSIS — H9191 Unspecified hearing loss, right ear: Secondary | ICD-10-CM

## 2019-03-10 ENCOUNTER — Other Ambulatory Visit: Payer: Self-pay

## 2019-03-10 DIAGNOSIS — Z170 Estrogen receptor positive status [ER+]: Secondary | ICD-10-CM

## 2019-03-10 DIAGNOSIS — C50211 Malignant neoplasm of upper-inner quadrant of right female breast: Secondary | ICD-10-CM

## 2019-03-11 ENCOUNTER — Other Ambulatory Visit: Payer: Self-pay

## 2019-03-11 ENCOUNTER — Other Ambulatory Visit: Payer: Self-pay | Admitting: Internal Medicine

## 2019-03-11 ENCOUNTER — Telehealth: Payer: Self-pay | Admitting: *Deleted

## 2019-03-11 ENCOUNTER — Inpatient Hospital Stay: Payer: Medicare Other | Admitting: Internal Medicine

## 2019-03-11 ENCOUNTER — Inpatient Hospital Stay: Payer: Medicare Other

## 2019-03-11 NOTE — Assessment & Plan Note (Deleted)
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 STABLE.   # No clinical evidence of recurrence.  Continue anastrozole at this time.  Tolerating without any major side effects.   # Osteopenia- BMD Nov 2018; T score= -.2. Continue calcium and vitamin D. STABLE. Will order BMD in fall-winter of 2020.   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   # Grade 2 neuropathy- STABLE; Neurontin 300 mg TID.   # DISPOSITION:  # follow up in 4 months-MD/labs- cbc/cmp-Dr.B.  

## 2019-03-11 NOTE — Telephone Encounter (Signed)
Patient no showed for today's visit. I contacted the patient to f/u on her no show visit. She states that she was not aware that she had a scheduled apt today. Visit r/s for tom. At 10 am and 1015 am with Dr. Jacinto Reap.

## 2019-03-11 NOTE — Progress Notes (Deleted)
[place] @ [facility] [phone]  [phone]   INITIAL CONSULT  Sandra [name] OB: 1946/04/16  MR#: [id number]  ESP#:[id number]  Patient Care Team: Jearld Fenton, NP as PCP - General (Internal Medicine) Glennie Isle, PA-C (Physician Assistant) Pyrtle, Lajuan Lines, MD as Consulting Physician (Gastroenterology) Lucille Passy, MD as Consulting Physician (Family Medicine) Christene Lye, MD (General Surgery) Cammie Sickle, MD as Consulting Physician (Internal Medicine) Leona Singleton, RN as Oncology Nurse Navigator Noreene Filbert, MD as Referring Physician (Radiation Oncology)  CHIEF COMPLAINT:  No chief complaint on file.  VISIT DIAGNOSIS:   No diagnosis found.   Oncology History Overview Note   carcinoma breast (right) inner and upper quadrant T1 cN0 M0 tumor Estrogen receptor positive progesterone receptor positive HER-2 receptor negative by fish Mammo print shows high risk (diagnosis in January of 2016) patient has 22% average risk in 5 years and 29% average risk in 10 years for distant metastectomy disease without chemotherapy on anti-hormonal treatment 2.  Patient started Cytoxan and Adriamycin on now August 30, 2015 MUGA scan shows ejection fraction of baseline 55%.  3.Patient has finished total of 4 cycles of chemotherapy on November 01, 2015 4.  Started on the Taxol from November 22, 2015; FINISHED RT [sep 2017] # OCT 2017- START ARIMIDEX   # NOV 2017- BMD- OSTEOPENIA- ca+vit D  --------------------------------------------------------   DIAGNOSIS: RIGHT BREAST CA  STAGE: I        ;GOALS: cure  CURRENT/MOST RECENT THERAPY: on anastrazole      Carcinoma of upper-inner quadrant of right breast in female, estrogen receptor positive ([name])     INTERVAL HISTORY: 73 year old lady History of stage I right-sided breast cancer ER/PR positive currently on Arimidex is here for follow-up.  Patient overall doing well.  Denies  any headaches.  Denies any nausea.  Denies any bone pain.  Continues to have mild tingling and numbness in her feet.  Awaiting to start gabapentin.   Review of Systems  Constitutional: Negative for chills, diaphoresis, fever, malaise/fatigue and weight loss.  HENT: Negative for nosebleeds and sore throat.   Eyes: Negative for double vision.  Respiratory: Negative for cough, hemoptysis, sputum production, shortness of breath and wheezing.   Cardiovascular: Negative for chest pain, palpitations, orthopnea and leg swelling.  Gastrointestinal: Negative for abdominal pain, blood in stool, constipation, diarrhea, heartburn, melena, nausea and vomiting.  Genitourinary: Negative for dysuria, frequency and urgency.  Musculoskeletal: Negative for back pain and joint pain.  Skin: Negative.  Negative for itching and rash.  Neurological: Negative for dizziness, tingling, focal weakness, weakness and headaches.  Endo/Heme/Allergies: Does not bruise/bleed easily.  Psychiatric/Behavioral: Negative for depression. The patient is not nervous/anxious and does not have insomnia.      PAST MEDICAL HISTORY: Past Medical History:  Diagnosis Date  . Breast cancer ([place]) 07/13/2015   RIGHT lumpectomy 07/13/15, pT1c, pN0,M0 Stage 1 Er/PR pos, her 2 neg. MammaPrint High  . Hyperlipidemia   . Hypertension   . Neuropathy   . 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 Personal history of chemotherapy 2016  . Personal history of radiation therapy 2016  . Visual blurriness    SOMETHING NEW THAT HAS DEVELOPED SINCE 07-13-15 SURGERY- SEEN AT [facility] ENT AND EXAM WAS NORMAL PER PT (08-09-15)    PAST SURGICAL HISTORY: Past Surgical History:  Procedure Laterality Date  . ABDOMINAL HYSTERECTOMY  1987  . BREAST BIOPSY Right    neg  .  BREAST BIOPSY Left    4 oclock, FIBROEPITHELIAL LESION FAVOR CELLULAR FIBROADENOMA  . BREAST BIOPSY Left    2 oclock, CYSTIC APOCRINE METAPLASIA  . BREAST BIOPSY  Right 07/13/15   130 INVASIVE MAMMARY CARCINOMA, NO SPECIAL TYPE  . BREAST EXCISIONAL BIOPSY Left 2016   fibroadenoma  . BREAST LUMPECTOMY Left 07/13/2015  . BREAST LUMPECTOMY WITH SENTINEL LYMPH NODE BIOPSY Right 07/13/2015   Procedure: BREAST LUMPECTOMY WITH SENTINEL LYMPH NODE BX;  Surgeon: Christene Lye, MD;  Location: [facility] ORS;  Service: General;  Laterality: Right;  . BUNIONECTOMY Right 1993  . CATARACT EXTRACTION W/PHACO Left 09/17/2016   Procedure: CATARACT EXTRACTION PHACO AND INTRAOCULAR LENS PLACEMENT (IOC);  Surgeon: Birder Robson, MD;  Location: [facility] ORS;  Service: Ophthalmology;  Laterality: Left;  [place] 00:47AP% 17.6CDE 8.39Fluid pack lot # [id number]  . CATARACT EXTRACTION W/PHACO Right 01/21/2017   Procedure: CATARACT EXTRACTION PHACO AND INTRAOCULAR LENS PLACEMENT (IOC);  Surgeon: Birder Robson, MD;  Location: [facility] ORS;  Service: Ophthalmology;  Laterality: Right;  [place] 00:39 AP% 13.0 CDE 5.16 fluid pack lot # [id number]  . COLONOSCOPY  2015  . fatty tumor Left 2013   side  . PORT-A-CATH REMOVAL  2017  . PORTACATH PLACEMENT Left 08/16/2015   Procedure: INSERTION PORT-A-CATH;  Surgeon: Christene Lye, MD;  Location: [facility] ORS;  Service: General;  Laterality: Left;    FAMILY HISTORY Family History  Problem Relation Age of Onset  . Cancer Sister 69       breast cancer  . Breast cancer Sister 31  . Colon cancer Neg Hx         ADVANCED DIRECTIVES:   Patient does not have any living will or healthcare power of attorney.  Information was given .  Available resources had been discussed.  We will follow-up on subsequent appointments regarding this issue HEALTH MAINTENANCE: Social History   Tobacco Use  . Smoking status: Never Smoker  . Smokeless tobacco: Never Used  Substance Use Topics  . Alcohol use: No    Alcohol/week: 0.0 standard drinks  . Drug use: No      :  Allergies  Allergen Reactions  . Cymbalta [Duloxetine Hcl] Other (See Comments)     Glaucoma.  Unable to take  . Lovastatin Anaphylaxis    Tongue swelling    Current Outpatient Medications  Medication Sig Dispense Refill  . amLODipine (NORVASC) 5 MG tablet Take 1 tablet by mouth once daily 90 tablet 0  . anastrozole (ARIMIDEX) 1 MG tablet Take 1 tablet by mouth once daily 90 tablet 0  . b complex vitamins tablet Take 1 tablet by mouth daily.    Marland Kitchen BIOTIN PO Take 5,000 mcg by mouth daily.    . Calcium Carbonate-Vitamin D (CALCIUM 500 + D PO) Take 2 tablets by mouth daily.    . COD LIVER OIL/VITAMINS A & D CAPS Take by mouth.    . Flaxseed, Linseed, (FLAX SEEDS PO) Take by mouth daily.    Marland Kitchen gabapentin (NEURONTIN) 300 MG capsule TAKE 1 CAPSULE BY MOUTH THREE TIMES DAILY 90 capsule 1  . Garlic 2706 MG CAPS Take by mouth.    Marland Kitchen lisinopril (ZESTRIL) 10 MG tablet Take 1 tablet by mouth once daily 90 tablet 0  . methocarbamol (ROBAXIN) 500 MG tablet Take 0.5 tablets (250 mg total) by mouth at bedtime as needed for muscle spasms. 10 tablet 0  . neomycin-polymyxin-hydrocortisone (CORTISPORIN) OTIC solution Place 3 drops into the right ear 4 (four) times daily. 10  mL 0  . PAPAYA ENZYMES PO Take by mouth daily.    . Potassium 99 MG TABS Take 99 mg by mouth daily.     . Turmeric 500 MG CAPS Take 1 capsule by mouth daily.     No current facility-administered medications for this visit.     OBJECTIVE: Physical Exam  Constitutional: She is oriented to person, place, and time and well-developed, well-nourished, and in no distress.  HENT:  Head: Normocephalic and atraumatic.  Mouth/Throat: Oropharynx is clear and moist. No oropharyngeal exudate.  Eyes: Pupils are equal, round, and reactive to light.  Neck: Normal range of motion. Neck supple.  Cardiovascular: Normal rate and regular rhythm.  Pulmonary/Chest: No respiratory distress. She has no wheezes.  Abdominal: Soft. Bowel sounds are normal. She exhibits no distension and no mass. There is no abdominal tenderness. There is no  rebound and no guarding.  Musculoskeletal: Normal range of motion.        General: No tenderness or edema.  Neurological: She is alert and oriented to person, place, and time.  Skin: Skin is warm.  Right and left BREAST exam (in the presence of nurse)- no unusual skin changes or dominant masses felt. Surgical scars noted.    Psychiatric: Affect normal.      There were no vitals filed for this visit.   There is no height or weight on file to calculate BMI.    ECOG FS:0 - Asymptomatic  LAB RESULTS:  No visits with results within 5 Day(s) from this visit.  Latest known visit with results is:  Office Visit on 02/19/2019  Component Date Value Ref Range Status  . Glucose, Bld 02/19/2019 88  65 - 99 mg/dL Final   Comment: .            Fasting reference interval .   . BUN 02/19/2019 13  7 - 25 mg/dL Final  . Creat 02/19/2019 0.83  0.60 - 0.93 mg/dL Final   Comment: For patients >57 years of age, the reference limit for Creatinine is approximately 13% higher for people identified as African-American. .   Havery Moros Ratio 35/45/6256 NOT APPLICABLE  6 - 22 (calc) Final  . Sodium 02/19/2019 140  135 - 146 mmol/L Final  . Potassium 02/19/2019 4.0  3.5 - 5.3 mmol/L Final  . Chloride 02/19/2019 105  98 - 110 mmol/L Final  . CO2 02/19/2019 28  20 - 32 mmol/L Final  . Calcium 02/19/2019 9.5  8.6 - 10.4 mg/dL Final  . Magnesium 02/19/2019 1.8  1.5 - 2.5 mg/dL Final     STUDIES: Vas [place] Lower Extremity Venous (dvt)  Result Date: 02/22/2019  Lower Venous Study Other Indications: Patient complains of left calf pain for three days. She                    denies any shortness of breath. Risk Factors: None identified. Performing Technologist: Wilkie Aye RVT  Examination Guidelines: A complete evaluation includes B-mode imaging, spectral Doppler, color Doppler, and power Doppler as needed of all accessible portions of each vessel. Bilateral testing is considered an integral part of a  complete examination. Limited examinations for reoccurring indications may be performed as noted.  +---------+---------------+---------+-----------+----------+-------+ LEFT     CompressibilityPhasicitySpontaneityPropertiesSummary +---------+---------------+---------+-----------+----------+-------+ CFV      Full           Yes      Yes                          +---------+---------------+---------+-----------+----------+-------+  SFJ      Full           Yes      Yes                          +---------+---------------+---------+-----------+----------+-------+ FV Prox  Full           Yes      Yes                          +---------+---------------+---------+-----------+----------+-------+ FV Mid   Full           Yes      Yes                          +---------+---------------+---------+-----------+----------+-------+ FV DistalFull           Yes      Yes                          +---------+---------------+---------+-----------+----------+-------+ PFV      Full                                                 +---------+---------------+---------+-----------+----------+-------+ POP      Full           Yes      Yes                          +---------+---------------+---------+-----------+----------+-------+ PTV      Full           Yes      Yes                          +---------+---------------+---------+-----------+----------+-------+ PERO     Full           Yes      Yes                          +---------+---------------+---------+-----------+----------+-------+ Gastroc  Full                                                 +---------+---------------+---------+-----------+----------+-------+ GSV      Full           Yes      Yes                          +---------+---------------+---------+-----------+----------+-------+ SSV      Full           Yes      Yes                           +---------+---------------+---------+-----------+----------+-------+  Summary: Right: No evidence of common femoral vein obstruction. Left: No evidence of deep vein thrombosis in the lower extremity. No indirect evidence of obstruction proximal to the inguinal ligament. No cystic structure found in the popliteal fossa.  *See table(s) above for measurements and observations. Electronically signed by Quay Burow  MD on 02/22/2019 at 4:45:33 PM.    Final     ASSESSMENT:   No problem-specific Assessment & Plan notes found for this encounter.  No matching staging information was found for the patient.  Cammie Sickle, MD   03/11/2019 8:23 AM

## 2019-03-12 ENCOUNTER — Inpatient Hospital Stay: Payer: Medicare Other | Admitting: Internal Medicine

## 2019-03-12 ENCOUNTER — Inpatient Hospital Stay: Payer: Medicare Other | Attending: Internal Medicine

## 2019-03-12 ENCOUNTER — Other Ambulatory Visit: Payer: Self-pay

## 2019-03-12 VITALS — BP 147/77 | HR 67 | Temp 96.1°F | Wt 123.0 lb

## 2019-03-12 DIAGNOSIS — C50211 Malignant neoplasm of upper-inner quadrant of right female breast: Secondary | ICD-10-CM

## 2019-03-12 DIAGNOSIS — Z90710 Acquired absence of both cervix and uterus: Secondary | ICD-10-CM | POA: Insufficient documentation

## 2019-03-12 DIAGNOSIS — G629 Polyneuropathy, unspecified: Secondary | ICD-10-CM | POA: Insufficient documentation

## 2019-03-12 DIAGNOSIS — M8580 Other specified disorders of bone density and structure, unspecified site: Secondary | ICD-10-CM | POA: Insufficient documentation

## 2019-03-12 DIAGNOSIS — Z170 Estrogen receptor positive status [ER+]: Secondary | ICD-10-CM

## 2019-03-12 DIAGNOSIS — Z803 Family history of malignant neoplasm of breast: Secondary | ICD-10-CM | POA: Diagnosis not present

## 2019-03-12 DIAGNOSIS — Z79899 Other long term (current) drug therapy: Secondary | ICD-10-CM | POA: Insufficient documentation

## 2019-03-12 DIAGNOSIS — Z79811 Long term (current) use of aromatase inhibitors: Secondary | ICD-10-CM | POA: Diagnosis not present

## 2019-03-12 DIAGNOSIS — I10 Essential (primary) hypertension: Secondary | ICD-10-CM | POA: Insufficient documentation

## 2019-03-12 DIAGNOSIS — E785 Hyperlipidemia, unspecified: Secondary | ICD-10-CM | POA: Diagnosis not present

## 2019-03-12 LAB — CBC WITH DIFFERENTIAL/PLATELET
Abs Immature Granulocytes: 0 10*3/uL (ref 0.00–0.07)
Basophils Absolute: 0.1 10*3/uL (ref 0.0–0.1)
Basophils Relative: 2 %
Eosinophils Absolute: 0.2 10*3/uL (ref 0.0–0.5)
Eosinophils Relative: 8 %
HCT: 38.2 % (ref 36.0–46.0)
Hemoglobin: 12.6 g/dL (ref 12.0–15.0)
Immature Granulocytes: 0 %
Lymphocytes Relative: 38 %
Lymphs Abs: 1 10*3/uL (ref 0.7–4.0)
MCH: 29.4 pg (ref 26.0–34.0)
MCHC: 33 g/dL (ref 30.0–36.0)
MCV: 89.3 fL (ref 80.0–100.0)
Monocytes Absolute: 0.2 10*3/uL (ref 0.1–1.0)
Monocytes Relative: 8 %
Neutro Abs: 1.1 10*3/uL — ABNORMAL LOW (ref 1.7–7.7)
Neutrophils Relative %: 44 %
Platelets: 292 10*3/uL (ref 150–400)
RBC: 4.28 MIL/uL (ref 3.87–5.11)
RDW: 12.3 % (ref 11.5–15.5)
WBC: 2.6 10*3/uL — ABNORMAL LOW (ref 4.0–10.5)
nRBC: 0 % (ref 0.0–0.2)

## 2019-03-12 LAB — COMPREHENSIVE METABOLIC PANEL
ALT: 19 U/L (ref 0–44)
AST: 22 U/L (ref 15–41)
Albumin: 4.1 g/dL (ref 3.5–5.0)
Alkaline Phosphatase: 39 U/L (ref 38–126)
Anion gap: 8 (ref 5–15)
BUN: 13 mg/dL (ref 8–23)
CO2: 26 mmol/L (ref 22–32)
Calcium: 9.1 mg/dL (ref 8.9–10.3)
Chloride: 106 mmol/L (ref 98–111)
Creatinine, Ser: 0.95 mg/dL (ref 0.44–1.00)
GFR calc Af Amer: >60 mL/min (ref >60)
GFR calc non Af Amer: 59 mL/min — ABNORMAL LOW (ref >60)
Glucose, Bld: 110 mg/dL — ABNORMAL HIGH (ref 70–99)
Potassium: 4.1 mmol/L (ref 3.5–5.1)
Sodium: 140 mmol/L (ref 135–145)
Total Bilirubin: 0.8 mg/dL (ref 0.3–1.2)
Total Protein: 7 g/dL (ref 6.5–8.1)

## 2019-03-12 NOTE — Assessment & Plan Note (Addendum)
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 Stable. .   # No clinical evidence of recurrence.  Continue anastrozole at this time.  Tolerating without any major side effects.   # Left calf cramping-question etiology/ultrasound negative; reviewed PCP.  Improved/resolved.  # Osteopenia- BMD Nov 2018; T score= -.2. Continue calcium and vitamin D.  Stable.  Ordered with a mammogram.  # Chronic benign ethnic neutropenia /mild leucopenia- 32.6/ ANC-1.1- overall stable  # Grade 2 neuropathy- stable; Neurontin 300 mg TID.   # DISPOSITION:  # bil mammogram dec 2020/ bone density # follow up in 6 months-MD/labs- cbc/cmp-Dr.B.

## 2019-03-12 NOTE — Progress Notes (Signed)
[place] @ [facility] [phone]  [phone]   INITIAL CONSULT  Sandra [name] OB: 10-Mar-1946  MR#: [id number]  GEZ#:[id number]  Patient Care Team: Jearld Fenton, NP as PCP - General (Internal Medicine) Glennie Isle, PA-C (Physician Assistant) Pyrtle, Lajuan Lines, MD as Consulting Physician (Gastroenterology) Lucille Passy, MD as Consulting Physician (Family Medicine) Christene Lye, MD (General Surgery) Cammie Sickle, MD as Consulting Physician (Internal Medicine) Leona Singleton, RN as Oncology Nurse Navigator Noreene Filbert, MD as Referring Physician (Radiation Oncology)  CHIEF COMPLAINT:  Chief Complaint  Patient presents with  . Breast Cancer  . Follow-up   VISIT DIAGNOSIS:     ICD-10-CM   1. Aromatase inhibitor use  Z79.811 DG Bone Density  2. Carcinoma of upper-inner quadrant of right breast in female, estrogen receptor positive ([employer])  C50.211 MM DIAG BREAST TOMO BILATERAL   Z17.0 DG Bone Density    CBC with Differential    Comprehensive metabolic panel     Oncology History Overview Note   carcinoma breast (right) inner and upper quadrant T1 cN0 M0 tumor Estrogen receptor positive progesterone receptor positive HER-2 receptor negative by fish Mammo print shows high risk (diagnosis in January of 2016) patient has 22% average risk in 5 years and 29% average risk in 10 years for distant metastectomy disease without chemotherapy on anti-hormonal treatment 2.  Patient started Cytoxan and Adriamycin on now August 30, 2015 MUGA scan shows ejection fraction of baseline 55%.  3.Patient has finished total of 4 cycles of chemotherapy on November 01, 2015 4.  Started on the Taxol from November 22, 2015; FINISHED RT [sep 2017] # OCT 2017- START ARIMIDEX   # NOV 2017- BMD- OSTEOPENIA- ca+vit D  --------------------------------------------------------   DIAGNOSIS: RIGHT BREAST CA  STAGE: I        ;GOALS: cure  CURRENT/MOST RECENT  THERAPY: on anastrazole      Carcinoma of upper-inner quadrant of right breast in female, estrogen receptor positive ([place])    INTERVAL HISTORY: 73 year old lady History of stage I right-sided breast cancer ER/PR positive currently on Arimidex is here for follow-up.  Patient noted to have pain in her left calf approximately 3 weeks ago.  Patient had work-up including an ultrasound that was negative for DVT.  Currently pain is improved/resolved.  Denies any new lumps or bumps.  Appetite is good but no weight loss.  No headaches.  No nausea no vomiting.  She continues to have tingling and numbness in the feet.  She is currently on gabapentin.   Review of Systems  Constitutional: Negative for chills, diaphoresis, fever, malaise/fatigue and weight loss.  HENT: Negative for nosebleeds and sore throat.   Eyes: Negative for double vision.  Respiratory: Negative for cough, hemoptysis, sputum production, shortness of breath and wheezing.   Cardiovascular: Negative for chest pain, palpitations, orthopnea and leg swelling.  Gastrointestinal: Negative for abdominal pain, blood in stool, constipation, diarrhea, heartburn, melena, nausea and vomiting.  Genitourinary: Negative for dysuria, frequency and urgency.  Musculoskeletal: Negative for back pain and joint pain.       Left calf cramping  Skin: Negative.  Negative for itching and rash.  Neurological: Positive for tingling. Negative for dizziness, focal weakness, weakness and headaches.  Endo/Heme/Allergies: Does not bruise/bleed easily.  Psychiatric/Behavioral: Negative for depression. The patient is not nervous/anxious and does not have insomnia.      PAST MEDICAL HISTORY: Past Medical History:  Diagnosis Date  . Breast cancer ([place]) 07/13/2015  RIGHT lumpectomy 07/13/15, pT1c, pN0,M0 Stage 1 Er/PR pos, her 2 neg. MammaPrint High  . Hyperlipidemia   . Hypertension   . Neuropathy   . 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 Personal history of chemotherapy 2016  . Personal history of radiation therapy 2016  . Visual blurriness    SOMETHING NEW THAT HAS DEVELOPED SINCE 07-13-15 SURGERY- SEEN AT [facility] AND EXAM WAS NORMAL PER PT (08-09-15)    PAST SURGICAL HISTORY: Past Surgical History:  Procedure Laterality Date  . ABDOMINAL HYSTERECTOMY  1987  . BREAST BIOPSY Right    neg  . BREAST BIOPSY Left    4 oclock, FIBROEPITHELIAL LESION FAVOR CELLULAR FIBROADENOMA  . BREAST BIOPSY Left    2 oclock, CYSTIC APOCRINE METAPLASIA  . BREAST BIOPSY Right 07/13/15   130 INVASIVE MAMMARY CARCINOMA, NO SPECIAL TYPE  . BREAST EXCISIONAL BIOPSY Left 2016   fibroadenoma  . BREAST LUMPECTOMY Left 07/13/2015  . BREAST LUMPECTOMY WITH SENTINEL LYMPH NODE BIOPSY Right 07/13/2015   Procedure: BREAST LUMPECTOMY WITH SENTINEL LYMPH NODE BX;  Surgeon: Christene Lye, MD;  Location: [facility] ORS;  Service: General;  Laterality: Right;  . BUNIONECTOMY Right 1993  . CATARACT EXTRACTION W/PHACO Left 09/17/2016   Procedure: CATARACT EXTRACTION PHACO AND INTRAOCULAR LENS PLACEMENT (IOC);  Surgeon: Birder Robson, MD;  Location: [facility] ORS;  Service: Ophthalmology;  Laterality: Left;  [place] 00:47AP% 17.6CDE 8.39Fluid pack lot # [id number]  . CATARACT EXTRACTION W/PHACO Right 01/21/2017   Procedure: CATARACT EXTRACTION PHACO AND INTRAOCULAR LENS PLACEMENT (IOC);  Surgeon: Birder Robson, MD;  Location: [facility] ORS;  Service: Ophthalmology;  Laterality: Right;  [place] 00:39 AP% 13.0 CDE 5.16 fluid pack lot # [id number]  . COLONOSCOPY  2015  . fatty tumor Left 2013   side  . PORT-A-CATH REMOVAL  2017  . PORTACATH PLACEMENT Left 08/16/2015   Procedure: INSERTION PORT-A-CATH;  Surgeon: Christene Lye, MD;  Location: [facility] ORS;  Service: General;  Laterality: Left;    FAMILY HISTORY Family History  Problem Relation Age of Onset  . Cancer Sister 52       breast cancer  . Breast cancer Sister 82  . Colon cancer Neg Hx          ADVANCED DIRECTIVES:   Patient does not have any living will or healthcare power of attorney.  Information was given .  Available resources had been discussed.  We will follow-up on subsequent appointments regarding this issue HEALTH MAINTENANCE: Social History   Tobacco Use  . Smoking status: Never Smoker  . Smokeless tobacco: Never Used  Substance Use Topics  . Alcohol use: No    Alcohol/week: 0.0 standard drinks  . Drug use: No      :  Allergies  Allergen Reactions  . Cymbalta [Duloxetine Hcl] Other (See Comments)    Glaucoma.  Unable to take  . Lovastatin Anaphylaxis    Tongue swelling    Current Outpatient Medications  Medication Sig Dispense Refill  . amLODipine (NORVASC) 5 MG tablet Take 1 tablet by mouth once daily 90 tablet 0  . anastrozole (ARIMIDEX) 1 MG tablet Take 1 tablet by mouth once daily 90 tablet 0  . b complex vitamins tablet Take 1 tablet by mouth daily.    Marland Kitchen BIOTIN PO Take 5,000 mcg by mouth daily.    . Calcium Carbonate-Vitamin D (CALCIUM 500 + D PO) Take 2 tablets by mouth daily.    . COD LIVER OIL/VITAMINS A &  D CAPS Take by mouth.    . Flaxseed, Linseed, (FLAX SEEDS PO) Take by mouth daily.    . Garlic 6578 MG CAPS Take by mouth.    Marland Kitchen lisinopril (ZESTRIL) 10 MG tablet Take 1 tablet by mouth once daily 90 tablet 0  . neomycin-polymyxin-hydrocortisone (CORTISPORIN) OTIC solution Place 3 drops into the right ear 4 (four) times daily. 10 mL 0  . PAPAYA ENZYMES PO Take by mouth daily.    . Potassium 99 MG TABS Take 99 mg by mouth daily.     . Turmeric 500 MG CAPS Take 1 capsule by mouth daily.    Marland Kitchen gabapentin (NEURONTIN) 300 MG capsule TAKE 1 CAPSULE BY MOUTH THREE TIMES DAILY 90 capsule 1  . methocarbamol (ROBAXIN) 500 MG tablet Take 0.5 tablets (250 mg total) by mouth at bedtime as needed for muscle spasms. (Patient not taking: Reported on 03/12/2019) 10 tablet 0   No current facility-administered medications for this visit.     OBJECTIVE:  Physical Exam  Constitutional: She is oriented to person, place, and time and well-developed, well-nourished, and in no distress.  HENT:  Head: Normocephalic and atraumatic.  Mouth/Throat: Oropharynx is clear and moist. No oropharyngeal exudate.  Eyes: Pupils are equal, round, and reactive to light.  Neck: Normal range of motion. Neck supple.  Cardiovascular: Normal rate and regular rhythm.  Pulmonary/Chest: No respiratory distress. She has no wheezes.  Abdominal: Soft. Bowel sounds are normal. She exhibits no distension and no mass. There is no abdominal tenderness. There is no rebound and no guarding.  Musculoskeletal: Normal range of motion.        General: No tenderness or edema.     Comments: Mild swelling of the left calf.  No tenderness.  Neurological: She is alert and oriented to person, place, and time.  Skin: Skin is warm.  Right and left BREAST exam (in the presence of nurse)- no unusual skin changes or dominant masses felt. Surgical scars noted.    Psychiatric: Affect normal.      Vitals:   03/12/19 1029  BP: (!) 147/77  Pulse: 67  Temp: (!) 96.1 F (35.6 C)     Body mass index is 22.14 kg/m.    ECOG FS:0 - Asymptomatic  LAB RESULTS:  Appointment on 03/12/2019  Component Date Value Ref Range Status  . Sodium 03/12/2019 140  135 - 145 mmol/L Final  . Potassium 03/12/2019 4.1  3.5 - 5.1 mmol/L Final  . Chloride 03/12/2019 106  98 - 111 mmol/L Final  . CO2 03/12/2019 26  22 - 32 mmol/L Final  . Glucose, Bld 03/12/2019 110* 70 - 99 mg/dL Final  . BUN 03/12/2019 13  8 - 23 mg/dL Final  . Creatinine, Ser 03/12/2019 0.95  0.44 - 1.00 mg/dL Final  . Calcium 03/12/2019 9.1  8.9 - 10.3 mg/dL Final  . Total Protein 03/12/2019 7.0  6.5 - 8.1 g/dL Final  . Albumin 03/12/2019 4.1  3.5 - 5.0 g/dL Final  . AST 03/12/2019 22  15 - 41 U/L Final  . ALT 03/12/2019 19  0 - 44 U/L Final  . Alkaline Phosphatase 03/12/2019 39  38 - 126 U/L Final  . Total Bilirubin 03/12/2019 0.8   0.3 - 1.2 mg/dL Final  . GFR calc non Af Amer 03/12/2019 59* >60 mL/min Final  . GFR calc Af Amer 03/12/2019 >60  >60 mL/min Final  . Anion gap 03/12/2019 8  5 - 15 Final   Performed at [facility], [address] [place], [place] [address]  . WBC 03/12/2019 2.6* 4.0 - 10.5 K/uL Final  . RBC 03/12/2019 4.28  3.87 - 5.11 MIL/uL Final  . Hemoglobin 03/12/2019 12.6  12.0 - 15.0 g/dL Final  . HCT 03/12/2019 38.2  36.0 - 46.0 % Final  . MCV 03/12/2019 89.3  80.0 - 100.0 fL Final  . MCH 03/12/2019 29.4  26.0 - 34.0 pg Final  . MCHC 03/12/2019 33.0  30.0 - 36.0 g/dL Final  . RDW 03/12/2019 12.3  11.5 - 15.5 % Final  . Platelets 03/12/2019 292  150 - 400 K/uL Final  . nRBC 03/12/2019 0.0  0.0 - 0.2 % Final  . Neutrophils Relative % 03/12/2019 44  % Final  . Neutro Abs 03/12/2019 1.1* 1.7 - 7.7 K/uL Final  . Lymphocytes Relative 03/12/2019 38  % Final  . Lymphs Abs 03/12/2019 1.0  0.7 - 4.0 K/uL Final  . Monocytes Relative 03/12/2019 8  % Final  . Monocytes Absolute 03/12/2019 0.2  0.1 - 1.0 K/uL Final  . Eosinophils Relative 03/12/2019 8  % Final  . Eosinophils Absolute 03/12/2019 0.2  0.0 - 0.5 K/uL Final  . Basophils Relative 03/12/2019 2  % Final  . Basophils Absolute 03/12/2019 0.1  0.0 - 0.1 K/uL Final  . Immature Granulocytes 03/12/2019 0  % Final  . Abs Immature Granulocytes 03/12/2019 0.00  0.00 - 0.07 K/uL Final   Performed at [facility], [address] [place], [place] [address]     STUDIES: Vas [place] Lower Extremity Venous (dvt)  Result Date: 02/22/2019  Lower Venous Study Other Indications: Patient complains of left calf pain for three days. She                    denies any shortness of breath. Risk Factors: None identified. Performing Technologist: Wilkie Aye RVT  Examination Guidelines: A complete evaluation includes B-mode imaging, spectral Doppler, color Doppler, and power Doppler as needed of all accessible portions of each vessel. Bilateral testing is  considered an integral part of a complete examination. Limited examinations for reoccurring indications may be performed as noted.  +---------+---------------+---------+-----------+----------+-------+ LEFT     CompressibilityPhasicitySpontaneityPropertiesSummary +---------+---------------+---------+-----------+----------+-------+ CFV      Full           Yes      Yes                          +---------+---------------+---------+-----------+----------+-------+ SFJ      Full           Yes      Yes                          +---------+---------------+---------+-----------+----------+-------+ FV Prox  Full           Yes      Yes                          +---------+---------------+---------+-----------+----------+-------+ FV Mid   Full           Yes      Yes                          +---------+---------------+---------+-----------+----------+-------+ FV DistalFull           Yes      Yes                          +---------+---------------+---------+-----------+----------+-------+  PFV      Full                                                 +---------+---------------+---------+-----------+----------+-------+ POP      Full           Yes      Yes                          +---------+---------------+---------+-----------+----------+-------+ PTV      Full           Yes      Yes                          +---------+---------------+---------+-----------+----------+-------+ PERO     Full           Yes      Yes                          +---------+---------------+---------+-----------+----------+-------+ Gastroc  Full                                                 +---------+---------------+---------+-----------+----------+-------+ GSV      Full           Yes      Yes                          +---------+---------------+---------+-----------+----------+-------+ SSV      Full           Yes      Yes                           +---------+---------------+---------+-----------+----------+-------+  Summary: Right: No evidence of common femoral vein obstruction. Left: No evidence of deep vein thrombosis in the lower extremity. No indirect evidence of obstruction proximal to the inguinal ligament. No cystic structure found in the popliteal fossa.  *See table(s) above for measurements and observations. Electronically signed by Quay Burow MD on 02/22/2019 at 4:45:33 PM.    Final     ASSESSMENT:   Carcinoma of upper-inner quadrant of right breast in female, estrogen receptor positive ([place]) # Stage I ER/PR positive HER-2/neu negative;  On Anastrazole. Stable. .   # No clinical evidence of recurrence.  Continue anastrozole at this time.  Tolerating without any major side effects.   # Left calf cramping-question etiology/ultrasound negative; reviewed PCP.  Improved/resolved.  # Osteopenia- BMD Nov 2018; T score= -.2. Continue calcium and vitamin D.  Stable.  Ordered with a mammogram.  # Chronic benign ethnic neutropenia /mild leucopenia- 32.6/ ANC-1.1- overall stable  # Grade 2 neuropathy- stable; Neurontin 300 mg TID.   # DISPOSITION:  # bil mammogram dec 2020/ bone density # follow up in 6 months-MD/labs- cbc/cmp-Dr.B.   No matching staging information was found for the patient.  Cammie Sickle, MD   03/12/2019 12:31 PM

## 2019-03-22 ENCOUNTER — Other Ambulatory Visit: Payer: Self-pay | Admitting: Internal Medicine

## 2019-03-22 DIAGNOSIS — C50211 Malignant neoplasm of upper-inner quadrant of right female breast: Secondary | ICD-10-CM

## 2019-03-22 DIAGNOSIS — Z95828 Presence of other vascular implants and grafts: Secondary | ICD-10-CM

## 2019-03-22 DIAGNOSIS — Z170 Estrogen receptor positive status [ER+]: Secondary | ICD-10-CM

## 2019-03-23 ENCOUNTER — Other Ambulatory Visit: Payer: Self-pay | Admitting: Internal Medicine

## 2019-05-03 ENCOUNTER — Encounter: Payer: Self-pay | Admitting: Internal Medicine

## 2019-05-03 ENCOUNTER — Other Ambulatory Visit: Payer: Self-pay

## 2019-05-03 ENCOUNTER — Ambulatory Visit (INDEPENDENT_AMBULATORY_CARE_PROVIDER_SITE_OTHER): Payer: Medicare Other | Admitting: Internal Medicine

## 2019-05-03 VITALS — BP 124/76 | HR 76 | Temp 98.5°F | Ht 61.5 in | Wt 123.0 lb

## 2019-05-03 DIAGNOSIS — T451X5A Adverse effect of antineoplastic and immunosuppressive drugs, initial encounter: Secondary | ICD-10-CM

## 2019-05-03 DIAGNOSIS — C50211 Malignant neoplasm of upper-inner quadrant of right female breast: Secondary | ICD-10-CM

## 2019-05-03 DIAGNOSIS — Z23 Encounter for immunization: Secondary | ICD-10-CM

## 2019-05-03 DIAGNOSIS — I10 Essential (primary) hypertension: Secondary | ICD-10-CM

## 2019-05-03 DIAGNOSIS — G620 Drug-induced polyneuropathy: Secondary | ICD-10-CM

## 2019-05-03 DIAGNOSIS — Z170 Estrogen receptor positive status [ER+]: Secondary | ICD-10-CM

## 2019-05-03 DIAGNOSIS — Z853 Personal history of malignant neoplasm of breast: Secondary | ICD-10-CM | POA: Diagnosis not present

## 2019-05-03 DIAGNOSIS — Z0000 Encounter for general adult medical examination without abnormal findings: Secondary | ICD-10-CM

## 2019-05-03 DIAGNOSIS — E7800 Pure hypercholesterolemia, unspecified: Secondary | ICD-10-CM | POA: Diagnosis not present

## 2019-05-03 LAB — COMPREHENSIVE METABOLIC PANEL
ALT: 15 U/L (ref 0–35)
AST: 21 U/L (ref 0–37)
Albumin: 4.1 g/dL (ref 3.5–5.2)
Alkaline Phosphatase: 50 U/L (ref 39–117)
BUN: 10 mg/dL (ref 6–23)
CO2: 27 mEq/L (ref 19–32)
Calcium: 9.5 mg/dL (ref 8.4–10.5)
Chloride: 105 mEq/L (ref 96–112)
Creatinine, Ser: 0.83 mg/dL (ref 0.40–1.20)
GFR: 81.26 mL/min (ref >60.00)
Glucose, Bld: 81 mg/dL (ref 70–99)
Potassium: 3.7 mEq/L (ref 3.5–5.1)
Sodium: 143 mEq/L (ref 135–145)
Total Bilirubin: 0.7 mg/dL (ref 0.2–1.2)
Total Protein: 7 g/dL (ref 6.0–8.3)

## 2019-05-03 LAB — CBC
HCT: 37.4 % (ref 36.0–46.0)
Hemoglobin: 12.5 g/dL (ref 12.0–15.0)
MCHC: 33.4 g/dL (ref 30.0–36.0)
MCV: 90.3 fl (ref 78.0–100.0)
Platelets: 285 10*3/uL (ref 150.0–400.0)
RBC: 4.15 Mil/uL (ref 3.87–5.11)
RDW: 12.8 % (ref 11.5–15.5)
WBC: 4.3 10*3/uL (ref 4.0–10.5)

## 2019-05-03 LAB — LIPID PANEL
Cholesterol: 169 mg/dL (ref 0–200)
HDL: 80.2 mg/dL (ref >39.00)
LDL Cholesterol: 82 mg/dL (ref 0–99)
NonHDL: 89.04
Total CHOL/HDL Ratio: 2
Triglycerides: 37 mg/dL (ref 0.0–149.0)
VLDL: 7.4 mg/dL (ref 0.0–40.0)

## 2019-05-03 LAB — VITAMIN D 25 HYDROXY (VIT D DEFICIENCY, FRACTURES): VITD: 82.46 ng/mL (ref 30.00–100.00)

## 2019-05-03 MED ORDER — GABAPENTIN 300 MG PO CAPS: 300.0000 mg | ORAL_CAPSULE | Freq: Three times a day (TID) | ORAL | 2 refills | Status: DC

## 2019-05-03 NOTE — Patient Instructions (Signed)

## 2019-05-03 NOTE — Assessment & Plan Note (Signed)
Stable on Gabapentin, refilled today

## 2019-05-03 NOTE — Assessment & Plan Note (Signed)
Controlled on Lisinopril CMET today Reinforced DASH diet 

## 2019-05-03 NOTE — Assessment & Plan Note (Signed)
In remission Continue Arimidex She will continue to follow with oncology

## 2019-05-03 NOTE — Progress Notes (Signed)
HPI:  Pt presents to the clinic today for her subsequent annual Medicare Wellness Exam.  HTN: Her BP today is 124/76. She is taking Amlodipine and Lisinopril as prescribed. ECG from 09/2018 reviewed.  HLD: Her last LDL was 79, 04/2018. She is not taking any cholesterol lowering medications at this time. She tries to consume a low fat diet.  History of Right Breast Cancer: s/p lumpectomy and chemo. 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 She is taking Gabapentin and Arimedex as prescribed. She follows with Dr. Janeth Rase.   Past Medical History:  Diagnosis Date  . Breast cancer ([place]) 07/13/2015   RIGHT lumpectomy 07/13/15, pT1c, pN0,M0 Stage 1 Er/PR pos, her 2 neg. MammaPrint High  . Hyperlipidemia   . Hypertension   . Neuropathy   . 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 Personal history of chemotherapy 2016  . Personal history of radiation therapy 2016  . Visual blurriness    SOMETHING NEW THAT HAS DEVELOPED SINCE 07-13-15 SURGERY- SEEN AT [facility] AND EXAM WAS NORMAL PER PT (08-09-15)    Current Outpatient Medications  Medication Sig Dispense Refill  . amLODipine (NORVASC) 5 MG tablet Take 1 tablet by mouth once daily 90 tablet 0  . anastrozole (ARIMIDEX) 1 MG tablet Take 1 tablet by mouth once daily 90 tablet 0  . b complex vitamins tablet Take 1 tablet by mouth daily.    Marland Kitchen BIOTIN PO Take 5,000 mcg by mouth daily.    . Calcium Carbonate-Vitamin D (CALCIUM 500 + D PO) Take 2 tablets by mouth daily.    . COD LIVER OIL/VITAMINS A & D CAPS Take by mouth.    . Flaxseed, Linseed, (FLAX SEEDS PO) Take by mouth daily.    Marland Kitchen gabapentin (NEURONTIN) 300 MG capsule TAKE 1 CAPSULE BY MOUTH THREE TIMES DAILY 90 capsule 1  . Garlic 123XX123 MG CAPS Take by mouth.    Marland Kitchen lisinopril (ZESTRIL) 10 MG tablet Take 1 tablet by mouth once daily 90 tablet 0  . methocarbamol (ROBAXIN) 500 MG tablet Take 0.5 tablets (250 mg total) by mouth at bedtime as needed for muscle spasms. (Patient not  taking: Reported on 03/12/2019) 10 tablet 0  . neomycin-polymyxin-hydrocortisone (CORTISPORIN) OTIC solution Place 3 drops into the right ear 4 (four) times daily. 10 mL 0  . PAPAYA ENZYMES PO Take by mouth daily.    . Potassium 99 MG TABS Take 99 mg by mouth daily.     . Turmeric 500 MG CAPS Take 1 capsule by mouth daily.     No current facility-administered medications for this visit.     Allergies  Allergen Reactions  . Cymbalta [Duloxetine Hcl] Other (See Comments)    Glaucoma.  Unable to take  . Lovastatin Anaphylaxis    Tongue swelling    Family History  Problem Relation Age of Onset  . Cancer Sister 63       breast cancer  . Breast cancer Sister 49  . Colon cancer Neg Hx     Social History   Socioeconomic History  . Marital status: Married    Spouse name: Not on file  . Number of children: Not on file  . Years of education: Not on file  . Highest education level: Not on file  Occupational History  . Not on file  Social Needs  . Financial resource strain: Not on file  . Food insecurity    Worry: Not on file    Inability: Not on  file  . Transportation needs    Medical: Not on file    Non-medical: Not on file  Tobacco Use  . Smoking status: Never Smoker  . Smokeless tobacco: Never Used  Substance and Sexual Activity  . Alcohol use: No    Alcohol/week: 0.0 standard drinks  . Drug use: No  . Sexual activity: Yes  Lifestyle  . Physical activity    Days per week: Not on file    Minutes per session: Not on file  . Stress: Not on file  Relationships  . Social Herbalist on phone: Not on file    Gets together: Not on file    Attends religious service: Not on file    Active member of club or organization: Not on file    Attends meetings of clubs or organizations: Not on file    Relationship status: Not on file  . Intimate partner violence    Fear of current or ex partner: Not on file    Emotionally abused: Not on file    Physically abused: Not on  file    Forced sexual activity: Not on file  Other Topics Concern  . Not on file  Social History Narrative   End of life wishes (updated 01/2013)-   Daughter of wishes (Cherlyl Duwayne Heck)      Desires CPR.   Desires life support if reasonable.          Hospitiliaztions: None  Health Maintenance:    Flu: 04/2018  Tetanus: 08/2018  Pneumovax: 01/2012  Prevnar: 04/2014  Zostavax: 01/2012  Shingrix: never  Mammogram: 07/2018  Pap Smear: 04/2015  Bone Density: 07/2017  Colon Screening: 06/2014  Eye Doctor: annually  Dental Exam: biannually   Providers:   PCP: Webb Silversmith, NP-C  Oncologist: Dr. Janeth Rase   I have personally reviewed and have noted:  1. The patient's medical and social history 2. Their use of alcohol, tobacco or illicit drugs 3. Their current medications and supplements 4. The patient's functional ability including ADL's, fall risks, home safety risks and hearing or visual impairment. 5. Diet and physical activities 6. Evidence for depression or mood disorder  Subjective:   Review of Systems:   Constitutional: Denies fever, malaise, fatigue, headache or abrupt weight changes.  HEENT: Denies eye pain, eye redness, ear pain, ringing in the ears, wax buildup, runny nose, nasal congestion, bloody nose, or sore throat. Respiratory: Denies difficulty breathing, shortness of breath, cough or sputum production.   Cardiovascular: Denies chest pain, chest tightness, palpitations or swelling in the hands or feet.  Gastrointestinal: Denies abdominal pain, bloating, constipation, diarrhea or blood in the stool.  GU: Denies urgency, frequency, pain with urination, burning sensation, blood in urine, odor or discharge. Musculoskeletal: Denies decrease in range of motion, difficulty with gait, muscle pain or joint pain and swelling.  Skin: Denies redness, rashes, lesions or ulcercations.  Neurological: Pt reports burning sensation in feet. Denies dizziness, difficulty  with memory, difficulty with speech or problems with balance and coordination.  Psych: Denies anxiety, depression, SI/HI.  No other specific complaints in a complete review of systems (except as listed in HPI above).  Objective:  PE:   BP 124/76   Pulse 76   Temp 98.5 F (36.9 C) (Temporal)   Ht 5' 1.5" (1.562 m)   Wt 123 lb (55.8 kg)   LMP  (LMP Unknown)   SpO2 98%   BMI 22.86 kg/m   Wt Readings from Last 3 Encounters:  03/12/19  123 lb (55.8 kg)  02/19/19 123 lb (55.8 kg)  09/01/18 125 lb (56.7 kg)    General: Appears her stated age, well developed, well nourished in NAD. Skin: Warm, dry and intact. No rashenoted. HEENT: Head: normal shape and size; Eyes: sclera white, no icterus, conjunctiva pink, PERRLA and EOMs intact; Ears: Tm's gray and intact, normal light reflex;  Neck: Neck supple, trachea midline. No masses, lumps or thyromegaly present.  Cardiovascular: Normal rate and rhythm. S1,S2 noted.  No murmur, rubs or gallops noted. No JVD or BLE edema. No carotid bruits noted. Pulmonary/Chest: Normal effort and positive vesicular breath sounds. No respiratory distress. No wheezes, rales or ronchi noted.  Abdomen: Soft and nontender. Normal bowel sounds. No distention or masses noted. Liver, spleen and kidneys non palpable. Musculoskeletal:  Strength 5/5 BUE/BLE. No signs of joint swelling.  Neurological: Alert and oriented. Cranial nerves II-XII grossly intact. Coordination normal.  Psychiatric: Mood and affect normal. Behavior is normal. Judgment and thought content normal.   BMET    Component Value Date/Time   NA 140 03/12/2019 0959   K 4.1 03/12/2019 0959   K 4.0 02/24/2012 0743   CL 106 03/12/2019 0959   CO2 26 03/12/2019 0959   GLUCOSE 110 (H) 03/12/2019 0959   BUN 13 03/12/2019 0959   CREATININE 0.95 03/12/2019 0959   CREATININE 0.83 02/19/2019 1452   CALCIUM 9.1 03/12/2019 0959   GFRNONAA 59 (L) 03/12/2019 0959   GFRAA >60 03/12/2019 0959    Lipid Panel      Component Value Date/Time   CHOL 164 04/28/2018 0957   TRIG 37.0 04/28/2018 0957   HDL 77.50 04/28/2018 0957   CHOLHDL 2 04/28/2018 0957   VLDL 7.4 04/28/2018 0957   LDLCALC 79 04/28/2018 0957    CBC    Component Value Date/Time   WBC 2.6 (L) 03/12/2019 0959   RBC 4.28 03/12/2019 0959   HGB 12.6 03/12/2019 0959   HGB 13.4 11/08/2014 1059   HCT 38.2 03/12/2019 0959   HCT 39.8 11/08/2014 1059   PLT 292 03/12/2019 0959   PLT 318 11/08/2014 1059   MCV 89.3 03/12/2019 0959   MCV 88 11/08/2014 1059   MCH 29.4 03/12/2019 0959   MCHC 33.0 03/12/2019 0959   RDW 12.3 03/12/2019 0959   RDW 12.7 11/08/2014 1059   LYMPHSABS 1.0 03/12/2019 0959   LYMPHSABS 1.3 11/08/2014 1059   MONOABS 0.2 03/12/2019 0959   MONOABS 0.3 11/08/2014 1059   EOSABS 0.2 03/12/2019 0959   EOSABS 0.2 11/08/2014 1059   BASOSABS 0.1 03/12/2019 0959   BASOSABS 0.0 11/08/2014 1059    Hgb A1C Lab Results  Component Value Date   HGBA1C 5.1 07/26/2015      Assessment and Plan:   Medicare Annual Wellness Visit:  Diet:  She does only seafood. She consumes fruits and veggies. She tries to avoid fried foods. She drinks mostly water. Physical activity: Walking 30 minutes daily. Depression/mood screen: Negative, PHQ 9 score of 0 Hearing: Intact to whispered voice Visual acuity: Grossly normal, performs annual eye exam  ADLs: Capable Fall risk: None Home safety: Good Cognitive evaluation: Intact to orientation, naming, recall and repetition EOL planning:Live Will, full code/ I agree  Preventative Medicine: Flu shot today. Tetanus, pneumovax, prevnar and zostovax UTD. She declines shingrix. She no longer needs pap smears. Mammogram and bone density scheduled. Colon screening UTD. Encouraged her to consume a balanced diet and exercise regimen. Advised her to see an eye doctor and dentist annually. Will check  CBC, CMET, Lipid and Vit D today. Due dates for screening exams given to pt as par of her  AVS.   Next appointment: 1 year, Medicare Wellness Exam   Webb Silversmith, NP

## 2019-05-03 NOTE — Assessment & Plan Note (Signed)
CMET and Lipid profile today Encouraged her to consume a low fat diet 

## 2019-05-09 ENCOUNTER — Other Ambulatory Visit: Payer: Self-pay | Admitting: Internal Medicine

## 2019-06-18 ENCOUNTER — Other Ambulatory Visit: Payer: Self-pay | Admitting: Internal Medicine

## 2019-06-18 DIAGNOSIS — Z95828 Presence of other vascular implants and grafts: Secondary | ICD-10-CM

## 2019-06-18 DIAGNOSIS — Z170 Estrogen receptor positive status [ER+]: Secondary | ICD-10-CM

## 2019-06-18 DIAGNOSIS — C50211 Malignant neoplasm of upper-inner quadrant of right female breast: Secondary | ICD-10-CM

## 2019-06-19 ENCOUNTER — Other Ambulatory Visit: Payer: Self-pay | Admitting: Internal Medicine

## 2019-06-19 DIAGNOSIS — C50211 Malignant neoplasm of upper-inner quadrant of right female breast: Secondary | ICD-10-CM

## 2019-06-19 DIAGNOSIS — Z170 Estrogen receptor positive status [ER+]: Secondary | ICD-10-CM

## 2019-06-19 DIAGNOSIS — Z95828 Presence of other vascular implants and grafts: Secondary | ICD-10-CM

## 2019-07-19 ENCOUNTER — Ambulatory Visit
Admission: RE | Admit: 2019-07-19 | Discharge: 2019-07-19 | Disposition: A | Discharge status: Home / Self Care | Payer: Medicare Other | Source: Ambulatory Visit | Attending: Internal Medicine | Admitting: Internal Medicine

## 2019-07-19 DIAGNOSIS — Z79811 Long term (current) use of aromatase inhibitors: Secondary | ICD-10-CM | POA: Diagnosis present

## 2019-07-19 DIAGNOSIS — C50211 Malignant neoplasm of upper-inner quadrant of right female breast: Secondary | ICD-10-CM | POA: Diagnosis not present

## 2019-07-19 DIAGNOSIS — Z170 Estrogen receptor positive status [ER+]: Secondary | ICD-10-CM | POA: Insufficient documentation

## 2019-07-28 ENCOUNTER — Encounter: Payer: Self-pay | Admitting: Surgery

## 2019-07-28 ENCOUNTER — Other Ambulatory Visit: Payer: Self-pay

## 2019-07-28 ENCOUNTER — Ambulatory Visit (INDEPENDENT_AMBULATORY_CARE_PROVIDER_SITE_OTHER): Payer: Medicare Other | Admitting: Surgery

## 2019-07-28 VITALS — BP 151/79 | HR 71 | Temp 97.7°F | Ht 61.5 in | Wt 125.0 lb

## 2019-07-28 DIAGNOSIS — C50211 Malignant neoplasm of upper-inner quadrant of right female breast: Secondary | ICD-10-CM | POA: Diagnosis not present

## 2019-07-28 DIAGNOSIS — Z170 Estrogen receptor positive status [ER+]: Secondary | ICD-10-CM | POA: Diagnosis not present

## 2019-07-28 NOTE — Patient Instructions (Addendum)
The patient has been asked to return to the office in one year with a bilateral diagnostic mammogram.Continue self breast exams. Call office for any new breast issues or concerns.  

## 2019-07-30 NOTE — Progress Notes (Signed)
Outpatient Surgical Follow Up  07/30/2019  Sandra [name] is an 73 y.o. female.   Chief Complaint  Patient presents with  . Follow-up    Breast Cancer    HPI: Tere is a 73 year old female with history of breast cancer on the right side requiring right lumpectomy and sentinel lymph node biopsy by Dr. Jamal Collin in 2016 underwent adjuvant chemo and radiation therapy.  She did have also an excisional biopsy of the left breast mass at the same setting showing benign disease.  She is here for yearly follow-up.  I have personally reviewed her mammogram showing no evidence of any concerning new lesions on either breast.  She denies any fevers, no chills no weight loss.  She also denies any breast issues no breast or nipple discharge and no evidence of lymphadenopathy.  Past Medical History:  Diagnosis Date  . Breast cancer ([place]) 07/13/2015   RIGHT lumpectomy 07/13/15, pT1c, pN0,M0 Stage 1 Er/PR pos, her 2 neg. MammaPrint High  . Hyperlipidemia   . Hypertension   . Neuropathy   . 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 Personal history of chemotherapy 2016  . Personal history of radiation therapy 2016  . Visual blurriness    SOMETHING NEW THAT HAS DEVELOPED SINCE 07-13-15 SURGERY- SEEN AT [facility] AND EXAM WAS NORMAL PER PT (08-09-15)    Past Surgical History:  Procedure Laterality Date  . ABDOMINAL HYSTERECTOMY  1987  . BREAST BIOPSY Right    neg  . BREAST BIOPSY Left    4 oclock, FIBROEPITHELIAL LESION FAVOR CELLULAR FIBROADENOMA  . BREAST BIOPSY Left    2 oclock, CYSTIC APOCRINE METAPLASIA  . BREAST BIOPSY Right 07/13/15   130 INVASIVE MAMMARY CARCINOMA, NO SPECIAL TYPE  . BREAST EXCISIONAL BIOPSY Left 2016   fibroadenoma  . BREAST LUMPECTOMY Left 07/13/2015  . BREAST LUMPECTOMY WITH SENTINEL LYMPH NODE BIOPSY Right 07/13/2015   Procedure: BREAST LUMPECTOMY WITH SENTINEL LYMPH NODE BX;  Surgeon: Christene Lye, MD;  Location: [facility] ORS;  Service:  General;  Laterality: Right;  . BUNIONECTOMY Right 1993  . CATARACT EXTRACTION W/PHACO Left 09/17/2016   Procedure: CATARACT EXTRACTION PHACO AND INTRAOCULAR LENS PLACEMENT (IOC);  Surgeon: Birder Robson, MD;  Location: [facility] ORS;  Service: Ophthalmology;  Laterality: Left;  [place] 00:47AP% 17.6CDE 8.39Fluid pack lot # [id number]  . CATARACT EXTRACTION W/PHACO Right 01/21/2017   Procedure: CATARACT EXTRACTION PHACO AND INTRAOCULAR LENS PLACEMENT (IOC);  Surgeon: Birder Robson, MD;  Location: [facility] ORS;  Service: Ophthalmology;  Laterality: Right;  [place] 00:39 AP% 13.0 CDE 5.16 fluid pack lot # [id number]  . COLONOSCOPY  2015  . fatty tumor Left 2013   side  . PORT-A-CATH REMOVAL  2017  . PORTACATH PLACEMENT Left 08/16/2015   Procedure: INSERTION PORT-A-CATH;  Surgeon: Christene Lye, MD;  Location: [facility] ORS;  Service: General;  Laterality: Left;    Family History  Problem Relation Age of Onset  . Cancer Sister 99       breast cancer  . Breast cancer Sister 76  . Colon cancer Neg Hx     Social History:  reports that she has never smoked. She has never used smokeless tobacco. She reports that she does not drink alcohol or use drugs.  Allergies:  Allergies  Allergen Reactions  . Cymbalta [Duloxetine Hcl] Other (See Comments)    Glaucoma.  Unable to take  . Lovastatin Anaphylaxis    Tongue swelling    Medications reviewed.  ROS Full ROS performed and is otherwise negative other than what is stated in HPI   BP (!) 151/79   Pulse 71   Temp 97.7 F (36.5 C)   Ht 5' 1.5" (1.562 m)   Wt 125 lb (56.7 kg)   LMP  (LMP Unknown)   SpO2 98%   BMI 23.24 kg/m   Physical Exam Vitals and nursing note reviewed. Exam conducted with a chaperone present.  Constitutional:      General: She is not in acute distress.    Appearance: Normal appearance. She is normal weight.  Eyes:     General: No scleral icterus.       Right eye: No discharge.        Left eye: No discharge.   Cardiovascular:     Rate and Rhythm: Normal rate and regular rhythm.     Pulses: Normal pulses.     Heart sounds: No murmur.  Pulmonary:     Effort: Pulmonary effort is normal. No respiratory distress.     Breath sounds: Normal breath sounds. No stridor. No rhonchi.     Comments: BREAST: Prior bilateral lumpectomy scars.  Sentinel lymph node biopsy around the right axilla.  No evidence of new concerning lesions on either breast no evidence of any lymphadenopathy. Abdominal:     General: Abdomen is flat. There is no distension.     Palpations: There is no mass.     Tenderness: There is no abdominal tenderness. There is no guarding.     Hernia: No hernia is present.  Musculoskeletal:     Cervical back: Normal range of motion and neck supple. No rigidity or tenderness.  Lymphadenopathy:     Cervical: No cervical adenopathy.  Skin:    General: Skin is warm and dry.     Capillary Refill: Capillary refill takes less than 2 seconds.  Neurological:     General: No focal deficit present.     Mental Status: She is alert and oriented to person, place, and time.  Psychiatric:        Mood and Affect: Mood normal.        Behavior: Behavior normal.        Thought Content: Thought content normal.        Judgment: Judgment normal.     Assessment/Plan: 73 year old female with a history of breast cancer.  We will follow her up in 1 year with mammogram and mammography.  No concerning lesions at this time.  Greater than 50% of the 25 minutes  visit was spent in counseling/coordination of care   Caroleen Hamman, MD [place] Surgeon

## 2019-08-05 ENCOUNTER — Telehealth: Payer: Self-pay | Admitting: *Deleted

## 2019-08-05 NOTE — Telephone Encounter (Signed)
Sandra [name]- pt is ok to get covid vaccine when available. Thx

## 2019-08-05 NOTE — Telephone Encounter (Signed)
Patient informed via call to husband that ok to get vaccine

## 2019-08-05 NOTE — Telephone Encounter (Signed)
Patient called asking if she can get the COVID vaccine. Please advise

## 2019-09-10 ENCOUNTER — Other Ambulatory Visit: Payer: Self-pay

## 2019-09-13 ENCOUNTER — Other Ambulatory Visit: Payer: Self-pay

## 2019-09-13 ENCOUNTER — Inpatient Hospital Stay (HOSPITAL_BASED_OUTPATIENT_CLINIC_OR_DEPARTMENT_OTHER): Payer: Medicare PPO | Admitting: Internal Medicine

## 2019-09-13 ENCOUNTER — Inpatient Hospital Stay: Payer: Medicare PPO | Attending: Internal Medicine

## 2019-09-13 DIAGNOSIS — Z170 Estrogen receptor positive status [ER+]: Secondary | ICD-10-CM

## 2019-09-13 DIAGNOSIS — R202 Paresthesia of skin: Secondary | ICD-10-CM | POA: Insufficient documentation

## 2019-09-13 DIAGNOSIS — Z79811 Long term (current) use of aromatase inhibitors: Secondary | ICD-10-CM | POA: Insufficient documentation

## 2019-09-13 DIAGNOSIS — Z9221 Personal history of antineoplastic chemotherapy: Secondary | ICD-10-CM | POA: Insufficient documentation

## 2019-09-13 DIAGNOSIS — C50211 Malignant neoplasm of upper-inner quadrant of right female breast: Secondary | ICD-10-CM

## 2019-09-13 DIAGNOSIS — Z90710 Acquired absence of both cervix and uterus: Secondary | ICD-10-CM | POA: Insufficient documentation

## 2019-09-13 DIAGNOSIS — Z803 Family history of malignant neoplasm of breast: Secondary | ICD-10-CM | POA: Insufficient documentation

## 2019-09-13 DIAGNOSIS — R200 Anesthesia of skin: Secondary | ICD-10-CM | POA: Insufficient documentation

## 2019-09-13 DIAGNOSIS — M8580 Other specified disorders of bone density and structure, unspecified site: Secondary | ICD-10-CM | POA: Diagnosis not present

## 2019-09-13 LAB — COMPREHENSIVE METABOLIC PANEL
ALT: 21 U/L (ref 0–44)
AST: 23 U/L (ref 15–41)
Albumin: 4.1 g/dL (ref 3.5–5.0)
Alkaline Phosphatase: 53 U/L (ref 38–126)
Anion gap: 9 (ref 5–15)
BUN: 13 mg/dL (ref 8–23)
CO2: 26 mmol/L (ref 22–32)
Calcium: 9.1 mg/dL (ref 8.9–10.3)
Chloride: 103 mmol/L (ref 98–111)
Creatinine, Ser: 0.77 mg/dL (ref 0.44–1.00)
GFR calc Af Amer: >60 mL/min (ref >60)
GFR calc non Af Amer: >60 mL/min (ref >60)
Glucose, Bld: 102 mg/dL — ABNORMAL HIGH (ref 70–99)
Potassium: 3.9 mmol/L (ref 3.5–5.1)
Sodium: 138 mmol/L (ref 135–145)
Total Bilirubin: 0.7 mg/dL (ref 0.3–1.2)
Total Protein: 7 g/dL (ref 6.5–8.1)

## 2019-09-13 LAB — CBC WITH DIFFERENTIAL/PLATELET
Abs Immature Granulocytes: 0.01 10*3/uL (ref 0.00–0.07)
Basophils Absolute: 0 10*3/uL (ref 0.0–0.1)
Basophils Relative: 1 %
Eosinophils Absolute: 0.2 10*3/uL (ref 0.0–0.5)
Eosinophils Relative: 7 %
HCT: 39.3 % (ref 36.0–46.0)
Hemoglobin: 12.5 g/dL (ref 12.0–15.0)
Immature Granulocytes: 0 %
Lymphocytes Relative: 34 %
Lymphs Abs: 0.9 10*3/uL (ref 0.7–4.0)
MCH: 29.4 pg (ref 26.0–34.0)
MCHC: 31.8 g/dL (ref 30.0–36.0)
MCV: 92.5 fL (ref 80.0–100.0)
Monocytes Absolute: 0.2 10*3/uL (ref 0.1–1.0)
Monocytes Relative: 7 %
Neutro Abs: 1.3 10*3/uL — ABNORMAL LOW (ref 1.7–7.7)
Neutrophils Relative %: 51 %
Platelets: 305 10*3/uL (ref 150–400)
RBC: 4.25 MIL/uL (ref 3.87–5.11)
RDW: 12.1 % (ref 11.5–15.5)
WBC: 2.6 10*3/uL — ABNORMAL LOW (ref 4.0–10.5)
nRBC: 0 % (ref 0.0–0.2)

## 2019-09-13 NOTE — Progress Notes (Signed)
[place] @ [facility] [phone]  [phone]   INITIAL CONSULT  Sandra [name] OB: 05/13/1946  MR#: [id number]  UVO#:[id number]  Patient Care Team: Jearld Fenton, NP as PCP - General (Internal Medicine) Glennie Isle, PA-C (Physician Assistant) Pyrtle, Lajuan Lines, MD as Consulting Physician (Gastroenterology) Lucille Passy, MD as Consulting Physician (Family Medicine) Christene Lye, MD (General Surgery) Cammie Sickle, MD as Consulting Physician (Internal Medicine) Leona Singleton, RN as Oncology Nurse Navigator Noreene Filbert, MD as Referring Physician (Radiation Oncology)  CHIEF COMPLAINT:  No chief complaint on file.  VISIT DIAGNOSIS:     ICD-10-CM   1. Carcinoma of upper-inner quadrant of right breast in female, estrogen receptor positive ([place])  C50.211 CBC with Differential   Z17.0 Comprehensive metabolic panel     Oncology History Overview Note   carcinoma breast (right) inner and upper quadrant T1 cN0 M0 tumor Estrogen receptor positive progesterone receptor positive HER-2 receptor negative by fish Mammo print shows high risk (diagnosis in January of 2016) patient has 22% average risk in 5 years and 29% average risk in 10 years for distant metastectomy disease without chemotherapy on anti-hormonal treatment 2.  Patient started Cytoxan and Adriamycin on now August 30, 2015 MUGA scan shows ejection fraction of baseline 55%.  3.Patient has finished total of 4 cycles of chemotherapy on November 01, 2015 4.  Started on the Taxol from November 22, 2015; FINISHED RT [sep 2017] # OCT 2017- START ARIMIDEX   # NOV 2021 BMD- T score= - 1.9 -OSTEOPENIA- ca+vit D  # SURVIVORSHIP: O  --------------------------------------------------------   DIAGNOSIS: RIGHT BREAST CA  STAGE: I        ;GOALS: cure  CURRENT/MOST RECENT THERAPY: on anastrazole      Carcinoma of upper-inner quadrant of right breast in female, estrogen receptor  positive ([place])    INTERVAL HISTORY: 74 year old lady History of stage I right-sided breast cancer ER/PR positive currently on Arimidex is here for follow-up.  Patient denies any new lumps or bumps.  Chronic mild tingling and numbness in the feet.  Not any worse.  She continues to be on gabapentin.  Appetite is good.  No weight loss but no bone pain.  Review of Systems  Constitutional: Negative for chills, diaphoresis, fever, malaise/fatigue and weight loss.  HENT: Negative for nosebleeds and sore throat.   Eyes: Negative for double vision.  Respiratory: Negative for cough, hemoptysis, sputum production, shortness of breath and wheezing.   Cardiovascular: Negative for chest pain, palpitations, orthopnea and leg swelling.  Gastrointestinal: Negative for abdominal pain, blood in stool, constipation, diarrhea, heartburn, melena, nausea and vomiting.  Genitourinary: Negative for dysuria, frequency and urgency.  Musculoskeletal: Negative for back pain and joint pain.  Skin: Negative.  Negative for itching and rash.  Neurological: Positive for tingling. Negative for dizziness, focal weakness, weakness and headaches.  Endo/Heme/Allergies: Does not bruise/bleed easily.  Psychiatric/Behavioral: Negative for depression. The patient is not nervous/anxious and does not have insomnia.      PAST MEDICAL HISTORY: Past Medical History:  Diagnosis Date  . Breast cancer ([place]) 07/13/2015   RIGHT lumpectomy 07/13/15, pT1c, pN0,M0 Stage 1 Er/PR pos, her 2 neg. MammaPrint High  . Hyperlipidemia   . Hypertension   . Neuropathy   . 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 Personal history of chemotherapy 2016  . Personal history of radiation therapy 2016  . Visual blurriness    SOMETHING NEW THAT HAS DEVELOPED SINCE  07-13-15 SURGERY- SEEN AT [facility] AND EXAM WAS NORMAL PER PT (08-09-15)    PAST SURGICAL HISTORY: Past Surgical History:  Procedure Laterality Date  . ABDOMINAL  HYSTERECTOMY  1987  . BREAST BIOPSY Right    neg  . BREAST BIOPSY Left    4 oclock, FIBROEPITHELIAL LESION FAVOR CELLULAR FIBROADENOMA  . BREAST BIOPSY Left    2 oclock, CYSTIC APOCRINE METAPLASIA  . BREAST BIOPSY Right 07/13/15   130 INVASIVE MAMMARY CARCINOMA, NO SPECIAL TYPE  . BREAST EXCISIONAL BIOPSY Left 2016   fibroadenoma  . BREAST LUMPECTOMY Left 07/13/2015  . BREAST LUMPECTOMY WITH SENTINEL LYMPH NODE BIOPSY Right 07/13/2015   Procedure: BREAST LUMPECTOMY WITH SENTINEL LYMPH NODE BX;  Surgeon: Christene Lye, MD;  Location: [facility] ORS;  Service: General;  Laterality: Right;  . BUNIONECTOMY Right 1993  . CATARACT EXTRACTION W/PHACO Left 09/17/2016   Procedure: CATARACT EXTRACTION PHACO AND INTRAOCULAR LENS PLACEMENT (IOC);  Surgeon: Birder Robson, MD;  Location: [facility] ORS;  Service: Ophthalmology;  Laterality: Left;  [place] 00:47AP% 17.6CDE 8.39Fluid pack lot # [id number]  . CATARACT EXTRACTION W/PHACO Right 01/21/2017   Procedure: CATARACT EXTRACTION PHACO AND INTRAOCULAR LENS PLACEMENT (IOC);  Surgeon: Birder Robson, MD;  Location: [facility] ORS;  Service: Ophthalmology;  Laterality: Right;  [place] 00:39 AP% 13.0 CDE 5.16 fluid pack lot # [id number]  . COLONOSCOPY  2015  . fatty tumor Left 2013   side  . PORT-A-CATH REMOVAL  2017  . PORTACATH PLACEMENT Left 08/16/2015   Procedure: INSERTION PORT-A-CATH;  Surgeon: Christene Lye, MD;  Location: [facility] ORS;  Service: General;  Laterality: Left;    FAMILY HISTORY Family History  Problem Relation Age of Onset  . Cancer Sister 29       breast cancer  . Breast cancer Sister 86  . Colon cancer Neg Hx         ADVANCED DIRECTIVES:   Patient does not have any living will or healthcare power of attorney.  Information was given .  Available resources had been discussed.  We will follow-up on subsequent appointments regarding this issue HEALTH MAINTENANCE: Social History   Tobacco Use  . Smoking status: Never Smoker  .  Smokeless tobacco: Never Used  Substance Use Topics  . Alcohol use: No    Alcohol/week: 0.0 standard drinks  . Drug use: No      :  Allergies  Allergen Reactions  . Cymbalta [Duloxetine Hcl] Other (See Comments)    Glaucoma.  Unable to take  . Lovastatin Anaphylaxis    Tongue swelling    Current Outpatient Medications  Medication Sig Dispense Refill  . amLODipine (NORVASC) 5 MG tablet Take 1 tablet by mouth once daily 90 tablet 2  . anastrozole (ARIMIDEX) 1 MG tablet Take 1 tablet by mouth once daily 90 tablet 0  . b complex vitamins tablet Take 1 tablet by mouth daily.    Marland Kitchen BIOTIN PO Take 5,000 mcg by mouth daily.    . Calcium Carbonate-Vitamin D (CALCIUM 500 + D PO) Take 2 tablets by mouth daily.    . COD LIVER OIL/VITAMINS A & D CAPS Take by mouth.    . Flaxseed, Linseed, (FLAX SEEDS PO) Take by mouth daily.    Marland Kitchen gabapentin (NEURONTIN) 300 MG capsule Take 1 capsule (300 mg total) by mouth 3 (three) times daily. 270 capsule 2  . Garlic 2409 MG CAPS Take by mouth.    Marland Kitchen lisinopril (ZESTRIL) 10 MG tablet Take 1 tablet by mouth once  daily 90 tablet 2  . PAPAYA ENZYMES PO Take by mouth daily.    . Potassium 99 MG TABS Take 99 mg by mouth daily.     . Turmeric 500 MG CAPS Take 1 capsule by mouth daily.     No current facility-administered medications for this visit.    OBJECTIVE: Physical Exam  Constitutional: She is oriented to person, place, and time and well-developed, well-nourished, and in no distress.  HENT:  Head: Normocephalic and atraumatic.  Mouth/Throat: Oropharynx is clear and moist. No oropharyngeal exudate.  Eyes: Pupils are equal, round, and reactive to light.  Cardiovascular: Normal rate and regular rhythm.  Pulmonary/Chest: No respiratory distress. She has no wheezes.  Abdominal: Soft. Bowel sounds are normal. She exhibits no distension and no mass. There is no abdominal tenderness. There is no rebound and no guarding.  Musculoskeletal:        General: No  tenderness or edema. Normal range of motion.     Cervical back: Normal range of motion and neck supple.  Neurological: She is alert and oriented to person, place, and time.  Skin: Skin is warm.  Right and left BREAST exam (in the presence of nurse)- no unusual skin changes or dominant masses felt. Surgical scars noted.    Psychiatric: Affect normal.      Vitals:   09/13/19 0928  BP: (!) 143/89  Pulse: 70  Resp: 20  Temp: (!) 95.1 F (35.1 C)     Body mass index is 23.24 kg/m.    ECOG FS:0 - Asymptomatic  LAB RESULTS:  Appointment on 09/13/2019  Component Date Value Ref Range Status  . Sodium 09/13/2019 138  135 - 145 mmol/L Final  . Potassium 09/13/2019 3.9  3.5 - 5.1 mmol/L Final  . Chloride 09/13/2019 103  98 - 111 mmol/L Final  . CO2 09/13/2019 26  22 - 32 mmol/L Final  . Glucose, Bld 09/13/2019 102* 70 - 99 mg/dL Final  . BUN 09/13/2019 13  8 - 23 mg/dL Final  . Creatinine, Ser 09/13/2019 0.77  0.44 - 1.00 mg/dL Final  . Calcium 09/13/2019 9.1  8.9 - 10.3 mg/dL Final  . Total Protein 09/13/2019 7.0  6.5 - 8.1 g/dL Final  . Albumin 09/13/2019 4.1  3.5 - 5.0 g/dL Final  . AST 09/13/2019 23  15 - 41 U/L Final  . ALT 09/13/2019 21  0 - 44 U/L Final  . Alkaline Phosphatase 09/13/2019 53  38 - 126 U/L Final  . Total Bilirubin 09/13/2019 0.7  0.3 - 1.2 mg/dL Final  . GFR calc non Af Amer 09/13/2019 >60  >60 mL/min Final  . GFR calc Af Amer 09/13/2019 >60  >60 mL/min Final  . Anion gap 09/13/2019 9  5 - 15 Final   Performed at [facility], [address] [place], [place] [address]  . WBC 09/13/2019 2.6* 4.0 - 10.5 K/uL Final  . RBC 09/13/2019 4.25  3.87 - 5.11 MIL/uL Final  . Hemoglobin 09/13/2019 12.5  12.0 - 15.0 g/dL Final  . HCT 09/13/2019 39.3  36.0 - 46.0 % Final  . MCV 09/13/2019 92.5  80.0 - 100.0 fL Final  . MCH 09/13/2019 29.4  26.0 - 34.0 pg Final  . MCHC 09/13/2019 31.8  30.0 - 36.0 g/dL Final  . RDW 09/13/2019 12.1  11.5 - 15.5 % Final  . Platelets  09/13/2019 305  150 - 400 K/uL Final  . nRBC 09/13/2019 0.0  0.0 - 0.2 % Final  .  Neutrophils Relative % 09/13/2019 51  % Final  . Neutro Abs 09/13/2019 1.3* 1.7 - 7.7 K/uL Final  . Lymphocytes Relative 09/13/2019 34  % Final  . Lymphs Abs 09/13/2019 0.9  0.7 - 4.0 K/uL Final  . Monocytes Relative 09/13/2019 7  % Final  . Monocytes Absolute 09/13/2019 0.2  0.1 - 1.0 K/uL Final  . Eosinophils Relative 09/13/2019 7  % Final  . Eosinophils Absolute 09/13/2019 0.2  0.0 - 0.5 K/uL Final  . Basophils Relative 09/13/2019 1  % Final  . Basophils Absolute 09/13/2019 0.0  0.0 - 0.1 K/uL Final  . Immature Granulocytes 09/13/2019 0  % Final  . Abs Immature Granulocytes 09/13/2019 0.01  0.00 - 0.07 K/uL Final   Performed at [facility], [address] [place], [place] [address]     STUDIES: No results found.  ASSESSMENT:   Carcinoma of upper-inner quadrant of right breast in female, estrogen receptor positive ([place]) # Stage I ER/PR positive HER-2/neu negative;  On Anastrazole. STABLE.   # No clinical evidence of recurrence.  Continue anastrozole at this time.  Tolerating without any major side effects.   # Osteopenia- BMD Nov 2021; T score= -.1.9; STABLE; . Continue calcium and vitamin D; reviewed the BMD.   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   # DISPOSITION:  # follow up in 6 months-MD/labs- cbc/cmp-Dr.B.   No matching staging information was found for the patient.  Cammie Sickle, MD   09/13/2019 12:50 PM

## 2019-09-13 NOTE — Assessment & Plan Note (Addendum)
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 STABLE.   # No clinical evidence of recurrence.  Continue anastrozole at this time.  Tolerating without any major side effects.   # Osteopenia- BMD Nov 2021; T score= -.1.9; STABLE; . Continue calcium and vitamin D; reviewed the BMD.   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   # DISPOSITION:  # follow up in 6 months-MD/labs- cbc/cmp-Dr.B.

## 2019-09-18 ENCOUNTER — Other Ambulatory Visit: Payer: Self-pay | Admitting: Internal Medicine

## 2019-09-18 DIAGNOSIS — Z95828 Presence of other vascular implants and grafts: Secondary | ICD-10-CM

## 2019-09-18 DIAGNOSIS — C50211 Malignant neoplasm of upper-inner quadrant of right female breast: Secondary | ICD-10-CM

## 2020-02-04 ENCOUNTER — Other Ambulatory Visit: Payer: Self-pay | Admitting: Internal Medicine

## 2020-02-04 DIAGNOSIS — G620 Drug-induced polyneuropathy: Secondary | ICD-10-CM

## 2020-02-11 ENCOUNTER — Other Ambulatory Visit: Payer: Self-pay

## 2020-02-11 ENCOUNTER — Ambulatory Visit: Payer: Medicare PPO | Admitting: Family Medicine

## 2020-02-11 ENCOUNTER — Encounter: Payer: Self-pay | Admitting: Family Medicine

## 2020-02-11 VITALS — BP 146/78 | HR 69 | Temp 97.5°F | Wt 125.2 lb

## 2020-02-11 DIAGNOSIS — I10 Essential (primary) hypertension: Secondary | ICD-10-CM | POA: Diagnosis not present

## 2020-02-11 LAB — CBC WITH DIFFERENTIAL/PLATELET
Basophils Absolute: 0 10*3/uL (ref 0.0–0.1)
Basophils Relative: 1 % (ref 0.0–3.0)
Eosinophils Absolute: 0.1 10*3/uL (ref 0.0–0.7)
Eosinophils Relative: 4.3 % (ref 0.0–5.0)
HCT: 37.8 % (ref 36.0–46.0)
Hemoglobin: 12.6 g/dL (ref 12.0–15.0)
Lymphocytes Relative: 44.2 % (ref 12.0–46.0)
Lymphs Abs: 1.2 10*3/uL (ref 0.7–4.0)
MCHC: 33.4 g/dL (ref 30.0–36.0)
MCV: 90.1 fl (ref 78.0–100.0)
Monocytes Absolute: 0.2 10*3/uL (ref 0.1–1.0)
Monocytes Relative: 9.1 % (ref 3.0–12.0)
Neutro Abs: 1.1 10*3/uL — ABNORMAL LOW (ref 1.4–7.7)
Neutrophils Relative %: 41.4 % — ABNORMAL LOW (ref 43.0–77.0)
Platelets: 280 10*3/uL (ref 150.0–400.0)
RBC: 4.2 Mil/uL (ref 3.87–5.11)
RDW: 13.1 % (ref 11.5–15.5)
WBC: 2.6 10*3/uL — ABNORMAL LOW (ref 4.0–10.5)

## 2020-02-11 LAB — COMPREHENSIVE METABOLIC PANEL
ALT: 15 U/L (ref 0–35)
AST: 18 U/L (ref 0–37)
Albumin: 4.3 g/dL (ref 3.5–5.2)
Alkaline Phosphatase: 51 U/L (ref 39–117)
BUN: 13 mg/dL (ref 6–23)
CO2: 30 mEq/L (ref 19–32)
Calcium: 9.4 mg/dL (ref 8.4–10.5)
Chloride: 105 mEq/L (ref 96–112)
Creatinine, Ser: 0.83 mg/dL (ref 0.40–1.20)
GFR: 81.08 mL/min (ref >60.00)
Glucose, Bld: 100 mg/dL — ABNORMAL HIGH (ref 70–99)
Potassium: 4.2 mEq/L (ref 3.5–5.1)
Sodium: 141 mEq/L (ref 135–145)
Total Bilirubin: 0.6 mg/dL (ref 0.2–1.2)
Total Protein: 6.5 g/dL (ref 6.0–8.3)

## 2020-02-11 LAB — TSH: TSH: 1.21 u[IU]/mL (ref 0.35–4.50)

## 2020-02-11 NOTE — Progress Notes (Signed)
Chief Complaint  Patient presents with  . Blood Pressure Check    History of Present Illness: HPI    74 year old female patient of Sandra [name]'s presents for blood pressure check.  Hypertension:   On lisinopril and amlodipine Home meter consistent with cuff in office.   BP Readings from Last 3 Encounters:  02/11/20 (!) 146/78  09/13/19 (!) 143/89  07/28/19 (!) 151/79   no headache. Using medication without problems or lightheadedness:  Occ. Chest pain with exertion: none Edema: none Short of breath: none Average home BPs:   Last night 148-150s?/87... arm  Had not been checking until recently. Home health nurse few weeks ago noted 128/78 Other issues:   No new meds, no missed doses. No change in salt. No ETOH.   This visit occurred during the SARS-CoV-2 public health emergency.  Safety protocols were in place, including screening questions prior to the visit, additional usage of staff PPE, and extensive cleaning of exam room while observing appropriate contact time as indicated for disinfecting solutions.   COVID 19 screen:  No recent travel or known exposure to COVID19 The patient denies respiratory symptoms of COVID 19 at this time. The importance of social distancing was discussed today.     Review of Systems  Constitutional: Negative for chills and fever.  HENT: Negative for congestion and ear pain.   Eyes: Negative for pain and redness.  Respiratory: Negative for cough and shortness of breath.   Cardiovascular: Negative for chest pain, palpitations and leg swelling.  Gastrointestinal: Negative for abdominal pain, blood in stool, constipation, diarrhea, nausea and vomiting.  Genitourinary: Negative for dysuria.  Musculoskeletal: Negative for falls and myalgias.  Skin: Negative for rash.  Neurological: Negative for dizziness.  Psychiatric/Behavioral: Negative for depression. The patient is not nervous/anxious.       Past Medical History:  Diagnosis Date  .  Breast cancer ([place]) 07/13/2015   RIGHT lumpectomy 07/13/15, pT1c, pN0,M0 Stage 1 Er/PR pos, her 2 neg. MammaPrint High  . Hyperlipidemia   . Hypertension   . Neuropathy   . 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 Personal history of chemotherapy 2016  . Personal history of radiation therapy 2016  . Visual blurriness    SOMETHING NEW THAT HAS DEVELOPED SINCE 07-13-15 SURGERY- SEEN AT [facility] AND EXAM WAS NORMAL PER PT (08-09-15)    reports that she has never smoked. She has never used smokeless tobacco. She reports that she does not drink alcohol and does not use drugs.   Current Outpatient Medications:  .  amLODipine (NORVASC) 5 MG tablet, Take 1 tablet by mouth once daily, Disp: 90 tablet, Rfl: 2 .  anastrozole (ARIMIDEX) 1 MG tablet, Take 1 tablet by mouth once daily, Disp: 90 tablet, Rfl: 3 .  b complex vitamins tablet, Take 1 tablet by mouth daily., Disp: , Rfl:  .  BIOTIN PO, Take 5,000 mcg by mouth daily., Disp: , Rfl:  .  Calcium Carbonate-Vitamin D (CALCIUM 500 + D PO), Take 2 tablets by mouth daily., Disp: , Rfl:  .  COD LIVER OIL/VITAMINS A & D CAPS, Take by mouth., Disp: , Rfl:  .  Flaxseed, Linseed, (FLAX SEEDS PO), Take by mouth daily., Disp: , Rfl:  .  gabapentin (NEURONTIN) 300 MG capsule, TAKE 1 CAPSULE BY MOUTH THREE TIMES DAILY, Disp: 270 capsule, Rfl: 0 .  Garlic 3329 MG CAPS, Take by mouth., Disp: , Rfl:  .  lisinopril (ZESTRIL) 10 MG tablet,  Take 1 tablet by mouth once daily, Disp: 90 tablet, Rfl: 0 .  PAPAYA ENZYMES PO, Take by mouth daily., Disp: , Rfl:  .  Potassium 99 MG TABS, Take 99 mg by mouth daily. , Disp: , Rfl:  .  Turmeric 500 MG CAPS, Take 1 capsule by mouth daily., Disp: , Rfl:    Observations/Objective: Blood pressure (!) 146/78, pulse 69, temperature (!) 97.5 F (36.4 C), temperature source Temporal, weight 125 lb 4 oz (56.8 kg), SpO2 98 %.  Physical Exam Constitutional:      General: She is not in acute distress.     Appearance: Normal appearance. She is well-developed. She is not ill-appearing or toxic-appearing.  HENT:     Head: Normocephalic.     Right Ear: Hearing, tympanic membrane, ear canal and external ear normal. Tympanic membrane is not erythematous, retracted or bulging.     Left Ear: Hearing, tympanic membrane, ear canal and external ear normal. Tympanic membrane is not erythematous, retracted or bulging.     Nose: No mucosal edema or rhinorrhea.     Right Sinus: No maxillary sinus tenderness or frontal sinus tenderness.     Left Sinus: No maxillary sinus tenderness or frontal sinus tenderness.     Mouth/Throat:     Pharynx: Uvula midline.  Eyes:     General: Lids are normal. Lids are everted, no foreign bodies appreciated.     Conjunctiva/sclera: Conjunctivae normal.     Pupils: Pupils are equal, round, and reactive to light.  Neck:     Thyroid: No thyroid mass or thyromegaly.     Vascular: No carotid bruit.     Trachea: Trachea normal.  Cardiovascular:     Rate and Rhythm: Normal rate and regular rhythm.     Pulses: Normal pulses.     Heart sounds: Normal heart sounds, S1 normal and S2 normal. No murmur heard.  No friction rub. No gallop.   Pulmonary:     Effort: Pulmonary effort is normal. No tachypnea or respiratory distress.     Breath sounds: Normal breath sounds. No decreased breath sounds, wheezing, rhonchi or rales.  Abdominal:     General: Bowel sounds are normal.     Palpations: Abdomen is soft.     Tenderness: There is no abdominal tenderness.  Musculoskeletal:     Cervical back: Normal range of motion and neck supple.  Skin:    General: Skin is warm and dry.     Findings: No rash.  Neurological:     Mental Status: She is alert.  Psychiatric:        Mood and Affect: Mood is not anxious or depressed.        Speech: Speech normal.        Behavior: Behavior normal. Behavior is cooperative.        Thought Content: Thought content normal.        Judgment: Judgment  normal.      Assessment and Plan Hypertension Borderline elevation. Goal after age 41 in nondiabetic 150/90. Eval with labs for new change. Follow BP at home over next 2 weeks bring in measurements. Follow up with PCP in 2 weeks for re-eval   DASH and low salt  Diet.  If elevated may need to increase lisinopril.       Eliezer Lofts, MD

## 2020-02-11 NOTE — Patient Instructions (Signed)
Please stop at the lab to have labs drawn.  Follow blood pressure every other day for 2 weeks.  Goal < 150/90.  Work on lower salt diet and DASH diet.

## 2020-02-11 NOTE — Assessment & Plan Note (Signed)
Borderline elevation. Goal after age 74 in nondiabetic 150/90. Eval with labs for new change. Follow BP at home over next 2 weeks bring in measurements. Follow up with PCP in 2 weeks for re-eval   DASH and low salt  Diet.  If elevated may need to increase lisinopril.

## 2020-02-25 ENCOUNTER — Ambulatory Visit: Payer: Medicare PPO | Admitting: Family Medicine

## 2020-02-28 ENCOUNTER — Ambulatory Visit: Payer: Medicare PPO | Admitting: Internal Medicine

## 2020-02-28 ENCOUNTER — Encounter: Payer: Self-pay | Admitting: Internal Medicine

## 2020-02-28 ENCOUNTER — Other Ambulatory Visit: Payer: Self-pay

## 2020-02-28 DIAGNOSIS — I10 Essential (primary) hypertension: Secondary | ICD-10-CM | POA: Diagnosis not present

## 2020-02-28 NOTE — Assessment & Plan Note (Signed)
Controlled on Losartan and Amlodipine Reinforced DASH diet  Advised her BP varies from time, as long as she is consistently below 150/90

## 2020-02-28 NOTE — Progress Notes (Signed)
Subjective:    Patient ID: Boneta Lucks, female    DOB: 01-04-1946, 74 y.o.   MRN: [id number]  HPI  Patient presents the clinic today for 2-week follow-up of hypertension.  She had noticed at her last visit that BP has been elevated.  She saw Dr. Diona Browner for the same on 02/11/2020.  Labs were not concerning.  She was continued on her Lisinopril 10 mg and Amlodipine 5 mg daily, advised to keep a blood pressure log and bring it to her visit today. Her BP today is 122/78. ECG from 09/2018 reviewed.  Review of Systems      Past Medical History:  Diagnosis Date  . Breast cancer ([place]) 07/13/2015   RIGHT lumpectomy 07/13/15, pT1c, pN0,M0 Stage 1 Er/PR pos, her 2 neg. MammaPrint High  . Hyperlipidemia   . Hypertension   . Neuropathy   . 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 Personal history of chemotherapy 2016  . Personal history of radiation therapy 2016  . Visual blurriness    SOMETHING NEW THAT HAS DEVELOPED SINCE 07-13-15 SURGERY- SEEN AT [facility] ENT AND EXAM WAS NORMAL PER PT (08-09-15)    Current Outpatient Medications  Medication Sig Dispense Refill  . amLODipine (NORVASC) 5 MG tablet Take 1 tablet by mouth once daily 90 tablet 2  . anastrozole (ARIMIDEX) 1 MG tablet Take 1 tablet by mouth once daily 90 tablet 3  . b complex vitamins tablet Take 1 tablet by mouth daily.    Marland Kitchen BIOTIN PO Take 5,000 mcg by mouth daily.    . Calcium Carbonate-Vitamin D (CALCIUM 500 + D PO) Take 2 tablets by mouth daily.    . COD LIVER OIL/VITAMINS A & D CAPS Take by mouth.    . Flaxseed, Linseed, (FLAX SEEDS PO) Take by mouth daily.    Marland Kitchen gabapentin (NEURONTIN) 300 MG capsule TAKE 1 CAPSULE BY MOUTH THREE TIMES DAILY 270 capsule 0  . Garlic 5009 MG CAPS Take by mouth.    Marland Kitchen lisinopril (ZESTRIL) 10 MG tablet Take 1 tablet by mouth once daily 90 tablet 0  . PAPAYA ENZYMES PO Take by mouth daily.    . Potassium 99 MG TABS Take 99 mg by mouth daily.     . Turmeric 500 MG  CAPS Take 1 capsule by mouth daily.     No current facility-administered medications for this visit.    Allergies  Allergen Reactions  . Cymbalta [Duloxetine Hcl] Other (See Comments)    Glaucoma.  Unable to take  . Lovastatin Anaphylaxis    Tongue swelling    Family History  Problem Relation Age of Onset  . Cancer Sister 38       breast cancer  . Breast cancer Sister 99  . Colon cancer Neg Hx     Social History   Socioeconomic History  . Marital status: Married    Spouse name: Not on file  . Number of children: Not on file  . Years of education: Not on file  . Highest education level: Not on file  Occupational History  . Not on file  Tobacco Use  . Smoking status: Never Smoker  . Smokeless tobacco: Never Used  Vaping Use  . Vaping Use: Never used  Substance and Sexual Activity  . Alcohol use: No    Alcohol/week: 0.0 standard drinks  . Drug use: No  . Sexual activity: Yes  Other Topics Concern  . Not on file  Social History  Narrative   End of life wishes (updated 01/2013)-   Daughter of wishes (Cherlyl Duwayne Heck)      Desires CPR.   Desires life support if reasonable.         Social Determinants of Health   Financial Resource Strain:   . Difficulty of Paying Living Expenses:   Food Insecurity:   . Worried About Charity fundraiser in the Last Year:   . Arboriculturist in the Last Year:   Transportation Needs:   . Film/video editor (Medical):   Marland Kitchen Lack of Transportation (Non-Medical):   Physical Activity:   . Days of Exercise per Week:   . Minutes of Exercise per Session:   Stress:   . Feeling of Stress :   Social Connections:   . Frequency of Communication with Friends and Family:   . Frequency of Social Gatherings with Friends and Family:   . Attends Religious Services:   . Active Member of Clubs or Organizations:   . Attends Archivist Meetings:   Marland Kitchen Marital Status:   Intimate Partner Violence:   . Fear of Current or Ex-Partner:    . Emotionally Abused:   Marland Kitchen Physically Abused:   . Sexually Abused:      Constitutional: Denies fever, malaise, fatigue, headache or abrupt weight changes.  Respiratory: Denies difficulty breathing, shortness of breath, cough or sputum production.   Cardiovascular: Denies chest pain, chest tightness, palpitations or swelling in the hands or feet.  Neurological: Denies dizziness, difficulty with memory, difficulty with speech or problems with balance and coordination.    No other specific complaints in a complete review of systems (except as listed in HPI above).  Objective:   Physical Exam  BP 122/78   Pulse 70   Temp 97.8 F (36.6 C) (Temporal)   Wt 123 lb (55.8 kg)   LMP  (LMP Unknown)   SpO2 98%   BMI 22.86 kg/m   Wt Readings from Last 3 Encounters:  02/11/20 125 lb 4 oz (56.8 kg)  07/28/19 125 lb (56.7 kg)  05/03/19 123 lb (55.8 kg)    General: Appears her stated age, well developed, well nourished in NAD. Cardiovascular: Normal rate and rhythm. S1,S2 noted.  No murmur, rubs or gallops noted. No JVD or BLE edema.  Pulmonary/Chest: Normal effort and positive vesicular breath sounds. No respiratory distress. No wheezes, rales or ronchi noted.  Neurological: Alert and oriented.    BMET    Component Value Date/Time   NA 141 02/11/2020 1026   K 4.2 02/11/2020 1026   K 4.0 02/24/2012 0743   CL 105 02/11/2020 1026   CO2 30 02/11/2020 1026   GLUCOSE 100 (H) 02/11/2020 1026   BUN 13 02/11/2020 1026   CREATININE 0.83 02/11/2020 1026   CREATININE 0.83 02/19/2019 1452   CALCIUM 9.4 02/11/2020 1026   GFRNONAA >60 09/13/2019 0912   GFRAA >60 09/13/2019 0912    Lipid Panel     Component Value Date/Time   CHOL 169 05/03/2019 1453   TRIG 37.0 05/03/2019 1453   HDL 80.20 05/03/2019 1453   CHOLHDL 2 05/03/2019 1453   VLDL 7.4 05/03/2019 1453   LDLCALC 82 05/03/2019 1453    CBC    Component Value Date/Time   WBC 2.6 (L) 02/11/2020 1026   RBC 4.20 02/11/2020 1026    HGB 12.6 02/11/2020 1026   HGB 13.4 11/08/2014 1059   HCT 37.8 02/11/2020 1026   HCT 39.8 11/08/2014 1059   PLT  280.0 02/11/2020 1026   PLT 318 11/08/2014 1059   MCV 90.1 02/11/2020 1026   MCV 88 11/08/2014 1059   MCH 29.4 09/13/2019 0912   MCHC 33.4 02/11/2020 1026   RDW 13.1 02/11/2020 1026   RDW 12.7 11/08/2014 1059   LYMPHSABS 1.2 02/11/2020 1026   LYMPHSABS 1.3 11/08/2014 1059   MONOABS 0.2 02/11/2020 1026   MONOABS 0.3 11/08/2014 1059   EOSABS 0.1 02/11/2020 1026   EOSABS 0.2 11/08/2014 1059   BASOSABS 0.0 02/11/2020 1026   BASOSABS 0.0 11/08/2014 1059    Hgb A1C Lab Results  Component Value Date   HGBA1C 5.1 07/26/2015            Assessment & Plan:    Webb Silversmith, NP This visit occurred during the SARS-CoV-2 public health emergency.  Safety protocols were in place, including screening questions prior to the visit, additional usage of staff PPE, and extensive cleaning of exam room while observing appropriate contact time as indicated for disinfecting solutions.

## 2020-02-28 NOTE — Patient Instructions (Signed)

## 2020-03-13 ENCOUNTER — Other Ambulatory Visit: Payer: Self-pay

## 2020-03-13 ENCOUNTER — Inpatient Hospital Stay: Payer: Medicare PPO | Admitting: Internal Medicine

## 2020-03-13 ENCOUNTER — Inpatient Hospital Stay: Payer: Medicare PPO | Attending: Internal Medicine

## 2020-03-13 DIAGNOSIS — C50211 Malignant neoplasm of upper-inner quadrant of right female breast: Secondary | ICD-10-CM | POA: Insufficient documentation

## 2020-03-13 DIAGNOSIS — Z170 Estrogen receptor positive status [ER+]: Secondary | ICD-10-CM | POA: Diagnosis not present

## 2020-03-13 DIAGNOSIS — G629 Polyneuropathy, unspecified: Secondary | ICD-10-CM | POA: Insufficient documentation

## 2020-03-13 DIAGNOSIS — Z79811 Long term (current) use of aromatase inhibitors: Secondary | ICD-10-CM | POA: Diagnosis not present

## 2020-03-13 DIAGNOSIS — Z9221 Personal history of antineoplastic chemotherapy: Secondary | ICD-10-CM | POA: Diagnosis not present

## 2020-03-13 DIAGNOSIS — Z90710 Acquired absence of both cervix and uterus: Secondary | ICD-10-CM | POA: Insufficient documentation

## 2020-03-13 DIAGNOSIS — Z923 Personal history of irradiation: Secondary | ICD-10-CM | POA: Diagnosis not present

## 2020-03-13 DIAGNOSIS — D708 Other neutropenia: Secondary | ICD-10-CM | POA: Diagnosis not present

## 2020-03-13 DIAGNOSIS — M8580 Other specified disorders of bone density and structure, unspecified site: Secondary | ICD-10-CM | POA: Diagnosis not present

## 2020-03-13 DIAGNOSIS — Z803 Family history of malignant neoplasm of breast: Secondary | ICD-10-CM | POA: Insufficient documentation

## 2020-03-13 LAB — COMPREHENSIVE METABOLIC PANEL
ALT: 19 U/L (ref 0–44)
AST: 24 U/L (ref 15–41)
Albumin: 3.9 g/dL (ref 3.5–5.0)
Alkaline Phosphatase: 53 U/L (ref 38–126)
Anion gap: 10 (ref 5–15)
BUN: 13 mg/dL (ref 8–23)
CO2: 26 mmol/L (ref 22–32)
Calcium: 9 mg/dL (ref 8.9–10.3)
Chloride: 104 mmol/L (ref 98–111)
Creatinine, Ser: 0.83 mg/dL (ref 0.44–1.00)
GFR calc Af Amer: >60 mL/min (ref >60)
GFR calc non Af Amer: >60 mL/min (ref >60)
Glucose, Bld: 124 mg/dL — ABNORMAL HIGH (ref 70–99)
Potassium: 4.1 mmol/L (ref 3.5–5.1)
Sodium: 140 mmol/L (ref 135–145)
Total Bilirubin: 0.8 mg/dL (ref 0.3–1.2)
Total Protein: 6.6 g/dL (ref 6.5–8.1)

## 2020-03-13 LAB — CBC WITH DIFFERENTIAL/PLATELET
Abs Immature Granulocytes: 0 10*3/uL (ref 0.00–0.07)
Basophils Absolute: 0 10*3/uL (ref 0.0–0.1)
Basophils Relative: 1 %
Eosinophils Absolute: 0.1 10*3/uL (ref 0.0–0.5)
Eosinophils Relative: 4 %
HCT: 36.8 % (ref 36.0–46.0)
Hemoglobin: 12.4 g/dL (ref 12.0–15.0)
Immature Granulocytes: 0 %
Lymphocytes Relative: 38 %
Lymphs Abs: 0.9 10*3/uL (ref 0.7–4.0)
MCH: 30 pg (ref 26.0–34.0)
MCHC: 33.7 g/dL (ref 30.0–36.0)
MCV: 88.9 fL (ref 80.0–100.0)
Monocytes Absolute: 0.2 10*3/uL (ref 0.1–1.0)
Monocytes Relative: 8 %
Neutro Abs: 1.1 10*3/uL — ABNORMAL LOW (ref 1.7–7.7)
Neutrophils Relative %: 49 %
Platelets: 275 10*3/uL (ref 150–400)
RBC: 4.14 MIL/uL (ref 3.87–5.11)
RDW: 12.2 % (ref 11.5–15.5)
WBC: 2.3 10*3/uL — ABNORMAL LOW (ref 4.0–10.5)
nRBC: 0 % (ref 0.0–0.2)

## 2020-03-13 NOTE — Assessment & Plan Note (Addendum)
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 STABLE.   # No clinical evidence of recurrence.  Continue anastrozole at this time.  Tolerating without any major side effects.   # Osteopenia- BMD Nov 2020; T score= -.1.9; STABLE. Continue calcium and vitamin D; reviewed the BMD.  Repeat bone density test in the next 2 to 3 years.  # Chronic benign ethnic neutropenia /mild leucopenia- 2.3/ ANC-1.1- overall stable  # Grade 2 neuropathy-STABLE;  Neurontin 300 mg TID.   # DISPOSITION:  Mammogram- dec 2021- # follow up in 6 months-MD/labs- cbc/cmp-Dr.B.

## 2020-03-13 NOTE — Progress Notes (Signed)
[place] @ [facility] [phone]  [phone]   INITIAL CONSULT  Sandra [name] OB: [birth year]  MR#: [id number]  WVP#:[id number]  Patient Care Team: Jearld Fenton, NP as PCP - General (Internal Medicine) Glennie Isle, PA-C (Physician Assistant) Pyrtle, Lajuan Lines, MD as Consulting Physician (Gastroenterology) Lucille Passy, MD (Inactive) as Consulting Physician (Family Medicine) Christene Lye, MD (General Surgery) Cammie Sickle, MD as Consulting Physician (Internal Medicine) Leona Singleton, RN as Oncology Nurse Navigator Noreene Filbert, MD as Referring Physician (Radiation Oncology)  CHIEF COMPLAINT:  Chief Complaint  Patient presents with  . Breast Cancer   VISIT DIAGNOSIS:     ICD-10-CM   1. Carcinoma of upper-inner quadrant of right breast in female, estrogen receptor positive ([place])  C50.211 MM DIAG BREAST TOMO BILATERAL   Z17.0 US Breast Limited Uni Right Inc Axilla    US Breast Limited Uni Left Inc Axilla     Oncology History Overview Note   carcinoma breast (right) inner and upper quadrant T1 cN0 M0 tumor Estrogen receptor positive progesterone receptor positive HER-2 receptor negative by fish Mammo print shows high risk (diagnosis in January of 2016) patient has 22% average risk in 5 years and 29% average risk in 10 years for distant metastectomy disease without chemotherapy on anti-hormonal treatment 2.  Patient started Cytoxan and Adriamycin on now August 30, 2015 MUGA scan shows ejection fraction of baseline 55%.  3.Patient has finished total of 4 cycles of chemotherapy on November 01, 2015 4.  Started on the Taxol from November 22, 2015; FINISHED RT [sep 2017] # OCT 2017- START ARIMIDEX   # NOV 2021 BMD- T score= - 1.9 -OSTEOPENIA- ca+vit D  # SURVIVORSHIP: O  --------------------------------------------------------   DIAGNOSIS: RIGHT BREAST CA  STAGE: I        ;GOALS: cure  CURRENT/MOST RECENT THERAPY: on  anastrazole      Carcinoma of upper-inner quadrant of right breast in female, estrogen receptor positive ([place])    INTERVAL HISTORY: 74 year old lady History of stage I right-sided breast cancer ER/PR positive currently on Arimidex is here for follow-up.  Patient appetite is good.  No weight loss.  No headaches.  No worsening bone pain or joint pains.  She continues to exercise/walk 3 miles a day.  Tingling and numbness of extremities.  .  Review of Systems  Constitutional: Negative for chills, diaphoresis, fever, malaise/fatigue and weight loss.  HENT: Negative for nosebleeds and sore throat.   Eyes: Negative for double vision.  Respiratory: Negative for cough, hemoptysis, sputum production, shortness of breath and wheezing.   Cardiovascular: Negative for chest pain, palpitations, orthopnea and leg swelling.  Gastrointestinal: Negative for abdominal pain, blood in stool, constipation, diarrhea, heartburn, melena, nausea and vomiting.  Genitourinary: Negative for dysuria, frequency and urgency.  Musculoskeletal: Negative for back pain and joint pain.  Skin: Negative.  Negative for itching and rash.  Neurological: Positive for tingling. Negative for dizziness, focal weakness, weakness and headaches.  Endo/Heme/Allergies: Does not bruise/bleed easily.  Psychiatric/Behavioral: Negative for depression. The patient is not nervous/anxious and does not have insomnia.      PAST MEDICAL HISTORY: Past Medical History:  Diagnosis Date  . Breast cancer ([place]) 07/13/2015   RIGHT lumpectomy 07/13/15, pT1c, pN0,M0 Stage 1 Er/PR pos, her 2 neg. MammaPrint High  . Hyperlipidemia   . Hypertension   . Neuropathy   . 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 Personal history of chemotherapy 2016  .  Personal history of radiation therapy 2016  . Visual blurriness    SOMETHING NEW THAT HAS DEVELOPED SINCE 07-13-15 SURGERY- SEEN AT [facility] ENT AND EXAM WAS NORMAL PER PT (08-09-15)     PAST SURGICAL HISTORY: Past Surgical History:  Procedure Laterality Date  . ABDOMINAL HYSTERECTOMY  1987  . BREAST BIOPSY Right    neg  . BREAST BIOPSY Left    4 oclock, FIBROEPITHELIAL LESION FAVOR CELLULAR FIBROADENOMA  . BREAST BIOPSY Left    2 oclock, CYSTIC APOCRINE METAPLASIA  . BREAST BIOPSY Right 07/13/15   130 INVASIVE MAMMARY CARCINOMA, NO SPECIAL TYPE  . BREAST EXCISIONAL BIOPSY Left 2016   fibroadenoma  . BREAST LUMPECTOMY Left 07/13/2015  . BREAST LUMPECTOMY WITH SENTINEL LYMPH NODE BIOPSY Right 07/13/2015   Procedure: BREAST LUMPECTOMY WITH SENTINEL LYMPH NODE BX;  Surgeon: Christene Lye, MD;  Location: [facility] ORS;  Service: General;  Laterality: Right;  . BUNIONECTOMY Right 1993  . CATARACT EXTRACTION W/PHACO Left 09/17/2016   Procedure: CATARACT EXTRACTION PHACO AND INTRAOCULAR LENS PLACEMENT (IOC);  Surgeon: Birder Robson, MD;  Location: [facility] ORS;  Service: Ophthalmology;  Laterality: Left;  [place] 00:47AP% 17.6CDE 8.39Fluid pack lot # [id number]  . CATARACT EXTRACTION W/PHACO Right 01/21/2017   Procedure: CATARACT EXTRACTION PHACO AND INTRAOCULAR LENS PLACEMENT (IOC);  Surgeon: Birder Robson, MD;  Location: [facility] ORS;  Service: Ophthalmology;  Laterality: Right;  [place] 00:39 AP% 13.0 CDE 5.16 fluid pack lot # [id number]  . COLONOSCOPY  2015  . fatty tumor Left 2013   side  . PORT-A-CATH REMOVAL  2017  . PORTACATH PLACEMENT Left 08/16/2015   Procedure: INSERTION PORT-A-CATH;  Surgeon: Christene Lye, MD;  Location: [facility] ORS;  Service: General;  Laterality: Left;    FAMILY HISTORY Family History  Problem Relation Age of Onset  . Cancer Sister 6       breast cancer  . Breast cancer Sister 73  . Colon cancer Neg Hx         ADVANCED DIRECTIVES:   Patient does not have any living will or healthcare power of attorney.  Information was given .  Available resources had been discussed.  We will follow-up on subsequent appointments regarding this  issue HEALTH MAINTENANCE: Social History   Tobacco Use  . Smoking status: Never Smoker  . Smokeless tobacco: Never Used  Vaping Use  . Vaping Use: Never used  Substance Use Topics  . Alcohol use: No    Alcohol/week: 0.0 standard drinks  . Drug use: No      :  Allergies  Allergen Reactions  . Cymbalta [Duloxetine Hcl] Other (See Comments)    Glaucoma.  Unable to take  . Lovastatin Anaphylaxis    Tongue swelling    Current Outpatient Medications  Medication Sig Dispense Refill  . amLODipine (NORVASC) 5 MG tablet Take 1 tablet by mouth once daily 90 tablet 2  . anastrozole (ARIMIDEX) 1 MG tablet Take 1 tablet by mouth once daily 90 tablet 3  . b complex vitamins tablet Take 1 tablet by mouth daily.    Marland Kitchen BIOTIN PO Take 5,000 mcg by mouth daily.    . Calcium Carbonate-Vitamin D (CALCIUM 500 + D PO) Take 2 tablets by mouth daily.    . COD LIVER OIL/VITAMINS A & D CAPS Take by mouth.    . Flaxseed, Linseed, (FLAX SEEDS PO) Take by mouth daily.    Marland Kitchen gabapentin (NEURONTIN) 300 MG capsule TAKE 1 CAPSULE BY MOUTH THREE TIMES DAILY 270 capsule 0  .  Garlic 5643 MG CAPS Take by mouth.    Marland Kitchen lisinopril (ZESTRIL) 10 MG tablet Take 1 tablet by mouth once daily 90 tablet 0  . PAPAYA ENZYMES PO Take by mouth daily.    . Potassium 99 MG TABS Take 99 mg by mouth daily.     . Turmeric 500 MG CAPS Take 1 capsule by mouth daily.     No current facility-administered medications for this visit.    OBJECTIVE: Physical Exam HENT:     Head: Normocephalic and atraumatic.     Mouth/Throat:     Pharynx: No oropharyngeal exudate.  Eyes:     Pupils: Pupils are equal, round, and reactive to light.  Cardiovascular:     Rate and Rhythm: Normal rate and regular rhythm.  Pulmonary:     Effort: No respiratory distress.     Breath sounds: No wheezing.  Abdominal:     General: Bowel sounds are normal. There is no distension.     Palpations: Abdomen is soft. There is no mass.     Tenderness: There  is no abdominal tenderness. There is no guarding or rebound.  Musculoskeletal:        General: No tenderness. Normal range of motion.     Cervical back: Normal range of motion and neck supple.  Skin:    General: Skin is warm.     Comments: Right and left BREAST exam (in the presence of nurse)- no unusual skin changes or dominant masses felt. Surgical scars noted.    Neurological:     Mental Status: She is alert and oriented to person, place, and time.  Psychiatric:        Mood and Affect: Affect normal.       Vitals:   03/13/20 1030  BP: (!) 141/84  Pulse: 69  Resp: 18  Temp: (!) 97 F (36.1 C)     There is no height or weight on file to calculate BMI.    ECOG FS:0 - Asymptomatic  LAB RESULTS:  Appointment on 03/13/2020  Component Date Value Ref Range Status  . Sodium 03/13/2020 140  135 - 145 mmol/L Final  . Potassium 03/13/2020 4.1  3.5 - 5.1 mmol/L Final  . Chloride 03/13/2020 104  98 - 111 mmol/L Final  . CO2 03/13/2020 26  22 - 32 mmol/L Final  . Glucose, Bld 03/13/2020 124* 70 - 99 mg/dL Final   Glucose reference range applies only to samples taken after fasting for at least 8 hours.  . BUN 03/13/2020 13  8 - 23 mg/dL Final  . Creatinine, Ser 03/13/2020 0.83  0.44 - 1.00 mg/dL Final  . Calcium 03/13/2020 9.0  8.9 - 10.3 mg/dL Final  . Total Protein 03/13/2020 6.6  6.5 - 8.1 g/dL Final  . Albumin 03/13/2020 3.9  3.5 - 5.0 g/dL Final  . AST 03/13/2020 24  15 - 41 U/L Final  . ALT 03/13/2020 19  0 - 44 U/L Final  . Alkaline Phosphatase 03/13/2020 53  38 - 126 U/L Final  . Total Bilirubin 03/13/2020 0.8  0.3 - 1.2 mg/dL Final  . GFR calc non Af Amer 03/13/2020 >60  >60 mL/min Final  . GFR calc Af Amer 03/13/2020 >60  >60 mL/min Final  . Anion gap 03/13/2020 10  5 - 15 Final   Performed at [facility], [address] [place], [place] [address]  . WBC 03/13/2020 2.3* 4.0 - 10.5 K/uL Final  . RBC 03/13/2020 4.14  3.87 - 5.11  MIL/uL Final  . Hemoglobin  03/13/2020 12.4  12.0 - 15.0 g/dL Final  . HCT 03/13/2020 36.8  36 - 46 % Final  . MCV 03/13/2020 88.9  80.0 - 100.0 fL Final  . MCH 03/13/2020 30.0  26.0 - 34.0 pg Final  . MCHC 03/13/2020 33.7  30.0 - 36.0 g/dL Final  . RDW 03/13/2020 12.2  11.5 - 15.5 % Final  . Platelets 03/13/2020 275  150 - 400 K/uL Final  . nRBC 03/13/2020 0.0  0.0 - 0.2 % Final  . Neutrophils Relative % 03/13/2020 49  % Final  . Neutro Abs 03/13/2020 1.1* 1.7 - 7.7 K/uL Final  . Lymphocytes Relative 03/13/2020 38  % Final  . Lymphs Abs 03/13/2020 0.9  0.7 - 4.0 K/uL Final  . Monocytes Relative 03/13/2020 8  % Final  . Monocytes Absolute 03/13/2020 0.2  0 - 1 K/uL Final  . Eosinophils Relative 03/13/2020 4  % Final  . Eosinophils Absolute 03/13/2020 0.1  0 - 0 K/uL Final  . Basophils Relative 03/13/2020 1  % Final  . Basophils Absolute 03/13/2020 0.0  0 - 0 K/uL Final  . Immature Granulocytes 03/13/2020 0  % Final  . Abs Immature Granulocytes 03/13/2020 0.00  0.00 - 0.07 K/uL Final   Performed at [facility], [address] [place], [place] [address]     STUDIES: No results found.  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 STABLE.   # No clinical evidence of recurrence.  Continue anastrozole at this time.  Tolerating without any major side effects.   # Osteopenia- BMD Nov 2020; T score= -.1.9; STABLE. Continue calcium and vitamin D; reviewed the BMD.  Repeat bone density test in the next 2 to 3 years.  # Chronic benign ethnic neutropenia /mild leucopenia- 2.3/ ANC-1.1- overall stable  # Grade 2 neuropathy-STABLE;  Neurontin 300 mg TID.   # DISPOSITION:  Mammogram- dec 2021- # follow up in 6 months-MD/labs- cbc/cmp-Dr.B.   No matching staging information was found for the patient.  Cammie Sickle, MD   03/13/2020 11:07 AM

## 2020-03-16 ENCOUNTER — Other Ambulatory Visit: Payer: Self-pay | Admitting: Internal Medicine

## 2020-05-03 ENCOUNTER — Other Ambulatory Visit: Payer: Self-pay | Admitting: Internal Medicine

## 2020-05-03 DIAGNOSIS — T451X5A Adverse effect of antineoplastic and immunosuppressive drugs, initial encounter: Secondary | ICD-10-CM

## 2020-05-08 ENCOUNTER — Encounter: Payer: Self-pay | Admitting: Internal Medicine

## 2020-05-08 ENCOUNTER — Ambulatory Visit (INDEPENDENT_AMBULATORY_CARE_PROVIDER_SITE_OTHER): Payer: Medicare PPO | Admitting: Internal Medicine

## 2020-05-08 ENCOUNTER — Other Ambulatory Visit: Payer: Self-pay

## 2020-05-08 VITALS — BP 122/76 | HR 73 | Temp 97.9°F | Ht 62.0 in | Wt 121.0 lb

## 2020-05-08 DIAGNOSIS — Z0000 Encounter for general adult medical examination without abnormal findings: Secondary | ICD-10-CM | POA: Diagnosis not present

## 2020-05-08 DIAGNOSIS — E7800 Pure hypercholesterolemia, unspecified: Secondary | ICD-10-CM

## 2020-05-08 DIAGNOSIS — G620 Drug-induced polyneuropathy: Secondary | ICD-10-CM | POA: Diagnosis not present

## 2020-05-08 DIAGNOSIS — C50211 Malignant neoplasm of upper-inner quadrant of right female breast: Secondary | ICD-10-CM

## 2020-05-08 DIAGNOSIS — I10 Essential (primary) hypertension: Secondary | ICD-10-CM | POA: Diagnosis not present

## 2020-05-08 DIAGNOSIS — Z170 Estrogen receptor positive status [ER+]: Secondary | ICD-10-CM

## 2020-05-08 DIAGNOSIS — T451X5A Adverse effect of antineoplastic and immunosuppressive drugs, initial encounter: Secondary | ICD-10-CM

## 2020-05-08 LAB — CBC
HCT: 36.4 % (ref 36.0–46.0)
Hemoglobin: 12.1 g/dL (ref 12.0–15.0)
MCHC: 33.3 g/dL (ref 30.0–36.0)
MCV: 89.8 fl (ref 78.0–100.0)
Platelets: 281 10*3/uL (ref 150.0–400.0)
RBC: 4.06 Mil/uL (ref 3.87–5.11)
RDW: 12.9 % (ref 11.5–15.5)
WBC: 2.8 10*3/uL — ABNORMAL LOW (ref 4.0–10.5)

## 2020-05-08 LAB — COMPREHENSIVE METABOLIC PANEL WITH GFR
ALT: 19 U/L (ref 0–35)
AST: 25 U/L (ref 0–37)
Albumin: 3.9 g/dL (ref 3.5–5.2)
Alkaline Phosphatase: 46 U/L (ref 39–117)
BUN: 12 mg/dL (ref 6–23)
CO2: 29 meq/L (ref 19–32)
Calcium: 9.2 mg/dL (ref 8.4–10.5)
Chloride: 104 meq/L (ref 96–112)
Creatinine, Ser: 0.86 mg/dL (ref 0.40–1.20)
GFR: 77.78 mL/min
Glucose, Bld: 95 mg/dL (ref 70–99)
Potassium: 4 meq/L (ref 3.5–5.1)
Sodium: 140 meq/L (ref 135–145)
Total Bilirubin: 0.6 mg/dL (ref 0.2–1.2)
Total Protein: 6.5 g/dL (ref 6.0–8.3)

## 2020-05-08 LAB — LIPID PANEL
Cholesterol: 165 mg/dL (ref 0–200)
HDL: 64.9 mg/dL
LDL Cholesterol: 91 mg/dL (ref 0–99)
NonHDL: 100.4
Total CHOL/HDL Ratio: 3
Triglycerides: 47 mg/dL (ref 0.0–149.0)
VLDL: 9.4 mg/dL (ref 0.0–40.0)

## 2020-05-08 LAB — VITAMIN D 25 HYDROXY (VIT D DEFICIENCY, FRACTURES): VITD: 78.95 ng/mL (ref 30.00–100.00)

## 2020-05-08 NOTE — Assessment & Plan Note (Signed)
Continue Gabapentin Will monitor 

## 2020-05-08 NOTE — Patient Instructions (Signed)

## 2020-05-08 NOTE — Assessment & Plan Note (Signed)
Controlled on Amlodipine and Lisinopril Reinforced DASH diet CBC and C met today

## 2020-05-08 NOTE — Assessment & Plan Note (Signed)
Continue Arimidex Mammogram scheduled She will continue to follow with oncology

## 2020-05-08 NOTE — Assessment & Plan Note (Signed)
CMET and lipid profile today Encouraged her to consume a low fat diet 

## 2020-05-08 NOTE — Progress Notes (Signed)
HPI:  Pt presents to the clinic today for her subsequent annual Medicare Wellness Exam.  HTN: Her BP today is 122/76. She is taking Amlodipine and Lisinopril as prescribed. ECG from 09/2018 reviewed.  HLD: Her last LDL was 82, 04/2019. She is not taking any cholesterol lowering medication at this time. She tries to consume a low fat diet.  History of Right Breast Cancer: s/p lumpectomy and chemo. She has chemo induced neuropathy. She is taking Gabapentin and Arimedex as prescribed. She follows with Dr. Janeth Rase.   Past Medical History:  Diagnosis Date  . Breast cancer ([place]) 07/13/2015   RIGHT lumpectomy 07/13/15, pT1c, pN0,M0 Stage 1 Er/PR pos, her 2 neg. MammaPrint High  . Hyperlipidemia   . Hypertension   . Neuropathy   . 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 Personal history of chemotherapy 2016  . Personal history of radiation therapy 2016  . Visual blurriness    SOMETHING NEW THAT HAS DEVELOPED SINCE 07-13-15 SURGERY- SEEN AT [facility] AND EXAM WAS NORMAL PER PT (08-09-15)    Current Outpatient Medications  Medication Sig Dispense Refill  . amLODipine (NORVASC) 5 MG tablet Take 1 tablet by mouth once daily 90 tablet 0  . anastrozole (ARIMIDEX) 1 MG tablet Take 1 tablet by mouth once daily 90 tablet 3  . b complex vitamins tablet Take 1 tablet by mouth daily.    Marland Kitchen BIOTIN PO Take 5,000 mcg by mouth daily.    . Calcium Carbonate-Vitamin D (CALCIUM 500 + D PO) Take 2 tablets by mouth daily.    . COD LIVER OIL/VITAMINS A & D CAPS Take by mouth.    . Flaxseed, Linseed, (FLAX SEEDS PO) Take by mouth daily.    Marland Kitchen gabapentin (NEURONTIN) 300 MG capsule TAKE 1 CAPSULE BY MOUTH THREE TIMES DAILY 270 capsule 0  . Garlic 3664 MG CAPS Take by mouth.    Marland Kitchen lisinopril (ZESTRIL) 10 MG tablet Take 1 tablet by mouth once daily 90 tablet 0  . PAPAYA ENZYMES PO Take by mouth daily.    . Potassium 99 MG TABS Take 99 mg by mouth daily.     . Turmeric 500 MG CAPS Take 1 capsule  by mouth daily.     No current facility-administered medications for this visit.    Allergies  Allergen Reactions  . Cymbalta [Duloxetine Hcl] Other (See Comments)    Glaucoma.  Unable to take  . Lovastatin Anaphylaxis    Tongue swelling    Family History  Problem Relation Age of Onset  . Cancer Sister 60       breast cancer  . Breast cancer Sister 20  . Colon cancer Neg Hx     Social History   Socioeconomic History  . Marital status: Married    Spouse name: Not on file  . Number of children: Not on file  . Years of education: Not on file  . Highest education level: Not on file  Occupational History  . Not on file  Tobacco Use  . Smoking status: Never Smoker  . Smokeless tobacco: Never Used  Vaping Use  . Vaping Use: Never used  Substance and Sexual Activity  . Alcohol use: No    Alcohol/week: 0.0 standard drinks  . Drug use: No  . Sexual activity: Yes  Other Topics Concern  . Not on file  Social History Narrative   End of life wishes (updated 01/2013)-   Daughter of wishes [facility] [place])  Desires CPR.   Desires life support if reasonable.         Social Determinants of Health   Financial Resource Strain:   . Difficulty of Paying Living Expenses: Not on file  Food Insecurity:   . Worried About Charity fundraiser in the Last Year: Not on file  . Ran Out of Food in the Last Year: Not on file  Transportation Needs:   . Lack of Transportation (Medical): Not on file  . Lack of Transportation (Non-Medical): Not on file  Physical Activity:   . Days of Exercise per Week: Not on file  . Minutes of Exercise per Session: Not on file  Stress:   . Feeling of Stress : Not on file  Social Connections:   . Frequency of Communication with Friends and Family: Not on file  . Frequency of Social Gatherings with Friends and Family: Not on file  . Attends Religious Services: Not on file  . Active Member of Clubs or Organizations: Not on file  . Attends English as a second language teacher Meetings: Not on file  . Marital Status: Not on file  Intimate Partner Violence:   . Fear of Current or Ex-Partner: Not on file  . Emotionally Abused: Not on file  . Physically Abused: Not on file  . Sexually Abused: Not on file    Hospitiliaztions: None  Health Maintenance:    Flu: 04/2019  Tetanus: 08/2018  Pneumovax: 01/2012  Prevnar: 04/2014  Zostavax: 01/2012  Shingrix: never  Covid: Pfizer  Mammogram: 07/2019  Pap Smear: 04/2015  Bone Density: 07/2019  Colon Screening: 06/2014  Eye Doctor: annually  Dental Exam: biannually   Providers:   PCP: Webb Silversmith, NP  Oncologist: Dr. Rondel Baton   I have personally reviewed and have noted:  1. The patient's medical and social history 2. Their use of alcohol, tobacco or illicit drugs 3. Their current medications and supplements 4. The patient's functional ability including ADL's, fall risks, home safety risks and hearing or visual impairment. 5. Diet and physical activities 6. Evidence for depression or mood disorder  Subjective:   Review of Systems:   Constitutional: Denies fever, malaise, fatigue, headache or abrupt weight changes.  HEENT: Denies eye pain, eye redness, ear pain, ringing in the ears, wax buildup, runny nose, nasal congestion, bloody nose, or sore throat. Respiratory: Denies difficulty breathing, shortness of breath, cough or sputum production.   Cardiovascular: Denies chest pain, chest tightness, palpitations or swelling in the hands or feet.  Gastrointestinal: Denies abdominal pain, bloating, constipation, diarrhea or blood in the stool.  GU: Denies urgency, frequency, pain with urination, burning sensation, blood in urine, odor or discharge. Musculoskeletal: Denies decrease in range of motion, difficulty with gait, muscle pain or joint pain and swelling.  Skin: Denies redness, rashes, lesions or ulcercations.  Neurological: Denies dizziness, difficulty with memory, difficulty with  speech or problems with balance and coordination.  Psych: Denies anxiety, depression, SI/HI.  No other specific complaints in a complete review of systems (except as listed in HPI above).  Objective:  PE:   BP 122/76   Pulse 73   Temp 97.9 F (36.6 C) (Temporal)   Ht _0  (1.575 m)   Wt 121 lb (54.9 kg)   LMP  (LMP Unknown)   SpO2 98%   BMI 22.13 kg/m   Wt Readings from Last 3 Encounters:  02/28/20 123 lb (55.8 kg)  02/11/20 125 lb 4 oz (56.8 kg)  07/28/19 125 lb (56.7 kg)  General: Appears her stated age, well developed, well nourished in NAD. Skin: Warm, dry and intact. No rashes noted. HEENT: Head: normal shape and size; Eyes: sclera white, no icterus, conjunctiva pink, PERRLA and EOMs intact;  Neck: Neck supple, trachea midline. No masses, lumps or thyromegaly present.  Cardiovascular: Normal rate and rhythm. S1,S2 noted.  No murmur, rubs or gallops noted. No JVD or BLE edema. No carotid bruits noted. Pulmonary/Chest: Normal effort and positive vesicular breath sounds. No respiratory distress. No wheezes, rales or ronchi noted.  Abdomen: Soft and nontender. Normal bowel sounds. No distention or masses noted. Liver, spleen and kidneys non palpable. Musculoskeletal: NStrength 5/5 BUE/BLE. No difficulty with gait.  Neurological: Alert and oriented. Cranial nerves II-XII grossly intact. Coordination normal.  Psychiatric: Mood and affect normal. Behavior is normal. Judgment and thought content normal.    BMET    Component Value Date/Time   NA 140 03/13/2020 1001   K 4.1 03/13/2020 1001   K 4.0 02/24/2012 0743   CL 104 03/13/2020 1001   CO2 26 03/13/2020 1001   GLUCOSE 124 (H) 03/13/2020 1001   BUN 13 03/13/2020 1001   CREATININE 0.83 03/13/2020 1001   CREATININE 0.83 02/19/2019 1452   CALCIUM 9.0 03/13/2020 1001   GFRNONAA >60 03/13/2020 1001   GFRAA >60 03/13/2020 1001    Lipid Panel     Component Value Date/Time   CHOL 169 05/03/2019 1453   TRIG 37.0  05/03/2019 1453   HDL 80.20 05/03/2019 1453   CHOLHDL 2 05/03/2019 1453   VLDL 7.4 05/03/2019 1453   LDLCALC 82 05/03/2019 1453    CBC    Component Value Date/Time   WBC 2.3 (L) 03/13/2020 1001   RBC 4.14 03/13/2020 1001   HGB 12.4 03/13/2020 1001   HGB 13.4 11/08/2014 1059   HCT 36.8 03/13/2020 1001   HCT 39.8 11/08/2014 1059   PLT 275 03/13/2020 1001   PLT 318 11/08/2014 1059   MCV 88.9 03/13/2020 1001   MCV 88 11/08/2014 1059   MCH 30.0 03/13/2020 1001   MCHC 33.7 03/13/2020 1001   RDW 12.2 03/13/2020 1001   RDW 12.7 11/08/2014 1059   LYMPHSABS 0.9 03/13/2020 1001   LYMPHSABS 1.3 11/08/2014 1059   MONOABS 0.2 03/13/2020 1001   MONOABS 0.3 11/08/2014 1059   EOSABS 0.1 03/13/2020 1001   EOSABS 0.2 11/08/2014 1059   BASOSABS 0.0 03/13/2020 1001   BASOSABS 0.0 11/08/2014 1059    Hgb A1C Lab Results  Component Value Date   HGBA1C 5.1 07/26/2015      Assessment and Plan:   Medicare Annual Wellness Visit:  Diet: She does eat lean meat. She consumes fruits and veggies daily. She tries to avoid fried foods. She drinks mostly water. Physical activity: Walking Depression/mood screen: Negative, PHQ 9 score of 0 Hearing: Intact to whispered voice Visual acuity: Grossly normal, performs annual eye exam  ADLs: Capable Fall risk: None Home safety: Good Cognitive evaluation: Intact to orientation, naming, recall and repetition EOL planning: Adv directives, full code/ I agree  Preventative Medicine: She will schedule nurse visit for her flu vaccine.  Tetanus, Pneumovax, Prevnar, Covid and Zostavax UTD.  She will consider Shingrix vaccine.  Her mammogram is scheduled.  She no longer wants to screen for cervical cancer.  Colon screening UTD.  Bone density UTD.  Encouraged her to consume a balanced diet and exercise regimen.  Advised her to see an eye doctor and dentist annually.  Will check CBC, C met, lipid and vitamin  D today.  Next appointment: 1 year, Medicare Wellness  Exam  Webb Silversmith, NP This visit occurred during the SARS-CoV-2 public health emergency.  Safety protocols were in place, including screening questions prior to the visit, additional usage of staff PPE, and extensive cleaning of exam room while observing appropriate contact time as indicated for disinfecting solutions.

## 2020-05-24 ENCOUNTER — Ambulatory Visit (INDEPENDENT_AMBULATORY_CARE_PROVIDER_SITE_OTHER): Payer: Medicare PPO

## 2020-05-24 DIAGNOSIS — Z23 Encounter for immunization: Secondary | ICD-10-CM | POA: Diagnosis not present

## 2020-06-12 ENCOUNTER — Other Ambulatory Visit: Payer: Self-pay | Admitting: Internal Medicine

## 2020-06-14 ENCOUNTER — Telehealth: Payer: Self-pay | Admitting: Internal Medicine

## 2020-06-14 NOTE — Telephone Encounter (Signed)
Patient came into office and stated she was given 20 bill for cpe on 10/4. Spoke with insurance and they stated they will cover. Stated it was coded as an office visit instead of cpe. Asking for it to be billed correctly as a cpe. If reference number needed use [id number]

## 2020-07-19 ENCOUNTER — Other Ambulatory Visit: Payer: Self-pay

## 2020-07-19 ENCOUNTER — Ambulatory Visit
Admission: RE | Admit: 2020-07-19 | Discharge: 2020-07-19 | Disposition: A | Discharge status: Home / Self Care | Payer: Medicare PPO | Source: Ambulatory Visit | Attending: Internal Medicine | Admitting: Internal Medicine

## 2020-07-19 ENCOUNTER — Ambulatory Visit: Payer: Medicare PPO

## 2020-07-19 DIAGNOSIS — C50211 Malignant neoplasm of upper-inner quadrant of right female breast: Secondary | ICD-10-CM | POA: Insufficient documentation

## 2020-07-19 DIAGNOSIS — Z170 Estrogen receptor positive status [ER+]: Secondary | ICD-10-CM

## 2020-07-26 ENCOUNTER — Ambulatory Visit: Payer: Medicare Other | Admitting: Surgery

## 2020-08-01 ENCOUNTER — Other Ambulatory Visit: Payer: Self-pay | Admitting: Internal Medicine

## 2020-08-01 DIAGNOSIS — G620 Drug-induced polyneuropathy: Secondary | ICD-10-CM

## 2020-09-12 ENCOUNTER — Telehealth: Payer: Self-pay

## 2020-09-12 DIAGNOSIS — Z170 Estrogen receptor positive status [ER+]: Secondary | ICD-10-CM

## 2020-09-12 DIAGNOSIS — C50211 Malignant neoplasm of upper-inner quadrant of right female breast: Secondary | ICD-10-CM

## 2020-09-12 NOTE — Telephone Encounter (Signed)
error 

## 2020-09-13 ENCOUNTER — Other Ambulatory Visit: Payer: Self-pay

## 2020-09-13 ENCOUNTER — Inpatient Hospital Stay: Payer: Medicare PPO | Attending: Internal Medicine

## 2020-09-13 ENCOUNTER — Inpatient Hospital Stay: Payer: Medicare PPO | Admitting: Internal Medicine

## 2020-09-13 VITALS — BP 139/77 | HR 68 | Temp 96.4°F | Resp 18 | Wt 123.0 lb

## 2020-09-13 DIAGNOSIS — C50211 Malignant neoplasm of upper-inner quadrant of right female breast: Secondary | ICD-10-CM

## 2020-09-13 DIAGNOSIS — Z79811 Long term (current) use of aromatase inhibitors: Secondary | ICD-10-CM

## 2020-09-13 DIAGNOSIS — Z13820 Encounter for screening for osteoporosis: Secondary | ICD-10-CM | POA: Diagnosis not present

## 2020-09-13 DIAGNOSIS — Z90710 Acquired absence of both cervix and uterus: Secondary | ICD-10-CM | POA: Insufficient documentation

## 2020-09-13 DIAGNOSIS — Z170 Estrogen receptor positive status [ER+]: Secondary | ICD-10-CM | POA: Insufficient documentation

## 2020-09-13 DIAGNOSIS — M8580 Other specified disorders of bone density and structure, unspecified site: Secondary | ICD-10-CM | POA: Insufficient documentation

## 2020-09-13 DIAGNOSIS — D708 Other neutropenia: Secondary | ICD-10-CM | POA: Insufficient documentation

## 2020-09-13 LAB — COMPREHENSIVE METABOLIC PANEL
ALT: 16 U/L (ref 0–44)
AST: 23 U/L (ref 15–41)
Albumin: 3.9 g/dL (ref 3.5–5.0)
Alkaline Phosphatase: 47 U/L (ref 38–126)
Anion gap: 9 (ref 5–15)
BUN: 12 mg/dL (ref 8–23)
CO2: 27 mmol/L (ref 22–32)
Calcium: 9 mg/dL (ref 8.9–10.3)
Chloride: 101 mmol/L (ref 98–111)
Creatinine, Ser: 0.8 mg/dL (ref 0.44–1.00)
GFR, Estimated: >60 mL/min (ref >60)
Glucose, Bld: 100 mg/dL — ABNORMAL HIGH (ref 70–99)
Potassium: 3.7 mmol/L (ref 3.5–5.1)
Sodium: 137 mmol/L (ref 135–145)
Total Bilirubin: 0.9 mg/dL (ref 0.3–1.2)
Total Protein: 6.6 g/dL (ref 6.5–8.1)

## 2020-09-13 LAB — CBC WITH DIFFERENTIAL/PLATELET
Abs Immature Granulocytes: 0 10*3/uL (ref 0.00–0.07)
Basophils Absolute: 0 10*3/uL (ref 0.0–0.1)
Basophils Relative: 2 %
Eosinophils Absolute: 0.1 10*3/uL (ref 0.0–0.5)
Eosinophils Relative: 4 %
HCT: 37.1 % (ref 36.0–46.0)
Hemoglobin: 12.7 g/dL (ref 12.0–15.0)
Immature Granulocytes: 0 %
Lymphocytes Relative: 38 %
Lymphs Abs: 1 10*3/uL (ref 0.7–4.0)
MCH: 30.9 pg (ref 26.0–34.0)
MCHC: 34.2 g/dL (ref 30.0–36.0)
MCV: 90.3 fL (ref 80.0–100.0)
Monocytes Absolute: 0.2 10*3/uL (ref 0.1–1.0)
Monocytes Relative: 10 %
Neutro Abs: 1.2 10*3/uL — ABNORMAL LOW (ref 1.7–7.7)
Neutrophils Relative %: 46 %
Platelets: 276 10*3/uL (ref 150–400)
RBC: 4.11 MIL/uL (ref 3.87–5.11)
RDW: 12.3 % (ref 11.5–15.5)
WBC: 2.5 10*3/uL — ABNORMAL LOW (ref 4.0–10.5)
nRBC: 0 % (ref 0.0–0.2)

## 2020-09-13 NOTE — Progress Notes (Signed)
[name] @ [facility] Telephone:(336) [phone]  Fax:(336) [phone]   INITIAL CONSULT  Sandra [name] OB: [birth year]  MR#: [id number]  GYI#:[id number]  Patient Care Team: Jearld Fenton, NP as PCP - General (Internal Medicine) Glennie Isle, PA-C (Physician Assistant) Pyrtle, Lajuan Lines, MD as Consulting Physician (Gastroenterology) Lucille Passy, MD (Inactive) as Consulting Physician (Family Medicine) Christene Lye, MD (General Surgery) Cammie Sickle, MD as Consulting Physician (Internal Medicine) Leona Singleton, RN as Oncology Nurse Navigator Noreene Filbert, MD as Referring Physician (Radiation Oncology)  CHIEF COMPLAINT:  Chief Complaint  Patient presents with  . Carcinoma of upper-inner quadrant of right breast in female    No new concerns     VISIT DIAGNOSIS:     ICD-10-CM   1. Aromatase inhibitor use  Z79.811 DG Bone Density  2. Carcinoma of upper-inner quadrant of right breast in female, estrogen receptor positive ([place])  C50.211    Z17.0   3. Osteoporosis screening  Z13.820 DG Bone Density     Oncology History Overview Note   carcinoma breast (right) inner and upper quadrant T1 cN0 M0 tumor Estrogen receptor positive progesterone receptor positive HER-2 receptor negative by fish Mammo print shows high risk (diagnosis in January of 2016) patient has 22% average risk in 5 years and 29% average risk in 10 years for distant metastectomy disease without chemotherapy on anti-hormonal treatment 2.  Patient started Cytoxan and Adriamycin on now August 30, 2015 MUGA scan shows ejection fraction of baseline 55%.  3.Patient has finished total of 4 cycles of chemotherapy on November 01, 2015 4.  Started on the Taxol from November 22, 2015; FINISHED RT [sep 2017] # OCT 2017- START ARIMIDEX   # NOV 2021 BMD- T score= - 1.9 -OSTEOPENIA- ca+vit D  # SURVIVORSHIP: O  --------------------------------------------------------   DIAGNOSIS: RIGHT BREAST  CA  STAGE: I        ;GOALS: cure  CURRENT/MOST RECENT THERAPY: on anastrazole      Carcinoma of upper-inner quadrant of right breast in female, estrogen receptor positive ([place])    INTERVAL HISTORY: 75 year old lady History of stage I right-sided breast cancer ER/PR positive currently on Arimidex is here for follow-up.  Patient denies any new onset of bone pain or joint pains.  Denies any weight loss.  Appetite is good.  She continues to be physically active.  Chronic mild tingling and numbness in extremities.  Review of Systems  Constitutional: Negative for chills, diaphoresis, fever, malaise/fatigue and weight loss.  HENT: Negative for nosebleeds and sore throat.   Eyes: Negative for double vision.  Respiratory: Negative for cough, hemoptysis, sputum production, shortness of breath and wheezing.   Cardiovascular: Negative for chest pain, palpitations, orthopnea and leg swelling.  Gastrointestinal: Negative for abdominal pain, blood in stool, constipation, diarrhea, heartburn, melena, nausea and vomiting.  Genitourinary: Negative for dysuria, frequency and urgency.  Musculoskeletal: Negative for back pain and joint pain.  Skin: Negative.  Negative for itching and rash.  Neurological: Positive for tingling. Negative for dizziness, focal weakness, weakness and headaches.  Endo/Heme/Allergies: Does not bruise/bleed easily.  Psychiatric/Behavioral: Negative for depression. The patient is not nervous/anxious and does not have insomnia.      PAST MEDICAL HISTORY: Past Medical History:  Diagnosis Date  . Breast cancer ([place]) 07/13/2015   RIGHT lumpectomy 07/13/15, pT1c, pN0,M0 Stage 1 Er/PR pos, her 2 neg. MammaPrint High  . Hyperlipidemia   . Hypertension   . Neuropathy   . Neuropathy 2018   toes  and fingers, due to chemotherapy  . Personal history of chemotherapy 2016  . Personal history of radiation therapy 2016  . Visual blurriness    SOMETHING NEW THAT HAS DEVELOPED SINCE  07-13-15 SURGERY- SEEN AT [facility]NT AND EXAM WAS NORMAL PER PT (08-09-15)    PAST SURGICAL HISTORY: Past Surgical History:  Procedure Laterality Date  . ABDOMINAL HYSTERECTOMY  1987  . BREAST BIOPSY Right    neg  . BREAST BIOPSY Left    4 oclock, FIBROEPITHELIAL LESION FAVOR CELLULAR FIBROADENOMA  . BREAST BIOPSY Left    2 oclock, CYSTIC APOCRINE METAPLASIA  . BREAST BIOPSY Right 07/13/15   130 INVASIVE MAMMARY CARCINOMA, NO SPECIAL TYPE  . BREAST EXCISIONAL BIOPSY Left 2016   fibroadenoma  . BREAST LUMPECTOMY Left 07/13/2015  . BREAST LUMPECTOMY WITH SENTINEL LYMPH NODE BIOPSY Right 07/13/2015   Procedure: BREAST LUMPECTOMY WITH SENTINEL LYMPH NODE BX;  Surgeon: Christene Lye, MD;  Location: [facility] ORS;  Service: General;  Laterality: Right;  . BUNIONECTOMY Right 1993  . CATARACT EXTRACTION W/PHACO Left 09/17/2016   Procedure: CATARACT EXTRACTION PHACO AND INTRAOCULAR LENS PLACEMENT (IOC);  Surgeon: Birder Robson, MD;  Location: [facility] ORS;  Service: Ophthalmology;  Laterality: Left;  [place] 00:47AP% 17.6CDE 8.39Fluid pack lot # [id number]  . CATARACT EXTRACTION W/PHACO Right 01/21/2017   Procedure: CATARACT EXTRACTION PHACO AND INTRAOCULAR LENS PLACEMENT (IOC);  Surgeon: Birder Robson, MD;  Location: [facility] ORS;  Service: Ophthalmology;  Laterality: Right;  [place] 00:39 AP% 13.0 CDE 5.16 fluid pack lot # [id number]  . COLONOSCOPY  2015  . fatty tumor Left 2013   side  . PORT-A-CATH REMOVAL  2017  . PORTACATH PLACEMENT Left 08/16/2015   Procedure: INSERTION PORT-A-CATH;  Surgeon: Christene Lye, MD;  Location: [facility] ORS;  Service: General;  Laterality: Left;    FAMILY HISTORY Family History  Problem Relation Age of Onset  . Cancer Sister 50       breast cancer  . Breast cancer Sister 57  . Colon cancer Neg Hx         ADVANCED DIRECTIVES:   Patient does not have any living will or healthcare power of attorney.  Information was given .  Available resources had been  discussed.  We will follow-up on subsequent appointments regarding this issue HEALTH MAINTENANCE: Social History   Tobacco Use  . Smoking status: Never Smoker  . Smokeless tobacco: Never Used  Vaping Use  . Vaping Use: Never used  Substance Use Topics  . Alcohol use: No    Alcohol/week: 0.0 standard drinks  . Drug use: No      :  Allergies  Allergen Reactions  . Cymbalta [Duloxetine Hcl] Other (See Comments)    Glaucoma.  Unable to take  . Lovastatin Anaphylaxis    Tongue swelling    Current Outpatient Medications  Medication Sig Dispense Refill  . amLODipine (NORVASC) 5 MG tablet Take 1 tablet by mouth once daily 90 tablet 2  . anastrozole (ARIMIDEX) 1 MG tablet Take 1 tablet by mouth once daily 90 tablet 3  . b complex vitamins tablet Take 1 tablet by mouth daily.    Marland Kitchen BIOTIN PO Take 5,000 mcg by mouth daily.    . Calcium Carbonate-Vitamin D (CALCIUM 500 + D PO) Take 2 tablets by mouth daily.    . COD LIVER OIL/VITAMINS A & D CAPS Take by mouth.    . Flaxseed, Linseed, (FLAX SEEDS PO) Take by mouth daily.    Marland Kitchen gabapentin (NEURONTIN)  300 MG capsule TAKE 1 CAPSULE BY MOUTH THREE TIMES DAILY 270 capsule 1  . Garlic 4128 MG CAPS Take by mouth.    Marland Kitchen lisinopril (ZESTRIL) 10 MG tablet Take 1 tablet by mouth once daily 90 tablet 1  . PAPAYA ENZYMES PO Take by mouth daily.    . Potassium 99 MG TABS Take 99 mg by mouth daily.     . Turmeric 500 MG CAPS Take 1 capsule by mouth daily.     No current facility-administered medications for this visit.    OBJECTIVE: Physical Exam HENT:     Head: Normocephalic and atraumatic.     Mouth/Throat:     Pharynx: No oropharyngeal exudate.  Eyes:     Pupils: Pupils are equal, round, and reactive to light.  Cardiovascular:     Rate and Rhythm: Normal rate and regular rhythm.  Pulmonary:     Effort: No respiratory distress.     Breath sounds: No wheezing.  Abdominal:     General: Bowel sounds are normal. There is no distension.      Palpations: Abdomen is soft. There is no mass.     Tenderness: There is no abdominal tenderness. There is no guarding or rebound.  Musculoskeletal:        General: No tenderness. Normal range of motion.     Cervical back: Normal range of motion and neck supple.  Skin:    General: Skin is warm.     Comments: Right and left BREAST exam (in the presence of nurse)- no unusual skin changes or dominant masses felt. Surgical scars noted.    Neurological:     Mental Status: She is alert and oriented to person, place, and time.  Psychiatric:        Mood and Affect: Affect normal.       Vitals:   09/13/20 1036  BP: 139/77  Pulse: 68  Resp: 18  Temp: (!) 96.4 F (35.8 C)  SpO2: 100%     Body mass index is 22.5 kg/m.    ECOG FS:0 - Asymptomatic  LAB RESULTS:  Appointment on 09/13/2020  Component Date Value Ref Range Status  . Sodium 09/13/2020 137  135 - 145 mmol/L Final  . Potassium 09/13/2020 3.7  3.5 - 5.1 mmol/L Final  . Chloride 09/13/2020 101  98 - 111 mmol/L Final  . CO2 09/13/2020 27  22 - 32 mmol/L Final  . Glucose, Bld 09/13/2020 100* 70 - 99 mg/dL Final   Glucose reference range applies only to samples taken after fasting for at least 8 hours.  . BUN 09/13/2020 12  8 - 23 mg/dL Final  . Creatinine, Ser 09/13/2020 0.80  0.44 - 1.00 mg/dL Final  . Calcium 09/13/2020 9.0  8.9 - 10.3 mg/dL Final  . Total Protein 09/13/2020 6.6  6.5 - 8.1 g/dL Final  . Albumin 09/13/2020 3.9  3.5 - 5.0 g/dL Final  . AST 09/13/2020 23  15 - 41 U/L Final  . ALT 09/13/2020 16  0 - 44 U/L Final  . Alkaline Phosphatase 09/13/2020 47  38 - 126 U/L Final  . Total Bilirubin 09/13/2020 0.9  0.3 - 1.2 mg/dL Final  . GFR, Estimated 09/13/2020 >60  >60 mL/min Final   Comment: (NOTE) Calculated using the CKD-EPI Creatinine Equation (2021)   . Anion gap 09/13/2020 9  5 - 15 Final   Performed at [facility], [address] [place], [place] [address]  . WBC 09/13/2020 2.5* 4.0 - 10.5 K/uL  Final  . RBC 09/13/2020 4.11  3.87 - 5.11 MIL/uL Final  . Hemoglobin 09/13/2020 12.7  12.0 - 15.0 g/dL Final  . HCT 09/13/2020 37.1  36.0 - 46.0 % Final  . MCV 09/13/2020 90.3  80.0 - 100.0 fL Final  . MCH 09/13/2020 30.9  26.0 - 34.0 pg Final  . MCHC 09/13/2020 34.2  30.0 - 36.0 g/dL Final  . RDW 09/13/2020 12.3  11.5 - 15.5 % Final  . Platelets 09/13/2020 276  150 - 400 K/uL Final  . nRBC 09/13/2020 0.0  0.0 - 0.2 % Final  . Neutrophils Relative % 09/13/2020 46  % Final  . Neutro Abs 09/13/2020 1.2* 1.7 - 7.7 K/uL Final  . Lymphocytes Relative 09/13/2020 38  % Final  . Lymphs Abs 09/13/2020 1.0  0.7 - 4.0 K/uL Final  . Monocytes Relative 09/13/2020 10  % Final  . Monocytes Absolute 09/13/2020 0.2  0.1 - 1.0 K/uL Final  . Eosinophils Relative 09/13/2020 4  % Final  . Eosinophils Absolute 09/13/2020 0.1  0.0 - 0.5 K/uL Final  . Basophils Relative 09/13/2020 2  % Final  . Basophils Absolute 09/13/2020 0.0  0.0 - 0.1 K/uL Final  . Immature Granulocytes 09/13/2020 0  % Final  . Abs Immature Granulocytes 09/13/2020 0.00  0.00 - 0.07 K/uL Final   Performed at [facility], [address] [place], [place] [address]     STUDIES: No results found.  ASSESSMENT:   Carcinoma of upper-inner quadrant of right breast in female, estrogen receptor positive ([place]) # Stage I ER/PR positive HER-2/neu negative;  On Anastrazole.  Stable.  Tolerating very well.  Discussed the role of extended AI up to 10 years given her need for chemotherapy /high risk disease.  Patient is willing to proceed with extended aromatase inhibitor.    # No clinical evidence of recurrence.  Continue anastrozole at this time.  Tolerating without any major side effects.   # Osteopenia- BMD Nov 2020; T score= -.1.9; STABLE;  Continue calcium and vitamin D; reviewed the BMD.  Order bone density prior to next visit.  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  # Grade 2 neuropathy-STABLE;   Neurontin 300 mg TID.   # DISPOSITION:  # follow up in 6 months-MD/labs- cbc/cmp; BMD prior-Dr.B.   No matching staging information was found for the patient.  Cammie Sickle, MD   09/13/2020 1:09 PM

## 2020-09-13 NOTE — Assessment & Plan Note (Addendum)
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  Stable.  Tolerating very well.  Discussed the role of extended AI up to 10 years given her need for chemotherapy /high risk disease.  Patient is willing to proceed with extended aromatase inhibitor.    # No clinical evidence of recurrence.  Continue anastrozole at this time.  Tolerating without any major side effects.   # Osteopenia- BMD Nov 2020; T score= -.1.9; STABLE;  Continue calcium and vitamin D; reviewed the BMD.  Order bone density prior to next visit.  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  # Grade 2 neuropathy-STABLE;  Neurontin 300 mg TID.   # DISPOSITION:  # follow up in 6 months-MD/labs- cbc/cmp; BMD prior-Dr.B.

## 2020-09-27 ENCOUNTER — Other Ambulatory Visit: Payer: Self-pay

## 2020-09-27 ENCOUNTER — Ambulatory Visit
Admission: RE | Admit: 2020-09-27 | Discharge: 2020-09-27 | Disposition: A | Discharge status: Home / Self Care | Payer: Medicare PPO | Source: Ambulatory Visit | Attending: Internal Medicine | Admitting: Internal Medicine

## 2020-09-27 DIAGNOSIS — Z780 Asymptomatic menopausal state: Secondary | ICD-10-CM | POA: Insufficient documentation

## 2020-09-27 DIAGNOSIS — Z853 Personal history of malignant neoplasm of breast: Secondary | ICD-10-CM | POA: Diagnosis not present

## 2020-09-27 DIAGNOSIS — Z9221 Personal history of antineoplastic chemotherapy: Secondary | ICD-10-CM | POA: Diagnosis not present

## 2020-09-27 DIAGNOSIS — Z79811 Long term (current) use of aromatase inhibitors: Secondary | ICD-10-CM

## 2020-09-27 DIAGNOSIS — M8589 Other specified disorders of bone density and structure, multiple sites: Secondary | ICD-10-CM | POA: Diagnosis not present

## 2020-09-27 DIAGNOSIS — Z13820 Encounter for screening for osteoporosis: Secondary | ICD-10-CM | POA: Diagnosis present

## 2020-09-27 DIAGNOSIS — Z923 Personal history of irradiation: Secondary | ICD-10-CM | POA: Insufficient documentation

## 2020-10-10 NOTE — Telephone Encounter (Signed)
Unfortunately, we are unable to change coding. It does look as if everything was filed correctly. Patient was advised.

## 2020-11-20 ENCOUNTER — Other Ambulatory Visit: Payer: Self-pay

## 2020-11-20 DIAGNOSIS — Z170 Estrogen receptor positive status [ER+]: Secondary | ICD-10-CM

## 2020-11-20 DIAGNOSIS — Z79811 Long term (current) use of aromatase inhibitors: Secondary | ICD-10-CM

## 2020-11-20 DIAGNOSIS — C50211 Malignant neoplasm of upper-inner quadrant of right female breast: Secondary | ICD-10-CM

## 2020-11-22 ENCOUNTER — Other Ambulatory Visit: Payer: Self-pay | Admitting: Internal Medicine

## 2020-11-22 ENCOUNTER — Telehealth: Payer: Self-pay | Admitting: Oncology

## 2020-11-22 ENCOUNTER — Telehealth: Payer: Self-pay | Admitting: Internal Medicine

## 2020-11-22 DIAGNOSIS — Z170 Estrogen receptor positive status [ER+]: Secondary | ICD-10-CM

## 2020-11-22 DIAGNOSIS — C50211 Malignant neoplasm of upper-inner quadrant of right female breast: Secondary | ICD-10-CM

## 2020-11-22 DIAGNOSIS — Z95828 Presence of other vascular implants and grafts: Secondary | ICD-10-CM

## 2020-11-22 NOTE — Telephone Encounter (Signed)
FYI

## 2020-11-22 NOTE — Telephone Encounter (Signed)
Ok to cancel BMD in aug.  GB

## 2020-11-22 NOTE — Telephone Encounter (Signed)
Patient was reviewing her MyChart appointments and would like clarification on whether Bone Density is needed in August 2022, she states she just had one in February 2022. Routing to team to please advise.

## 2020-11-22 NOTE — Telephone Encounter (Signed)
Dr. B patient already had a bone density in Feb. Can we cnl the apt in August

## 2020-12-21 ENCOUNTER — Encounter: Payer: Medicare PPO | Admitting: Adult Health

## 2021-01-23 ENCOUNTER — Other Ambulatory Visit: Payer: Self-pay | Admitting: Internal Medicine

## 2021-01-23 DIAGNOSIS — T451X5A Adverse effect of antineoplastic and immunosuppressive drugs, initial encounter: Secondary | ICD-10-CM

## 2021-01-23 DIAGNOSIS — G620 Drug-induced polyneuropathy: Secondary | ICD-10-CM

## 2021-01-23 NOTE — Telephone Encounter (Signed)
   Notes to clinic Pt not in our practice.

## 2021-01-29 ENCOUNTER — Ambulatory Visit (INDEPENDENT_AMBULATORY_CARE_PROVIDER_SITE_OTHER): Payer: Medicare PPO

## 2021-01-29 ENCOUNTER — Ambulatory Visit
Admission: RE | Admit: 2021-01-29 | Discharge: 2021-01-29 | Disposition: A | Discharge status: Home / Self Care | Payer: Medicare PPO | Source: Ambulatory Visit | Attending: Emergency Medicine | Admitting: Emergency Medicine

## 2021-01-29 ENCOUNTER — Other Ambulatory Visit: Payer: Self-pay

## 2021-01-29 VITALS — BP 168/87 | HR 79 | Temp 98.0°F | Resp 14

## 2021-01-29 DIAGNOSIS — I10 Essential (primary) hypertension: Secondary | ICD-10-CM | POA: Diagnosis not present

## 2021-01-29 DIAGNOSIS — M7989 Other specified soft tissue disorders: Secondary | ICD-10-CM | POA: Diagnosis not present

## 2021-01-29 DIAGNOSIS — M79645 Pain in left finger(s): Secondary | ICD-10-CM | POA: Diagnosis not present

## 2021-01-29 MED ORDER — NAPROXEN 500 MG PO TABS: 500.0000 mg | ORAL_TABLET | Freq: Two times a day (BID) | ORAL | 0 refills | Status: AC

## 2021-01-29 NOTE — ED Triage Notes (Signed)
Pt presents today with c/o of lump to left index finger x 4 days. She denies injury.

## 2021-01-29 NOTE — ED Provider Notes (Signed)
Skains Cerner, neuropathy Roderic Palau    CSN: [id number] Arrival date & time: 01/29/21  0931      History   Chief Complaint Chief Complaint  Patient presents with   Hand Pain    Left 2nd finger    HPI Sandra [name] is a 75 y.o. female.  Patient presents with a painful "lump" on her left index finger since last week.  No known injury.  It is only painful if she pushes on it.  She denies redness, bruising, wounds, numbness, weakness, paresthesias, or other symptoms.  Treatment attempted at home with rubbing alcohol.  Her medical history includes hypertension, breast cancer, neuropathy.  The history is provided by the patient and medical records.   Past Medical History:  Diagnosis Date   Breast cancer ([place]) 07/13/2015   RIGHT lumpectomy 07/13/15, pT1c, pN0,M0 Stage 1 Er/PR pos, her 2 neg. 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   Hypertension    Hyperlipidemia     Past Surgical History:  Procedure Laterality Date   ABDOMINAL HYSTERECTOMY  1987   BREAST BIOPSY Right    neg   BREAST BIOPSY Left    4 oclock, FIBROEPITHELIAL LESION FAVOR CELLULAR FIBROADENOMA   BREAST BIOPSY Left    2 oclock, CYSTIC APOCRINE METAPLASIA   BREAST BIOPSY Right 07/13/15   130 INVASIVE MAMMARY CARCINOMA, NO SPECIAL TYPE   BREAST EXCISIONAL BIOPSY Left 2016   fibroadenoma   BREAST LUMPECTOMY Left 07/13/2015   BREAST LUMPECTOMY WITH SENTINEL LYMPH NODE  BIOPSY Right 07/13/2015   Procedure: BREAST LUMPECTOMY WITH SENTINEL LYMPH NODE BX;  Surgeon: Christene Lye, MD;  Location: [facility] ORS;  Service: General;  Laterality: Right;   BUNIONECTOMY Right 1993   CATARACT EXTRACTION W/PHACO Left 09/17/2016   Procedure: CATARACT EXTRACTION PHACO AND INTRAOCULAR LENS PLACEMENT (Advance);  Surgeon: Birder Robson, MD;  Location: [facility] ORS;  Service: Ophthalmology;  Laterality: Left;  [place] 00:47AP% 17.6CDE 8.39Fluid pack lot # [id number]   CATARACT EXTRACTION W/PHACO Right 01/21/2017   Procedure: CATARACT EXTRACTION PHACO AND INTRAOCULAR LENS PLACEMENT (IOC);  Surgeon: Birder Robson, MD;  Location: [facility] ORS;  Service: Ophthalmology;  Laterality: Right;  [place] 00:39 AP% 13.0 CDE 5.16 fluid pack lot # [id number]   COLONOSCOPY  2015   fatty tumor Left 2013   side   PORT-A-CATH REMOVAL  2017   PORTACATH PLACEMENT Left 08/16/2015   Procedure: INSERTION PORT-A-CATH;  Surgeon: Christene Lye, MD;  Location: [facility] ORS;  Service: General;  Laterality: Left;    OB History   No obstetric history on file.      Home Medications    Prior to Admission medications   Medication Sig Start Date End Date Taking? Authorizing Provider  naproxen (NAPROSYN) 500 MG tablet Take 1 tablet (500 mg total) by mouth 2 (two) times daily for 7 days. 01/29/21 02/05/21 Yes Sharion Balloon, NP  amLODipine (NORVASC) 5 MG tablet Take 1 tablet  by mouth once daily 06/13/20   Jearld Fenton, NP  anastrozole (ARIMIDEX) 1 MG tablet Take 1 tablet by mouth once daily 11/22/20   Cammie Sickle, MD  b complex vitamins tablet Take 1 tablet by mouth daily.    [provider]  BIOTIN PO Take 5,000 mcg by mouth daily.    [provider]  Calcium Carbonate-Vitamin D (CALCIUM 500 + D PO) Take 2 tablets by mouth daily.    [provider]  COD LIVER OIL/VITAMINS A & D CAPS Take by mouth.    [provider]  Flaxseed, Linseed, (FLAX SEEDS PO) Take by mouth daily.     [provider]  gabapentin (NEURONTIN) 300 MG capsule TAKE 1 CAPSULE BY MOUTH THREE TIMES DAILY 08/02/20   Jearld Fenton, NP  Garlic 5329 MG CAPS Take by mouth.    [provider]  lisinopril (ZESTRIL) 10 MG tablet Take 1 tablet by mouth once daily 08/02/20   Jearld Fenton, NP  PAPAYA ENZYMES PO Take by mouth daily.    [provider]  Potassium 99 MG TABS Take 99 mg by mouth daily.     [provider]  Turmeric 500 MG CAPS Take 1 capsule by mouth daily.    [provider]  Calcium Carbonate (CALCIUM 500 PO) Take 1 tablet by mouth daily.    07/10/15  [provider]    Family History Family History  Problem Relation Age of Onset   Cancer Sister 75       breast cancer   Breast cancer Sister 58   Colon cancer Neg Hx     Social History Social History   Tobacco Use   Smoking status: Never   Smokeless tobacco: Never  Vaping Use   Vaping Use: Never used  Substance Use Topics   Alcohol use: No    Alcohol/week: 0.0 standard drinks   Drug use: No     Allergies   Cymbalta [duloxetine hcl] and Lovastatin   Review of Systems Review of Systems  Constitutional:  Negative for chills and fever.  Respiratory:  Negative for cough and shortness of breath.   Cardiovascular:  Negative for chest pain and palpitations.  Gastrointestinal:  Negative for abdominal pain and vomiting.  Musculoskeletal:  Positive for arthralgias and joint swelling.  Skin:  Negative for color change and rash.  Neurological:  Negative for weakness and numbness.  All other systems reviewed and are negative.   Physical Exam Triage Vital Signs ED Triage Vitals  Enc Vitals Group     BP      Pulse      Resp      Temp      Temp src      SpO2      Weight      Height      Head Circumference      Peak Flow      Pain Score      Pain Loc      Pain Edu?      Excl. in [place]?    No data found.  Updated Vital Signs BP (!) 168/87 (BP Location: Left Arm)    Pulse 79   Temp 98 F (36.7 C) (Oral)   Resp 14   LMP  (LMP Unknown)   SpO2 97%   Visual Acuity Right Eye Distance:   Left Eye Distance:   Bilateral Distance:    Right Eye Near:   Left Eye Near:  Bilateral Near:     Physical Exam Vitals and nursing note reviewed.  Constitutional:      General: She is not in acute distress.    Appearance: She is well-developed. She is not ill-appearing.  HENT:     Head: Normocephalic and atraumatic.     Mouth/Throat:     Mouth: Mucous membranes are moist.  Eyes:     Conjunctiva/sclera: Conjunctivae normal.  Cardiovascular:     Rate and Rhythm: Normal rate and regular rhythm.     Heart sounds: Normal heart sounds.  Pulmonary:     Effort: Pulmonary effort is normal. No respiratory distress.     Breath sounds: Normal breath sounds.  Abdominal:     Palpations: Abdomen is soft.     Tenderness: There is no abdominal tenderness.  Musculoskeletal:        General: Swelling and tenderness present. No deformity or signs of injury. Normal range of motion.     Cervical back: Neck supple.     Comments: Left index finger: Mild generalized edema of PIP joint.  Small palpable cyst at lateral PIP.  No wounds, erythema, ecchymosis.  FROM, strength 5/5, sensation intact, brisk capillary refill.  Skin:    General: Skin is warm and dry.     Capillary Refill: Capillary refill takes less than 2 seconds.     Findings: No bruising, erythema, lesion or rash.  Neurological:     General: No focal deficit present.     Mental Status: She is alert and oriented to person, place, and time.     Sensory: No sensory deficit.     Motor: No weakness.     Gait: Gait normal.  Psychiatric:        Mood and Affect: Mood normal.        Behavior: Behavior normal.     UC Treatments / Results  Labs (all labs ordered are listed, but only abnormal results are displayed) Labs Reviewed - No data to display  EKG   Radiology DG Finger Index Left  Result Date:  01/29/2021 CLINICAL DATA:  Second digit pain and swelling, no known injury, initial encounter EXAM: LEFT INDEX FINGER 2+V COMPARISON:  None. FINDINGS: No acute fracture or dislocation is noted. Very mild degenerative changes at the second PIP joint are seen. Mild soft tissue swelling is noted as well. IMPRESSION: Mild degenerative changes with soft tissue swelling. Electronically Signed   By: Inez Catalina M.D.   On: 01/29/2021 10:04    Procedures Procedures (including critical care time)  Medications Ordered in UC Medications - No data to display  Initial Impression / Assessment and Plan / UC Course  I have reviewed the triage vital signs and the nursing notes.  Pertinent labs & imaging results that were available during my care of the patient were reviewed by me and considered in my medical decision making (see chart for details).  Swelling of left index finger.  Elevated blood pressure reading with known hypertension.  X-ray of left index finger shows mild soft tissue swelling.  Treating with naproxen.  Instructed patient to follow-up with an orthopedic hand specialist.  Also discussed that her blood pressure is elevated today needs to be rechecked by her PCP in 2 to 4 weeks.  She agrees to plan of care.    Final Clinical Impressions(s) / UC Diagnoses   Final diagnoses:  Swelling of left index finger  Elevated blood pressure reading in office with diagnosis of hypertension     Discharge Instructions  Take the naproxen as directed.  Follow up with an orthopedic hand specialist.   Your blood pressure is elevated today at 168/87.  Please have this rechecked by your primary care provider in 2-4 weeks.          ED Prescriptions     Medication Sig Dispense Auth. Provider   naproxen (NAPROSYN) 500 MG tablet Take 1 tablet (500 mg total) by mouth 2 (two) times daily for 7 days. 14 tablet Sharion Balloon, NP      PDMP not reviewed this encounter.   Sharion Balloon, NP 01/29/21  1016

## 2021-01-29 NOTE — Discharge Instructions (Addendum)
Take the naproxen as directed.  Follow up with an orthopedic hand specialist.   Your blood pressure is elevated today at 168/87.  Please have this rechecked by your primary care provider in 2-4 weeks.

## 2021-01-30 ENCOUNTER — Encounter: Payer: Self-pay | Admitting: Family Medicine

## 2021-01-30 ENCOUNTER — Other Ambulatory Visit: Payer: Self-pay

## 2021-01-30 ENCOUNTER — Ambulatory Visit: Payer: Medicare PPO | Admitting: Family Medicine

## 2021-01-30 DIAGNOSIS — T451X5A Adverse effect of antineoplastic and immunosuppressive drugs, initial encounter: Secondary | ICD-10-CM

## 2021-01-30 DIAGNOSIS — I10 Essential (primary) hypertension: Secondary | ICD-10-CM

## 2021-01-30 DIAGNOSIS — G620 Drug-induced polyneuropathy: Secondary | ICD-10-CM

## 2021-01-30 MED ORDER — AMLODIPINE BESYLATE 5 MG PO TABS: 5.0000 mg | ORAL_TABLET | Freq: Every day | ORAL | 0 refills | Status: DC

## 2021-01-30 MED ORDER — LISINOPRIL 10 MG PO TABS: 10.0000 mg | ORAL_TABLET | Freq: Every day | ORAL | 0 refills | Status: DC

## 2021-01-30 MED ORDER — GABAPENTIN 300 MG PO CAPS: 300.0000 mg | ORAL_CAPSULE | Freq: Three times a day (TID) | ORAL | 0 refills | Status: DC

## 2021-01-30 NOTE — Assessment & Plan Note (Signed)
Moderate control on gabapentin.

## 2021-01-30 NOTE — Patient Instructions (Signed)
Follow BP at home .. keep measurement and send in after 2-3 weeks.  Continue current meds.  Keep appt with  hand specialist.

## 2021-01-30 NOTE — Progress Notes (Signed)
Patient ID: Sandra [name], female    DOB: 1946/02/24, 75 y.o.   MRN: [id number]  This visit was conducted in person.  Temp 98.4 F (36.9 C) (Temporal)   Ht 5\' 2"  (1.575 m)   Wt 122 lb (55.3 kg)   LMP  (LMP Unknown)   BMI 22.31 kg/m    CC: Chief Complaint  Patient presents with   Hypertension    Seen at [facility] yesterday for finger but was told her BP was elevated    Subjective:   HPI: Sandra [name] is a 75 y.o. female presenting on 01/30/2021 for Hypertension (Seen at [facility] yesterday for finger but was told her BP was elevated)  Previous MD: Webb Silversmith.. will be set up with PCP once new provider at office.  She has history of HTN. On amlodipine 5 mg daily and lisinopril 10 mg daily.   She was seen at Urgent Care on 6/27.. incidentally found BP to be elevated She has been treating a finger pan/swelling with NSAIDs.. Urgent Care referred her to hand specialist.   BP in office today 140/74  At home yesterday 120/72-142/81.  No CP, no SOB, no lightheadedness. BP Readings from Last 3 Encounters:  01/29/21 (!) 168/87  09/13/20 139/77  05/08/20 122/76   She  is very active, walks 3 miles a day.  She needs a refill of gabapentin for her neuropathy from chemo.   No change in weight, no caffeine, minimal salt, no increase in stress.     Relevant past medical, surgical, family and social history reviewed and updated as indicated. Interim medical history since our last visit reviewed. Allergies and medications reviewed and updated. Outpatient Medications Prior to Visit  Medication Sig Dispense Refill   amLODipine (NORVASC) 5 MG tablet Take 1 tablet by mouth once daily 90 tablet 2   anastrozole (ARIMIDEX) 1 MG tablet Take 1 tablet by mouth once daily 90 tablet 0   b complex vitamins tablet Take 1 tablet by mouth daily.     BIOTIN PO Take 5,000 mcg by mouth daily.     Calcium Carbonate-Vitamin D (CALCIUM 500 + D PO) Take 2 tablets by mouth daily.     COD  LIVER OIL/VITAMINS A & D CAPS Take by mouth.     Flaxseed, Linseed, (FLAX SEEDS PO) Take by mouth daily.     gabapentin (NEURONTIN) 300 MG capsule TAKE 1 CAPSULE BY MOUTH THREE TIMES DAILY 631 capsule 1   Garlic 4970 MG CAPS Take by mouth.     lisinopril (ZESTRIL) 10 MG tablet Take 1 tablet by mouth once daily 90 tablet 1   naproxen (NAPROSYN) 500 MG tablet Take 1 tablet (500 mg total) by mouth 2 (two) times daily for 7 days. 14 tablet 0   PAPAYA ENZYMES PO Take by mouth daily.     Potassium 99 MG TABS Take 99 mg by mouth daily.      Turmeric 500 MG CAPS Take 1 capsule by mouth daily.     No facility-administered medications prior to visit.     Per HPI unless specifically indicated in ROS section below Review of Systems  Constitutional:  Negative for fatigue and fever.  HENT:  Negative for ear pain.   Eyes:  Negative for pain.  Respiratory:  Negative for chest tightness and shortness of breath.   Cardiovascular:  Negative for chest pain, palpitations and leg swelling.  Gastrointestinal:  Negative for abdominal pain.  Genitourinary:  Negative for dysuria.  Objective:  Temp 98.4 F (36.9 C) (Temporal)   Ht 5\' 2"  (1.575 m)   Wt 122 lb (55.3 kg)   LMP  (LMP Unknown)   BMI 22.31 kg/m   Wt Readings from Last 3 Encounters:  01/30/21 122 lb (55.3 kg)  09/13/20 123 lb (55.8 kg)  05/08/20 121 lb (54.9 kg)      Physical Exam Constitutional:      General: She is not in acute distress.    Appearance: Normal appearance. She is well-developed. She is not ill-appearing or toxic-appearing.  HENT:     Head: Normocephalic.     Right Ear: Hearing, tympanic membrane, ear canal and external ear normal. Tympanic membrane is not erythematous, retracted or bulging.     Left Ear: Hearing, tympanic membrane, ear canal and external ear normal. Tympanic membrane is not erythematous, retracted or bulging.     Nose: No mucosal edema or rhinorrhea.     Right Sinus: No maxillary sinus tenderness or  frontal sinus tenderness.     Left Sinus: No maxillary sinus tenderness or frontal sinus tenderness.     Mouth/Throat:     Pharynx: Uvula midline.  Eyes:     General: Lids are normal. Lids are everted, no foreign bodies appreciated.     Conjunctiva/sclera: Conjunctivae normal.     Pupils: Pupils are equal, round, and reactive to light.  Neck:     Thyroid: No thyroid mass or thyromegaly.     Vascular: No carotid bruit.     Trachea: Trachea normal.  Cardiovascular:     Rate and Rhythm: Normal rate and regular rhythm.     Pulses: Normal pulses.     Heart sounds: Normal heart sounds, S1 normal and S2 normal. No murmur heard.   No friction rub. No gallop.  Pulmonary:     Effort: Pulmonary effort is normal. No tachypnea or respiratory distress.     Breath sounds: Normal breath sounds. No decreased breath sounds, wheezing, rhonchi or rales.  Abdominal:     General: Bowel sounds are normal.     Palpations: Abdomen is soft.     Tenderness: There is no abdominal tenderness.  Musculoskeletal:     Cervical back: Normal range of motion and neck supple.  Skin:    General: Skin is warm and dry.     Findings: No rash.  Neurological:     Mental Status: She is alert.  Psychiatric:        Mood and Affect: Mood is not anxious or depressed.        Speech: Speech normal.        Behavior: Behavior normal. Behavior is cooperative.        Thought Content: Thought content normal.        Judgment: Judgment normal.      Results for orders placed or performed in visit on 09/13/20  Comprehensive metabolic panel  Result Value Ref Range   Sodium 137 135 - 145 mmol/L   Potassium 3.7 3.5 - 5.1 mmol/L   Chloride 101 98 - 111 mmol/L   CO2 27 22 - 32 mmol/L   Glucose, Bld 100 (H) 70 - 99 mg/dL   BUN 12 8 - 23 mg/dL   Creatinine, Ser 0.80 0.44 - 1.00 mg/dL   Calcium 9.0 8.9 - 10.3 mg/dL   Total Protein 6.6 6.5 - 8.1 g/dL   Albumin 3.9 3.5 - 5.0 g/dL   AST 23 15 - 41 U/L   ALT 16 0 - 44 U/L  Alkaline  Phosphatase 47 38 - 126 U/L   Total Bilirubin 0.9 0.3 - 1.2 mg/dL   GFR, Estimated >60 >60 mL/min   Anion gap 9 5 - 15  CBC with Differential/Platelet  Result Value Ref Range   WBC 2.5 (L) 4.0 - 10.5 K/uL   RBC 4.11 3.87 - 5.11 MIL/uL   Hemoglobin 12.7 12.0 - 15.0 g/dL   HCT 37.1 36.0 - 46.0 %   MCV 90.3 80.0 - 100.0 fL   MCH 30.9 26.0 - 34.0 pg   MCHC 34.2 30.0 - 36.0 g/dL   RDW 12.3 11.5 - 15.5 %   Platelets 276 150 - 400 K/uL   nRBC 0.0 0.0 - 0.2 %   Neutrophils Relative % 46 %   Neutro Abs 1.2 (L) 1.7 - 7.7 K/uL   Lymphocytes Relative 38 %   Lymphs Abs 1.0 0.7 - 4.0 K/uL   Monocytes Relative 10 %   Monocytes Absolute 0.2 0.1 - 1.0 K/uL   Eosinophils Relative 4 %   Eosinophils Absolute 0.1 0.0 - 0.5 K/uL   Basophils Relative 2 %   Basophils Absolute 0.0 0.0 - 0.1 K/uL   Immature Granulocytes 0 %   Abs Immature Granulocytes 0.00 0.00 - 0.07 K/uL    This visit occurred during the SARS-CoV-2 public health emergency.  Safety protocols were in place, including screening questions prior to the visit, additional usage of staff PPE, and extensive cleaning of exam room while observing appropriate contact time as indicated for disinfecting solutions.   COVID 19 screen:  No recent travel or known exposure to COVID19 The patient denies respiratory symptoms of COVID 19 at this time. The importance of social distancing was discussed today.   Assessment and Plan Problem List Items Addressed This Visit     Hypertension    Improved control.. continue current meds. Follow over next few weeks and call with measurements.. may need med increase if staying > 140/90       Relevant Medications   amLODipine (NORVASC) 5 MG tablet   lisinopril (ZESTRIL) 10 MG tablet   Neuropathy due to chemotherapeutic drug ([facility])     Moderate control on gabapentin.       Other Visit Diagnoses     Chemotherapy-induced neuropathy ([place])       Relevant Medications   gabapentin (NEURONTIN) 300 MG capsule           Eliezer Lofts, MD

## 2021-01-30 NOTE — Assessment & Plan Note (Signed)
Improved control.. continue current meds. Follow over next few weeks and call with measurements.. may need med increase if staying > 140/90

## 2021-02-20 ENCOUNTER — Other Ambulatory Visit: Payer: Self-pay | Admitting: Internal Medicine

## 2021-02-20 ENCOUNTER — Other Ambulatory Visit: Payer: Self-pay | Admitting: Family Medicine

## 2021-02-20 DIAGNOSIS — G620 Drug-induced polyneuropathy: Secondary | ICD-10-CM

## 2021-02-20 DIAGNOSIS — T451X5A Adverse effect of antineoplastic and immunosuppressive drugs, initial encounter: Secondary | ICD-10-CM

## 2021-02-20 DIAGNOSIS — Z170 Estrogen receptor positive status [ER+]: Secondary | ICD-10-CM

## 2021-02-20 DIAGNOSIS — Z95828 Presence of other vascular implants and grafts: Secondary | ICD-10-CM

## 2021-02-20 DIAGNOSIS — C50211 Malignant neoplasm of upper-inner quadrant of right female breast: Secondary | ICD-10-CM

## 2021-02-28 ENCOUNTER — Encounter: Payer: Self-pay | Admitting: Nurse Practitioner

## 2021-02-28 ENCOUNTER — Ambulatory Visit: Payer: Medicare PPO | Admitting: Nurse Practitioner

## 2021-02-28 ENCOUNTER — Other Ambulatory Visit: Payer: Self-pay

## 2021-02-28 VITALS — BP 134/72 | HR 71 | Temp 98.0°F | Resp 18 | Ht 62.0 in | Wt 120.8 lb

## 2021-02-28 DIAGNOSIS — Z7689 Persons encountering health services in other specified circumstances: Secondary | ICD-10-CM | POA: Insufficient documentation

## 2021-02-28 DIAGNOSIS — M5432 Sciatica, left side: Secondary | ICD-10-CM | POA: Diagnosis not present

## 2021-02-28 NOTE — Progress Notes (Signed)
Acute Office Visit  Subjective:    Patient ID: Sandra [name], female    DOB: 04/04/1946, 75 y.o.   MRN: [id number]  Chief Complaint  Patient presents with   Transfer of Care    From [facility]   Hip Pain    X 2 to 3 weeks. Pain radiates from upper left thigh/buttocks area down her left leg. No injury. Has been taking Tylenol    HPI Patient is in today for/discomfort to her left buttock that radiates down the posterior portion of her left leg.  Patient denies new injury, or activity that she is aware that cause the symptoms to begin.  Patient states it started approximately 2 to 3 weeks ago.  States is intermittent in nature and the course is stable.  She has tried Tylenol and ibuprofen over-the-counter that does offer some mild relief of symptoms. Patient did mention that the discomfort/pain is not present when she is up walking or moving around.  She does notice it when she is resting or in a sitting position. Denies bowel/bladder issues, new numbness/tingling, or new weakness.   This visit occurred during the SARS-CoV-2 public health emergency.  Safety protocols were in place, including screening questions prior to the visit, additional usage of staff PPE, and extensive cleaning of exam room while observing appropriate contact time as indicated for disinfecting solutions.   Past Medical History:  Diagnosis Date   Breast cancer ([place]) 07/13/2015   RIGHT lumpectomy 07/13/15, pT1c, pN0,M0 Stage 1 Er/PR pos, her 2 neg. 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, due to chemotherapy   Personal history of chemotherapy 2016   Personal history of radiation therapy 2016   Visual blurriness    SOMETHING NEW THAT HAS DEVELOPED SINCE 07-13-15 SURGERY- SEEN AT [facility] AND EXAM WAS NORMAL PER PT (08-09-15)    Past Surgical History:  Procedure Laterality Date   ABDOMINAL HYSTERECTOMY  1987   BREAST BIOPSY Right     neg   BREAST BIOPSY Left    4 oclock, FIBROEPITHELIAL LESION FAVOR CELLULAR FIBROADENOMA   BREAST BIOPSY Left    2 oclock, CYSTIC APOCRINE METAPLASIA   BREAST BIOPSY Right 07/13/15   130 INVASIVE MAMMARY CARCINOMA, NO SPECIAL TYPE   BREAST EXCISIONAL BIOPSY Left 2016   fibroadenoma   BREAST LUMPECTOMY Left 07/13/2015   BREAST LUMPECTOMY WITH SENTINEL LYMPH NODE BIOPSY Right 07/13/2015   Procedure: BREAST LUMPECTOMY WITH SENTINEL LYMPH NODE BX;  Surgeon: Christene Lye, MD;  Location: [facility] ORS;  Service: General;  Laterality: Right;   BUNIONECTOMY Right 1993   CATARACT EXTRACTION W/PHACO Left 09/17/2016   Procedure: CATARACT EXTRACTION PHACO AND INTRAOCULAR LENS PLACEMENT ([place]);  Surgeon: Birder Robson, MD;  Location: [facility] ORS;  Service: Ophthalmology;  Laterality: Left;  [place] 00:47AP% 17.6CDE 8.39Fluid pack lot # [id number]   CATARACT EXTRACTION W/PHACO Right 01/21/2017   Procedure: CATARACT EXTRACTION PHACO AND INTRAOCULAR LENS PLACEMENT ([facility]);  Surgeon: Birder Robson, MD;  Location: [facility] ORS;  Service: Ophthalmology;  Laterality: Right;  [place] 00:39 AP% 13.0 CDE 5.16 fluid pack lot # [id number]   COLONOSCOPY  2015   fatty tumor Left 2013   side   PORT-A-CATH REMOVAL  2017   PORTACATH PLACEMENT Left 08/16/2015   Procedure: INSERTION PORT-A-CATH;  Surgeon: Christene Lye, MD;  Location: [facility] ORS;  Service: General;  Laterality: Left;    Family History  Problem Relation Age  of Onset   Cancer Sister 18       breast cancer   Breast cancer Sister 23   Colon cancer Neg Hx     Social History   Socioeconomic History   Marital status: Married    Spouse name: Not on file   Number of children: Not on file   Years of education: Not on file   Highest education level: Not on file  Occupational History   Not on file  Tobacco Use   Smoking status: Never   Smokeless tobacco: Never  Vaping Use   Vaping Use: Never used  Substance and Sexual Activity   Alcohol use: No     Alcohol/week: 0.0 standard drinks   Drug use: No   Sexual activity: Yes  Other Topics Concern   Not on file  Social History Narrative   End of life wishes (updated 01/2013)-   Daughter of wishes Systems developer)      Desires CPR.   Desires life support if reasonable.         Social Determinants of Health   Financial Resource Strain: Not on file  Food Insecurity: Not on file  Transportation Needs: Not on file  Physical Activity: Not on file  Stress: Not on file  Social Connections: Not on file  Intimate Partner Violence: Not on file    Outpatient Medications Prior to Visit  Medication Sig Dispense Refill   amLODipine (NORVASC) 5 MG tablet Take 1 tablet (5 mg total) by mouth daily. 90 tablet 0   anastrozole (ARIMIDEX) 1 MG tablet Take 1 tablet by mouth once daily 90 tablet 0   b complex vitamins tablet Take 1 tablet by mouth daily.     BIOTIN PO Take 5,000 mcg by mouth daily.     Calcium Carbonate-Vitamin D (CALCIUM 500 + D PO) Take 2 tablets by mouth daily.     COD LIVER OIL/VITAMINS A & D CAPS Take by mouth.     Flaxseed, Linseed, (FLAX SEEDS PO) Take by mouth daily.     gabapentin (NEURONTIN) 300 MG capsule Take 1 capsule (300 mg total) by mouth 3 (three) times daily. [id number] capsule 0   Garlic [id number] MG CAPS Take by mouth.     lisinopril (ZESTRIL) 10 MG tablet Take 1 tablet (10 mg total) by mouth daily. 90 tablet 0   PAPAYA ENZYMES PO Take by mouth daily.     Potassium 99 MG TABS Take 99 mg by mouth daily.      Turmeric 500 MG CAPS Take 1 capsule by mouth daily.     No facility-administered medications prior to visit.    Allergies  Allergen Reactions   Cymbalta [Duloxetine Hcl] Other (See Comments)    Glaucoma.  Unable to take   Lovastatin Anaphylaxis    Tongue swelling    Review of Systems  Constitutional:  Negative for chills and fever.  Respiratory:  Negative for shortness of breath.   Cardiovascular:  Negative for chest pain and leg swelling.   Gastrointestinal:  Negative for abdominal pain, diarrhea, nausea and vomiting.  Genitourinary:        Negative for bowel and bladder incontinence/retention  Musculoskeletal:  Positive for arthralgias.  Skin:  Negative for color change.  Neurological:  Negative for weakness.       Patient has neuropathy.  No new numbness-tingling      Objective:    Physical Exam Vitals and nursing note reviewed.  Constitutional:      Appearance: Normal appearance.  Cardiovascular:     Rate and Rhythm: Normal rate and regular rhythm.  Pulmonary:     Effort: Pulmonary effort is normal.     Breath sounds: Normal breath sounds.  Abdominal:     General: Bowel sounds are normal.  Musculoskeletal:     Left hand: Bony tenderness present.     Thoracic back: No tenderness.     Lumbar back: No tenderness. Negative right straight leg raise test and negative left straight leg raise test.       Back:     Left hip: Normal. No tenderness or bony tenderness. Normal range of motion.     Right lower leg: No swelling. No edema.     Left lower leg: No swelling. No edema.     Comments: No weakness on exam of strength of lower extremities. Cyst present to the left first finger PIP joint laterally.  No erythema or edema.  Mild tenderness on palpation.  Lymphadenopathy:     Cervical: No cervical adenopathy.  Neurological:     Mental Status: She is alert.    BP 134/72   Pulse 71   Temp 98 F (36.7 C)   Resp 18   Ht '5\' 2"'$  (1.575 m)   Wt 120 lb 12 oz (54.8 kg)   LMP  (LMP Unknown)   SpO2 99%   BMI 22.09 kg/m  Wt Readings from Last 3 Encounters:  02/28/21 120 lb 12 oz (54.8 kg)  01/30/21 122 lb (55.3 kg)  09/13/20 123 lb (55.8 kg)    Health Maintenance Due  Topic Date Due   Zoster Vaccines- Shingrix (1 of 2) Never done    There are no preventive care reminders to display for this patient.   Lab Results  Component Value Date   TSH 1.21 02/11/2020   Lab Results  Component Value Date   WBC 2.5  (L) 09/13/2020   HGB 12.7 09/13/2020   HCT 37.1 09/13/2020   MCV 90.3 09/13/2020   PLT 276 09/13/2020   Lab Results  Component Value Date   NA 137 09/13/2020   K 3.7 09/13/2020   CO2 27 09/13/2020   GLUCOSE 100 (H) 09/13/2020   BUN 12 09/13/2020   CREATININE 0.80 09/13/2020   BILITOT 0.9 09/13/2020   ALKPHOS 47 09/13/2020   AST 23 09/13/2020   ALT 16 09/13/2020   PROT 6.6 09/13/2020   ALBUMIN 3.9 09/13/2020   CALCIUM 9.0 09/13/2020   ANIONGAP 9 09/13/2020   GFR 77.78 05/08/2020   Lab Results  Component Value Date   CHOL 165 05/08/2020   Lab Results  Component Value Date   HDL 64.90 05/08/2020   Lab Results  Component Value Date   LDLCALC 91 05/08/2020   Lab Results  Component Value Date   TRIG 47.0 05/08/2020   Lab Results  Component Value Date   CHOLHDL 3 05/08/2020   Lab Results  Component Value Date   HGBA1C 5.1 07/26/2015       Assessment & Plan:   Problem List Items Addressed This Visit       Nervous and Auditory   Sciatica of left side    Occurred approximately 2 to 3 weeks ago.  Patient is already on gabapentin 300 mg 3 times daily.  Patient has neuropathy status post chemotherapy for breast cancer.  We discussed several treatment options inclusive of steroids and watchful waiting with exercise.  After discussion patient and I decided to try exercises first to see if this will alleviate  some of the discomfort she is experiencing.         Other   Establishing care with new doctor, encounter for - Primary    Patient transferring care from different provider in office.  Did review electronic medical record.  Reviewed vital signs and last few visits between specialist and urgent care.  She is taking medications as prescribed.  Blood pressure is well controlled.  She does have an oncologist Dr. Rogue Bussing.  States she sees him every 6 months. Recently saw orthopedist for what was charted as cysts to left first finger and PIP joint.  Patient did  mention his presents and sustained course.  She stated she made an appointment with dermatology to have it removed.  We will review orthopedist note to see recommendation and reach out to patient.       Follow-up in 1 to 2 months for wellness exam.  No orders of the defined types were placed in this encounter.    Romilda Garret, NP

## 2021-02-28 NOTE — Assessment & Plan Note (Signed)
Patient transferring care from different provider in office.  Did review electronic medical record.  Reviewed vital signs and last few visits between specialist and urgent care.  She is taking medications as prescribed.  Blood pressure is well controlled.  She does have an oncologist Dr. Rogue Bussing.  States she sees him every 6 months. Recently saw orthopedist for what was charted as cysts to left first finger and PIP joint.  Patient did mention his presents and sustained course.  She stated she made an appointment with dermatology to have it removed.  We will review orthopedist note to see recommendation and reach out to patient.

## 2021-02-28 NOTE — Patient Instructions (Signed)
I have attached some exercises to try. Do not over exert yourself We will schedule you for a wellness visit. Follow up in 1-2 months for the wellness visit. Sooner if symptoms worsen or fail to improve

## 2021-02-28 NOTE — Assessment & Plan Note (Signed)
Occurred approximately 2 to 3 weeks ago.  Patient is already on gabapentin 300 mg 3 times daily.  Patient has neuropathy status post chemotherapy for breast cancer.  We discussed several treatment options inclusive of steroids and watchful waiting with exercise.  After discussion patient and I decided to try exercises first to see if this will alleviate some of the discomfort she is experiencing.

## 2021-03-01 ENCOUNTER — Telehealth: Payer: Self-pay | Admitting: Nurse Practitioner

## 2021-03-02 ENCOUNTER — Encounter: Payer: Self-pay | Admitting: Nurse Practitioner

## 2021-03-02 NOTE — Telephone Encounter (Signed)
error 

## 2021-03-05 ENCOUNTER — Other Ambulatory Visit: Payer: Medicare PPO

## 2021-03-05 DIAGNOSIS — H903 Sensorineural hearing loss, bilateral: Secondary | ICD-10-CM | POA: Diagnosis not present

## 2021-03-05 DIAGNOSIS — H6123 Impacted cerumen, bilateral: Secondary | ICD-10-CM | POA: Diagnosis not present

## 2021-03-06 ENCOUNTER — Other Ambulatory Visit: Payer: Self-pay

## 2021-03-06 DIAGNOSIS — C50211 Malignant neoplasm of upper-inner quadrant of right female breast: Secondary | ICD-10-CM

## 2021-03-06 DIAGNOSIS — Z79811 Long term (current) use of aromatase inhibitors: Secondary | ICD-10-CM

## 2021-03-06 DIAGNOSIS — Z170 Estrogen receptor positive status [ER+]: Secondary | ICD-10-CM

## 2021-03-07 ENCOUNTER — Inpatient Hospital Stay: Payer: Medicare PPO | Attending: Internal Medicine

## 2021-03-07 ENCOUNTER — Other Ambulatory Visit: Payer: Self-pay

## 2021-03-07 ENCOUNTER — Inpatient Hospital Stay: Payer: Medicare PPO | Admitting: Internal Medicine

## 2021-03-07 ENCOUNTER — Encounter: Payer: Self-pay | Admitting: Internal Medicine

## 2021-03-07 DIAGNOSIS — G629 Polyneuropathy, unspecified: Secondary | ICD-10-CM | POA: Diagnosis not present

## 2021-03-07 DIAGNOSIS — Z95828 Presence of other vascular implants and grafts: Secondary | ICD-10-CM | POA: Diagnosis not present

## 2021-03-07 DIAGNOSIS — D708 Other neutropenia: Secondary | ICD-10-CM | POA: Diagnosis not present

## 2021-03-07 DIAGNOSIS — C50211 Malignant neoplasm of upper-inner quadrant of right female breast: Secondary | ICD-10-CM

## 2021-03-07 DIAGNOSIS — Z79811 Long term (current) use of aromatase inhibitors: Secondary | ICD-10-CM | POA: Diagnosis not present

## 2021-03-07 DIAGNOSIS — Z803 Family history of malignant neoplasm of breast: Secondary | ICD-10-CM | POA: Insufficient documentation

## 2021-03-07 DIAGNOSIS — Z170 Estrogen receptor positive status [ER+]: Secondary | ICD-10-CM

## 2021-03-07 DIAGNOSIS — D72819 Decreased white blood cell count, unspecified: Secondary | ICD-10-CM | POA: Insufficient documentation

## 2021-03-07 DIAGNOSIS — M8580 Other specified disorders of bone density and structure, unspecified site: Secondary | ICD-10-CM | POA: Insufficient documentation

## 2021-03-07 DIAGNOSIS — Z90710 Acquired absence of both cervix and uterus: Secondary | ICD-10-CM | POA: Diagnosis not present

## 2021-03-07 LAB — CBC WITH DIFFERENTIAL/PLATELET
Abs Immature Granulocytes: 0 10*3/uL (ref 0.00–0.07)
Basophils Absolute: 0 10*3/uL (ref 0.0–0.1)
Basophils Relative: 2 %
Eosinophils Absolute: 0.1 10*3/uL (ref 0.0–0.5)
Eosinophils Relative: 4 %
HCT: 39.2 % (ref 36.0–46.0)
Hemoglobin: 12.9 g/dL (ref 12.0–15.0)
Immature Granulocytes: 0 %
Lymphocytes Relative: 42 %
Lymphs Abs: 0.9 10*3/uL (ref 0.7–4.0)
MCH: 30 pg (ref 26.0–34.0)
MCHC: 32.9 g/dL (ref 30.0–36.0)
MCV: 91.2 fL (ref 80.0–100.0)
Monocytes Absolute: 0.2 10*3/uL (ref 0.1–1.0)
Monocytes Relative: 9 %
Neutro Abs: 1 10*3/uL — ABNORMAL LOW (ref 1.7–7.7)
Neutrophils Relative %: 43 %
Platelets: 298 10*3/uL (ref 150–400)
RBC: 4.3 MIL/uL (ref 3.87–5.11)
RDW: 12.2 % (ref 11.5–15.5)
WBC: 2.2 10*3/uL — ABNORMAL LOW (ref 4.0–10.5)
nRBC: 0 % (ref 0.0–0.2)

## 2021-03-07 LAB — COMPREHENSIVE METABOLIC PANEL
ALT: 20 U/L (ref 0–44)
AST: 27 U/L (ref 15–41)
Albumin: 4.2 g/dL (ref 3.5–5.0)
Alkaline Phosphatase: 40 U/L (ref 38–126)
Anion gap: 8 (ref 5–15)
BUN: 11 mg/dL (ref 8–23)
CO2: 27 mmol/L (ref 22–32)
Calcium: 9.2 mg/dL (ref 8.9–10.3)
Chloride: 101 mmol/L (ref 98–111)
Creatinine, Ser: 0.79 mg/dL (ref 0.44–1.00)
GFR, Estimated: >60 mL/min (ref >60)
Glucose, Bld: 99 mg/dL (ref 70–99)
Potassium: 3.8 mmol/L (ref 3.5–5.1)
Sodium: 136 mmol/L (ref 135–145)
Total Bilirubin: 0.8 mg/dL (ref 0.3–1.2)
Total Protein: 6.8 g/dL (ref 6.5–8.1)

## 2021-03-07 MED ORDER — ANASTROZOLE 1 MG PO TABS: 1.0000 mg | ORAL_TABLET | Freq: Every day | ORAL | 2 refills | Status: DC

## 2021-03-07 NOTE — Progress Notes (Signed)
[place] @ [facility] [phone]  Fax:([phone]) [place] OB: 13-Apr-1946  MR#: [id number]  YYT#:[id number]  Patient Care Team: Michela Pitcher, NP as PCP - General (Family Medicine) Glennie Isle, PA-C (Physician Assistant) Pyrtle, Lajuan Lines, MD as Consulting Physician (Gastroenterology) Lucille Passy, MD (Inactive) as Consulting Physician (Family Medicine) Christene Lye, MD (General Surgery) Cammie Sickle, MD as Consulting Physician (Internal Medicine) Leona Singleton, RN as Oncology Nurse Navigator Noreene Filbert, MD as Referring Physician (Radiation Oncology)  CHIEF COMPLAINT:  Chief Complaint  Patient presents with   Carcinoma of upper-inner quadrant of right breast in female   VISIT DIAGNOSIS:     ICD-10-CM   1. Carcinoma of upper-inner quadrant of right breast in female, estrogen receptor positive ([place])  C50.211 anastrozole (ARIMIDEX) 1 MG tablet   Z17.0 MM 3D SCREEN BREAST BILATERAL    2. Port-A-Cath in place  Z95.828 anastrozole (ARIMIDEX) 1 MG tablet       Oncology History Overview Note   carcinoma breast (right) inner and upper quadrant T1 cN0 M0 tumor Estrogen receptor positive progesterone receptor positive HER-2 receptor negative by fish Mammo print shows high risk (diagnosis in January of 2016) patient has 22% average risk in 5 years and 29% average risk in 10 years for distant metastectomy disease without chemotherapy on anti-hormonal treatment 2.  Patient started Cytoxan and Adriamycin on now August 30, 2015 MUGA scan shows ejection fraction of baseline 55%.  3.Patient has finished total of 4 cycles of chemotherapy on November 01, 2015 4.  Started on the Taxol from November 22, 2015; FINISHED RT [sep 2017] # OCT 2017- START ARIMIDEX   # NOV 2021 BMD- T score= - 1.9 -OSTEOPENIA- ca+vit D  # SURVIVORSHIP: O  --------------------------------------------------------   DIAGNOSIS: RIGHT BREAST  CA  STAGE: I        ;GOALS: cure  CURRENT/MOST RECENT THERAPY: on anastrazole      Carcinoma of upper-inner quadrant of right breast in female, estrogen receptor positive ([place])    INTERVAL HISTORY: 75 year old lady History of stage I right-sided breast cancer ER/PR positive currently on Arimidex is here for follow-up.  Patient denies any worsening bone pain to joint pain.  Chronic mild tingling and numbness.  She is currently on gabapentin.  She has occasional cramps in the legs at night.  Otherwise not consistent.  Review of Systems  Constitutional:  Negative for chills, diaphoresis, fever, malaise/fatigue and weight loss.  HENT:  Negative for nosebleeds and sore throat.   Eyes:  Negative for double vision.  Respiratory:  Negative for cough, hemoptysis, sputum production, shortness of breath and wheezing.   Cardiovascular:  Negative for chest pain, palpitations, orthopnea and leg swelling.  Gastrointestinal:  Negative for abdominal pain, blood in stool, constipation, diarrhea, heartburn, melena, nausea and vomiting.  Genitourinary:  Negative for dysuria, frequency and urgency.  Musculoskeletal:  Negative for back pain and joint pain.  Skin: Negative.  Negative for itching and rash.  Neurological:  Positive for tingling. Negative for dizziness, focal weakness, weakness and headaches.  Endo/Heme/Allergies:  Does not bruise/bleed easily.  Psychiatric/Behavioral:  Negative for depression. The patient is not nervous/anxious and does not have insomnia.     PAST MEDICAL HISTORY: Past Medical History:  Diagnosis Date   Breast cancer ([place]) 07/13/2015   RIGHT lumpectomy 07/13/15, pT1c, pN0,M0 Stage 1 Er/PR pos, her 2 neg. MammaPrint High   Hyperlipidemia    Hypertension    Neuropathy  Neuropathy 2018   toes and fingers, due to chemotherapy   Personal history of chemotherapy 2016   Personal history of radiation therapy 2016   Visual blurriness    SOMETHING NEW THAT HAS DEVELOPED  SINCE 07-13-15 SURGERY- SEEN AT [facility] ENT AND EXAM WAS NORMAL PER PT (08-09-15)    PAST SURGICAL HISTORY: Past Surgical History:  Procedure Laterality Date   ABDOMINAL HYSTERECTOMY  1987   BREAST BIOPSY Right    neg   BREAST BIOPSY Left    4 oclock, FIBROEPITHELIAL LESION FAVOR CELLULAR FIBROADENOMA   BREAST BIOPSY Left    2 oclock, CYSTIC APOCRINE METAPLASIA   BREAST BIOPSY Right 07/13/15   130 INVASIVE MAMMARY CARCINOMA, NO SPECIAL TYPE   BREAST EXCISIONAL BIOPSY Left 2016   fibroadenoma   BREAST LUMPECTOMY Left 07/13/2015   BREAST LUMPECTOMY WITH SENTINEL LYMPH NODE BIOPSY Right 07/13/2015   Procedure: BREAST LUMPECTOMY WITH SENTINEL LYMPH NODE BX;  Surgeon: Christene Lye, MD;  Location: [facility] ORS;  Service: General;  Laterality: Right;   BUNIONECTOMY Right 1993   CATARACT EXTRACTION W/PHACO Left 09/17/2016   Procedure: CATARACT EXTRACTION PHACO AND INTRAOCULAR LENS PLACEMENT ([place]);  Surgeon: Birder Robson, MD;  Location: [facility] ORS;  Service: Ophthalmology;  Laterality: Left;  [place] 00:47AP% 17.6CDE 8.39Fluid pack lot # [id number]   CATARACT EXTRACTION W/PHACO Right 01/21/2017   Procedure: CATARACT EXTRACTION PHACO AND INTRAOCULAR LENS PLACEMENT ([facility]);  Surgeon: Birder Robson, MD;  Location: [facility] ORS;  Service: Ophthalmology;  Laterality: Right;  [place] 00:39 AP% 13.0 CDE 5.16 fluid pack lot # [id number]   COLONOSCOPY  2015   fatty tumor Left 2013   side   PORT-A-CATH REMOVAL  2017   PORTACATH PLACEMENT Left 08/16/2015   Procedure: INSERTION PORT-A-CATH;  Surgeon: Christene Lye, MD;  Location: [facility] ORS;  Service: General;  Laterality: Left;    FAMILY HISTORY Family History  Problem Relation Age of Onset   Cancer Sister 46       breast cancer   Breast cancer Sister 62   Colon cancer Neg Hx         ADVANCED DIRECTIVES:   Patient does not have any living will or healthcare power of attorney.  Information was given .  Available resources had been discussed.  We  will follow-up on subsequent appointments regarding this issue HEALTH MAINTENANCE: Social History   Tobacco Use   Smoking status: Never   Smokeless tobacco: Never  Vaping Use   Vaping Use: Never used  Substance Use Topics   Alcohol use: No    Alcohol/week: 0.0 standard drinks   Drug use: No      :  Allergies  Allergen Reactions   Cymbalta [Duloxetine Hcl] Other (See Comments)    Glaucoma.  Unable to take   Lovastatin Anaphylaxis    Tongue swelling    Current Outpatient Medications  Medication Sig Dispense Refill   amLODipine (NORVASC) 5 MG tablet Take 1 tablet (5 mg total) by mouth daily. 90 tablet 0   b complex vitamins tablet Take 1 tablet by mouth daily.     BIOTIN PO Take 5,000 mcg by mouth daily.     Calcium Carbonate-Vitamin D (CALCIUM 500 + D PO) Take 2 tablets by mouth daily.     CALCIUM PO Take 1,000 mg by mouth.     COD LIVER OIL/VITAMINS A & D CAPS Take by mouth.     Flaxseed, Linseed, (FLAX SEEDS PO) Take by mouth daily.     gabapentin (NEURONTIN) 300  MG capsule Take 1 capsule (300 mg total) by mouth 3 (three) times daily. 482 capsule 0   Garlic 5003 MG CAPS Take by mouth.     lisinopril (ZESTRIL) 10 MG tablet Take 1 tablet (10 mg total) by mouth daily. 90 tablet 0   PAPAYA ENZYMES PO Take by mouth daily.     Potassium 99 MG TABS Take 99 mg by mouth daily.      Turmeric 500 MG CAPS Take 1 capsule by mouth daily.     anastrozole (ARIMIDEX) 1 MG tablet Take 1 tablet (1 mg total) by mouth daily. 90 tablet 2   No current facility-administered medications for this visit.    OBJECTIVE: Physical Exam HENT:     Head: Normocephalic and atraumatic.     Mouth/Throat:     Pharynx: No oropharyngeal exudate.  Eyes:     Pupils: Pupils are equal, round, and reactive to light.  Cardiovascular:     Rate and Rhythm: Normal rate and regular rhythm.  Pulmonary:     Effort: No respiratory distress.     Breath sounds: No wheezing.  Abdominal:     General: Bowel sounds  are normal. There is no distension.     Palpations: Abdomen is soft. There is no mass.     Tenderness: no abdominal tenderness There is no guarding or rebound.  Musculoskeletal:        General: No tenderness. Normal range of motion.     Cervical back: Normal range of motion and neck supple.  Skin:    General: Skin is warm.     Comments: Right and left BREAST exam [in the presence of nurse]- no unusual skin changes or dominant masses felt. Surgical scars noted.    Neurological:     Mental Status: She is alert and oriented to person, place, and time.  Psychiatric:        Mood and Affect: Affect normal.      Vitals:   03/07/21 1038 03/07/21 1040  BP: (!) 153/88 (!) 149/84  Pulse: 70 64  Resp: 16   Temp: (!) 96.5 F (35.8 C)   SpO2: 99%      Body mass index is 22.13 kg/m.    ECOG FS:0 - Asymptomatic  LAB RESULTS:  Appointment on 03/07/2021  Component Date Value Ref Range Status   WBC 03/07/2021 2.2 (A) 4.0 - 10.5 K/uL Final   RBC 03/07/2021 4.30  3.87 - 5.11 MIL/uL Final   Hemoglobin 03/07/2021 12.9  12.0 - 15.0 g/dL Final   HCT 03/07/2021 39.2  36.0 - 46.0 % Final   MCV 03/07/2021 91.2  80.0 - 100.0 fL Final   MCH 03/07/2021 30.0  26.0 - 34.0 pg Final   MCHC 03/07/2021 32.9  30.0 - 36.0 g/dL Final   RDW 03/07/2021 12.2  11.5 - 15.5 % Final   Platelets 03/07/2021 298  150 - 400 K/uL Final   nRBC 03/07/2021 0.0  0.0 - 0.2 % Final   Neutrophils Relative % 03/07/2021 43  % Final   Neutro Abs 03/07/2021 1.0 (A) 1.7 - 7.7 K/uL Final   Lymphocytes Relative 03/07/2021 42  % Final   Lymphs Abs 03/07/2021 0.9  0.7 - 4.0 K/uL Final   Monocytes Relative 03/07/2021 9  % Final   Monocytes Absolute 03/07/2021 0.2  0.1 - 1.0 K/uL Final   Eosinophils Relative 03/07/2021 4  % Final   Eosinophils Absolute 03/07/2021 0.1  0.0 - 0.5 K/uL Final   Basophils Relative 03/07/2021 2  %  Final   Basophils Absolute 03/07/2021 0.0  0.0 - 0.1 K/uL Final   Immature Granulocytes 03/07/2021 0  % Final    Abs Immature Granulocytes 03/07/2021 0.00  0.00 - 0.07 K/uL Final   Performed at [facility], [place], [place] [address]   Sodium 03/07/2021 136  135 - 145 mmol/L Final   Potassium 03/07/2021 3.8  3.5 - 5.1 mmol/L Final   Chloride 03/07/2021 101  98 - 111 mmol/L Final   CO2 03/07/2021 27  22 - 32 mmol/L Final   Glucose, Bld 03/07/2021 99  70 - 99 mg/dL Final   Glucose reference range applies only to samples taken after fasting for at least 8 hours.   BUN 03/07/2021 11  8 - 23 mg/dL Final   Creatinine, Ser 03/07/2021 0.79  0.44 - 1.00 mg/dL Final   Calcium 03/07/2021 9.2  8.9 - 10.3 mg/dL Final   Total Protein 03/07/2021 6.8  6.5 - 8.1 g/dL Final   Albumin 03/07/2021 4.2  3.5 - 5.0 g/dL Final   AST 03/07/2021 27  15 - 41 U/L Final   ALT 03/07/2021 20  0 - 44 U/L Final   Alkaline Phosphatase 03/07/2021 40  38 - 126 U/L Final   Total Bilirubin 03/07/2021 0.8  0.3 - 1.2 mg/dL Final   GFR, Estimated 03/07/2021 >60  >60 mL/min Final   Comment: (NOTE) Calculated using the CKD-EPI Creatinine Equation (2021)    Anion gap 03/07/2021 8  5 - 15 Final   Performed at [facility], [address] [place], [place] [address]     STUDIES: No results found.  ASSESSMENT:   Carcinoma of upper-inner quadrant of right breast in female, estrogen receptor positive ([place]) # Stage I ER/PR positive HER-2/neu negative;  On Anastrazole on extended AI.  Stable.  Tolerating very well; DEC 2021-Mammo-WNL.   # No clinical evidence of recurrence.  Continue anastrozole at this time.  Tolerating without any major side effects.   # Osteopenia- BMD- FEB 2022- T score= -.1.9 [2020: T score-1.9]; STABLE;  Continue calcium and vitamin D; reviewed the BMD-given overall stability of the bone density-I think it is reasonable to hold off any bisphosphonate therapy.  Continue exercise.  # Chronic benign ethnic neutropenia /mild leucopenia- 2.0/ ANC-1.0-- STABLE.   # Grade 2  neuropathy--STABLE;Neurontin 300 mg TID.   # DISPOSITION:  # dec 2022- mammogram # follow up in 6 months-MD/labs- cbc/cmp; -Dr.B.   No matching staging information was found for the patient.  Cammie Sickle, MD   03/07/2021 2:06 PM

## 2021-03-07 NOTE — Assessment & Plan Note (Addendum)
#  Stage I ER/PR positive HER-2/neu negative;  On Anastrazole on extended AI.  Stable.  Tolerating very well; DEC 2021-Mammo-WNL.   # No clinical evidence of recurrence.  Continue anastrozole at this time.  Tolerating without any major side effects.   # Osteopenia- BMD- FEB 2022- T score= -.1.9 [2020: T score-1.9]; STABLE;  Continue calcium and vitamin D; reviewed the BMD-given overall stability of the bone density-I think it is reasonable to hold off any bisphosphonate therapy.  Continue exercise.  # Chronic benign ethnic neutropenia /mild leucopenia- 2.0/ ANC-1.0-- STABLE.   # Grade 2 neuropathy--STABLE;Neurontin 300 mg TID.   # DISPOSITION:  # dec 2022- mammogram # follow up in 6 months-MD/labs- cbc/cmp; -Dr.B.

## 2021-03-07 NOTE — Progress Notes (Signed)
Has been having some cramps in legs mainly at night. Has noticed it within the last couple of days.

## 2021-03-14 ENCOUNTER — Encounter: Payer: Medicare PPO | Admitting: Adult Health

## 2021-04-23 ENCOUNTER — Encounter: Payer: Medicare PPO | Admitting: Adult Health

## 2021-04-24 ENCOUNTER — Other Ambulatory Visit: Payer: Medicare PPO

## 2021-04-24 ENCOUNTER — Ambulatory Visit: Payer: Medicare PPO

## 2021-04-25 DIAGNOSIS — Z888 Allergy status to other drugs, medicaments and biological substances status: Secondary | ICD-10-CM | POA: Diagnosis not present

## 2021-04-25 DIAGNOSIS — G629 Polyneuropathy, unspecified: Secondary | ICD-10-CM | POA: Diagnosis not present

## 2021-04-25 DIAGNOSIS — G8929 Other chronic pain: Secondary | ICD-10-CM | POA: Diagnosis not present

## 2021-04-25 DIAGNOSIS — Z8249 Family history of ischemic heart disease and other diseases of the circulatory system: Secondary | ICD-10-CM | POA: Diagnosis not present

## 2021-04-25 DIAGNOSIS — I10 Essential (primary) hypertension: Secondary | ICD-10-CM | POA: Diagnosis not present

## 2021-04-25 DIAGNOSIS — Z79811 Long term (current) use of aromatase inhibitors: Secondary | ICD-10-CM | POA: Diagnosis not present

## 2021-04-25 DIAGNOSIS — Z853 Personal history of malignant neoplasm of breast: Secondary | ICD-10-CM | POA: Diagnosis not present

## 2021-04-26 ENCOUNTER — Other Ambulatory Visit: Payer: Self-pay | Admitting: Family Medicine

## 2021-04-26 DIAGNOSIS — T451X5A Adverse effect of antineoplastic and immunosuppressive drugs, initial encounter: Secondary | ICD-10-CM

## 2021-04-26 NOTE — Telephone Encounter (Signed)
Last office visit 02/28/2021 for Establish Care.  Last refilled Gabapentin 01/30/2021 for #270 with no refills  Lisinopril 01/30/2021 for #90 with no refills by Dr. Diona Browner.  AWV scheduled for 05/09/2021.

## 2021-05-01 ENCOUNTER — Ambulatory Visit: Payer: Medicare PPO | Admitting: Nurse Practitioner

## 2021-05-01 ENCOUNTER — Encounter: Payer: Medicare PPO | Admitting: Nurse Practitioner

## 2021-05-04 ENCOUNTER — Ambulatory Visit: Payer: Medicare PPO

## 2021-05-09 ENCOUNTER — Encounter: Payer: Medicare PPO | Admitting: Nurse Practitioner

## 2021-05-10 ENCOUNTER — Encounter: Payer: Medicare PPO | Admitting: Internal Medicine

## 2021-05-11 ENCOUNTER — Ambulatory Visit (INDEPENDENT_AMBULATORY_CARE_PROVIDER_SITE_OTHER): Payer: Medicare PPO

## 2021-05-11 ENCOUNTER — Other Ambulatory Visit: Payer: Self-pay

## 2021-05-11 DIAGNOSIS — Z23 Encounter for immunization: Secondary | ICD-10-CM

## 2021-05-29 ENCOUNTER — Encounter: Payer: Self-pay | Admitting: Nurse Practitioner

## 2021-05-29 ENCOUNTER — Ambulatory Visit (INDEPENDENT_AMBULATORY_CARE_PROVIDER_SITE_OTHER): Payer: Medicare PPO | Admitting: Nurse Practitioner

## 2021-05-29 ENCOUNTER — Other Ambulatory Visit: Payer: Self-pay

## 2021-05-29 VITALS — BP 122/78 | HR 70 | Temp 98.2°F | Resp 14 | Ht 62.0 in | Wt 122.0 lb

## 2021-05-29 DIAGNOSIS — E7800 Pure hypercholesterolemia, unspecified: Secondary | ICD-10-CM

## 2021-05-29 DIAGNOSIS — Z0000 Encounter for general adult medical examination without abnormal findings: Secondary | ICD-10-CM

## 2021-05-29 DIAGNOSIS — I10 Essential (primary) hypertension: Secondary | ICD-10-CM

## 2021-05-29 LAB — COMPREHENSIVE METABOLIC PANEL
ALT: 15 U/L (ref 0–35)
AST: 23 U/L (ref 0–37)
Albumin: 4.2 g/dL (ref 3.5–5.2)
Alkaline Phosphatase: 41 U/L (ref 39–117)
BUN: 10 mg/dL (ref 6–23)
CO2: 30 mEq/L (ref 19–32)
Calcium: 9.3 mg/dL (ref 8.4–10.5)
Chloride: 104 mEq/L (ref 96–112)
Creatinine, Ser: 0.85 mg/dL (ref 0.40–1.20)
GFR: 66.59 mL/min (ref >60.00)
Glucose, Bld: 98 mg/dL (ref 70–99)
Potassium: 3.9 mEq/L (ref 3.5–5.1)
Sodium: 141 mEq/L (ref 135–145)
Total Bilirubin: 0.6 mg/dL (ref 0.2–1.2)
Total Protein: 6.9 g/dL (ref 6.0–8.3)

## 2021-05-29 LAB — LIPID PANEL
Cholesterol: 174 mg/dL (ref 0–200)
HDL: 84.9 mg/dL (ref >39.00)
LDL Cholesterol: 82 mg/dL (ref 0–99)
NonHDL: 89.36
Total CHOL/HDL Ratio: 2
Triglycerides: 36 mg/dL (ref 0.0–149.0)
VLDL: 7.2 mg/dL (ref 0.0–40.0)

## 2021-05-29 LAB — CBC
HCT: 38.7 % (ref 36.0–46.0)
Hemoglobin: 12.8 g/dL (ref 12.0–15.0)
MCHC: 33 g/dL (ref 30.0–36.0)
MCV: 91.2 fl (ref 78.0–100.0)
Platelets: 278 10*3/uL (ref 150.0–400.0)
RBC: 4.24 Mil/uL (ref 3.87–5.11)
RDW: 12.5 % (ref 11.5–15.5)
WBC: 2.3 10*3/uL — ABNORMAL LOW (ref 4.0–10.5)

## 2021-05-29 LAB — HEMOGLOBIN A1C: Hgb A1c MFr Bld: 4.8 % (ref 4.6–6.5)

## 2021-05-29 MED ORDER — AMLODIPINE BESYLATE 5 MG PO TABS: 5.0000 mg | ORAL_TABLET | Freq: Every day | ORAL | 1 refills | Status: DC

## 2021-05-29 NOTE — Assessment & Plan Note (Signed)
Continue amlodipine and lisinopril as prescribed.  Check c-Met and CBC today

## 2021-05-29 NOTE — Progress Notes (Signed)
Established Patient Office Visit  Subjective:  Patient ID: Sandra [name], female    DOB: [birth year]  Age: 75 y.o. MRN: [id number]  CC:  Chief Complaint  Patient presents with   Medicare Wellness    HPI Leler Brion presents for Medicare annual wellness  Reviewed paper work and scanned to chart. Reviewed personal, surgical and family history with patient.  for complete physical and follow up of chronic conditions.  Hearing Screening   _0  _1  _2  _3   Right ear _4 Left ear _5 Vision Screening - Comments:: Had eye exam in may 2022 at [facility]   Immunizations: -Tetanus:UTD -Influenza: UTD -Covid-19: UTD -Shingles: zostavax. Check with retail pharmacy -Pneumonia: UTD  -HPV: NA  Diet: Fair diet.  Exercise: Walks daily   Eye exam: Completes annually. [facility]  Dental exam: Completes semi-annually   Pap Smear: Completed in aged out and hysterectomy Mammogram: Completed in 07/2020. 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, due to chemotherapy   Personal history of chemotherapy 2016   Personal history of radiation therapy 2016   Visual blurriness    SOMETHING NEW THAT HAS DEVELOPED SINCE 07-13-15 SURGERY- SEEN AT [facility] AND EXAM WAS NORMAL PER PT (08-09-15)    Past Surgical History:  Procedure Laterality Date   ABDOMINAL HYSTERECTOMY  1987   BREAST BIOPSY Right    neg   BREAST BIOPSY Left    4 oclock, FIBROEPITHELIAL LESION FAVOR CELLULAR FIBROADENOMA   BREAST BIOPSY Left    2 oclock, CYSTIC APOCRINE METAPLASIA   BREAST BIOPSY Right 07/13/15   130  INVASIVE MAMMARY CARCINOMA, NO SPECIAL TYPE   BREAST EXCISIONAL BIOPSY Left 2016   fibroadenoma   BREAST LUMPECTOMY Left 07/13/2015   BREAST LUMPECTOMY WITH SENTINEL LYMPH NODE BIOPSY Right 07/13/2015   Procedure: BREAST LUMPECTOMY WITH SENTINEL LYMPH NODE BX;  Surgeon: Christene Lye, MD;  Location: [facility] ORS;  Service: General;  Laterality: Right;   BUNIONECTOMY Right 1993   CATARACT EXTRACTION W/PHACO Left 09/17/2016   Procedure: CATARACT EXTRACTION PHACO AND INTRAOCULAR LENS PLACEMENT ([place]);  Surgeon: Birder Robson, MD;  Location: [facility] ORS;  Service: Ophthalmology;  Laterality: Left;  [place] 00:47AP% 17.6CDE 8.39Fluid pack lot # [id number]   CATARACT EXTRACTION W/PHACO Right 01/21/2017   Procedure: CATARACT EXTRACTION PHACO AND INTRAOCULAR LENS PLACEMENT ([facility]);  Surgeon: Birder Robson, MD;  Location: [facility] ORS;  Service: Ophthalmology;  Laterality: Right;  [place] 00:39 AP% 13.0 CDE 5.16 fluid pack lot # [id number]   COLONOSCOPY  2015   fatty tumor Left 2013   side   PORT-A-CATH REMOVAL  2017   PORTACATH PLACEMENT Left 08/16/2015   Procedure: INSERTION PORT-A-CATH;  Surgeon: Christene Lye, MD;  Location: [facility] ORS;  Service: General;  Laterality: Left;    Family History  Problem Relation Age of Onset   Cancer Sister 75       breast cancer   Breast cancer Sister 101   Colon cancer Neg Hx     Social History   Socioeconomic History   Marital status: Married  Spouse name: Not on file   Number of children: Not on file   Years of education: Not on file   Highest education level: Not on file  Occupational History   Not on file  Tobacco Use   Smoking status: Never   Smokeless tobacco: Never  Vaping Use   Vaping Use: Never used  Substance and Sexual Activity   Alcohol use: No    Alcohol/week: 0.0 standard drinks   Drug use: No   Sexual activity: Yes  Other Topics Concern   Not on file  Social History Narrative   End of life wishes (updated 01/2013)-   Daughter of  wishes (Cherlyl Edgeley)      Desires CPR.   Desires life support if reasonable.         Social Determinants of Health   Financial Resource Strain: Not on file  Food Insecurity: Not on file  Transportation Needs: Not on file  Physical Activity: Not on file  Stress: Not on file  Social Connections: Not on file  Intimate Partner Violence: Not on file    Outpatient Medications Prior to Visit  Medication Sig Dispense Refill   amLODipine (NORVASC) 5 MG tablet Take 1 tablet (5 mg total) by mouth daily. 90 tablet 0   anastrozole (ARIMIDEX) 1 MG tablet Take 1 tablet (1 mg total) by mouth daily. 90 tablet 2   b complex vitamins tablet Take 1 tablet by mouth daily.     BIOTIN PO Take 5,000 mcg by mouth daily.     Calcium Carbonate-Vitamin D (CALCIUM 500 + D PO) Take 2 tablets by mouth daily.     CALCIUM PO Take 1,000 mg by mouth.     COD LIVER OIL/VITAMINS A & D CAPS Take by mouth.     Flaxseed, Linseed, (FLAX SEEDS PO) Take by mouth daily.     gabapentin (NEURONTIN) 300 MG capsule TAKE 1 CAPSULE BY MOUTH THREE TIMES DAILY 414 capsule 1   Garlic 2395 MG CAPS Take by mouth.     lisinopril (ZESTRIL) 10 MG tablet Take 1 tablet by mouth once daily 90 tablet 1   PAPAYA ENZYMES PO Take by mouth daily.     Potassium 99 MG TABS Take 99 mg by mouth daily.      Turmeric 500 MG CAPS Take 1 capsule by mouth daily.     No facility-administered medications prior to visit.    Allergies  Allergen Reactions   Cymbalta [Duloxetine Hcl] Other (See Comments)    Glaucoma.  Unable to take   Lovastatin Anaphylaxis    Tongue swelling    ROS Review of Systems  Constitutional:  Negative for chills and fever.  Respiratory:  Negative for cough and shortness of breath.   Cardiovascular:  Negative for chest pain and leg swelling.  Gastrointestinal:  Negative for abdominal pain, blood in stool, diarrhea, nausea and vomiting.  Genitourinary:  Negative for vaginal bleeding, vaginal discharge and vaginal  pain.  Neurological:  Positive for numbness (chemo induced neuropathy). Negative for weakness and headaches.     Objective:    Physical Exam Vitals and nursing note reviewed.  Constitutional:      Appearance: Normal appearance.  HENT:     Right Ear: Tympanic membrane, ear canal and external ear normal. There is no impacted cerumen.     Left Ear: Tympanic membrane, ear canal and external ear normal. There is no impacted cerumen.  Eyes:     Extraocular Movements: Extraocular movements intact.  Comments: Left pupil larger and does not constrict.  Neck:     Thyroid: No thyroid mass, thyromegaly or thyroid tenderness.  Cardiovascular:     Rate and Rhythm: Normal rate and regular rhythm.     Pulses: Normal pulses.  Lymphadenopathy:     Cervical: No cervical adenopathy.  Skin:    General: Skin is warm.  Neurological:     General: No focal deficit present.     Mental Status: She is alert.     Deep Tendon Reflexes:     Reflex Scores:      Bicep reflexes are 1+ on the right side and 1+ on the left side.      Patellar reflexes are 2+ on the right side and 2+ on the left side.    Comments: Bilateral upper and lower extremity strength 5/5  Psychiatric:        Mood and Affect: Mood normal.        Behavior: Behavior normal.        Thought Content: Thought content normal.        Judgment: Judgment normal.     PHQ9 SCORE ONLY 05/29/2021 05/08/2020 05/03/2019  PHQ-9 Total Score 0 0 0     BP 122/78   Pulse 70   Temp 98.2 F (36.8 C)   Resp 14   Ht _0  (1.575 m)   Wt 122 lb (55.3 kg)   LMP  (LMP Unknown)   SpO2 98%   BMI 22.31 kg/m  Wt Readings from Last 3 Encounters:  05/29/21 122 lb (55.3 kg)  03/07/21 121 lb (54.9 kg)  02/28/21 120 lb 12 oz (54.8 kg)     Health Maintenance Due  Topic Date Due   Zoster Vaccines- Shingrix (1 of 2) Never done   COVID-19 Vaccine (5 - Booster for Pfizer series) 02/12/2021    There are no preventive care reminders to display for this  patient.  Lab Results  Component Value Date   TSH 1.21 02/11/2020   Lab Results  Component Value Date   WBC 2.2 (L) 03/07/2021   HGB 12.9 03/07/2021   HCT 39.2 03/07/2021   MCV 91.2 03/07/2021   PLT 298 03/07/2021   Lab Results  Component Value Date   NA 136 03/07/2021   K 3.8 03/07/2021   CO2 27 03/07/2021   GLUCOSE 99 03/07/2021   BUN 11 03/07/2021   CREATININE 0.79 03/07/2021   BILITOT 0.8 03/07/2021   ALKPHOS 40 03/07/2021   AST 27 03/07/2021   ALT 20 03/07/2021   PROT 6.8 03/07/2021   ALBUMIN 4.2 03/07/2021   CALCIUM 9.2 03/07/2021   ANIONGAP 8 03/07/2021   GFR 77.78 05/08/2020   Lab Results  Component Value Date   CHOL 165 05/08/2020   Lab Results  Component Value Date   HDL 64.90 05/08/2020   Lab Results  Component Value Date   LDLCALC 91 05/08/2020   Lab Results  Component Value Date   TRIG 47.0 05/08/2020   Lab Results  Component Value Date   CHOLHDL 3 05/08/2020   Lab Results  Component Value Date   HGBA1C 5.1 07/26/2015      Assessment & Plan:   Problem List Items Addressed This Visit       Cardiovascular and Mediastinum   Hypertension    Continue amlodipine and lisinopril as prescribed.  Check c-Met and CBC today      Relevant Medications   amLODipine (NORVASC) 5 MG tablet     Other  Hyperlipidemia    Continue lifestyle modifications check lipid panel today.      Relevant Medications   amLODipine (NORVASC) 5 MG tablet   Medicare annual wellness visit, subsequent    Reviewed paperwork with patient signed and sent to be scanned.  Reviewed history.  Along with preventative exams and immunizations      Preventative health care - Primary    Reviewed preventative exams with patient and immunizations.  Pending lab results      Relevant Orders   CBC   Comprehensive metabolic panel   Lipid panel   Hemoglobin A1c    No orders of the defined types were placed in this encounter.   Follow-up: Return in about 6 months  (around 11/27/2021) for Recheck.   This visit occurred during the SARS-CoV-2 public health emergency.  Safety protocols were in place, including screening questions prior to the visit, additional usage of staff PPE, and extensive cleaning of exam room while observing appropriate contact time as indicated for disinfecting solutions.   Romilda Garret, NP

## 2021-05-29 NOTE — Patient Instructions (Signed)
Nice to see you today Will be in touch with the lab results Want to see you back in 6 months, sooner if needed

## 2021-05-29 NOTE — Assessment & Plan Note (Signed)
Reviewed preventative exams with patient and immunizations.  Pending lab results

## 2021-05-29 NOTE — Assessment & Plan Note (Signed)
Reviewed paperwork with patient signed and sent to be scanned.  Reviewed history.  Along with preventative exams and immunizations

## 2021-05-29 NOTE — Assessment & Plan Note (Signed)
Continue lifestyle modifications check lipid panel today.

## 2021-05-30 ENCOUNTER — Ambulatory Visit (INDEPENDENT_AMBULATORY_CARE_PROVIDER_SITE_OTHER): Payer: Medicare PPO

## 2021-05-30 ENCOUNTER — Ambulatory Visit: Payer: Medicare PPO | Admitting: Podiatry

## 2021-05-30 ENCOUNTER — Encounter: Payer: Self-pay | Admitting: Podiatry

## 2021-05-30 DIAGNOSIS — M7750 Other enthesopathy of unspecified foot: Secondary | ICD-10-CM | POA: Diagnosis not present

## 2021-05-30 DIAGNOSIS — M7741 Metatarsalgia, right foot: Secondary | ICD-10-CM

## 2021-05-30 DIAGNOSIS — M7751 Other enthesopathy of right foot: Secondary | ICD-10-CM | POA: Diagnosis not present

## 2021-05-30 DIAGNOSIS — M21612 Bunion of left foot: Secondary | ICD-10-CM | POA: Diagnosis not present

## 2021-05-30 DIAGNOSIS — M21619 Bunion of unspecified foot: Secondary | ICD-10-CM | POA: Diagnosis not present

## 2021-05-30 NOTE — Patient Instructions (Signed)
 EXERCISES- RANGE OF MOTION (ROM) AND STRETCHING EXERCISES - Plantar Fasciitis (Heel Spur Syndrome) These exercises may help you when beginning to rehabilitate your injury. Your symptoms may resolve with or without further involvement from your physician, physical therapist or athletic trainer. While completing these exercises, remember:  Restoring tissue flexibility helps normal motion to return to the joints. This allows healthier, less painful movement and activity. An effective stretch should be held for at least 30 seconds. A stretch should never be painful. You should only feel a gentle lengthening or release in the stretched tissue.  RANGE OF MOTION - Toe Extension, Flexion Sit with your right / left leg crossed over your opposite knee. Grasp your toes and gently pull them back toward the top of your foot. You should feel a stretch on the bottom of your toes and/or foot. Hold this stretch for 10 seconds. Now, gently pull your toes toward the bottom of your foot. You should feel a stretch on the top of your toes and or foot. Hold this stretch for 10 seconds. Repeat  times. Complete this stretch 3 times per day.   RANGE OF MOTION - Ankle Dorsiflexion, Active Assisted Remove shoes and sit on a chair that is preferably not on a carpeted surface. Place right / left foot under knee. Extend your opposite leg for support. Keeping your heel down, slide your right / left foot back toward the chair until you feel a stretch at your ankle or calf. If you do not feel a stretch, slide your bottom forward to the edge of the chair, while still keeping your heel down. Hold this stretch for 10 seconds. Repeat 3 times. Complete this stretch 2 times per day.   STRETCH  Gastroc, Standing Place hands on wall. Extend right / left leg, keeping the front knee somewhat bent. Slightly point your toes inward on your back foot. Keeping your right / left heel on the floor and your knee straight, shift your  weight toward the wall, not allowing your back to arch. You should feel a gentle stretch in the right / left calf. Hold this position for 10 seconds. Repeat 3 times. Complete this stretch 2 times per day.  STRETCH  Soleus, Standing Place hands on wall. Extend right / left leg, keeping the other knee somewhat bent. Slightly point your toes inward on your back foot. Keep your right / left heel on the floor, bend your back knee, and slightly shift your weight over the back leg so that you feel a gentle stretch deep in your back calf. Hold this position for 10 seconds. Repeat 3 times. Complete this stretch 2 times per day.  STRETCH  Gastrocsoleus, Standing  Note: This exercise can place a lot of stress on your foot and ankle. Please complete this exercise only if specifically instructed by your caregiver.  Place the ball of your right / left foot on a step, keeping your other foot firmly on the same step. Hold on to the wall or a rail for balance. Slowly lift your other foot, allowing your body weight to press your heel down over the edge of the step. You should feel a stretch in your right / left calf. Hold this position for 10 seconds. Repeat this exercise with a slight bend in your right / left knee. Repeat 3 times. Complete this stretch 2 times per day.   STRENGTHENING EXERCISES - Plantar Fasciitis (Heel Spur Syndrome)  These exercises may help you when beginning to rehabilitate your   injury. They may resolve your symptoms with or without further involvement from your physician, physical therapist or athletic trainer. While completing these exercises, remember:  Muscles can gain both the endurance and the strength needed for everyday activities through controlled exercises. Complete these exercises as instructed by your physician, physical therapist or athletic trainer. Progress the resistance and repetitions only as guided.  STRENGTH - Towel Curls Sit in a chair positioned on a  non-carpeted surface. Place your foot on a towel, keeping your heel on the floor. Pull the towel toward your heel by only curling your toes. Keep your heel on the floor. Repeat 3 times. Complete this exercise 2 times per day.  STRENGTH - Ankle Inversion Secure one end of a rubber exercise band/tubing to a fixed object (table, pole). Loop the other end around your foot just before your toes. Place your fists between your knees. This will focus your strengthening at your ankle. Slowly, pull your big toe up and in, making sure the band/tubing is positioned to resist the entire motion. Hold this position for 10 seconds. Have your muscles resist the band/tubing as it slowly pulls your foot back to the starting position. Repeat 3 times. Complete this exercises 2 times per day.  Document Released: 07/22/2005 Document Revised: 10/14/2011 Document Reviewed: 11/03/2008 [employer] Patient Information 2014 [employer].  

## 2021-05-31 NOTE — Progress Notes (Signed)
  Subjective:  Patient ID: Sandra [name], female    DOB: 02/10/1946,  MRN: [id number]  Chief Complaint  Patient presents with   Foot Pain     right foot pain, sharp pain in the arch when sitting   Bunions    left foot bunion, not painful but wants to know what she can do to keep it from getting worse     75 y.o. female presents with the above complaint. History confirmed with patient.  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  She complains of a sharp pain especially at night in the bottom of the foot that radiates up her foot.  She also is a bunion on the left side it is not currently painful  Objective:  Physical Exam: warm, good capillary refill, no trophic changes or ulcerative lesions, normal DP and PT pulses, and normal sensory exam. Left Foot: Hallux valgus deformity with a bunion Right Foot: Unable to reproduce the sharp pain she has but she has thinning of the fat pad plantarly    Radiographs: Multiple views x-ray of both feet: Prior bunion surgery right foot, she has no osseous abnormalities in the area of pain on the right foot.  The left foot has a moderate hallux valgus deformity Assessment:   1. Tendonitis of ankle or foot   2. Bunion      Plan:  Patient was evaluated and treated and all questions answered.  Discussed both surgical and nonsurgical treatment of bunions in detail with her.  It is currently not painful.  She will continue to monitor.  For her became painful and she is interested in surgical reduction I think she likely would benefit from a distal metatarsal osteotomy and Akin osteotomy.  Regarding the pain on the right foot very thick this likely is metatarsalgia.  Possible small neuroma.  She does have neuropathy and this could be contributing as well.  Advised to wear cushioned shoes and do not go barefoot in the house.  We will continue to monitor and revisit if it becomes more  bothersome  Return if symptoms worsen or fail to improve.

## 2021-06-06 ENCOUNTER — Telehealth: Payer: Self-pay

## 2021-06-06 NOTE — Telephone Encounter (Signed)
Per Matt's request, requested last office note from [facility] office on the patient due to abnormalities noted with patient's left pupil/eye on recent exam here. Received notes but could not read them well. 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 This diagnoses was from 2017. Olivia Mackie did mention that in 2021 patient had a more in depth work up and it was noted that left eye had irregularity but did not mention what kind.

## 2021-07-19 ENCOUNTER — Encounter: Payer: Self-pay | Admitting: Nurse Practitioner

## 2021-07-20 ENCOUNTER — Other Ambulatory Visit: Payer: Self-pay

## 2021-07-20 ENCOUNTER — Ambulatory Visit
Admission: RE | Admit: 2021-07-20 | Discharge: 2021-07-20 | Disposition: A | Discharge status: Home / Self Care | Payer: Medicare PPO | Source: Ambulatory Visit | Attending: Internal Medicine | Admitting: Internal Medicine

## 2021-07-20 DIAGNOSIS — C50211 Malignant neoplasm of upper-inner quadrant of right female breast: Secondary | ICD-10-CM | POA: Diagnosis not present

## 2021-07-20 DIAGNOSIS — Z170 Estrogen receptor positive status [ER+]: Secondary | ICD-10-CM | POA: Diagnosis not present

## 2021-07-20 DIAGNOSIS — Z1231 Encounter for screening mammogram for malignant neoplasm of breast: Secondary | ICD-10-CM | POA: Diagnosis not present

## 2021-08-19 ENCOUNTER — Other Ambulatory Visit: Payer: Self-pay | Admitting: Nurse Practitioner

## 2021-08-19 DIAGNOSIS — T451X5A Adverse effect of antineoplastic and immunosuppressive drugs, initial encounter: Secondary | ICD-10-CM

## 2021-08-19 DIAGNOSIS — G620 Drug-induced polyneuropathy: Secondary | ICD-10-CM

## 2021-08-20 ENCOUNTER — Encounter: Payer: Self-pay | Admitting: Dermatology

## 2021-08-20 ENCOUNTER — Other Ambulatory Visit: Payer: Self-pay

## 2021-08-20 ENCOUNTER — Ambulatory Visit: Payer: Medicare PPO | Admitting: Dermatology

## 2021-08-20 DIAGNOSIS — L811 Chloasma: Secondary | ICD-10-CM | POA: Diagnosis not present

## 2021-08-20 DIAGNOSIS — R2242 Localized swelling, mass and lump, left lower limb: Secondary | ICD-10-CM | POA: Diagnosis not present

## 2021-08-20 DIAGNOSIS — R229 Localized swelling, mass and lump, unspecified: Secondary | ICD-10-CM

## 2021-08-20 NOTE — Patient Instructions (Addendum)
Instructions for [facility] Medications  One or more of your medications was sent to the [facility] mail order compounding pharmacy. You will receive an email from them and can purchase the medicine through that link. It will then be mailed to your home at the address you confirmed. If for any reason you do not receive an email from them, please check your spam folder. If you still do not find the email, please let us know. [facility] phone number is [phone].   Recommend hand surgeon at [facility]. Will send referral.   If You Need Anything After Your Visit  If you have any questions or concerns for your doctor, please call our main line at [phone] and press option 4 to reach your doctor's medical assistant. If no one answers, please leave a voicemail as directed and we will return your call as soon as possible. Messages left after 4 pm will be answered the following business day.   You may also send [place] a message via [place]. We typically respond to MyChart messages within 1-2 business days.  For prescription refills, please ask your pharmacy to contact our office. Our fax number is [phone].  If you have an urgent issue when the clinic is closed that cannot wait until the next business day, you can page your doctor at the number below.    Please note that while we do our best to be available for urgent issues outside of office hours, we are not available 24/7.   If you have an urgent issue and are unable to reach [place], you may choose to seek medical care at your doctor's office, retail clinic, urgent care center, or emergency room.  If you have a medical emergency, please immediately call 911 or go to the emergency department.  Pager Numbers  - Dr. Nehemiah Massed: [phone]  - Dr. Laurence Ferrari: [phone]  - Dr. Nicole Kindred: [phone]  In the event of inclement weather, please call our main line at [phone] for an update on the status of any delays or  closures.  Dermatology Medication Tips: Please keep the boxes that topical medications come in in order to help keep track of the instructions about where and how to use these. Pharmacies typically print the medication instructions only on the boxes and not directly on the medication tubes.   If your medication is too expensive, please contact our office at [phone] option 4 or send [place] a message through [place].   We are unable to tell what your co-pay for medications will be in advance as this is different depending on your insurance coverage. However, we may be able to find a substitute medication at lower cost or fill out paperwork to get insurance to cover a needed medication.   If a prior authorization is required to get your medication covered by your insurance company, please allow [place] 1-2 business days to complete this process.  Drug prices often vary depending on where the prescription is filled and some pharmacies may offer cheaper prices.  The website www.goodrx.com contains coupons for medications through different pharmacies. The prices here do not account for what the cost may be with help from insurance (it may be cheaper with your insurance), but the website can give you the price if you did not use any insurance.  - You can print the associated coupon and take it with your prescription to the pharmacy.  - You may also stop by our office during regular business hours and pick up a  GoodRx coupon card.  - If you need your prescription sent electronically to a different pharmacy, notify our office through [facility] or by phone at [phone] option 4.     Si Usted Necesita Algo Despus de Su Visita  Tambin puede enviarnos un mensaje a travs de Pharmacist, community. Por lo general respondemos a los mensajes de MyChart en el transcurso de 1 a 2 das hbiles.  Para renovar recetas, por favor pida a su farmacia que se ponga en contacto con nuestra oficina. Harland Dingwall de fax  es [place] [phone].  Si tiene un asunto urgente cuando la clnica est cerrada y que no puede esperar hasta el siguiente da hbil, puede llamar/localizar a su doctor(a) al nmero que aparece a continuacin.   Por favor, tenga en cuenta que aunque hacemos todo lo posible para estar disponibles para asuntos urgentes fuera del horario de [place], no estamos disponibles las 24 horas del da, los 7 das de la [place].   Si tiene un problema urgente y no puede comunicarse con nosotros, puede optar por buscar atencin mdica  en el consultorio de su doctor(a), en una clnica privada, en un centro de atencin urgente o en una sala de emergencias.  Si tiene Engineering geologist, por favor llame inmediatamente al 911 o vaya a la sala de emergencias.  Nmeros de bper  - Dr. Nehemiah Massed: [phone]  - Dra. Moye: [phone]  - Dra. Nicole Kindred: [phone]  En caso de inclemencias del [place], por favor llame a Johnsie Kindred principal al [phone] para una actualizacin sobre el [place] de cualquier retraso o cierre.  Consejos para la medicacin en dermatologa: Por favor, guarde las cajas en las que vienen los medicamentos de uso tpico para ayudarle a seguir las instrucciones sobre dnde y cmo usarlos. Las farmacias generalmente imprimen las instrucciones del medicamento slo en las cajas y no directamente en los tubos del [place].   Si su medicamento es muy caro, por favor, pngase en contacto con Zigmund Daniel llamando al [phone] y presione la opcin 4 o envenos un mensaje a travs de Pharmacist, community.   No podemos decirle cul ser su copago por los medicamentos por adelantado ya que esto es diferente dependiendo de la cobertura de su seguro. Sin embargo, es posible que podamos encontrar un medicamento sustituto a Electrical engineer un formulario para que el seguro cubra el medicamento que se considera necesario.   Si se requiere una autorizacin previa para que su compaa de seguros [place]  su medicamento, por favor permtanos de 1 a 2 das hbiles para completar este proceso.  Los precios de los medicamentos varan con frecuencia dependiendo del Environmental consultant de dnde se surte la receta y alguna farmacias pueden ofrecer precios ms baratos.  El sitio web www.goodrx.com tiene cupones para medicamentos de Airline pilot. Los precios aqu no tienen en cuenta lo que podra costar con la ayuda del seguro (puede ser ms barato con su seguro), pero el sitio web puede darle el precio si no utiliz Research scientist (physical sciences).  - Puede imprimir el cupn correspondiente y llevarlo con su receta a la farmacia.  - Tambin puede pasar por nuestra oficina durante el horario de atencin regular y Charity fundraiser una tarjeta de cupones de GoodRx.  - Si necesita que su receta se enve electrnicamente a una farmacia diferente, informe a nuestra oficina a travs de MyChart de [place] o por telfono llamando al [phone] y presione la opcin 4.

## 2021-08-20 NOTE — Progress Notes (Signed)
° °  New Patient Visit  Subjective  Sandra [name] is a 76 y.o. female who presents for the following: Cyst (Patient states she has a cyst on left index finger. Tender at times. Dur: several months. Had X-ray at urgent care, was sent to orthopedic hand specialist. He stated it would be too hard to treat. Patient is here for another opinion. ) and Skin Problem (Check dark spots on face. Has been seen here in the past and was given topical medication. She cannot remember the name. ).  Review of Systems: No other skin or systemic complaints except as noted in HPI or Assessment and Plan.  Objective  Well appearing patient in no apparent distress; mood and affect are within normal limits.  A focused examination was performed including face, hands, fingers. Relevant physical exam findings are noted in the Assessment and Plan.  B/L cheeks Reticulated hyperpigmented patches.          Left 2nd Finger Proximal Interphalangeal Joint Mild edema        Assessment & Plan  Melasma B/L cheeks See photos Melasma is a chronic condition of persistent pigmented patches generally on the face, worse in summer due to higher UV exposure.  Oral estrogen containing BCPs or supplements can exacerbate condition.  Recommend daily broad spectrum tinted sunscreen SPF 30+ to face, preferably with Zinc or Titanium Dioxide. Discussed Rx topical bleaching creams (i.e. hydroquinone), OTC HelioCare supplement, chemical peels (would need multiple for best result).   Start:  Tretinoin 0.025%, HQ 8%, Niacinamide 4%, Kojic Acid 1%, Fluocinolone 0.025% cream QHS to dark areas up to 3 months. Continue: Sunscreen daily May add in future: Heliocare  Soft tissue swelling of finger clinically and per Xray Left 2nd Finger Proximal Interphalangeal Joint Recommend hand surgeon at [facility]. Will send referral.   Reviewed radiology report. No cyst noted on report.   Return in about 3 months (around  11/18/2021) for Melasma recheck.  I, Emelia Salisbury, CMA, am acting as scribe for Sarina Ser, MD. Documentation: I have reviewed the above documentation for accuracy and completeness, and I agree with the above.  Sarina Ser, MD

## 2021-08-21 ENCOUNTER — Encounter: Payer: Self-pay | Admitting: Dermatology

## 2021-08-23 ENCOUNTER — Other Ambulatory Visit: Payer: Self-pay

## 2021-08-23 DIAGNOSIS — R229 Localized swelling, mass and lump, unspecified: Secondary | ICD-10-CM

## 2021-08-28 DIAGNOSIS — R2232 Localized swelling, mass and lump, left upper limb: Secondary | ICD-10-CM | POA: Diagnosis not present

## 2021-08-29 ENCOUNTER — Other Ambulatory Visit: Payer: Self-pay | Admitting: *Deleted

## 2021-08-29 DIAGNOSIS — Z170 Estrogen receptor positive status [ER+]: Secondary | ICD-10-CM

## 2021-08-30 ENCOUNTER — Telehealth: Payer: Self-pay

## 2021-08-30 NOTE — Telephone Encounter (Signed)
[facility] progress notes in patients Chart Review under Media. aw

## 2021-09-03 DIAGNOSIS — H6123 Impacted cerumen, bilateral: Secondary | ICD-10-CM | POA: Diagnosis not present

## 2021-09-03 DIAGNOSIS — H9311 Tinnitus, right ear: Secondary | ICD-10-CM | POA: Diagnosis not present

## 2021-09-05 ENCOUNTER — Inpatient Hospital Stay: Payer: Medicare PPO | Attending: Internal Medicine

## 2021-09-05 ENCOUNTER — Encounter: Payer: Self-pay | Admitting: Internal Medicine

## 2021-09-05 ENCOUNTER — Other Ambulatory Visit: Payer: Self-pay

## 2021-09-05 ENCOUNTER — Inpatient Hospital Stay: Payer: Medicare PPO | Admitting: Internal Medicine

## 2021-09-05 DIAGNOSIS — Z79899 Other long term (current) drug therapy: Secondary | ICD-10-CM | POA: Insufficient documentation

## 2021-09-05 DIAGNOSIS — Z170 Estrogen receptor positive status [ER+]: Secondary | ICD-10-CM | POA: Insufficient documentation

## 2021-09-05 DIAGNOSIS — C50211 Malignant neoplasm of upper-inner quadrant of right female breast: Secondary | ICD-10-CM

## 2021-09-05 DIAGNOSIS — Z79811 Long term (current) use of aromatase inhibitors: Secondary | ICD-10-CM | POA: Insufficient documentation

## 2021-09-05 DIAGNOSIS — M8580 Other specified disorders of bone density and structure, unspecified site: Secondary | ICD-10-CM | POA: Insufficient documentation

## 2021-09-05 LAB — CBC WITH DIFFERENTIAL/PLATELET
Abs Immature Granulocytes: 0 10*3/uL (ref 0.00–0.07)
Basophils Absolute: 0 10*3/uL (ref 0.0–0.1)
Basophils Relative: 2 %
Eosinophils Absolute: 0.1 10*3/uL (ref 0.0–0.5)
Eosinophils Relative: 4 %
HCT: 39.1 % (ref 36.0–46.0)
Hemoglobin: 12.9 g/dL (ref 12.0–15.0)
Immature Granulocytes: 0 %
Lymphocytes Relative: 37 %
Lymphs Abs: 0.9 10*3/uL (ref 0.7–4.0)
MCH: 30 pg (ref 26.0–34.0)
MCHC: 33 g/dL (ref 30.0–36.0)
MCV: 90.9 fL (ref 80.0–100.0)
Monocytes Absolute: 0.2 10*3/uL (ref 0.1–1.0)
Monocytes Relative: 9 %
Neutro Abs: 1.1 10*3/uL — ABNORMAL LOW (ref 1.7–7.7)
Neutrophils Relative %: 48 %
Platelets: 273 10*3/uL (ref 150–400)
RBC: 4.3 MIL/uL (ref 3.87–5.11)
RDW: 12.4 % (ref 11.5–15.5)
WBC: 2.3 10*3/uL — ABNORMAL LOW (ref 4.0–10.5)
nRBC: 0 % (ref 0.0–0.2)

## 2021-09-05 LAB — COMPREHENSIVE METABOLIC PANEL
ALT: 17 U/L (ref 0–44)
AST: 24 U/L (ref 15–41)
Albumin: 4 g/dL (ref 3.5–5.0)
Alkaline Phosphatase: 41 U/L (ref 38–126)
Anion gap: 8 (ref 5–15)
BUN: 14 mg/dL (ref 8–23)
CO2: 30 mmol/L (ref 22–32)
Calcium: 9.2 mg/dL (ref 8.9–10.3)
Chloride: 101 mmol/L (ref 98–111)
Creatinine, Ser: 0.83 mg/dL (ref 0.44–1.00)
GFR, Estimated: >60 mL/min (ref >60)
Glucose, Bld: 104 mg/dL — ABNORMAL HIGH (ref 70–99)
Potassium: 3.6 mmol/L (ref 3.5–5.1)
Sodium: 139 mmol/L (ref 135–145)
Total Bilirubin: 0.5 mg/dL (ref 0.3–1.2)
Total Protein: 6.6 g/dL (ref 6.5–8.1)

## 2021-09-05 NOTE — Progress Notes (Signed)
[place] @ [facility] [phone]  [phone]   INITIAL CONSULT  Sandra [name] OB: Dec 08, 1945  MR#: [id number]  BVQ#:[id number]  Patient Care Team: Michela Pitcher, NP as PCP - General (Family Medicine) Glennie Isle, PA-C (Physician Assistant) Pyrtle, Lajuan Lines, MD as Consulting Physician (Gastroenterology) Lucille Passy, MD (Inactive) as Consulting Physician (Family Medicine) Christene Lye, MD (General Surgery) Cammie Sickle, MD as Consulting Physician (Internal Medicine) Leona Singleton, RN as Oncology Nurse Navigator Noreene Filbert, MD as Referring Physician (Radiation Oncology)  CHIEF COMPLAINT:  Chief Complaint  Patient presents with   Carcinoma of upper-inner quadrant of right breast in female   VISIT DIAGNOSIS:     ICD-10-CM   1. Carcinoma of upper-inner quadrant of right breast in female, estrogen receptor positive (Sandra [name])  C50.211 CBC with Differential   Z17.0 Comprehensive metabolic panel       Oncology History Overview Note   carcinoma breast (right) inner and upper quadrant T1 cN0 M0 tumor Estrogen receptor positive progesterone receptor positive HER-2 receptor negative by fish Mammo print shows high risk (diagnosis in January of 2016) patient has 22% average risk in 5 years and 29% average risk in 10 years for distant metastectomy disease without chemotherapy on anti-hormonal treatment 2.  Patient started Cytoxan and Adriamycin on now August 30, 2015 MUGA scan shows ejection fraction of baseline 55%.  3.Patient has finished total of 4 cycles of chemotherapy on November 01, 2015 4.  Started on the Taxol from November 22, 2015; FINISHED RT [sep 2017] # OCT 2017- START ARIMIDEX   # NOV 2021 BMD- T score= - 1.9 -OSTEOPENIA- ca+vit D  # SURVIVORSHIP: O  --------------------------------------------------------   DIAGNOSIS: RIGHT BREAST CA  STAGE: I        ;GOALS: cure  CURRENT/MOST RECENT THERAPY: on  anastrazole      Carcinoma of upper-inner quadrant of right breast in female, estrogen receptor positive (Sandra [name])    INTERVAL HISTORY: Alone.  Ambulating independently.  76 year old lady History of stage I right-sided breast cancer ER/PR positive currently on Arimidex is here for follow-up.  Patient denies any worsening joint pains or bone pain.  Denies any worsening tingling or numbness.  She continues with gabapentin.   Review of Systems  Constitutional:  Negative for chills, diaphoresis, fever, malaise/fatigue and weight loss.  HENT:  Negative for nosebleeds and sore throat.   Eyes:  Negative for double vision.  Respiratory:  Negative for cough, hemoptysis, sputum production, shortness of breath and wheezing.   Cardiovascular:  Negative for chest pain, palpitations, orthopnea and leg swelling.  Gastrointestinal:  Negative for abdominal pain, blood in stool, constipation, diarrhea, heartburn, melena, nausea and vomiting.  Genitourinary:  Negative for dysuria, frequency and urgency.  Musculoskeletal:  Negative for back pain and joint pain.  Skin: Negative.  Negative for itching and rash.  Neurological:  Positive for tingling. Negative for dizziness, focal weakness, weakness and headaches.  Endo/Heme/Allergies:  Does not bruise/bleed easily.  Psychiatric/Behavioral:  Negative for depression. The patient is not nervous/anxious and does not have insomnia.     PAST MEDICAL HISTORY: Past Medical History:  Diagnosis Date   Breast cancer ([place]) 07/13/2015   RIGHT lumpectomy 07/13/15, pT1c, pN0,M0 Stage 1 Er/PR pos, her 2 neg. 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, due to chemotherapy   Personal history of chemotherapy 2016   Personal history of radiation therapy  2016   Visual blurriness    SOMETHING NEW THAT HAS DEVELOPED SINCE 07-13-15 SURGERY- SEEN AT [facility] AND EXAM WAS NORMAL PER PT (08-09-15)    PAST  SURGICAL HISTORY: Past Surgical History:  Procedure Laterality Date   ABDOMINAL HYSTERECTOMY  1987   BREAST BIOPSY Right    neg   BREAST BIOPSY Left    4 oclock, FIBROEPITHELIAL LESION FAVOR CELLULAR FIBROADENOMA   BREAST BIOPSY Left    2 oclock, CYSTIC APOCRINE METAPLASIA   BREAST BIOPSY Right 07/13/15   130 INVASIVE MAMMARY CARCINOMA, NO SPECIAL TYPE   BREAST EXCISIONAL BIOPSY Left 2016   fibroadenoma   BREAST LUMPECTOMY Left 07/13/2015   BREAST LUMPECTOMY WITH SENTINEL LYMPH NODE BIOPSY Right 07/13/2015   Procedure: BREAST LUMPECTOMY WITH SENTINEL LYMPH NODE BX;  Surgeon: Christene Lye, MD;  Location: [facility] ORS;  Service: General;  Laterality: Right;   BUNIONECTOMY Right 1993   CATARACT EXTRACTION W/PHACO Left 09/17/2016   Procedure: CATARACT EXTRACTION PHACO AND INTRAOCULAR LENS PLACEMENT ([place]);  Surgeon: Birder Robson, MD;  Location: [facility] ORS;  Service: Ophthalmology;  Laterality: Left;  [place] 00:47AP% 17.6CDE 8.39Fluid pack lot # [id number]   CATARACT EXTRACTION W/PHACO Right 01/21/2017   Procedure: CATARACT EXTRACTION PHACO AND INTRAOCULAR LENS PLACEMENT ([facility]);  Surgeon: Birder Robson, MD;  Location: [facility] ORS;  Service: Ophthalmology;  Laterality: Right;  [place] 00:39 AP% 13.0 CDE 5.16 fluid pack lot # [id number]   COLONOSCOPY  2015   fatty tumor Left 2013   side   PORT-A-CATH REMOVAL  2017   PORTACATH PLACEMENT Left 08/16/2015   Procedure: INSERTION PORT-A-CATH;  Surgeon: Christene Lye, MD;  Location: [facility] ORS;  Service: General;  Laterality: Left;    FAMILY HISTORY Family History  Problem Relation Age of Onset   Cancer Sister 24       breast cancer   Breast cancer Sister 29   Colon cancer Neg Hx         ADVANCED DIRECTIVES:   Patient does not have any living will or healthcare power of attorney.  Information was given .  Available resources had been discussed.   HEALTH MAINTENANCE: Social History   Tobacco Use   Smoking status: Never    Smokeless tobacco: Never  Vaping Use   Vaping Use: Never used  Substance Use Topics   Alcohol use: No    Alcohol/week: 0.0 standard drinks   Drug use: No      :  Allergies  Allergen Reactions   Cymbalta [Duloxetine Hcl] Other (See Comments)    Glaucoma.  Unable to take   Lovastatin Anaphylaxis    Tongue swelling    Current Outpatient Medications  Medication Sig Dispense Refill   amLODipine (NORVASC) 5 MG tablet Take 1 tablet (5 mg total) by mouth daily. 90 tablet 1   anastrozole (ARIMIDEX) 1 MG tablet Take 1 tablet (1 mg total) by mouth daily. 90 tablet 2   b complex vitamins tablet Take 1 tablet by mouth daily.     CALCIUM PO Take 1,000 mg by mouth.     COD LIVER OIL/VITAMINS A & D CAPS Take by mouth.     Flaxseed, Linseed, (FLAX SEEDS PO) Take by mouth daily.     gabapentin (NEURONTIN) 300 MG capsule TAKE 1 CAPSULE BY MOUTH THREE TIMES DAILY 387 capsule 1   Garlic 5643 MG CAPS Take by mouth.     lisinopril (ZESTRIL) 10 MG tablet Take 1 tablet by mouth once daily 90 tablet 1  PAPAYA ENZYMES PO Take by mouth daily.     Potassium 99 MG TABS Take 99 mg by mouth daily.      BIOTIN PO Take 5,000 mcg by mouth daily. (Patient not taking: Reported on 09/05/2021)     Calcium Carbonate-Vitamin D (CALCIUM 500 + D PO) Take 2 tablets by mouth daily. (Patient not taking: Reported on 09/05/2021)     Turmeric 500 MG CAPS Take 1 capsule by mouth daily. (Patient not taking: Reported on 09/05/2021)     No current facility-administered medications for this visit.    OBJECTIVE: Physical Exam HENT:     Head: Normocephalic and atraumatic.     Mouth/Throat:     Pharynx: No oropharyngeal exudate.  Eyes:     Pupils: Pupils are equal, round, and reactive to light.  Cardiovascular:     Rate and Rhythm: Normal rate and regular rhythm.  Pulmonary:     Effort: No respiratory distress.     Breath sounds: No wheezing.  Abdominal:     General: Bowel sounds are normal. There is  no distension.     Palpations: Abdomen is soft. There is no mass.     Tenderness: There is no abdominal tenderness. There is no guarding or rebound.  Musculoskeletal:        General: No tenderness. Normal range of motion.     Cervical back: Normal range of motion and neck supple.  Skin:    General: Skin is warm.  Neurological:     Mental Status: She is alert and oriented to person, place, and time.  Psychiatric:        Mood and Affect: Affect normal.      Vitals:   09/05/21 1032  BP: (!) 157/92  Pulse: 76  Temp: (!) 95.6 F (35.3 C)  SpO2: 100%     Body mass index is 22.24 kg/m.    ECOG FS:0 - Asymptomatic  LAB RESULTS:  Appointment on 09/05/2021  Component Date Value Ref Range Status   Sodium 09/05/2021 139  135 - 145 mmol/L Final   Potassium 09/05/2021 3.6  3.5 - 5.1 mmol/L Final   Chloride 09/05/2021 101  98 - 111 mmol/L Final   CO2 09/05/2021 30  22 - 32 mmol/L Final   Glucose, Bld 09/05/2021 104 (H)  70 - 99 mg/dL Final   Glucose reference range applies only to samples taken after fasting for at least 8 hours.   BUN 09/05/2021 14  8 - 23 mg/dL Final   Creatinine, Ser 09/05/2021 0.83  0.44 - 1.00 mg/dL Final   Calcium 09/05/2021 9.2  8.9 - 10.3 mg/dL Final   Total Protein 09/05/2021 6.6  6.5 - 8.1 g/dL Final   Albumin 09/05/2021 4.0  3.5 - 5.0 g/dL Final   AST 09/05/2021 24  15 - 41 U/L Final   ALT 09/05/2021 17  0 - 44 U/L Final   Alkaline Phosphatase 09/05/2021 41  38 - 126 U/L Final   Total Bilirubin 09/05/2021 0.5  0.3 - 1.2 mg/dL Final   GFR, Estimated 09/05/2021 >60  >60 mL/min Final   Comment: (NOTE) Calculated using the CKD-EPI Creatinine Equation (2021)    Anion gap 09/05/2021 8  5 - 15 Final   Performed at [facility], [place], [place] [address]   WBC 09/05/2021 2.3 (L)  4.0 - 10.5 K/uL Final   RBC 09/05/2021 4.30  3.87 - 5.11 MIL/uL Final   Hemoglobin 09/05/2021 12.9  12.0 - 15.0 g/dL Final  HCT  09/05/2021 39.1  36.0 - 46.0 % Final   MCV 09/05/2021 90.9  80.0 - 100.0 fL Final   MCH 09/05/2021 30.0  26.0 - 34.0 pg Final   MCHC 09/05/2021 33.0  30.0 - 36.0 g/dL Final   RDW 09/05/2021 12.4  11.5 - 15.5 % Final   Platelets 09/05/2021 273  150 - 400 K/uL Final   nRBC 09/05/2021 0.0  0.0 - 0.2 % Final   Neutrophils Relative % 09/05/2021 48  % Final   Neutro Abs 09/05/2021 1.1 (L)  1.7 - 7.7 K/uL Final   Lymphocytes Relative 09/05/2021 37  % Final   Lymphs Abs 09/05/2021 0.9  0.7 - 4.0 K/uL Final   Monocytes Relative 09/05/2021 9  % Final   Monocytes Absolute 09/05/2021 0.2  0.1 - 1.0 K/uL Final   Eosinophils Relative 09/05/2021 4  % Final   Eosinophils Absolute 09/05/2021 0.1  0.0 - 0.5 K/uL Final   Basophils Relative 09/05/2021 2  % Final   Basophils Absolute 09/05/2021 0.0  0.0 - 0.1 K/uL Final   Immature Granulocytes 09/05/2021 0  % Final   Abs Immature Granulocytes 09/05/2021 0.00  0.00 - 0.07 K/uL Final   Performed at [facility], [address] [place], [place] [address]     STUDIES: No results found.  ASSESSMENT:   Carcinoma of upper-inner quadrant of right breast in female, estrogen receptor positive ([place]) # Stage I ER/PR positive HER-2/neu negative;  On Anastrazole on extended AI [until 2027].  Stable.  Tolerating very well; DEC 2022-Mammo-WNL.   # No clinical evidence of recurrence.  Continue anastrozole at this time.  Tolerating without any major side effects.   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   # Grade 2 neuropathy-STABLE.;Neurontin 300 mg TID.   # Elevated BP 152/94- recommend checking at home; keep a log/bring to PCP attention.   # DISPOSITION:  # follow up in 6 months-MD/labs- cbc/cmp; -Dr.B.   No matching staging information was found for  the patient.  Cammie Sickle, MD   09/05/2021 11:21 AM

## 2021-09-05 NOTE — Progress Notes (Signed)
She would like to discuss a letter she received about her mammogram.

## 2021-09-05 NOTE — Assessment & Plan Note (Addendum)
#  Stage I ER/PR positive HER-2/neu negative;  On Anastrazole on extended AI [until 2027].  Stable.  Tolerating very well; DEC 2022-Mammo-WNL.   # No clinical evidence of recurrence.  Continue anastrozole at this time.  Tolerating without any major side effects.   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   # Grade 2 neuropathy-STABLE.;Neurontin 300 mg TID.   # Elevated BP 152/94- recommend checking at home; keep a log/bring to PCP attention.   # DISPOSITION:  # follow up in 6 months-MD/labs- cbc/cmp; -Dr.B.

## 2021-09-12 DIAGNOSIS — R2232 Localized swelling, mass and lump, left upper limb: Secondary | ICD-10-CM | POA: Diagnosis not present

## 2021-09-18 DIAGNOSIS — R2232 Localized swelling, mass and lump, left upper limb: Secondary | ICD-10-CM | POA: Diagnosis not present

## 2021-11-19 ENCOUNTER — Ambulatory Visit: Payer: Medicare PPO | Admitting: Dermatology

## 2021-11-19 DIAGNOSIS — S01312A Laceration without foreign body of left ear, initial encounter: Secondary | ICD-10-CM

## 2021-11-19 DIAGNOSIS — L821 Other seborrheic keratosis: Secondary | ICD-10-CM | POA: Diagnosis not present

## 2021-11-19 DIAGNOSIS — S01311A Laceration without foreign body of right ear, initial encounter: Secondary | ICD-10-CM

## 2021-11-19 DIAGNOSIS — L811 Chloasma: Secondary | ICD-10-CM | POA: Diagnosis not present

## 2021-11-19 DIAGNOSIS — S01319A Laceration without foreign body of unspecified ear, initial encounter: Secondary | ICD-10-CM

## 2021-11-19 DIAGNOSIS — L988 Other specified disorders of the skin and subcutaneous tissue: Secondary | ICD-10-CM

## 2021-11-19 NOTE — Patient Instructions (Addendum)
Melasma is a chronic condition of persistent pigmented patches generally on the face, worse in summer due to higher UV exposure.  Oral estrogen containing BCPs or supplements can exacerbate condition.  Recommend daily broad spectrum tinted sunscreen SPF 30+ to face, preferably with Zinc or Titanium Dioxide. Discussed Rx topical bleaching creams (i.e. hydroquinone), OTC HelioCare supplement, chemical peels (would need multiple for best result).  ? ?Continue to hold off on cream treatment for another month and then start Will prescribe [facility] Hydroquinone 12%/kojic acid/vitamin C cream pea sized amount twice daily to the entire face for up to 3 months. This cannot be used more than 3 months due to risk of exogenous ochronosis (permanent dark spots). The patient was advised this is not covered by insurance since it is made by a compounding pharmacy. They will receive an email to check out and the medication will be mailed to their home.  ? ? ? ?Instructions for [facility] Medications ? ?One or more of your medications was sent to the [facility] mail order compounding pharmacy. You will receive an email from them and can purchase the medicine through that link. It will then be mailed to your home at the address you confirmed. If for any reason you do not receive an email from them, please check your spam folder. If you still do not find the email, please let us know. [facility] phone number is [phone].  ? ? ?Seborrheic Keratosis ? ?What causes seborrheic keratoses? ?Seborrheic keratoses are harmless, common skin growths that first appear during adult life.  As time goes by, more growths appear.  Some people may develop a large number of them.  Seborrheic keratoses appear on both covered and uncovered body parts.  They are not caused by sunlight.  The tendency to develop seborrheic keratoses can be inherited.  They vary in color from skin-colored to gray, brown, or even black.  They can be  either smooth or have a rough, warty surface.   ?Seborrheic keratoses are superficial and look as if they were stuck on the skin.  Under the microscope this type of keratosis looks like layers upon layers of skin.  That is why at times the top layer may seem to fall off, but the rest of the growth remains and re-grows.   ? ?Treatment ?Seborrheic keratoses do not need to be treated, but can easily be removed in the office.  Seborrheic keratoses often cause symptoms when they rub on clothing or jewelry.  Lesions can be in the way of shaving.  If they become inflamed, they can cause itching, soreness, or burning.  Removal of a seborrheic keratosis can be accomplished by freezing, burning, or surgery. ?If any spot bleeds, scabs, or grows rapidly, please return to have it checked, as these can be an indication of a skin cancer. ? ?Recommend daily broad spectrum sunscreen SPF 30+ to sun-exposed areas, reapply every 2 hours as needed. Call for new or changing lesions.  ?Staying in the shade or wearing long sleeves, sun glasses (UVA+UVB protection) and wide brim hats (4-inch brim around the entire circumference of the hat) are also recommended for sun protection.   ? ? ? ?If You Need Anything After Your Visit ? ?If you have any questions or concerns for your doctor, please call our main line at [phone] and press option 4 to reach your doctor's medical assistant. If no one answers, please leave a voicemail as directed and we will return your call as soon as  possible. Messages left after 4 pm will be answered the following business day.  ? ?You may also send [place] a message via MyChart. We typically respond to MyChart messages within 1-2 business days. ? ?For prescription refills, please ask your pharmacy to contact our office. Our fax number is [phone]. ? ?If you have an urgent issue when the clinic is closed that cannot wait until the next business day, you can page your doctor at the number below.   ? ?Please note  that while we do our best to be available for urgent issues outside of office hours, we are not available 24/7.  ? ?If you have an urgent issue and are unable to reach [place], you may choose to seek medical care at your doctor's office, retail clinic, urgent care center, or emergency room. ? ?If you have a medical emergency, please immediately call 911 or go to the emergency department. ? ?Pager Numbers ? ?- Dr. Nehemiah Massed: [phone] ? ?- Dr. Laurence Ferrari: [phone] ? ?- Dr. Nicole Kindred: [phone] ? ?In the event of inclement weather, please call our main line at [phone] for an update on the status of any delays or closures. ? ?Dermatology Medication Tips: ?Please keep the boxes that topical medications come in in order to help keep track of the instructions about where and how to use these. Pharmacies typically print the medication instructions only on the boxes and not directly on the medication tubes.  ? ?If your medication is too expensive, please contact our office at [phone] option 4 or send [place] a message through [place].  ? ?We are unable to tell what your co-pay for medications will be in advance as this is different depending on your insurance coverage. However, we may be able to find a substitute medication at lower cost or fill out paperwork to get insurance to cover a needed medication.  ? ?If a prior authorization is required to get your medication covered by your insurance company, please allow [place] 1-2 business days to complete this process. ? ?Drug prices often vary depending on where the prescription is filled and some pharmacies may offer cheaper prices. ? ?The website www.goodrx.com contains coupons for medications through different pharmacies. The prices here do not account for what the cost may be with help from insurance (it may be cheaper with your insurance), but the website can give you the price if you did not use any insurance.  ?- You can print the associated coupon and take it with your  prescription to the pharmacy.  ?- You may also stop by our office during regular business hours and pick up a GoodRx coupon card.  ?- If you need your prescription sent electronically to a different pharmacy, notify our office through [facility] or by phone at [phone] option 4. ? ? ? ? ?Si Usted Necesita Algo Despu?s de Su Visita ? ?Tambi?n puede enviarnos un mensaje a trav?s de MyChart. Por lo general respondemos a los mensajes de MyChart en el transcurso de 1 a 2 d?as h?biles. ? ?Para renovar recetas, por favor pida a su farmacia que se ponga en contacto con nuestra oficina. Nuestro n?mero de fax es el [phone]. ? ?Si tiene un asunto urgente cuando la cl?nica est? cerrada y que no puede esperar hasta el siguiente d?a h?bil, puede llamar/localizar a su doctor(a) al n?mero que aparece a continuaci?n.  ? ?Por favor, tenga en cuenta que aunque hacemos todo lo posible para estar disponibles para asuntos urgentes fuera del horario de [place],  no estamos disponibles las 24 horas del d?a, los 7 d?as de la semana.  ? ?Si tiene un problema urgente y no puede comunicarse con nosotros, puede optar por buscar atenci?n m?dica  en el consultorio de su doctor(a), en una cl?nica privada, en un centro de atenci?n urgente o en una sala de emergencias. ? ?Si tiene Engineer, maintenance (IT) m?dica, por favor llame inmediatamente al 911 o vaya a la sala de emergencias. ? ?N?meros de b?per ? ?- Dr. Nehemiah Massed: [phone] ? ?- Dra. Moye: [phone] ? ?- Dra. Nicole Kindred: [phone] ? ?En caso de inclemencias del tiempo, por favor llame a nuestra l?nea principal al [phone] para una actualizaci?n sobre el estado de cualquier retraso o cierre. ? ?Consejos para la medicaci?n en dermatolog?a: ?Por favor, guarde las cajas en las que vienen los medicamentos de uso t?pico para ayudarle a seguir las instrucciones sobre d?nde y c?mo usarlos. Las farmacias generalmente imprimen las instrucciones del medicamento s?lo en las cajas y no  directamente en los tubos del [place].  ? ?Si su medicamento es muy caro, por favor, p?ngase en contacto con Zigmund Daniel llamando al [phone] y presione la opci?n 4 o env?enos un mensaje a trav?s

## 2021-11-19 NOTE — Progress Notes (Signed)
? ?Follow-Up Visit ?  ?Subjective  ?Sandra [name] is a 76 y.o. female who presents for the following: Melasma (Of the face - patient currently using [facility] mix QHS.). ?Patient is concerned about tears of her earlobes and wonder if it can be repaired. ?The patient has spots, moles and lesions to be evaluated, some may be new or changing and the patient has concerns that these could be cancer. ? ?The following portions of the chart were reviewed this encounter and updated as appropriate:  Tobacco  Allergies  Meds  Problems  Med Hx  Surg Hx  Fam Hx   ?  ?Review of Systems: No other skin or systemic complaints except as noted in HPI or Assessment and Plan. ? ?Objective  ?Well appearing patient in no apparent distress; mood and affect are within normal limits. ? ?A focused examination was performed including face. Relevant physical exam findings are noted in the Assessment and Plan. ? ?Head - Anterior (Face) ?Reticulated hyperpigmented patches.  ? ? ? ? ? ? ? ? ? ? ?face ?Rhytides and volume loss. ?b/l earlobes with elongated pierced ear openings. ? ?Assessment & Plan  ?Melasma ?Head - Anterior (Face) ?Compared with photos ?Melasma is a chronic; persistent condition of hyperpigmented patches generally on the face, worse in summer due to higher UV exposure.    ?Heredity; thyroid disease; sun exposure; pregnancy; birth control pills; epilepsy medication and darker skin may predispose to Melasma.   ?Recommend Sun avoidance and daily broad spectrum (UVA/UVB) sunscreen SPF 30+, preferably with Zinc or Titanium Dioxide. ?Rx topical bleaching creams (i.e. hydroquinone) is a common treatment but should not be used long term.  Hydroquinones may be mixed with retinoids; steroids; Kojic Acid. ?Rx Azelaic Acid is also a treatment option that is safe for pregnancy (Category B). ?OTC Heliocare can be helpful in control and prevention. ?Oral Rx with Tanexamic Acid 250 mg once or twice daily can be used for  moderate to severe cases especially during summer (contraindications include pregnancy; lactation; hx of PE; DVT; clotting disorder; heart disease; anticoagulant use and upcoming long trips) OTC HelioCare supplement, chemical peels (would need multiple for best result).  ?Lasers and Chemical Peels and Microdermabrasion may also be helpful adjunct treatments. ? ?Will change treatment from:  ?Tretinoin 0.025%, HQ 8%, Niacinamide 4%, Kojic Acid 1%, Fluocinolone 0.025% cream  ?to  ?[facility] Hydroquinone 12%/kojic acid/vitamin C cream ? ? Will prescribe [facility] Hydroquinone 12%/kojic acid/vitamin C cream pea sized amount twice daily to the entire face for up to 3 months. This cannot be used more than 3 months due to risk of exogenous ochronosis (permanent dark spots). The patient was advised this is not covered by insurance since it is made by a compounding pharmacy. They will receive an email to check out and the medication will be mailed to their home.  ? ?Continue to hold off on cream treatment for another month and then start  ? ?Discussed Tranexamic acid and heliocare - patient Patient deferred pill treatment at this time. ? ?Continue to be good with sunscreen daily ? Elta Md samples given  ? ?Elastosis of skin ?face ?Concerned with lines around mouth - due to weight loss and age  ?Discussed filler and face lifts  ?Nothing over the counter will likely help ? ?Tear of earlobe, unspecified laterality, initial encounter ?b/l earlobes ?Concerned with enlarged holes at earlobes ?Discussed surgery to correct ?Patient deferred treatment at this time. ? ?Seborrheic Keratoses ?- Stuck-on, waxy, tan-brown papules and/or  plaques at left waistline and right medial thigh  ?- Benign-appearing ?- Discussed benign etiology and prognosis. ?- Observe ?- Call for any changes ? ?Return for 5 - 6 month melasma follow up. ? ?I, Ruthell Rummage, CMA, am acting as scribe for Sarina Ser, MD. ?Documentation: I have reviewed  the above documentation for accuracy and completeness, and I agree with the above. ? ?Sarina Ser, MD ? ?

## 2021-11-21 ENCOUNTER — Other Ambulatory Visit: Payer: Self-pay | Admitting: Nurse Practitioner

## 2021-11-21 DIAGNOSIS — I10 Essential (primary) hypertension: Secondary | ICD-10-CM

## 2021-11-23 ENCOUNTER — Encounter: Payer: Self-pay | Admitting: Dermatology

## 2021-11-27 ENCOUNTER — Ambulatory Visit: Payer: Medicare PPO | Admitting: Nurse Practitioner

## 2021-11-27 ENCOUNTER — Encounter: Payer: Self-pay | Admitting: Nurse Practitioner

## 2021-11-27 VITALS — BP 130/76 | HR 76 | Ht 62.0 in | Wt 121.7 lb

## 2021-11-27 DIAGNOSIS — L603 Nail dystrophy: Secondary | ICD-10-CM

## 2021-11-27 DIAGNOSIS — G620 Drug-induced polyneuropathy: Secondary | ICD-10-CM

## 2021-11-27 DIAGNOSIS — Z170 Estrogen receptor positive status [ER+]: Secondary | ICD-10-CM | POA: Diagnosis not present

## 2021-11-27 DIAGNOSIS — E7800 Pure hypercholesterolemia, unspecified: Secondary | ICD-10-CM

## 2021-11-27 DIAGNOSIS — I10 Essential (primary) hypertension: Secondary | ICD-10-CM

## 2021-11-27 DIAGNOSIS — C50211 Malignant neoplasm of upper-inner quadrant of right female breast: Secondary | ICD-10-CM

## 2021-11-27 DIAGNOSIS — T451X5A Adverse effect of antineoplastic and immunosuppressive drugs, initial encounter: Secondary | ICD-10-CM

## 2021-11-27 LAB — IBC + FERRITIN
Ferritin: 40.5 ng/mL (ref 10.0–291.0)
Iron: 105 ug/dL (ref 42–145)
Saturation Ratios: 29.6 % (ref 20.0–50.0)
TIBC: 354.2 ug/dL (ref 250.0–450.0)
Transferrin: 253 mg/dL (ref 212.0–360.0)

## 2021-11-27 NOTE — Assessment & Plan Note (Signed)
Currently maintained on gabapentin 300 mg 3 times daily for neuropathic pain induced by chemotherapy.  Patient tolerating medication well.  No concerns for overuse or abuse.  Patient denies any recent falls.  Continue taking medication as prescribed ?

## 2021-11-27 NOTE — Assessment & Plan Note (Signed)
Currently controlled on diet modifications and exercise.  Last lipid panel within normal limits will defer lipid panel until next office visit which we patient is physical.  Continue healthy lifestyle modifications. ?

## 2021-11-27 NOTE — Progress Notes (Signed)
? ?Established Patient Office Visit ? ?Subjective   ?Patient ID: Sandra [name], female    DOB: [birth year]  Age: 76 y.o. MRN: [id number] ? ?Chief Complaint  ?Patient presents with  ? Follow-up  ?  6 month follow up , no knew concerns   ? ? ?HPI ? ?HTN: states that she does check her blood pressure at home and it was good 137/73. Tolerating medications well ? ?Neuropathic: Patient currently maintained on gabapentin tolerating it well.  No recent falls per patient report.  She does take this as her neuropathic pain was induced from chemotherapy from cancer in the past. ? 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 ? ?HLD: States that she eats small breakfast (lite, like fruit smoothy) heavy meal around 3pm and snack in the evening. Water and will have iced tea with stevia with tea ? ?Walks 3 miles a day. 5-6 days 45 mins at a time ? ? ? ? ? ?Review of Systems  ?Constitutional:  Negative for chills, fever and weight loss.  ?Respiratory:  Negative for cough and shortness of breath.   ?Cardiovascular:  Negative for chest pain and leg swelling.  ?Gastrointestinal:  Negative for abdominal pain, constipation, nausea and vomiting.  ?     BM daily  ?Genitourinary:  Negative for dysuria and hematuria.  ?     Nocturia "intermittent with fluids  ?Neurological:  Positive for tingling. Negative for dizziness, weakness and headaches.  ?Psychiatric/Behavioral:  Negative for hallucinations and suicidal ideas.   ? ?  ?Objective:  ?  ? ?BP 130/76   Pulse 76   Ht '5\' 2"'$  (1.575 m)   Wt 121 lb 11.2 oz (55.2 kg)   LMP  (LMP Unknown)   SpO2 98%   BMI 22.26 kg/m?  ?Wt Readings from Last 3 Encounters:  ?11/27/21 121 lb 11.2 oz (55.2 kg)  ?09/05/21 121 lb 9.6 oz (55.2 kg)  ?05/29/21 122 lb (55.3 kg)  ? ?  ? ?Physical Exam ?Constitutional:   ?   Appearance: Normal appearance.  ?HENT:  ?   Right Ear: Tympanic membrane, ear canal and external ear normal.  ?   Left  Ear: Tympanic membrane, ear canal and external ear normal.  ?   Mouth/Throat:  ?   Mouth: Mucous membranes are moist.  ?   Pharynx: Oropharynx is clear.  ?Cardiovascular:  ?   Rate and Rhythm: Normal rate and regular rhythm.  ?   Heart sounds: Normal heart sounds.  ?Pulmonary:  ?   Effort: Pulmonary effort is normal.  ?   Breath sounds: Normal breath sounds.  ?Musculoskeletal:  ?   Right lower leg: No edema.  ?   Left lower leg: No edema.  ?Lymphadenopathy:  ?   Cervical: No cervical adenopathy.  ?Skin: ?   General: Skin is warm.  ?   Comments: Patient had cracked nail on right thumb and left fourth finger  ?Neurological:  ?   Mental Status: She is alert.  ?Psychiatric:     ?   Mood and Affect: Mood normal.     ?   Behavior: Behavior normal.     ?   Thought Content: Thought content normal.     ?   Judgment: Judgment normal.  ? ? ? ?No results found for any visits on 11/27/21. ? ? ? ?The 10-year ASCVD risk score (Arnett DK, et al., 2019) is: 17.4% ? ?  ?Assessment &  Plan:  ? ?Problem List Items Addressed This Visit   ? ?  ? Cardiovascular and Mediastinum  ? Hypertension  ?  Patient well-controlled on lisinopril 10 mg and amlodipine 5 mg.  Continue taking blood pressure medication as prescribed did encouraged to check blood pressure a couple times a week at home. ? ?  ?  ?  ? Nervous and Auditory  ? Neuropathy due to chemotherapeutic drug ([place])  ?  Currently maintained on gabapentin 300 mg 3 times daily for neuropathic pain induced by chemotherapy.  Patient tolerating medication well.  No concerns for overuse or abuse.  Patient denies any recent falls.  Continue taking medication as prescribed ? ?  ?  ?  ? Other  ? Hyperlipidemia  ?  Currently controlled on diet modifications and exercise.  Last lipid panel within normal limits will defer lipid panel until next office visit which we patient is physical.  Continue healthy lifestyle modifications. ? ?  ?  ? Carcinoma of upper-inner quadrant of right breast in female,  estrogen receptor positive ([place])  ?  Currently followed by oncology.  Continue following up as recommended ? ?  ?  ? ?Other Visit Diagnoses   ? ? Brittle nails    -  Primary  ? Relevant Orders  ? IBC + Ferritin  ? ?  ? ? ?Return in about 6 months (around 05/30/2022) for after 05/30/2022 for CPE.  ? ? ?Romilda Garret, NP ? ?

## 2021-11-27 NOTE — Patient Instructions (Signed)
Nice to see you today ?I will be in touch with the lab results once I have them ?Follow up with me in 6 months for you physcial, after 05/29/2022, sooner if you need me ?

## 2021-11-27 NOTE — Assessment & Plan Note (Signed)
Currently followed by oncology.  Continue following up as recommended ?

## 2021-11-27 NOTE — Assessment & Plan Note (Signed)
Patient well-controlled on lisinopril 10 mg and amlodipine 5 mg.  Continue taking blood pressure medication as prescribed did encouraged to check blood pressure a couple times a week at home. ?

## 2021-12-17 DIAGNOSIS — H5213 Myopia, bilateral: Secondary | ICD-10-CM | POA: Diagnosis not present

## 2021-12-17 DIAGNOSIS — H43813 Vitreous degeneration, bilateral: Secondary | ICD-10-CM | POA: Diagnosis not present

## 2022-02-19 ENCOUNTER — Other Ambulatory Visit: Payer: Self-pay | Admitting: Internal Medicine

## 2022-02-19 DIAGNOSIS — Z170 Estrogen receptor positive status [ER+]: Secondary | ICD-10-CM

## 2022-02-19 DIAGNOSIS — Z95828 Presence of other vascular implants and grafts: Secondary | ICD-10-CM

## 2022-03-04 DIAGNOSIS — H9313 Tinnitus, bilateral: Secondary | ICD-10-CM | POA: Diagnosis not present

## 2022-03-04 DIAGNOSIS — H6123 Impacted cerumen, bilateral: Secondary | ICD-10-CM | POA: Diagnosis not present

## 2022-03-06 ENCOUNTER — Inpatient Hospital Stay: Payer: Medicare PPO | Admitting: Internal Medicine

## 2022-03-06 ENCOUNTER — Inpatient Hospital Stay: Payer: Medicare PPO | Attending: Internal Medicine

## 2022-03-06 ENCOUNTER — Encounter: Payer: Self-pay | Admitting: Internal Medicine

## 2022-03-06 DIAGNOSIS — Z170 Estrogen receptor positive status [ER+]: Secondary | ICD-10-CM

## 2022-03-06 DIAGNOSIS — D708 Other neutropenia: Secondary | ICD-10-CM | POA: Insufficient documentation

## 2022-03-06 DIAGNOSIS — Z79811 Long term (current) use of aromatase inhibitors: Secondary | ICD-10-CM | POA: Insufficient documentation

## 2022-03-06 DIAGNOSIS — Z923 Personal history of irradiation: Secondary | ICD-10-CM | POA: Insufficient documentation

## 2022-03-06 DIAGNOSIS — Z9221 Personal history of antineoplastic chemotherapy: Secondary | ICD-10-CM | POA: Diagnosis not present

## 2022-03-06 DIAGNOSIS — M8580 Other specified disorders of bone density and structure, unspecified site: Secondary | ICD-10-CM | POA: Insufficient documentation

## 2022-03-06 DIAGNOSIS — Z803 Family history of malignant neoplasm of breast: Secondary | ICD-10-CM | POA: Diagnosis not present

## 2022-03-06 DIAGNOSIS — G620 Drug-induced polyneuropathy: Secondary | ICD-10-CM | POA: Diagnosis not present

## 2022-03-06 DIAGNOSIS — C50211 Malignant neoplasm of upper-inner quadrant of right female breast: Secondary | ICD-10-CM | POA: Insufficient documentation

## 2022-03-06 DIAGNOSIS — Z79899 Other long term (current) drug therapy: Secondary | ICD-10-CM | POA: Diagnosis not present

## 2022-03-06 LAB — CBC WITH DIFFERENTIAL/PLATELET
Abs Immature Granulocytes: 0 10*3/uL (ref 0.00–0.07)
Basophils Absolute: 0 10*3/uL (ref 0.0–0.1)
Basophils Relative: 1 %
Eosinophils Absolute: 0.1 10*3/uL (ref 0.0–0.5)
Eosinophils Relative: 2 %
HCT: 39.2 % (ref 36.0–46.0)
Hemoglobin: 12.8 g/dL (ref 12.0–15.0)
Immature Granulocytes: 0 %
Lymphocytes Relative: 32 %
Lymphs Abs: 0.8 10*3/uL (ref 0.7–4.0)
MCH: 30 pg (ref 26.0–34.0)
MCHC: 32.7 g/dL (ref 30.0–36.0)
MCV: 91.8 fL (ref 80.0–100.0)
Monocytes Absolute: 0.2 10*3/uL (ref 0.1–1.0)
Monocytes Relative: 7 %
Neutro Abs: 1.3 10*3/uL — ABNORMAL LOW (ref 1.7–7.7)
Neutrophils Relative %: 58 %
Platelets: 277 10*3/uL (ref 150–400)
RBC: 4.27 MIL/uL (ref 3.87–5.11)
RDW: 12.5 % (ref 11.5–15.5)
WBC: 2.3 10*3/uL — ABNORMAL LOW (ref 4.0–10.5)
nRBC: 0 % (ref 0.0–0.2)

## 2022-03-06 LAB — COMPREHENSIVE METABOLIC PANEL
ALT: 20 U/L (ref 0–44)
AST: 24 U/L (ref 15–41)
Albumin: 4.1 g/dL (ref 3.5–5.0)
Alkaline Phosphatase: 44 U/L (ref 38–126)
Anion gap: 7 (ref 5–15)
BUN: 13 mg/dL (ref 8–23)
CO2: 28 mmol/L (ref 22–32)
Calcium: 9.3 mg/dL (ref 8.9–10.3)
Chloride: 104 mmol/L (ref 98–111)
Creatinine, Ser: 0.79 mg/dL (ref 0.44–1.00)
GFR, Estimated: >60 mL/min (ref >60)
Glucose, Bld: 118 mg/dL — ABNORMAL HIGH (ref 70–99)
Potassium: 3.8 mmol/L (ref 3.5–5.1)
Sodium: 139 mmol/L (ref 135–145)
Total Bilirubin: 0.6 mg/dL (ref 0.3–1.2)
Total Protein: 6.9 g/dL (ref 6.5–8.1)

## 2022-03-06 NOTE — Progress Notes (Signed)
[place] @ [facility] Telephone:([phone]  [phone]   INITIAL CONSULT  Sandra [name] OB: 07-28-1946  MR#: [id number]  BSJ#:[id number]  Patient Care Team: Michela Pitcher, NP as PCP - General (Family Medicine) Glennie Isle, PA-C (Physician Assistant) Pyrtle, Lajuan Lines, MD as Consulting Physician (Gastroenterology) Lucille Passy, MD (Inactive) as Consulting Physician (Family Medicine) Christene Lye, MD (General Surgery) Cammie Sickle, MD as Consulting Physician (Internal Medicine) Leona Singleton, RN as Oncology Nurse Navigator Noreene Filbert, MD as Referring Physician (Radiation Oncology)  CHIEF COMPLAINT:  Chief Complaint  Patient presents with   Follow-up    Carcinoma of upper-inner quadrant of right breast in female, estrogen receptor positive ([place])   VISIT DIAGNOSIS:     ICD-10-CM   1. Carcinoma of upper-inner quadrant of right breast in female, estrogen receptor positive ([place])  C50.211 MM 3D SCREEN BREAST BILATERAL   Z17.0 CBC with Differential/Platelet    Comprehensive metabolic panel       Oncology History Overview Note   carcinoma breast (right) inner and upper quadrant T1 cN0 M0 tumor Estrogen receptor positive progesterone receptor positive HER-2 receptor negative by fish Mammo print shows high risk (diagnosis in January of 2016) patient has 22% average risk in 5 years and 29% average risk in 10 years for distant metastectomy disease without chemotherapy on anti-hormonal treatment 2.  Patient started Cytoxan and Adriamycin on now August 30, 2015 MUGA scan shows ejection fraction of baseline 55%.  3.Patient has finished total of 4 cycles of chemotherapy on November 01, 2015 4.  Started on the Taxol from November 22, 2015; FINISHED RT [sep 2017] # OCT 2017- START ARIMIDEX   # NOV 2021 BMD- T score= - 1.9 -OSTEOPENIA- ca+vit D  # SURVIVORSHIP: O  --------------------------------------------------------   DIAGNOSIS: RIGHT  BREAST CA  STAGE: I        ;GOALS: cure  CURRENT/MOST RECENT THERAPY: on anastrazole      Carcinoma of upper-inner quadrant of right breast in female, estrogen receptor positive ([place])    INTERVAL HISTORY: Alone.  Ambulating independently.  76 year old lady History of stage I right-sided breast cancer ER/PR positive currently on Arimidex is here for follow-up.  Patient denies any worsening joint pains or bone pain.  Patient has intermittent cramps in the legs.  Otherwise denies any worsening tingling or numbness.  She continues with gabapentin.   Review of Systems  Constitutional:  Negative for chills, diaphoresis, fever, malaise/fatigue and weight loss.  HENT:  Negative for nosebleeds and sore throat.   Eyes:  Negative for double vision.  Respiratory:  Negative for cough, hemoptysis, sputum production, shortness of breath and wheezing.   Cardiovascular:  Negative for chest pain, palpitations, orthopnea and leg swelling.  Gastrointestinal:  Negative for abdominal pain, blood in stool, constipation, diarrhea, heartburn, melena, nausea and vomiting.  Genitourinary:  Negative for dysuria, frequency and urgency.  Musculoskeletal:  Negative for back pain and joint pain.  Skin: Negative.  Negative for itching and rash.  Neurological:  Positive for tingling. Negative for dizziness, focal weakness, weakness and headaches.  Endo/Heme/Allergies:  Does not bruise/bleed easily.  Psychiatric/Behavioral:  Negative for depression. The patient is not nervous/anxious and does not have insomnia.      PAST MEDICAL HISTORY: Past Medical History:  Diagnosis Date   Breast cancer ([place]) 07/13/2015   RIGHT lumpectomy 07/13/15, pT1c, pN0,M0 Stage 1 Er/PR pos, her 2 neg. MammaPrint High   Hyperlipidemia    Hypertension    Neuropathy  Neuropathy 2018   toes and fingers, due to chemotherapy   Personal history of chemotherapy 2016   Personal history of radiation therapy 2016   Visual blurriness     SOMETHING NEW THAT HAS DEVELOPED SINCE 07-13-15 SURGERY- SEEN AT [facility] ENT AND EXAM WAS NORMAL PER PT (08-09-15)    PAST SURGICAL HISTORY: Past Surgical History:  Procedure Laterality Date   ABDOMINAL HYSTERECTOMY  1987   BREAST BIOPSY Right    neg   BREAST BIOPSY Left    4 oclock, FIBROEPITHELIAL LESION FAVOR CELLULAR FIBROADENOMA   BREAST BIOPSY Left    2 oclock, CYSTIC APOCRINE METAPLASIA   BREAST BIOPSY Right 07/13/15   130 INVASIVE MAMMARY CARCINOMA, NO SPECIAL TYPE   BREAST EXCISIONAL BIOPSY Left 2016   fibroadenoma   BREAST LUMPECTOMY Left 07/13/2015   BREAST LUMPECTOMY WITH SENTINEL LYMPH NODE BIOPSY Right 07/13/2015   Procedure: BREAST LUMPECTOMY WITH SENTINEL LYMPH NODE BX;  Surgeon: Christene Lye, MD;  Location: [facility] ORS;  Service: General;  Laterality: Right;   BUNIONECTOMY Right 1993   CATARACT EXTRACTION W/PHACO Left 09/17/2016   Procedure: CATARACT EXTRACTION PHACO AND INTRAOCULAR LENS PLACEMENT ([place]);  Surgeon: Birder Robson, MD;  Location: [facility] ORS;  Service: Ophthalmology;  Laterality: Left;  [place] 00:47AP% 17.6CDE 8.39Fluid pack lot # [id number]   CATARACT EXTRACTION W/PHACO Right 01/21/2017   Procedure: CATARACT EXTRACTION PHACO AND INTRAOCULAR LENS PLACEMENT (IOC);  Surgeon: Birder Robson, MD;  Location: [facility] ORS;  Service: Ophthalmology;  Laterality: Right;  [place] 00:39 AP% 13.0 CDE 5.16 fluid pack lot # [id number]   COLONOSCOPY  2015   fatty tumor Left 2013   side   PORT-A-CATH REMOVAL  2017   PORTACATH PLACEMENT Left 08/16/2015   Procedure: INSERTION PORT-A-CATH;  Surgeon: Christene Lye, MD;  Location: [facility] ORS;  Service: General;  Laterality: Left;    FAMILY HISTORY Family History  Problem Relation Age of Onset   Cancer Sister 18       breast cancer   Breast cancer Sister 51   Colon cancer Neg Hx         ADVANCED DIRECTIVES:   Patient does not have any living will or healthcare power of attorney.  Information was given .  Available  resources had been discussed.   HEALTH MAINTENANCE: Social History   Tobacco Use   Smoking status: Never   Smokeless tobacco: Never  Vaping Use   Vaping Use: Never used  Substance Use Topics   Alcohol use: No    Alcohol/week: 0.0 standard drinks of alcohol   Drug use: No      :  Allergies  Allergen Reactions   Cymbalta [Duloxetine Hcl] Other (See Comments)    Glaucoma.  Unable to take   Lovastatin Anaphylaxis    Tongue swelling    Current Outpatient Medications  Medication Sig Dispense Refill   amLODipine (NORVASC) 5 MG tablet Take 1 tablet by mouth once daily 90 tablet 1   anastrozole (ARIMIDEX) 1 MG tablet Take 1 tablet by mouth once daily 90 tablet 0   b complex vitamins tablet Take 1 tablet by mouth daily.     CALCIUM PO Take 1,000 mg by mouth.     COD LIVER OIL/VITAMINS A & D CAPS Take by mouth.     Flaxseed, Linseed, (FLAX SEEDS PO) Take by mouth daily.     gabapentin (NEURONTIN) 300 MG capsule TAKE 1 CAPSULE BY MOUTH THREE TIMES DAILY 875 capsule 1   Garlic 6433 MG CAPS Take by  mouth.     lisinopril (ZESTRIL) 10 MG tablet Take 1 tablet by mouth once daily 90 tablet 1   Potassium 99 MG TABS Take 99 mg by mouth daily.      BIOTIN PO Take 5,000 mcg by mouth daily. (Patient not taking: Reported on 09/05/2021)     No current facility-administered medications for this visit.   Right and left BREAST exam [in the presence of nurse]- no unusual skin changes or dominant masses felt. Surgical scars noted.    OBJECTIVE: Physical Exam HENT:     Head: Normocephalic and atraumatic.     Mouth/Throat:     Pharynx: No oropharyngeal exudate.  Eyes:     Pupils: Pupils are equal, round, and reactive to light.  Cardiovascular:     Rate and Rhythm: Normal rate and regular rhythm.  Pulmonary:     Effort: No respiratory distress.     Breath sounds: No wheezing.  Abdominal:     General: Bowel sounds are normal. There is no distension.     Palpations: Abdomen is soft. There is no  mass.     Tenderness: There is no abdominal tenderness. There is no guarding or rebound.  Musculoskeletal:        General: No tenderness. Normal range of motion.     Cervical back: Normal range of motion and neck supple.  Skin:    General: Skin is warm.  Neurological:     Mental Status: She is alert and oriented to person, place, and time.  Psychiatric:        Mood and Affect: Affect normal.       Vitals:   03/06/22 1000  BP: (!) 146/87  Pulse: 72  Resp: 16  Temp: (!) 97.4 F (36.3 C)     Body mass index is 22.06 kg/m.    ECOG FS:0 - Asymptomatic  LAB RESULTS:  Appointment on 03/06/2022  Component Date Value Ref Range Status   Sodium 03/06/2022 139  135 - 145 mmol/L Final   Potassium 03/06/2022 3.8  3.5 - 5.1 mmol/L Final   Chloride 03/06/2022 104  98 - 111 mmol/L Final   CO2 03/06/2022 28  22 - 32 mmol/L Final   Glucose, Bld 03/06/2022 118 (H)  70 - 99 mg/dL Final   Glucose reference range applies only to samples taken after fasting for at least 8 hours.   BUN 03/06/2022 13  8 - 23 mg/dL Final   Creatinine, Ser 03/06/2022 0.79  0.44 - 1.00 mg/dL Final   Calcium 03/06/2022 9.3  8.9 - 10.3 mg/dL Final   Total Protein 03/06/2022 6.9  6.5 - 8.1 g/dL Final   Albumin 03/06/2022 4.1  3.5 - 5.0 g/dL Final   AST 03/06/2022 24  15 - 41 U/L Final   ALT 03/06/2022 20  0 - 44 U/L Final   Alkaline Phosphatase 03/06/2022 44  38 - 126 U/L Final   Total Bilirubin 03/06/2022 0.6  0.3 - 1.2 mg/dL Final   GFR, Estimated 03/06/2022 >60  >60 mL/min Final   Comment: (NOTE) Calculated using the CKD-EPI Creatinine Equation (2021)    Anion gap 03/06/2022 7  5 - 15 Final   Performed at [facility], [place],  [address]   WBC 03/06/2022 2.3 (L)  4.0 - 10.5 K/uL Final   RBC 03/06/2022 4.27  3.87 - 5.11 MIL/uL Final   Hemoglobin 03/06/2022 12.8  12.0 - 15.0 g/dL Final   HCT 03/06/2022 39.2  36.0 - 46.0 %  Final   MCV 03/06/2022 91.8  80.0 - 100.0 fL Final   MCH  03/06/2022 30.0  26.0 - 34.0 pg Final   MCHC 03/06/2022 32.7  30.0 - 36.0 g/dL Final   RDW 03/06/2022 12.5  11.5 - 15.5 % Final   Platelets 03/06/2022 277  150 - 400 K/uL Final   nRBC 03/06/2022 0.0  0.0 - 0.2 % Final   Neutrophils Relative % 03/06/2022 58  % Final   Neutro Abs 03/06/2022 1.3 (L)  1.7 - 7.7 K/uL Final   Lymphocytes Relative 03/06/2022 32  % Final   Lymphs Abs 03/06/2022 0.8  0.7 - 4.0 K/uL Final   Monocytes Relative 03/06/2022 7  % Final   Monocytes Absolute 03/06/2022 0.2  0.1 - 1.0 K/uL Final   Eosinophils Relative 03/06/2022 2  % Final   Eosinophils Absolute 03/06/2022 0.1  0.0 - 0.5 K/uL Final   Basophils Relative 03/06/2022 1  % Final   Basophils Absolute 03/06/2022 0.0  0.0 - 0.1 K/uL Final   Immature Granulocytes 03/06/2022 0  % Final   Abs Immature Granulocytes 03/06/2022 0.00  0.00 - 0.07 K/uL Final   Performed at [facility], [address] [place], [place] [address]     STUDIES: No results found.  ASSESSMENT:   Carcinoma of upper-inner quadrant of right breast in female, estrogen receptor positive ([place]) # Stage I ER/PR positive HER-2/neu negative;  On Anastrazole on extended AI [until 2027].  Stable.  Tolerating very well; DEC 2022-Mammo-WNL.   # No clinical evidence of recurrence.  Continue anastrozole at this time.  Tolerating without any major side effects.   # Osteopenia- BMD- FEB 2022- T score= -.1.9 [2020: T score-1.9]; Continue calcium and vitamin D; Continue exercise/walking 3 miles/day- STABLE; will order BMD in 2024.   # Chronic benign ethnic neutropenia /mild leucopenia- 2.3/ ANC-1.3-- STABLE.   # Grade 2 neuropathy-STABLE.Neurontin 300 mg TID.   # DISPOSITION:  # BIL screeeing mammogram dec 2023;  # follow up in 6 months-MD/labs- cbc/cmp; -Dr.B.   No matching staging information was found for the patient.  Cammie Sickle, MD   03/06/2022 1:38 PM

## 2022-03-06 NOTE — Assessment & Plan Note (Addendum)
#  Stage I ER/PR positive HER-2/neu negative;  On Anastrazole on extended AI [until 2027].  Stable.  Tolerating very well; DEC 2022-Mammo-WNL.   # No clinical evidence of recurrence.  Continue anastrozole at this time.  Tolerating without any major side effects.   # Osteopenia- BMD- FEB 2022- T score= -.1.9 [2020: T score-1.9]; Continue calcium and vitamin D; Continue exercise/walking 3 miles/day- STABLE; will order BMD in 2024.   # Chronic benign ethnic neutropenia /mild leucopenia- 2.3/ ANC-1.3-- STABLE.   # Grade 2 neuropathy-STABLE.Neurontin 300 mg TID.   # DISPOSITION:  # BIL screeeing mammogram dec 2023;  # follow up in 6 months-MD/labs- cbc/cmp; -Dr.B.

## 2022-03-06 NOTE — Progress Notes (Signed)
About once a week she will have leg cramps.  She will walk around to help but the cramps could last 5-10 minutes.

## 2022-04-10 ENCOUNTER — Other Ambulatory Visit: Payer: Self-pay | Admitting: Nurse Practitioner

## 2022-04-10 DIAGNOSIS — G620 Drug-induced polyneuropathy: Secondary | ICD-10-CM

## 2022-05-13 ENCOUNTER — Ambulatory Visit: Payer: Medicare PPO | Admitting: Dermatology

## 2022-05-13 DIAGNOSIS — L811 Chloasma: Secondary | ICD-10-CM | POA: Diagnosis not present

## 2022-05-13 NOTE — Progress Notes (Unsigned)
   Follow-Up Visit   Subjective  Sandra [name] is a 76 y.o. female who presents for the following: Follow-up (Melasma follow up - on a break from [facility] cream).  The following portions of the chart were reviewed this encounter and updated as appropriate:   Tobacco  Allergies  Meds  Problems  Med Hx  Surg Hx  Fam Hx     Review of Systems:  No other skin or systemic complaints except as noted in HPI or Assessment and Plan.  Objective  Well appearing patient in no apparent distress; mood and affect are within normal limits.  A focused examination was performed including face. Relevant physical exam findings are noted in the Assessment and Plan.   Assessment & Plan  Melasma Head - Anterior (Face)  Melasma is a chronic; persistent condition of hyperpigmented patches generally on the face, worse in summer due to higher UV exposure.    Heredity; thyroid disease; sun exposure; pregnancy; birth control pills; epilepsy medication and darker skin may predispose to Melasma.   Recommendations include: - Sun avoidance and daily broad spectrum (UVA/UVB) sunscreen SPF 30+, preferably with Zinc or Titanium Dioxide. - Rx topical bleaching creams (i.e. hydroquinone) is a common treatment but should not be used long term.  Hydroquinones may be mixed with retinoids; steroids; Kojic Acid. - Rx Azelaic Acid is also a treatment option that is safe for pregnancy (Category B). - OTC Heliocare can be helpful in control and prevention. - Oral Rx with Tranexamic Acid 250 mg - 650 mg po daily can be used for moderate to severe cases especially during summer (contraindications include pregnancy; lactation; hx of PE; DVT; clotting disorder; heart disease; anticoagulant use and upcoming long trips)   - Chemical peels (would need multiple for best result).  - Lasers and  Microdermabrasion may also be helpful adjunct treatments.  Restart [facility] Hydroquinone 12%/kojic acid/vitamin C  cream pea sized amount twice daily to the entire face for up to 3 months. This cannot be used more than 3 months due to risk of exogenous ochronosis (permanent dark spots). The patient was advised this is not covered by insurance since it is made by a compounding pharmacy. They will receive an email to check out and the medication will be mailed to their home.    After 3 months, recommend starting A-Luminate Brightening Serum from [facility] which can be purchased in our office.   Return in about 6 months (around 11/12/2022) for Follow up.  I, Ashok Cordia, CMA, am acting as scribe for Sarina Ser, MD . Documentation: I have reviewed the above documentation for accuracy and completeness, and I agree with the above.  Sarina Ser, MD

## 2022-05-13 NOTE — Patient Instructions (Signed)
Instructions for [facility] Medications  One or more of your medications was sent to the [facility] mail order compounding pharmacy. You will receive an email from them and can purchase the medicine through that link. It will then be mailed to your home at the address you confirmed. If for any reason you do not receive an email from them, please check your spam folder. If you still do not find the email, please let us know. [facility] phone number is [phone]. Use this cream for 3 months then stop. At that time, recommend starting A-Luminate Brightening Serum from [facility] which can be purchased in our office.    Due to recent changes in healthcare laws, you may see results of your pathology and/or laboratory studies on MyChart before the doctors have had a chance to review them. We understand that in some cases there may be results that are confusing or concerning to you. Please understand that not all results are received at the same time and often the doctors may need to interpret multiple results in order to provide you with the best plan of care or course of treatment. Therefore, we ask that you please give [place] 2 business days to thoroughly review all your results before contacting the office for clarification. Should we see a critical lab result, you will be contacted sooner.   If You Need Anything After Your Visit  If you have any questions or concerns for your doctor, please call our main line at [phone] and press option 4 to reach your doctor's medical assistant. If no one answers, please leave a voicemail as directed and we will return your call as soon as possible. Messages left after 4 pm will be answered the following business day.   You may also send [place] a message via [place]. We typically respond to MyChart messages within 1-2 business days.  For prescription refills, please ask your pharmacy to contact our office. Our fax number is [phone].  If you have an  urgent issue when the clinic is closed that cannot wait until the next business day, you can page your doctor at the number below.    Please note that while we do our best to be available for urgent issues outside of office hours, we are not available 24/7.   If you have an urgent issue and are unable to reach [place], you may choose to seek medical care at your doctor's office, retail clinic, urgent care center, or emergency room.  If you have a medical emergency, please immediately call 911 or go to the emergency department.  Pager Numbers  - Dr. Nehemiah Massed: [phone]  - Dr. Laurence Ferrari: [phone]  - Dr. Nicole Kindred: [phone]  In the event of inclement weather, please call our main line at [phone] for an update on the status of any delays or closures.  Dermatology Medication Tips: Please keep the boxes that topical medications come in in order to help keep track of the instructions about where and how to use these. Pharmacies typically print the medication instructions only on the boxes and not directly on the medication tubes.   If your medication is too expensive, please contact our office at [phone] option 4 or send [place] a message through [place].   We are unable to tell what your co-pay for medications will be in advance as this is different depending on your insurance coverage. However, we may be able to find a substitute medication at lower cost or fill out paperwork to get  insurance to cover a needed medication.   If a prior authorization is required to get your medication covered by your insurance company, please allow [place] 1-2 business days to complete this process.  Drug prices often vary depending on where the prescription is filled and some pharmacies may offer cheaper prices.  The website www.goodrx.com contains coupons for medications through different pharmacies. The prices here do not account for what the cost may be with help from insurance (it may be cheaper with your  insurance), but the website can give you the price if you did not use any insurance.  - You can print the associated coupon and take it with your prescription to the pharmacy.  - You may also stop by our office during regular business hours and pick up a GoodRx coupon card.  - If you need your prescription sent electronically to a different pharmacy, notify our office through [facility] or by phone at [phone] option 4.     Si Usted Necesita Algo Despus de Su Visita  Tambin puede enviarnos un mensaje a travs de Pharmacist, community. Por lo general respondemos a los mensajes de MyChart en el transcurso de 1 a 2 das hbiles.  Para renovar recetas, por favor pida a su farmacia que se ponga en contacto con nuestra oficina. Harland Dingwall de fax es [place] [phone].  Si tiene un asunto urgente cuando la clnica est cerrada y que no puede esperar hasta el siguiente da hbil, puede llamar/localizar a su doctor(a) al nmero que aparece a continuacin.   Por favor, tenga en cuenta que aunque hacemos todo lo posible para estar disponibles para asuntos urgentes fuera del horario de Agency, no estamos disponibles las 24 horas del da, los 7 das de la [place].   Si tiene un problema urgente y no puede comunicarse con nosotros, puede optar por buscar atencin mdica  en el consultorio de su doctor(a), en una clnica privada, en un centro de atencin urgente o en una sala de emergencias.  Si tiene Engineering geologist, por favor llame inmediatamente al 911 o vaya a la sala de emergencias.  Nmeros de bper  - Dr. Nehemiah Massed: [phone]  - Dra. Moye: [phone]  - Dra. Nicole Kindred: [phone]  En caso de inclemencias del [place], por favor llame a Johnsie Kindred principal al [phone] para una actualizacin sobre el [place] de cualquier retraso o cierre.  Consejos para la medicacin en dermatologa: Por favor, guarde las cajas en las que vienen los medicamentos de uso tpico para ayudarle a  seguir las instrucciones sobre dnde y cmo usarlos. Las farmacias generalmente imprimen las instrucciones del medicamento slo en las cajas y no directamente en los tubos del [place].   Si su medicamento es muy caro, por favor, pngase en contacto con Zigmund Daniel llamando al [phone] y presione la opcin 4 o envenos un mensaje a travs de Pharmacist, community.   No podemos decirle cul ser su copago por los medicamentos por adelantado ya que esto es diferente dependiendo de la cobertura de su seguro. Sin embargo, es posible que podamos encontrar un medicamento sustituto a Electrical engineer un formulario para que el seguro cubra el medicamento que se considera necesario.   Si se requiere una autorizacin previa para que su compaa de seguros [place] su medicamento, por favor permtanos de 1 a 2 das hbiles para completar este proceso.  Los precios de los medicamentos varan con frecuencia dependiendo del Environmental consultant de dnde se surte la receta y Environmental health practitioner pueden ofrecer  precios ms baratos.  El sitio web www.goodrx.com tiene cupones para medicamentos de Airline pilot. Los precios aqu no tienen en cuenta lo que podra costar con la ayuda del seguro (puede ser ms barato con su seguro), pero el sitio web puede darle el precio si no utiliz Research scientist (physical sciences).  - Puede imprimir el cupn correspondiente y llevarlo con su receta a la farmacia.  - Tambin puede pasar por nuestra oficina durante el horario de atencin regular y Charity fundraiser una tarjeta de cupones de GoodRx.  - Si necesita que su receta se enve electrnicamente a una farmacia diferente, informe a nuestra oficina a travs de MyChart de [place] o por telfono llamando al [phone] y presione la opcin 4.

## 2022-05-15 ENCOUNTER — Encounter: Payer: Self-pay | Admitting: Dermatology

## 2022-05-29 ENCOUNTER — Ambulatory Visit: Payer: Medicare PPO

## 2022-05-29 ENCOUNTER — Ambulatory Visit: Payer: Medicare PPO | Admitting: Nurse Practitioner

## 2022-05-29 ENCOUNTER — Ambulatory Visit (INDEPENDENT_AMBULATORY_CARE_PROVIDER_SITE_OTHER): Payer: Medicare PPO | Admitting: Nurse Practitioner

## 2022-05-29 ENCOUNTER — Encounter: Payer: Self-pay | Admitting: Nurse Practitioner

## 2022-05-29 VITALS — BP 140/80 | HR 67 | Temp 96.4°F | Resp 10 | Ht 62.0 in | Wt 122.2 lb

## 2022-05-29 DIAGNOSIS — Z170 Estrogen receptor positive status [ER+]: Secondary | ICD-10-CM

## 2022-05-29 DIAGNOSIS — I10 Essential (primary) hypertension: Secondary | ICD-10-CM

## 2022-05-29 DIAGNOSIS — Z0000 Encounter for general adult medical examination without abnormal findings: Secondary | ICD-10-CM | POA: Diagnosis not present

## 2022-05-29 DIAGNOSIS — T451X5A Adverse effect of antineoplastic and immunosuppressive drugs, initial encounter: Secondary | ICD-10-CM

## 2022-05-29 DIAGNOSIS — C50211 Malignant neoplasm of upper-inner quadrant of right female breast: Secondary | ICD-10-CM

## 2022-05-29 DIAGNOSIS — Z23 Encounter for immunization: Secondary | ICD-10-CM | POA: Diagnosis not present

## 2022-05-29 DIAGNOSIS — E7800 Pure hypercholesterolemia, unspecified: Secondary | ICD-10-CM | POA: Diagnosis not present

## 2022-05-29 DIAGNOSIS — G620 Drug-induced polyneuropathy: Secondary | ICD-10-CM

## 2022-05-29 LAB — LIPID PANEL
Cholesterol: 184 mg/dL (ref 0–200)
HDL: 83.7 mg/dL (ref >39.00)
LDL Cholesterol: 93 mg/dL (ref 0–99)
NonHDL: 99.94
Total CHOL/HDL Ratio: 2
Triglycerides: 37 mg/dL (ref 0.0–149.0)
VLDL: 7.4 mg/dL (ref 0.0–40.0)

## 2022-05-29 LAB — COMPREHENSIVE METABOLIC PANEL
ALT: 18 U/L (ref 0–35)
AST: 28 U/L (ref 0–37)
Albumin: 4.1 g/dL (ref 3.5–5.2)
Alkaline Phosphatase: 42 U/L (ref 39–117)
BUN: 11 mg/dL (ref 6–23)
CO2: 30 mEq/L (ref 19–32)
Calcium: 9.4 mg/dL (ref 8.4–10.5)
Chloride: 103 mEq/L (ref 96–112)
Creatinine, Ser: 0.8 mg/dL (ref 0.40–1.20)
GFR: 71.11 mL/min (ref >60.00)
Glucose, Bld: 96 mg/dL (ref 70–99)
Potassium: 4 mEq/L (ref 3.5–5.1)
Sodium: 140 mEq/L (ref 135–145)
Total Bilirubin: 0.6 mg/dL (ref 0.2–1.2)
Total Protein: 6.3 g/dL (ref 6.0–8.3)

## 2022-05-29 LAB — CBC
HCT: 37.4 % (ref 36.0–46.0)
Hemoglobin: 12.6 g/dL (ref 12.0–15.0)
MCHC: 33.6 g/dL (ref 30.0–36.0)
MCV: 89.9 fl (ref 78.0–100.0)
Platelets: 311 10*3/uL (ref 150.0–400.0)
RBC: 4.16 Mil/uL (ref 3.87–5.11)
RDW: 12.6 % (ref 11.5–15.5)
WBC: 2.5 10*3/uL — ABNORMAL LOW (ref 4.0–10.5)

## 2022-05-29 LAB — TSH: TSH: 0.97 u[IU]/mL (ref 0.35–5.50)

## 2022-05-29 MED ORDER — LISINOPRIL 10 MG PO TABS: 10.0000 mg | ORAL_TABLET | Freq: Every day | ORAL | 3 refills | Status: DC

## 2022-05-29 MED ORDER — GABAPENTIN 300 MG PO CAPS: 300.0000 mg | ORAL_CAPSULE | Freq: Three times a day (TID) | ORAL | 3 refills | Status: DC

## 2022-05-29 MED ORDER — AMLODIPINE BESYLATE 5 MG PO TABS: 5.0000 mg | ORAL_TABLET | Freq: Every day | ORAL | 3 refills | Status: DC

## 2022-05-29 NOTE — Assessment & Plan Note (Signed)
Patient's weight is within normal limits.  She is exercising daily.  Pending labs today.

## 2022-05-29 NOTE — Assessment & Plan Note (Signed)
Discussed age-appropriate immunizations and screening exams.  Patient was given paperwork at dismissal in clinic for preventative healthcare maintenance in her age range to offer some anticipatory guidance.

## 2022-05-29 NOTE — Assessment & Plan Note (Signed)
Patient currently on hormone therapy followed by oncology.  Oncology is managing patient's mammograms and DEXA scans.  Continue following up with oncology as recommended.

## 2022-05-29 NOTE — Progress Notes (Signed)
Established Patient Office Visit  Subjective   Patient ID: Sandra [name], female    DOB: 11-Dec-1945  Age: 76 y.o. MRN: [id number]  Chief Complaint  Patient presents with   Annual Exam    HPI  HTN: Does not check her blood pressure at home. Very infrequent blood pressure monitoring at home.  When she does blood pressures are WNL.    Carcinoma of right breast: every 6 months. Dr. Jacinto Reap.  Patient currently maintained on anastrozole  for complete physical and follow up of chronic conditions.  Immunizations: -Tetanus:2020 -Influenza: update today -Shingles: Completed first vaccine at pharmacy reviewed timeline for second vaccine. -Pneumonia: Up-to-date  -HPV: Aged out  Diet: [place]. States she will do a small breakfast and dinner. States she does snack in the evening. Water and half a glass of sweet tea with steva  Exercise: 3 miles a day around her neighborhood.  Eye exam: Completes annually. States she went in may. Wears glasses. [facility] Dental exam: Completes semi-annually   Pap Smear:  aged out.  Patient states no history of abnormal Paps.  States no history of ovarian, cervical, uterine cancers in the family. Mammogram: Completed in 07/20/2021.  Has one scheduled for 07/22/2022.  Colonoscopy: Completed in 2015, 10-year recall, due 2025 Lung Cancer Screening: N/A Dexa: Completed in completed in 09/27/2020.  Sleep: goes to bed around 11 and gets up aroun 7-715. Feels rested. Does not snore       Review of Systems  Constitutional:  Negative for chills and fever.  Respiratory:  Negative for shortness of breath.   Cardiovascular:  Negative for chest pain.  Gastrointestinal:  Negative for abdominal pain, blood in stool, constipation, diarrhea, nausea and vomiting.       BM daily  Genitourinary:  Negative for dysuria and hematuria.  Neurological:  Negative for dizziness and headaches.  Psychiatric/Behavioral:  Negative for  hallucinations and suicidal ideas.       Objective:     BP (!) 140/80   Pulse 67   Temp (!) 96.4 F (35.8 C) (Temporal)   Resp 10   Ht '5\' 2"'$  (1.575 m)   Wt 122 lb 4 oz (55.5 kg)   LMP  (LMP Unknown)   SpO2 100%   BMI 22.36 kg/m  BP Readings from Last 3 Encounters:  05/29/22 (!) 140/80  03/06/22 (!) 146/87  11/27/21 130/76   Wt Readings from Last 3 Encounters:  05/29/22 122 lb 4 oz (55.5 kg)  03/06/22 120 lb 9.6 oz (54.7 kg)  11/27/21 121 lb 11.2 oz (55.2 kg)      Physical Exam Vitals and nursing note reviewed.  Constitutional:      Appearance: Normal appearance.  HENT:     Right Ear: Tympanic membrane, ear canal and external ear normal.     Left Ear: Tympanic membrane, ear canal and external ear normal.     Mouth/Throat:     Mouth: Mucous membranes are moist.  Eyes:     Extraocular Movements: Extraocular movements intact.     Pupils: Pupils are equal, round, and reactive to light.  Cardiovascular:     Rate and Rhythm: Normal rate and regular rhythm.     Heart sounds: Normal heart sounds.  Pulmonary:     Effort: Pulmonary effort is normal.     Breath sounds: Normal breath sounds.  Abdominal:     General: Bowel sounds are normal. There is no distension.     Palpations: There is  no mass.     Tenderness: There is no abdominal tenderness.     Hernia: No hernia is present.  Musculoskeletal:     Right lower leg: No edema.     Left lower leg: No edema.  Lymphadenopathy:     Cervical: No cervical adenopathy.  Skin:    General: Skin is warm.  Neurological:     General: No focal deficit present.     Mental Status: She is alert.     Deep Tendon Reflexes:     Reflex Scores:      Bicep reflexes are 2+ on the right side and 2+ on the left side.      Patellar reflexes are 2+ on the right side and 2+ on the left side.    Comments: Bilateral upper and lower extremity strength 5/5      No results found for any visits on 05/29/22.    The 10-year ASCVD risk  score (Arnett DK, et al., 2019) is: 20.4%    Assessment & Plan:   Problem List Items Addressed This Visit       Cardiovascular and Mediastinum   Hypertension    Patient currently maintained on lisinopril 10 mg amlodipine 5 mg.  Patient blood pressure slightly elevated today even upon recheck.  Did encourage patient to check blood pressure once a week over the next month and report back.  Per JNC guidelines patient can have blood pressure of 150 over 90s and still waiting within guidelines.  Per home blood pressure readings can have closer follow-up and adjustment in antihypertensive medications.      Relevant Medications   amLODipine (NORVASC) 5 MG tablet   lisinopril (ZESTRIL) 10 MG tablet   Other Relevant Orders   CBC   Comprehensive metabolic panel     Nervous and Auditory   Neuropathy due to chemotherapeutic drug [facility])    Patient currently maintained on gabapentin.  Stable.        Other   Hyperlipidemia    Patient's weight is within normal limits.  She is exercising daily.  Pending labs today.      Relevant Medications   amLODipine (NORVASC) 5 MG tablet   lisinopril (ZESTRIL) 10 MG tablet   Carcinoma of upper-inner quadrant of right breast in female, estrogen receptor positive ([place])    Patient currently on hormone therapy followed by oncology.  Oncology is managing patient's mammograms and DEXA scans.  Continue following up with oncology as recommended.      Preventative health care - Primary    Discussed age-appropriate immunizations and screening exams.  Patient was given paperwork at dismissal in clinic for preventative healthcare maintenance in her age range to offer some anticipatory guidance.      Relevant Orders   CBC   Lipid panel   Comprehensive metabolic panel   TSH   Other Visit Diagnoses     Need for immunization against influenza       Relevant Orders   Flu Vaccine QUAD High Dose(Fluad) (Completed)   Chemotherapy-induced neuropathy ([place])        Relevant Medications   gabapentin (NEURONTIN) 300 MG capsule       Return in about 1 year (around 05/30/2023) for CPE.    Romilda Garret, NP

## 2022-05-29 NOTE — Patient Instructions (Signed)
Nice to see you today I want you to check your blood pressure once a week at home and send me the reading It is a little elevated in office today Follow up in 1 year for your next phyiscal, sooner if you need me

## 2022-05-29 NOTE — Assessment & Plan Note (Signed)
Patient currently maintained on lisinopril 10 mg amlodipine 5 mg.  Patient blood pressure slightly elevated today even upon recheck.  Did encourage patient to check blood pressure once a week over the next month and report back.  Per JNC guidelines patient can have blood pressure of 150 over 90s and still waiting within guidelines.  Per home blood pressure readings can have closer follow-up and adjustment in antihypertensive medications.

## 2022-05-29 NOTE — Assessment & Plan Note (Signed)
Patient currently maintained on gabapentin.  Stable.

## 2022-06-06 ENCOUNTER — Other Ambulatory Visit: Payer: Self-pay | Admitting: Internal Medicine

## 2022-06-06 DIAGNOSIS — Z170 Estrogen receptor positive status [ER+]: Secondary | ICD-10-CM

## 2022-06-06 DIAGNOSIS — Z95828 Presence of other vascular implants and grafts: Secondary | ICD-10-CM

## 2022-06-25 ENCOUNTER — Ambulatory Visit (INDEPENDENT_AMBULATORY_CARE_PROVIDER_SITE_OTHER): Payer: Medicare PPO

## 2022-06-25 VITALS — Ht 62.0 in | Wt 122.0 lb

## 2022-06-25 DIAGNOSIS — Z0000 Encounter for general adult medical examination without abnormal findings: Secondary | ICD-10-CM

## 2022-06-25 NOTE — Progress Notes (Signed)
Subjective:   Sandra [name] is a 76 y.o. female who presents for an Subsequent Medicare Annual Wellness Visit.  Review of Systems    No ROS.  Medicare Wellness Virtual Visit.  Visual/audio telehealth visit, UTA vital signs.   See social history for additional risk factors.   Cardiac Risk Factors include: advanced age (>72mn, >>41women)     Objective:    Today's Vitals   06/25/22 0948  Weight: 122 lb (55.3 kg)  Height: '5\' 2"'$  (1.575 m)   Body mass index is 22.31 kg/m.     06/25/2022    9:51 AM 03/06/2022   10:41 AM 09/05/2021   10:30 AM 03/07/2021   10:45 AM 09/13/2020   10:35 AM 03/13/2020   10:30 AM 09/10/2019    4:16 PM  Advanced Directives  Does Patient Have a Medical Advance Directive? Yes Yes Yes Yes Yes Yes Yes  Type of [place] Living will;Healthcare Power of Attorney Living will;Healthcare Power of Attorney Living will;Healthcare Power of Attorney Living will  Does patient want to make changes to medical advance directive? No - Patient declined     No - Patient declined No - Patient declined  Copy of [place] Chart? No - copy requested    No - copy requested No - copy requested   Would patient like information on creating a medical advance directive?     No - Patient declined No - Patient declined No - Patient declined    Current Medications (verified) Outpatient Encounter Medications as of 06/25/2022  Medication Sig   amLODipine (NORVASC) 5 MG tablet Take 1 tablet (5 mg total) by mouth daily.   anastrozole (ARIMIDEX) 1 MG tablet Take 1 tablet by mouth once daily   b complex vitamins tablet Take 1 tablet by mouth daily.   BIOTIN PO Take 5,000 mcg by mouth daily.   CALCIUM PO Take 1,000 mg by mouth.   COD LIVER OIL/VITAMINS A & D CAPS Take by mouth.   Flaxseed, Linseed, (FLAX SEEDS PO) Take by mouth daily.    gabapentin (NEURONTIN) 300 MG capsule Take 1 capsule (300 mg total) by mouth 3 (three) times daily.   Garlic 10347MG CAPS Take by mouth.   lisinopril (ZESTRIL) 10 MG tablet Take 1 tablet (10 mg total) by mouth daily.   Potassium 99 MG TABS Take 99 mg by mouth daily.    No facility-administered encounter medications on file as of 06/25/2022.    Allergies (verified) Cymbalta [duloxetine hcl] and Lovastatin   History: Past Medical History:  Diagnosis Date   [place]ast [place]   RIGHT lumpectomy 07/13/15, pT1c, pN0,M0 Stage 1 Er/PR pos, her 2 neg. 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, due to chemotherapy   Personal history of chemotherapy 2016   Personal history of radiation therapy 2016   Visual blurriness    SOMETHING NEW THAT HAS DEVELOPED SINCE [facility] (08-09-15)   Past Surgical History:  Procedure Laterality Date   ABDOMINAL HYSTERECTOMY  1987   BREAST BIOPSY Right    neg   BREAST BIOPSY Left    4 oclock, FIBROEPITHELIAL LESION FAVOR CELLULAR FIBROADENOMA   BREAST BIOPSY Left    2 oclock, CYSTIC APOCRINE METAPLASIA   BREAST  BIOPSY Right 07/13/15   130 INVASIVE MAMMARY CARCINOMA, NO SPECIAL TYPE   BREAST EXCISIONAL BIOPSY Left 2016   fibroadenoma   BREAST LUMPECTOMY Left 07/13/2015   BREAST LUMPECTOMY WITH SENTINEL LYMPH NODE BIOPSY Right 07/13/2015   Procedure: BREAST LUMPECTOMY WITH SENTINEL LYMPH NODE BX;  Surgeon: Christene Lye, MD;  Location: [facility] ORS;  Service: General;  Laterality: Right;   BUNIONECTOMY Right 1993   CATARACT EXTRACTION W/PHACO Left 09/17/2016   Procedure: CATARACT EXTRACTION PHACO AND INTRAOCULAR LENS PLACEMENT ([place]);  Surgeon: Birder Robson, MD;  Location: [facility] ORS;  Service: Ophthalmology;  Laterality: Left;  [place] 00:47AP% 17.6CDE 8.39Fluid pack lot # [id number]   CATARACT EXTRACTION W/PHACO Right 01/21/2017    Procedure: CATARACT EXTRACTION PHACO AND INTRAOCULAR LENS PLACEMENT (IOC);  Surgeon: Birder Robson, MD;  Location: [facility] ORS;  Service: Ophthalmology;  Laterality: Right;  [place] 00:39 AP% 13.0 CDE 5.16 fluid pack lot # [id number]   COLONOSCOPY  2015   fatty tumor Left 2013   side   PORT-A-CATH REMOVAL  2017   PORTACATH PLACEMENT Left 08/16/2015   Procedure: INSERTION PORT-A-CATH;  Surgeon: Christene Lye, MD;  Location: [facility] ORS;  Service: General;  Laterality: Left;   Family History  Problem Relation Age of Onset   Cancer Sister 49       breast cancer   Breast cancer Sister 15   Colon cancer Neg Hx    Social History   Socioeconomic History   Marital status: Married    Spouse name: Not on file   Number of children: Not on file   Years of education: Not on file   Highest education level: Not on file  Occupational History   Not on file  Tobacco Use   Smoking status: Never   Smokeless tobacco: Never  Vaping Use   Vaping Use: Never used  Substance and Sexual Activity   Alcohol use: No    Alcohol/week: 0.0 standard drinks of alcohol   Drug use: No   Sexual activity: Yes  Other Topics Concern   Not on file  Social History Narrative   End of life wishes (updated 01/2013)-   Daughter of wishes Systems developer)      Desires CPR.   Desires life support if reasonable.         Social Determinants of Health   Financial Resource Strain: Low Risk  (06/25/2022)   Overall Financial Resource Strain (CARDIA)    Difficulty of Paying Living Expenses: Not hard at all  Food Insecurity: No Food Insecurity (06/25/2022)   Hunger Vital Sign    Worried About Running Out of Food in the Last Year: Never true    Ran Out of Food in the Last Year: Never true  Transportation Needs: No Transportation Needs (06/25/2022)   PRAPARE - Hydrologist (Medical): No    Lack of Transportation (Non-Medical): No  Physical Activity: Not on file  Stress: No Stress  Concern Present (06/25/2022)   [place]    Feeling of Stress : Not at all  Social Connections: Unknown (06/25/2022)   Social Connection and Isolation Panel [NHANES]    Frequency of Communication with Friends and Family: More than three times a week    Frequency of Social Gatherings with Friends and Family: More than three times a week    Attends Religious Services: More than 4 times per year    Active Member of [employer] or Organizations: Not on file  Attends Archivist Meetings: Not on file    Marital Status: Married    Tobacco Counseling Counseling given: Not Answered   Clinical Intake:  Pre-visit preparation completed: Yes        Diabetes: No  How often do you need to have someone help you when you read instructions, pamphlets, or other written materials from your doctor or pharmacy?: 1 - Never    Interpreter Needed?: No      Activities of Daily Living    06/25/2022    9:55 AM  In your present state of health, do you have any difficulty performing the following activities:  Hearing? 0  Vision? 0  Difficulty concentrating or making decisions? 0  Walking or climbing stairs? 0  Dressing or bathing? 0  Doing errands, shopping? 0  Preparing Food and eating ? N  Using the Toilet? N  In the past six months, have you accidently leaked urine? N  Do you have problems with loss of bowel control? N  Managing your Medications? N  Managing your Finances? N  Housekeeping or managing your Housekeeping? N    Patient Care Team: Michela Pitcher, NP as PCP - General (Family Medicine) Glennie [place], PA-C (Physician Assistant) Pyrtle, Lajuan Lines, MD as Consulting Physician (Gastroenterology) Lucille Passy, MD (Inactive) as Consulting Physician (Family Medicine) Christene Lye, MD (General Surgery) Cammie Sickle, MD as Consulting Physician (Internal Medicine) Leona Singleton, RN as  Oncology Nurse Navigator Noreene Filbert, MD as Referring Physician (Radiation Oncology)  Indicate any recent Medical Services you may have received from other than [facility] providers in the past year (date may be approximate).     Assessment:   This is a routine wellness examination for Sandra [name].  I connected with  Sandra [name] on 06/25/22 by a audio enabled telemedicine application and verified that I am speaking with the correct person using two identifiers.  Patient Location: Home  Provider Location: Office/Clinic  I discussed the limitations of evaluation and management by telemedicine. The patient expressed understanding and agreed to proceed.   Hearing/Vision screen Hearing Screening - Comments:: Patient is able to hear conversational tones without difficulty.  No issues reported.   Vision Screening - Comments:: Had eye exam in may 2023 at [facility]  Dietary issues and exercise activities discussed: Current Exercise Habits: Home exercise routine, Type of exercise: walking, Time (Minutes): 45, Frequency (Times/Week): 5, Weekly Exercise (Minutes/Week): 225, Intensity: Moderate Eats a lot of seafood, no red meat/chicken/turkey/pork Good water intake   Goals Addressed             This Visit's Progress    Increase physical activity   On track    I will continue to exercise for at least 45 min 4-5 days per week.        Depression Screen    06/25/2022    9:54 AM 11/27/2021   10:31 AM 05/29/2021   10:30 AM 05/08/2020   10:52 AM 05/03/2019    2:23 PM 04/28/2018    9:25 AM 04/17/2017   10:12 AM  PHQ 2/9 Scores  PHQ - 2 Score 0 0 0 0 0 0 0  PHQ- 9 Score       0    Fall Risk    06/25/2022    9:55 AM 11/27/2021   10:31 AM 05/29/2021   10:35 AM 05/08/2020   10:53 AM 07/28/2019    9:11 AM  Fall Risk   Falls in the past  year? 0 0 0 0 0  Number falls in past yr: 0  0  0  Injury with Fall? 0  0  0  Follow up Falls evaluation completed Falls  evaluation completed       [place]: Home free of loose throw rugs in walkways, pet beds, electrical cords, etc? Yes  Adequate lighting in your home to reduce risk of falls? Yes   ASSISTIVE DEVICES UTILIZED TO PREVENT FALLS: Life alert? No  Use of a cane, walker or w/c? No   TIMED UP AND GO: Was the test performed? No .   Cognitive Function:    04/17/2017   10:20 AM  MMSE - Mini Mental State Exam  Orientation to time 5  Orientation to Place 5  Registration 3  Attention/ Calculation 0  Recall 2  Recall-comments unable to recall 1 of 3 words  Language- name 2 objects 0  Language- repeat 1  Language- follow 3 step command 3  Language- read & follow direction 0  Write a sentence 0  Copy design 0  Total score 19        06/25/2022   10:03 AM  6CIT Screen  What Year? 0 points  What month? 0 points  What time? 0 points  Count back from 20 0 points  Months in reverse 0 points  Repeat phrase 0 points  Total Score 0 points    Immunizations Immunization History  Administered Date(s) Administered   Fluad Quad(high Dose 65+) 05/24/2020, 05/11/2021, 05/29/2022   Influenza,inj,Quad PF,6+ Mos 04/06/2014, 04/24/2017, 04/28/2018, 05/03/2019   Influenza-Unspecified 04/15/2016   PFIZER(Purple Top)SARS-COV-2 Vaccination 08/19/2019, 09/09/2019, 05/02/2020, 12/18/2020   Pfizer Covid-19 Vaccine Bivalent Booster 58yr & up 07/02/2021   Pneumococcal Conjugate-13 04/06/2014   Pneumococcal Polysaccharide-23 01/15/2012   Td 08/15/2018   Zoster Recombinat (Shingrix) 05/14/2022   Zoster, Live 01/15/2012   Screening Tests Health Maintenance  Topic Date Due   COVID-19 Vaccine (6 - 2023-24 season) 05/21/2023 (Originally 04/05/2022)   Zoster Vaccines- Shingrix (2 of 2) 10/06/2024 (Originally 07/09/2022)   MAMMOGRAM  07/20/2022   Medicare Annual Wellness (AWV)  06/26/2023   Pneumonia Vaccine 76 Years old  Completed   INFLUENZA VACCINE  Completed   DEXA  SCAN  Completed   Hepatitis C Screening  Completed   HPV VACCINES  Aged Out   COLONOSCOPY (Pts [phone] coverage will need to be confirmed)  Discontinued   Health Maintenance There are no preventive care reminders to display for this patient.  Lung Cancer Screening: (Low Dose CT Chest recommended if Age 77-80ears, 30 pack-year currently smoking OR have quit w/in 15years.) does not qualify.   Hepatitis C Screening: Completed 2016.  Vision Screening: Recommended annual ophthalmology exams for early detection of glaucoma and other disorders of the eye.  Dental Screening: Recommended annual dental exams for proper oral hygiene  Community Resource Referral / Chronic Care Management: CRR required this visit?  No   CCM required this visit?  No      Plan:     I have personally reviewed and noted the following in the patient's chart:   Medical and social history Use of alcohol, tobacco or illicit drugs  Current medications and supplements including opioid prescriptions. Patient is not currently taking opioid prescriptions. Functional ability and status Nutritional status Physical activity Advanced directives List of other physicians Hospitalizations, surgeries, and ER visits in previous 12 months Vitals Screenings to include cognitive, depression, and falls Referrals and appointments  In addition, I have reviewed and discussed with patient certain preventive protocols, quality metrics, and best practice recommendations. A written personalized care plan for preventive services as well as general preventive health recommendations were provided to patient.     Leta Jungling, LPN   [id number]

## 2022-06-25 NOTE — Patient Instructions (Addendum)
Ms. Sandra [name] , Thank you for taking time to come for your Medicare Wellness Visit. I appreciate your ongoing commitment to your health goals. Please review the following plan we discussed and let me know if I can assist you in the future.   These are the goals we discussed:  Goals      Increase physical activity     I will continue to exercise for at least 45 min 4-5 days per week.         This is a list of the screening recommended for you and due dates:  Health Maintenance  Topic Date Due   COVID-19 Vaccine (6 - 2023-24 season) 05/21/2023*   Zoster (Shingles) Vaccine (2 of 2) 10/06/2024*   Mammogram  07/20/2022   Medicare Annual Wellness Visit  06/26/2023   Pneumonia Vaccine  Completed   Flu Shot  Completed   DEXA scan (bone density measurement)  Completed   Hepatitis C Screening: USPSTF Recommendation to screen - Ages 18-79 yo.  Completed   HPV Vaccine  Aged Out   Colon Cancer Screening  Discontinued  *Topic was postponed. The date shown is not the original due date.    Advanced directives: End of life planning; Advance aging; Advanced directives discussed.  Copy of current HCPOA/Living Will requested.    Conditions/risks identified: none new  Next appointment: Follow up in one year for your annual wellness visit    Preventive Care 65 Years and Older, Female Preventive care refers to lifestyle choices and visits with your health care provider that can promote health and wellness. What does preventive care include? A yearly physical exam. This is also called an annual well check. Dental exams once or twice a year. Routine eye exams. Ask your health care provider how often you should have your eyes checked. Personal lifestyle choices, including: Daily care of your teeth and gums. Regular physical activity. Eating a healthy diet. Avoiding tobacco and drug use. Limiting alcohol use. Practicing safe sex. Taking low-dose aspirin every day. Taking vitamin and mineral  supplements as recommended by your health care provider. What happens during an annual well check? The services and screenings done by your health care provider during your annual well check will depend on your age, overall health, lifestyle risk factors, and family history of disease. Counseling  Your health care provider may ask you questions about your: Alcohol use. Tobacco use. Drug use. Emotional well-being. Home and relationship well-being. Sexual activity. Eating habits. History of falls. Memory and ability to understand (cognition). Work and work Statistician. Reproductive health. Screening  You may have the following tests or measurements: Height, weight, and BMI. Blood pressure. Lipid and cholesterol levels. These may be checked every 5 years, or more frequently if you are over 71 years old. Skin check. Lung cancer screening. You may have this screening every year starting at age 59 if you have a 30-pack-year history of smoking and currently smoke or have quit within the past 15 years. Fecal occult blood test (FOBT) of the stool. You may have this test every year starting at age 3. Flexible sigmoidoscopy or colonoscopy. You may have a sigmoidoscopy every 5 years or a colonoscopy every 10 years starting at age 29. Hepatitis C blood test. Hepatitis B blood test. Sexually transmitted disease (STD) testing. Diabetes screening. This is done by checking your blood sugar (glucose) after you have not eaten for a while (fasting). You may have this done every 1-3 years. Bone density scan. This is done to screen  for osteoporosis. You may have this done starting at age 57. Mammogram. This may be done every 1-2 years. Talk to your health care provider about how often you should have regular mammograms. Talk with your health care provider about your test results, treatment options, and if necessary, the need for more tests. Vaccines  Your health care provider may recommend certain  vaccines, such as: Influenza vaccine. This is recommended every year. Tetanus, diphtheria, and acellular pertussis (Tdap, Td) vaccine. You may need a Td booster every 10 years. Zoster vaccine. You may need this after age 43. Pneumococcal 13-valent conjugate (PCV13) vaccine. One dose is recommended after age 34. Pneumococcal polysaccharide (PPSV23) vaccine. One dose is recommended after age 19. Talk to your health care provider about which screenings and vaccines you need and how often you need them. This information is not intended to replace advice given to you by your health care provider. Make sure you discuss any questions you have with your health care provider. Document Released: 08/18/2015 Document Revised: 04/10/2016 Document Reviewed: 05/23/2015 Elsevier Interactive Patient Education  2017 [employer] Prevention in the Home Falls can cause injuries. They can happen to people of all ages. There are many things you can do to make your home safe and to help prevent falls. What can I do on the outside of my home? Regularly fix the edges of walkways and driveways and fix any cracks. Remove anything that might make you trip as you walk through a door, such as a raised step or threshold. Trim any bushes or trees on the path to your home. Use bright outdoor lighting. Clear any walking paths of anything that might make someone trip, such as rocks or tools. Regularly check to see if handrails are loose or broken. Make sure that both sides of any steps have handrails. Any raised decks and porches should have guardrails on the edges. Have any leaves, snow, or ice cleared regularly. Use sand or salt on walking paths during winter. Clean up any spills in your garage right away. This includes oil or grease spills. What can I do in the bathroom? Use night lights. Install grab bars by the toilet and in the tub and shower. Do not use towel bars as grab bars. Use non-skid mats or decals in  the tub or shower. If you need to sit down in the shower, use a plastic, non-slip stool. Keep the floor dry. Clean up any water that spills on the floor as soon as it happens. Remove soap buildup in the tub or shower regularly. Attach bath mats securely with double-sided non-slip rug tape. Do not have throw rugs and other things on the floor that can make you trip. What can I do in the bedroom? Use night lights. Make sure that you have a light by your bed that is easy to reach. Do not use any sheets or blankets that are too big for your bed. They should not hang down onto the floor. Have a firm chair that has side arms. You can use this for support while you get dressed. Do not have throw rugs and other things on the floor that can make you trip. What can I do in the kitchen? Clean up any spills right away. Avoid walking on wet floors. Keep items that you use a lot in easy-to-reach places. If you need to reach something above you, use a strong step stool that has a grab bar. Keep electrical cords out of the way. Do  not use floor polish or wax that makes floors slippery. If you must use wax, use non-skid floor wax. Do not have throw rugs and other things on the floor that can make you trip. What can I do with my stairs? Do not leave any items on the stairs. Make sure that there are handrails on both sides of the stairs and use them. Fix handrails that are broken or loose. Make sure that handrails are as long as the stairways. Check any carpeting to make sure that it is firmly attached to the stairs. Fix any carpet that is loose or worn. Avoid having throw rugs at the top or bottom of the stairs. If you do have throw rugs, attach them to the floor with carpet tape. Make sure that you have a light switch at the top of the stairs and the bottom of the stairs. If you do not have them, ask someone to add them for you. What else can I do to help prevent falls? Wear shoes that: Do not have high  heels. Have rubber bottoms. Are comfortable and fit you well. Are closed at the toe. Do not wear sandals. If you use a stepladder: Make sure that it is fully opened. Do not climb a closed stepladder. Make sure that both sides of the stepladder are locked into place. Ask someone to hold it for you, if possible. Clearly mark and make sure that you can see: Any grab bars or handrails. First and last steps. Where the edge of each step is. Use tools that help you move around (mobility aids) if they are needed. These include: Canes. Walkers. Scooters. Crutches. Turn on the lights when you go into a dark area. Replace any light bulbs as soon as they burn out. Set up your furniture so you have a clear path. Avoid moving your furniture around. If any of your floors are uneven, fix them. If there are any pets around you, be aware of where they are. Review your medicines with your doctor. Some medicines can make you feel dizzy. This can increase your chance of falling. Ask your doctor what other things that you can do to help prevent falls. This information is not intended to replace advice given to you by your health care provider. Make sure you discuss any questions you have with your health care provider. Document Released: 05/18/2009 Document Revised: 12/28/2015 Document Reviewed: 08/26/2014 Elsevier Interactive Patient Education  2017 [employer].

## 2022-07-10 ENCOUNTER — Encounter: Payer: Self-pay | Admitting: Nurse Practitioner

## 2022-07-10 ENCOUNTER — Ambulatory Visit (INDEPENDENT_AMBULATORY_CARE_PROVIDER_SITE_OTHER)
Admission: RE | Admit: 2022-07-10 | Discharge: 2022-07-10 | Disposition: A | Discharge status: Home / Self Care | Payer: Medicare PPO | Source: Ambulatory Visit | Attending: Nurse Practitioner | Admitting: Nurse Practitioner

## 2022-07-10 ENCOUNTER — Ambulatory Visit: Payer: Medicare PPO | Admitting: Nurse Practitioner

## 2022-07-10 VITALS — BP 124/62 | HR 81 | Temp 97.3°F | Ht 62.0 in | Wt 119.0 lb

## 2022-07-10 DIAGNOSIS — R195 Other fecal abnormalities: Secondary | ICD-10-CM | POA: Insufficient documentation

## 2022-07-10 DIAGNOSIS — R140 Abdominal distension (gaseous): Secondary | ICD-10-CM

## 2022-07-10 DIAGNOSIS — R143 Flatulence: Secondary | ICD-10-CM | POA: Diagnosis not present

## 2022-07-10 LAB — COMPREHENSIVE METABOLIC PANEL
ALT: 13 U/L (ref 0–35)
AST: 22 U/L (ref 0–37)
Albumin: 4 g/dL (ref 3.5–5.2)
Alkaline Phosphatase: 48 U/L (ref 39–117)
BUN: 9 mg/dL (ref 6–23)
CO2: 30 mEq/L (ref 19–32)
Calcium: 9 mg/dL (ref 8.4–10.5)
Chloride: 101 mEq/L (ref 96–112)
Creatinine, Ser: 0.79 mg/dL (ref 0.40–1.20)
GFR: 72.14 mL/min (ref >60.00)
Glucose, Bld: 102 mg/dL — ABNORMAL HIGH (ref 70–99)
Potassium: 3.9 mEq/L (ref 3.5–5.1)
Sodium: 139 mEq/L (ref 135–145)
Total Bilirubin: 0.5 mg/dL (ref 0.2–1.2)
Total Protein: 6.7 g/dL (ref 6.0–8.3)

## 2022-07-10 LAB — CBC
HCT: 35.9 % — ABNORMAL LOW (ref 36.0–46.0)
Hemoglobin: 12.1 g/dL (ref 12.0–15.0)
MCHC: 33.7 g/dL (ref 30.0–36.0)
MCV: 89.1 fl (ref 78.0–100.0)
Platelets: 437 10*3/uL — ABNORMAL HIGH (ref 150.0–400.0)
RBC: 4.02 Mil/uL (ref 3.87–5.11)
RDW: 12.2 % (ref 11.5–15.5)
WBC: 3.6 10*3/uL — ABNORMAL LOW (ref 4.0–10.5)

## 2022-07-10 LAB — LIPASE: Lipase: 34 U/L (ref 11.0–59.0)

## 2022-07-10 NOTE — Assessment & Plan Note (Signed)
Pending will review abdomen and labs.

## 2022-07-10 NOTE — Assessment & Plan Note (Signed)
Increased in the amount of flatus that patient normally has.  Pending labs and x-ray can consider probiotic use

## 2022-07-10 NOTE — Patient Instructions (Signed)
Nice to see you today I will be in touch with the labs and xray once I have the results I have referred you to GI, they will reach out to you

## 2022-07-10 NOTE — Progress Notes (Signed)
   Acute Office Visit  Subjective:     Patient ID: Bleu Moisan, female    DOB: 1946-01-25, 76 y.o.   MRN: [id number]  Chief Complaint  Patient presents with   Acute Visit    Changes in stools x 1-2 weeks. States her stools are narrow, she has bloating in lower abd and had some diarrhea last week. Patient states there is no pain or bleeding.     HPI Patient is in today for Change in stools  States that she has noticed the stool more narrow and looser than usual, last week. States that last week she also had a few episodes of diarrhea. No blood in the stool  States that she did have some bloating that has resovled. Did not take any overt the counter  Patient did have a colonoscopy done in 2015 that had a 10-year recall.   Review of Systems  Constitutional:  Negative for chills and fever.  Gastrointestinal:  Positive for diarrhea. Negative for abdominal pain, blood in stool, melena, nausea and vomiting.  Genitourinary:  Negative for dysuria and hematuria.        Objective:    BP 124/62 (BP Location: Left Arm, Patient Position: Sitting, Cuff Size: Normal)   Pulse 81   Temp (!) 97.3 F (36.3 C) (Temporal)   Ht '5\' 2"'$  (1.575 m)   Wt 119 lb (54 kg)   LMP  (LMP Unknown)   SpO2 98%   BMI 21.77 kg/m    Physical Exam Constitutional:      Appearance: Normal appearance.  Cardiovascular:     Rate and Rhythm: Normal rate and regular rhythm.     Heart sounds: Normal heart sounds.  Pulmonary:     Effort: Pulmonary effort is normal.     Breath sounds: Normal breath sounds.  Abdominal:     General: Bowel sounds are normal. There is no distension.     Palpations: There is no mass.     Tenderness: There is no abdominal tenderness.     Hernia: No hernia is present.  Neurological:     Mental Status: She is alert.     No results found for any visits on 07/10/22.      Assessment & Plan:   Problem List Items Addressed This Visit       Other   Flatus     Increased in the amount of flatus that patient normally has.  Pending labs and x-ray can consider probiotic use      Bloating    Pending will review abdomen and labs.      Relevant Orders   CBC   Comprehensive metabolic panel   Lipase   Change in stool - Primary    Will do 1 view abdomen picture along with.  Ambulatory referral made to GI for further workup.      Relevant Orders   CBC   Comprehensive metabolic panel   Lipase   DG Abd 1 View   Ambulatory referral to Gastroenterology    No orders of the defined types were placed in this encounter.   Return if symptoms worsen or fail to improve.  Romilda Garret, NP

## 2022-07-10 NOTE — Assessment & Plan Note (Signed)
Will do 1 view abdomen picture along with.  Ambulatory referral made to GI for further workup.

## 2022-07-22 ENCOUNTER — Ambulatory Visit
Admission: RE | Admit: 2022-07-22 | Discharge: 2022-07-22 | Disposition: A | Discharge status: Home / Self Care | Payer: Medicare PPO | Source: Ambulatory Visit | Attending: Internal Medicine | Admitting: Internal Medicine

## 2022-07-22 DIAGNOSIS — C50211 Malignant neoplasm of upper-inner quadrant of right female breast: Secondary | ICD-10-CM | POA: Diagnosis not present

## 2022-07-22 DIAGNOSIS — Z170 Estrogen receptor positive status [ER+]: Secondary | ICD-10-CM | POA: Insufficient documentation

## 2022-07-22 DIAGNOSIS — Z1231 Encounter for screening mammogram for malignant neoplasm of breast: Secondary | ICD-10-CM | POA: Insufficient documentation

## 2022-08-07 ENCOUNTER — Telehealth: Payer: Self-pay | Admitting: *Deleted

## 2022-08-07 NOTE — Telephone Encounter (Signed)
Patient was transferred to my phone because patient mention colonoscopy. However, the referral is for change in stool. Patient will need a new patient appointment. Patient does not want to go to [place] because she does not travel that far any more and patient is 77 years old.

## 2022-09-02 DIAGNOSIS — H9313 Tinnitus, bilateral: Secondary | ICD-10-CM | POA: Diagnosis not present

## 2022-09-02 DIAGNOSIS — H6123 Impacted cerumen, bilateral: Secondary | ICD-10-CM | POA: Diagnosis not present

## 2022-09-10 ENCOUNTER — Inpatient Hospital Stay: Payer: Medicare PPO | Admitting: Internal Medicine

## 2022-09-10 ENCOUNTER — Telehealth: Payer: Self-pay | Admitting: *Deleted

## 2022-09-10 ENCOUNTER — Encounter: Payer: Self-pay | Admitting: Internal Medicine

## 2022-09-10 ENCOUNTER — Inpatient Hospital Stay: Payer: Medicare PPO | Attending: Internal Medicine

## 2022-09-10 VITALS — BP 137/85 | HR 73 | Temp 96.0°F | Resp 18 | Wt 119.7 lb

## 2022-09-10 DIAGNOSIS — Z79899 Other long term (current) drug therapy: Secondary | ICD-10-CM | POA: Diagnosis not present

## 2022-09-10 DIAGNOSIS — Z79811 Long term (current) use of aromatase inhibitors: Secondary | ICD-10-CM | POA: Insufficient documentation

## 2022-09-10 DIAGNOSIS — Z170 Estrogen receptor positive status [ER+]: Secondary | ICD-10-CM | POA: Diagnosis not present

## 2022-09-10 DIAGNOSIS — Z803 Family history of malignant neoplasm of breast: Secondary | ICD-10-CM | POA: Diagnosis not present

## 2022-09-10 DIAGNOSIS — M8580 Other specified disorders of bone density and structure, unspecified site: Secondary | ICD-10-CM | POA: Insufficient documentation

## 2022-09-10 DIAGNOSIS — D708 Other neutropenia: Secondary | ICD-10-CM | POA: Diagnosis not present

## 2022-09-10 DIAGNOSIS — C50211 Malignant neoplasm of upper-inner quadrant of right female breast: Secondary | ICD-10-CM

## 2022-09-10 DIAGNOSIS — Z95828 Presence of other vascular implants and grafts: Secondary | ICD-10-CM

## 2022-09-10 DIAGNOSIS — G629 Polyneuropathy, unspecified: Secondary | ICD-10-CM | POA: Insufficient documentation

## 2022-09-10 LAB — CBC WITH DIFFERENTIAL/PLATELET
Abs Immature Granulocytes: 0 10*3/uL (ref 0.00–0.07)
Basophils Absolute: 0 10*3/uL (ref 0.0–0.1)
Basophils Relative: 2 %
Eosinophils Absolute: 0.1 10*3/uL (ref 0.0–0.5)
Eosinophils Relative: 5 %
HCT: 39.4 % (ref 36.0–46.0)
Hemoglobin: 12.6 g/dL (ref 12.0–15.0)
Immature Granulocytes: 0 %
Lymphocytes Relative: 45 %
Lymphs Abs: 1 10*3/uL (ref 0.7–4.0)
MCH: 29.2 pg (ref 26.0–34.0)
MCHC: 32 g/dL (ref 30.0–36.0)
MCV: 91.4 fL (ref 80.0–100.0)
Monocytes Absolute: 0.2 10*3/uL (ref 0.1–1.0)
Monocytes Relative: 8 %
Neutro Abs: 0.9 10*3/uL — ABNORMAL LOW (ref 1.7–7.7)
Neutrophils Relative %: 40 %
Platelets: 275 10*3/uL (ref 150–400)
RBC: 4.31 MIL/uL (ref 3.87–5.11)
RDW: 12.6 % (ref 11.5–15.5)
WBC: 2.3 10*3/uL — ABNORMAL LOW (ref 4.0–10.5)
nRBC: 0 % (ref 0.0–0.2)

## 2022-09-10 LAB — COMPREHENSIVE METABOLIC PANEL
ALT: 19 U/L (ref 0–44)
AST: 28 U/L (ref 15–41)
Albumin: 3.9 g/dL (ref 3.5–5.0)
Alkaline Phosphatase: 49 U/L (ref 38–126)
Anion gap: 10 (ref 5–15)
BUN: 14 mg/dL (ref 8–23)
CO2: 26 mmol/L (ref 22–32)
Calcium: 9.1 mg/dL (ref 8.9–10.3)
Chloride: 104 mmol/L (ref 98–111)
Creatinine, Ser: 0.88 mg/dL (ref 0.44–1.00)
GFR, Estimated: >60 mL/min (ref >60)
Glucose, Bld: 111 mg/dL — ABNORMAL HIGH (ref 70–99)
Potassium: 3.6 mmol/L (ref 3.5–5.1)
Sodium: 140 mmol/L (ref 135–145)
Total Bilirubin: 0.7 mg/dL (ref 0.3–1.2)
Total Protein: 6.8 g/dL (ref 6.5–8.1)

## 2022-09-10 MED ORDER — ANASTROZOLE 1 MG PO TABS: 1.0000 mg | ORAL_TABLET | Freq: Every day | ORAL | 1 refills | Status: DC

## 2022-09-10 NOTE — Assessment & Plan Note (Signed)
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  Stable.  Tolerating very well; DEC 2023-Mammo-WNL.   # No clinical evidence of recurrence.  Continue anastrozole at this time.  Tolerating without any major side effects. Stable.   # Osteopenia- BMD- FEB 2022- T score= -.1.9 [2020: T score-1.9]; Continue calcium and vitamin D; Continue exercise/walking 3 miles/day- STABLE; will order BMD in 2024. Will order next visit.   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   # DISPOSITION:  # follow up in 6 months-MD/labs- cbc/cmp; -Dr.B.

## 2022-09-10 NOTE — Telephone Encounter (Signed)
Anastrazole refill

## 2022-09-10 NOTE — Progress Notes (Signed)
Taking anastrozole daily. No side effects. Energy is very good. No concerns today.

## 2022-09-10 NOTE — Progress Notes (Signed)
[place] @ [facility] Telephone:([phone]) [phone]  Fax:(336) [phone]   INITIAL CONSULT  Sandra [name] OB: 07-Oct-1945  MR#: [id number]  OEU#:[id number]  Patient Care Team: Michela Pitcher, NP as PCP - General (Family Medicine) Glennie Isle, PA-C (Physician Assistant) Pyrtle, Lajuan Lines, MD as Consulting Physician (Gastroenterology) Lucille Passy, MD (Inactive) as Consulting Physician (Family Medicine) Christene Lye, MD (General Surgery) Cammie Sickle, MD as Consulting Physician (Internal Medicine) Leona Singleton, RN as Oncology Nurse Navigator Noreene Filbert, MD as Referring Physician (Radiation Oncology)  CHIEF COMPLAINT:  Chief Complaint  Patient presents with   Follow-up    Breast Cancer   VISIT DIAGNOSIS:     ICD-10-CM   1. Carcinoma of upper-inner quadrant of right breast in female, estrogen receptor positive ([place])  C50.211 CBC with Differential   Z17.0 Comprehensive metabolic panel       Oncology History Overview Note   carcinoma breast (right) inner and upper quadrant T1 cN0 M0 tumor Estrogen receptor positive progesterone receptor positive HER-2 receptor negative by fish Mammo print shows high risk (diagnosis in January of 2016) patient has 22% average risk in 5 years and 29% average risk in 10 years for distant metastectomy disease without chemotherapy on anti-hormonal treatment 2.  Patient started Cytoxan and Adriamycin on now August 30, 2015 MUGA scan shows ejection fraction of baseline 55%.  3.Patient has finished total of 4 cycles of chemotherapy on November 01, 2015 4.  Started on the Taxol from November 22, 2015; FINISHED RT [sep 2017] # OCT 2017- START ARIMIDEX   # NOV 2021 BMD- T score= - 1.9 -OSTEOPENIA- ca+vit D  # SURVIVORSHIP: O  --------------------------------------------------------   DIAGNOSIS: RIGHT BREAST CA  STAGE: I        ;GOALS: cure  CURRENT/MOST RECENT THERAPY: on anastrazole      Carcinoma of upper-inner  quadrant of right breast in female, estrogen receptor positive ([place])    INTERVAL HISTORY: Alone.  Ambulating independently.  77 year old lady History of stage I right-sided breast cancer ER/PR positive currently on Arimidex is here for follow-up.  Taking anastrozole daily. No side effects. Energy is very good. No concerns today.    Patient denies any worsening joint pains or bone pain.  Patient has intermittent cramps in the legs.  Otherwise denies any worsening tingling or numbness.  She continues with gabapentin.   Review of Systems  Constitutional:  Negative for chills, diaphoresis, fever, malaise/fatigue and weight loss.  HENT:  Negative for nosebleeds and sore throat.   Eyes:  Negative for double vision.  Respiratory:  Negative for cough, hemoptysis, sputum production, shortness of breath and wheezing.   Cardiovascular:  Negative for chest pain, palpitations, orthopnea and leg swelling.  Gastrointestinal:  Negative for abdominal pain, blood in stool, constipation, diarrhea, heartburn, melena, nausea and vomiting.  Genitourinary:  Negative for dysuria, frequency and urgency.  Musculoskeletal:  Negative for back pain and joint pain.  Skin: Negative.  Negative for itching and rash.  Neurological:  Positive for tingling. Negative for dizziness, focal weakness, weakness and headaches.  Endo/Heme/Allergies:  Does not bruise/bleed easily.  Psychiatric/Behavioral:  Negative for depression. The patient is not nervous/anxious and does not have insomnia.      PAST MEDICAL HISTORY: Past Medical History:  Diagnosis Date   Breast cancer ([place]) 07/13/2015   RIGHT lumpectomy 07/13/15, pT1c, pN0,M0 Stage 1 Er/PR pos, her 2 neg. MammaPrint High   Hyperlipidemia    Hypertension    Neuropathy    Neuropathy  2018   toes and fingers, due to chemotherapy   Personal history of chemotherapy 2016   Personal history of radiation therapy 2016   Visual blurriness    SOMETHING NEW THAT HAS DEVELOPED  SINCE 07-13-15 SURGERY- SEEN AT [facility] ENT AND EXAM WAS NORMAL PER PT (08-09-15)    PAST SURGICAL HISTORY: Past Surgical History:  Procedure Laterality Date   ABDOMINAL HYSTERECTOMY  1987   BREAST BIOPSY Right    neg   BREAST BIOPSY Left    4 oclock, FIBROEPITHELIAL LESION FAVOR CELLULAR FIBROADENOMA   BREAST BIOPSY Left    2 oclock, CYSTIC APOCRINE METAPLASIA   BREAST BIOPSY Right 07/13/15   130 INVASIVE MAMMARY CARCINOMA, NO SPECIAL TYPE   BREAST EXCISIONAL BIOPSY Left 2016   fibroadenoma   BREAST LUMPECTOMY Left 07/13/2015   BREAST LUMPECTOMY WITH SENTINEL LYMPH NODE BIOPSY Right 07/13/2015   Procedure: BREAST LUMPECTOMY WITH SENTINEL LYMPH NODE BX;  Surgeon: Christene Lye, MD;  Location: [facility] ORS;  Service: General;  Laterality: Right;   BUNIONECTOMY Right 1993   CATARACT EXTRACTION W/PHACO Left 09/17/2016   Procedure: CATARACT EXTRACTION PHACO AND INTRAOCULAR LENS PLACEMENT ([place]);  Surgeon: Birder Robson, MD;  Location: [facility] ORS;  Service: Ophthalmology;  Laterality: Left;  [place] 00:47AP% 17.6CDE 8.39Fluid pack lot # [id number]   CATARACT EXTRACTION W/PHACO Right 01/21/2017   Procedure: CATARACT EXTRACTION PHACO AND INTRAOCULAR LENS PLACEMENT (IOC);  Surgeon: Birder Robson, MD;  Location: [facility] ORS;  Service: Ophthalmology;  Laterality: Right;  [place] 00:39 AP% 13.0 CDE 5.16 fluid pack lot # [id number]   COLONOSCOPY  2015   fatty tumor Left 2013   side   PORT-A-CATH REMOVAL  2017   PORTACATH PLACEMENT Left 08/16/2015   Procedure: INSERTION PORT-A-CATH;  Surgeon: Christene Lye, MD;  Location: [facility] ORS;  Service: General;  Laterality: Left;    FAMILY HISTORY Family History  Problem Relation Age of Onset   Cancer Sister 68       breast cancer   Breast cancer Sister 5   Colon cancer Neg Hx         ADVANCED DIRECTIVES:   Patient does not have any living will or healthcare power of attorney.  Information was given .  Available resources had been discussed.    HEALTH MAINTENANCE: Social History   Tobacco Use   Smoking status: Never   Smokeless tobacco: Never  Vaping Use   Vaping Use: Never used  Substance Use Topics   Alcohol use: No    Alcohol/week: 0.0 standard drinks of alcohol   Drug use: No      :  Allergies  Allergen Reactions   Cymbalta [Duloxetine Hcl] Other (See Comments)    Glaucoma.  Unable to take   Lovastatin Anaphylaxis    Tongue swelling    Current Outpatient Medications  Medication Sig Dispense Refill   amLODipine (NORVASC) 5 MG tablet Take 1 tablet (5 mg total) by mouth daily. 90 tablet 3   b complex vitamins tablet Take 1 tablet by mouth daily.     BIOTIN PO Take 5,000 mcg by mouth daily.     CALCIUM PO Take 1,000 mg by mouth.     COD LIVER OIL/VITAMINS A & D CAPS Take by mouth.     Flaxseed, Linseed, (FLAX SEEDS PO) Take by mouth daily.     gabapentin (NEURONTIN) 300 MG capsule Take 1 capsule (300 mg total) by mouth 3 (three) times daily. 803 capsule 3   Garlic 2122 MG CAPS Take by  mouth.     lisinopril (ZESTRIL) 10 MG tablet Take 1 tablet (10 mg total) by mouth daily. 90 tablet 3   polyethylene glycol powder (GLYCOLAX/MIRALAX) 17 GM/SCOOP powder Take 1 Container by mouth daily.     Potassium 99 MG TABS Take 99 mg by mouth daily.      anastrozole (ARIMIDEX) 1 MG tablet Take 1 tablet (1 mg total) by mouth daily. 90 tablet 1   No current facility-administered medications for this visit.   Right and left BREAST exam [in the presence of nurse]- no unusual skin changes or dominant masses felt. Surgical scars noted.    OBJECTIVE: Physical Exam HENT:     Head: Normocephalic and atraumatic.     Mouth/Throat:     Pharynx: No oropharyngeal exudate.  Eyes:     Pupils: Pupils are equal, round, and reactive to light.  Cardiovascular:     Rate and Rhythm: Normal rate and regular rhythm.  Pulmonary:     Effort: No respiratory distress.     Breath sounds: No wheezing.  Abdominal:     General: Bowel sounds are  normal. There is no distension.     Palpations: Abdomen is soft. There is no mass.     Tenderness: There is no abdominal tenderness. There is no guarding or rebound.  Musculoskeletal:        General: No tenderness. Normal range of motion.     Cervical back: Normal range of motion and neck supple.  Skin:    General: Skin is warm.  Neurological:     Mental Status: She is alert and oriented to person, place, and time.  Psychiatric:        Mood and Affect: Affect normal.       Vitals:   09/10/22 1013  BP: 137/85  Pulse: 73  Resp: 18  Temp: (!) 96 F (35.6 C)  SpO2: 100%     Body mass index is 21.89 kg/m.    ECOG FS:0 - Asymptomatic  LAB RESULTS:  Appointment on 09/10/2022  Component Date Value Ref Range Status   Sodium 09/10/2022 140  135 - 145 mmol/L Final   Potassium 09/10/2022 3.6  3.5 - 5.1 mmol/L Final   Chloride 09/10/2022 104  98 - 111 mmol/L Final   CO2 09/10/2022 26  22 - 32 mmol/L Final   Glucose, Bld 09/10/2022 111 (H)  70 - 99 mg/dL Final   Glucose reference range applies only to samples taken after fasting for at least 8 hours.   BUN 09/10/2022 14  8 - 23 mg/dL Final   Creatinine, Ser 09/10/2022 0.88  0.44 - 1.00 mg/dL Final   Calcium 09/10/2022 9.1  8.9 - 10.3 mg/dL Final   Total Protein 09/10/2022 6.8  6.5 - 8.1 g/dL Final   Albumin 09/10/2022 3.9  3.5 - 5.0 g/dL Final   AST 09/10/2022 28  15 - 41 U/L Final   ALT 09/10/2022 19  0 - 44 U/L Final   Alkaline Phosphatase 09/10/2022 49  38 - 126 U/L Final   Total Bilirubin 09/10/2022 0.7  0.3 - 1.2 mg/dL Final   GFR, Estimated 09/10/2022 >60  >60 mL/min Final   Comment: (NOTE) Calculated using the CKD-EPI Creatinine Equation (2021)    Anion gap 09/10/2022 10  5 - 15 Final   Performed at [facility], [place], [place] [address]   WBC 09/10/2022 2.3 (L)  4.0 - 10.5 K/uL Final   RBC 09/10/2022 4.31  3.87 - 5.11  MIL/uL Final   Hemoglobin 09/10/2022 12.6  12.0 - 15.0 g/dL Final   HCT  09/10/2022 39.4  36.0 - 46.0 % Final   MCV 09/10/2022 91.4  80.0 - 100.0 fL Final   MCH 09/10/2022 29.2  26.0 - 34.0 pg Final   MCHC 09/10/2022 32.0  30.0 - 36.0 g/dL Final   RDW 09/10/2022 12.6  11.5 - 15.5 % Final   Platelets 09/10/2022 275  150 - 400 K/uL Final   nRBC 09/10/2022 0.0  0.0 - 0.2 % Final   Neutrophils Relative % 09/10/2022 40  % Final   Neutro Abs 09/10/2022 0.9 (L)  1.7 - 7.7 K/uL Final   Lymphocytes Relative 09/10/2022 45  % Final   Lymphs Abs 09/10/2022 1.0  0.7 - 4.0 K/uL Final   Monocytes Relative 09/10/2022 8  % Final   Monocytes Absolute 09/10/2022 0.2  0.1 - 1.0 K/uL Final   Eosinophils Relative 09/10/2022 5  % Final   Eosinophils Absolute 09/10/2022 0.1  0.0 - 0.5 K/uL Final   Basophils Relative 09/10/2022 2  % Final   Basophils Absolute 09/10/2022 0.0  0.0 - 0.1 K/uL Final   Immature Granulocytes 09/10/2022 0  % Final   Abs Immature Granulocytes 09/10/2022 0.00  0.00 - 0.07 K/uL Final   Performed at [facility], [address] [place], [place] [address]     STUDIES: No results found.  ASSESSMENT:   Carcinoma of upper-inner quadrant of right breast in female, estrogen receptor positive ([place]) # Stage I ER/PR positive HER-2/neu negative;  On Anastrazole on extended AI [until 2027].  Stable.  Tolerating very well; DEC 2023-Mammo-WNL.   # No clinical evidence of recurrence.  Continue anastrozole at this time.  Tolerating without any major side effects. Stable.   # Osteopenia- BMD- FEB 2022- T score= -.1.9 [2020: T score-1.9]; Continue calcium and vitamin D; Continue exercise/walking 3 miles/day- STABLE; will order BMD in 2024. Will order next visit.   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   # DISPOSITION:  # follow up in 6 months-MD/labs- cbc/cmp; -Dr.B.   No matching staging information was found for the patient.  Cammie Sickle, MD   09/10/2022  10:57 AM

## 2022-11-20 ENCOUNTER — Encounter: Payer: Self-pay | Admitting: Gastroenterology

## 2022-11-20 ENCOUNTER — Ambulatory Visit: Payer: Medicare PPO | Admitting: Gastroenterology

## 2022-11-20 ENCOUNTER — Ambulatory Visit: Payer: Medicare PPO | Admitting: Dermatology

## 2022-11-20 VITALS — BP 154/84 | HR 71 | Temp 98.0°F | Ht 62.0 in | Wt 120.5 lb

## 2022-11-20 VITALS — BP 150/78

## 2022-11-20 DIAGNOSIS — L811 Chloasma: Secondary | ICD-10-CM

## 2022-11-20 DIAGNOSIS — Z79899 Other long term (current) drug therapy: Secondary | ICD-10-CM | POA: Diagnosis not present

## 2022-11-20 DIAGNOSIS — R194 Change in bowel habit: Secondary | ICD-10-CM

## 2022-11-20 NOTE — Progress Notes (Signed)
   Follow-Up Visit   Subjective  Sandra [name] is a 77 y.o. female who presents for the following: 6 month follow up for Melasma of face - she is not currently using anything. She had used [facility] cream in the past   The following portions of the chart were reviewed this encounter and updated as appropriate: medications, allergies, medical history  Review of Systems:  No other skin or systemic complaints except as noted in HPI or Assessment and Plan.  Objective  Well appearing patient in no apparent distress; mood and affect are within normal limits.  A focused examination was performed of the following areas: Face  Relevant exam findings are noted in the Assessment and Plan.    Assessment & Plan   MELASMA Exam: reticulated hyperpigmented patches at face        Chronic and persistent condition with duration or expected duration over one year. Condition is symptomatic/ bothersome to patient. Not currently at goal.   Melasma is a chronic; persistent condition of hyperpigmented patches generally on the face, worse in summer due to higher UV exposure.    Heredity; thyroid disease; sun exposure; pregnancy; birth control pills; epilepsy medication and darker skin may predispose to Melasma.   Recommendations include: - Sun avoidance and daily broad spectrum (UVA/UVB) tinted mineral sunscreen SPF 30+, with Zinc or Titanium Dioxide. - Rx topical bleaching creams (i.e. hydroquinone) is a common treatment but should not be used long term.  Hydroquinones may be mixed with retinoids; vitamin C; steroids; Kojic Acid. - Alastin A-luminate, retinoids, vitamin C, topical tranexamic acid, glycolic acid and kojic acid can be used for brightening while on break from hydroquinone - Rx Azelaic Acid is also a treatment option that is safe for pregnancy (Category B). - OTC Heliocare can be helpful in control and prevention. - Oral Rx with Tranexamic Acid 250 mg - 650 mg po daily  can be used for moderate to severe cases especially during summer (contraindications include pregnancy; lactation; hx of PE; hx of DVT; clotting disorder; heart disease; anticoagulant use and upcoming long trips)   - Chemical peels (would need multiple for best result).  - Lasers and  Microdermabrasion may also be helpful adjunct treatments.  Treatment Plan: Will prescribe [facility] Hydroquinone 12%/kojic acid/vitamin C cream pea sized amount twice daily to the entire face for up to 3 months. This cannot be used more than 3 months due to risk of exogenous ochronosis (permanent dark spots). The patient was advised this is not covered by insurance since it is made by a compounding pharmacy. They will receive an email to check out and the medication will be mailed to their home.       Return for 6-8 months.  I, Joanie Coddington, CMA, am acting as scribe for Armida Sans, MD .   Documentation: I have reviewed the above documentation for accuracy and completeness, and I agree with the above.  Armida Sans, MD

## 2022-11-20 NOTE — Progress Notes (Signed)
Arlyss Repress, MD [address]  [address]  [place], [place] [address]  Main: [phone]  Fax: [phone]    Gastroenterology Consultation  Referring Provider:     Eden Emms, NP Primary Care Physician:  Eden Emms, NP Primary Gastroenterologist:  Dr. Arlyss Repress Reason for Consultation:   Change in bowel habits        HPI:   Sandra [name] is a 77 y.o. female referred by Eden Emms, NP  for consultation & management of change in bowel habits.  Patient was evaluated by her PCP in December 2023 for lower abdominal bloating, soft stools up to 4 times daily.  She underwent x-ray KUB which revealed moderate to large amount of stool throughout the colon, she was told to take MiraLAX.  Patient reports that she has been on hormonal therapy for breast cancer, she has been suffering from constipation, was taking over-the-counter stool softener 3 pills daily.  She denies any rectal bleeding, weight loss or loss of appetite.  No evidence of anemia  NSAIDs: None  Antiplts/Anticoagulants/Anti thrombotics: None  GI Procedures:  Colonoscopy in 2015 for screening, excellent prep Normal  Past Medical History:  Diagnosis Date   Breast cancer 07/13/2015   RIGHT lumpectomy 07/13/15, pT1c, pN0,M0 Stage 1 Er/PR pos, her 2 neg. 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, due to chemotherapy   Personal history of chemotherapy 2016   Personal history of radiation therapy 2016   Visual blurriness    SOMETHING NEW THAT HAS DEVELOPED SINCE 07-13-15 SURGERY- SEEN AT [facility] ENT AND EXAM WAS NORMAL PER PT (08-09-15)    Past Surgical History:  Procedure Laterality Date   ABDOMINAL HYSTERECTOMY  1987   BREAST BIOPSY Right    neg   BREAST BIOPSY Left    4 oclock, FIBROEPITHELIAL LESION FAVOR CELLULAR FIBROADENOMA   BREAST BIOPSY Left    2 oclock, CYSTIC APOCRINE METAPLASIA   BREAST BIOPSY Right 07/13/15    130 INVASIVE MAMMARY CARCINOMA, NO SPECIAL TYPE   BREAST EXCISIONAL BIOPSY Left 2016   fibroadenoma   BREAST LUMPECTOMY Left 07/13/2015   BREAST LUMPECTOMY WITH SENTINEL LYMPH NODE BIOPSY Right 07/13/2015   Procedure: BREAST LUMPECTOMY WITH SENTINEL LYMPH NODE BX;  Surgeon: Kieth Brightly, MD;  Location: [facility] ORS;  Service: General;  Laterality: Right;   BUNIONECTOMY Right 1993   CATARACT EXTRACTION W/PHACO Left 09/17/2016   Procedure: CATARACT EXTRACTION PHACO AND INTRAOCULAR LENS PLACEMENT (IOC);  Surgeon: Galen Manila, MD;  Location: [facility] ORS;  Service: Ophthalmology;  Laterality: Left;  [place] 00:47AP% 17.6CDE 8.39Fluid pack lot # [id number]   CATARACT EXTRACTION W/PHACO Right 01/21/2017   Procedure: CATARACT EXTRACTION PHACO AND INTRAOCULAR LENS PLACEMENT (IOC);  Surgeon: Galen Manila, MD;  Location: [facility] ORS;  Service: Ophthalmology;  Laterality: Right;  [place] 00:39 AP% 13.0 CDE 5.16 fluid pack lot # [id number]   COLONOSCOPY  2015   fatty tumor Left 2013   side   PORT-A-CATH REMOVAL  2017   PORTACATH PLACEMENT Left 08/16/2015   Procedure: INSERTION PORT-A-CATH;  Surgeon: Kieth Brightly, MD;  Location: [facility] ORS;  Service: General;  Laterality: Left;     Current Outpatient Medications:    amLODipine (NORVASC) 5 MG tablet, Take 1 tablet (5 mg total) by mouth daily., Disp: 90 tablet, Rfl: 3   anastrozole (ARIMIDEX) 1 MG tablet, Take 1 tablet (1 mg total) by  mouth daily., Disp: 90 tablet, Rfl: 1   b complex vitamins tablet, Take 1 tablet by mouth daily., Disp: , Rfl:    BIOTIN PO, Take 5,000 mcg by mouth daily., Disp: , Rfl:    CALCIUM PO, Take 1,000 mg by mouth., Disp: , Rfl:    COD LIVER OIL/VITAMINS A & D CAPS, Take by mouth., Disp: , Rfl:    Flaxseed, Linseed, (FLAX SEEDS PO), Take by mouth daily., Disp: , Rfl:    gabapentin (NEURONTIN) 300 MG capsule, Take 1 capsule (300 mg total) by mouth 3 (three) times daily., Disp: 270 capsule, Rfl: 3   Garlic 1000 MG CAPS, Take by  mouth., Disp: , Rfl:    lisinopril (ZESTRIL) 10 MG tablet, Take 1 tablet (10 mg total) by mouth daily., Disp: 90 tablet, Rfl: 3   polyethylene glycol powder (GLYCOLAX/MIRALAX) 17 GM/SCOOP powder, Take 1 Container by mouth daily., Disp: , Rfl:    Potassium 99 MG TABS, Take 99 mg by mouth daily. , Disp: , Rfl:    Family History  Problem Relation Age of Onset   Cancer Sister 26       breast cancer   Breast cancer Sister 32   Colon cancer Neg Hx      Social History   Tobacco Use   Smoking status: Never   Smokeless tobacco: Never  Vaping Use   Vaping Use: Never used  Substance Use Topics   Alcohol use: No    Alcohol/week: 0.0 standard drinks of alcohol   Drug use: No    Allergies as of 11/20/2022 - Review Complete 11/20/2022  Allergen Reaction Noted   Cymbalta [duloxetine hcl] Other (See Comments) 02/27/2016   Lovastatin Anaphylaxis 01/15/2012    Review of Systems:    All systems reviewed and negative except where noted in HPI.   Physical Exam:  BP (!) 154/84 (BP Location: Left Arm, Patient Position: Sitting, Cuff Size: Normal)   Pulse 71   Temp 98 F (36.7 C) (Oral)   Ht  (1.575 m)   Wt 120 lb 8 oz (54.7 kg)   LMP  (LMP Unknown)   BMI 22.04 kg/m  No LMP recorded (lmp unknown). Patient has had a hysterectomy.  General:   Alert,  Well-developed, well-nourished, pleasant and cooperative in NAD Head:  Normocephalic and atraumatic. Eyes:  Sclera clear, no icterus.   Conjunctiva pink. Ears:  Normal auditory acuity. Nose:  No deformity, discharge, or lesions. Mouth:  No deformity or lesions,oropharynx pink & moist. Neck:  Supple; no masses or thyromegaly. Lungs:  Respirations even and unlabored.  Clear throughout to auscultation.   No wheezes, crackles, or rhonchi. No acute distress. Heart:  Regular rate and rhythm; no murmurs, clicks, rubs, or gallops. Abdomen:  Normal bowel sounds. Soft, non-tender and non-distended without masses, hepatosplenomegaly or hernias  noted.  No guarding or rebound tenderness.   Rectal: Not performed Msk:  Symmetrical without gross deformities. Good, equal movement & strength bilaterally. Pulses:  Normal pulses noted. Extremities:  No clubbing or edema.  No cyanosis. Neurologic:  Alert and oriented x3;  grossly normal neurologically. Skin:  Intact without significant lesions or rashes. No jaundice. Psych:  Alert and cooperative. Normal mood and affect.  Imaging Studies: Reviewed  Assessment and Plan:   Sandra [name] is a 77 y.o. female with history of ER/PR positive breast cancer currently on Arimidex, as well as gabapentin is seen in consultation for change in bowel habits.  Colonoscopy in 2015 was normal  Change  in bowel habits, more so of soft bowel movements 4 times daily Advised patient to stop the stool softener and see if it helps to improve the consistency.  She will call our office in 2 weeks with an update.  If her symptoms are persistent, will schedule colonoscopy.  Otherwise, she will need screening colonoscopy in 2025   Follow up as needed   Arlyss Repress, MD

## 2022-11-20 NOTE — Patient Instructions (Addendum)
Instructions for [facility] Medications  One or more of your medications was sent to the [facility] mail order compounding pharmacy. You will receive an email from them and can purchase the medicine through that link. It will then be mailed to your home at the address you confirmed. If for any reason you do not receive an email from them, please check your spam folder. If you still do not find the email, please let us know. [facility] phone number is [phone].   Apply daily to face for 3 months then stop for 3 months.

## 2022-12-02 ENCOUNTER — Encounter: Payer: Self-pay | Admitting: Dermatology

## 2022-12-03 ENCOUNTER — Telehealth: Payer: Self-pay

## 2022-12-03 ENCOUNTER — Other Ambulatory Visit: Payer: Self-pay

## 2022-12-03 DIAGNOSIS — R194 Change in bowel habit: Secondary | ICD-10-CM

## 2022-12-03 MED ORDER — NA SULFATE-K SULFATE-MG SULF 17.5-3.13-1.6 GM/177ML PO SOLN: 354.0000 mL | Freq: Once | ORAL | 0 refills | Status: AC

## 2022-12-03 NOTE — Telephone Encounter (Signed)
Pt called to let Dr. Allegra Lai know she is ready to schedule her colonoscopy. Pt was seen in clinic on April 17th. Please call to schedule

## 2022-12-03 NOTE — Telephone Encounter (Signed)
Called and left a message for call back  

## 2022-12-03 NOTE — Telephone Encounter (Signed)
Called patient and patient states she is having the same symptoms she was having at the visit. Scheduled patient for 12/18/2022 went over instructions, sent to [facility] and mailed them. Sent prep to the pharmacy

## 2022-12-11 ENCOUNTER — Encounter: Payer: Self-pay | Admitting: Gastroenterology

## 2022-12-17 ENCOUNTER — Encounter: Payer: Self-pay | Admitting: Gastroenterology

## 2022-12-17 NOTE — Anesthesia Preprocedure Evaluation (Signed)
Anesthesia Evaluation  Patient identified by MRN, date of birth, ID band Patient awake    Reviewed: Allergy & Precautions, H&P , NPO status , Patient's Chart, lab work & pertinent test results  Airway Mallampati: II  TM Distance: >3 FB Neck ROM: full    Dental no notable dental hx.    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 MammaPrint High No date: Hyperlipidemia No date: Hypertension No date: Neuropathy 2018: Neuropathy     Comment:  toes and fingers, due to chemotherapy 2016: Personal history of chemotherapy 2016: Personal history of radiation therapy No date: Visual blurriness     Comment:  SOMETHING NEW THAT HAS DEVELOPED SINCE 07-13-15 SURGERY-               SEEN AT [facility] ENT AND EXAM WAS NORMAL PER PT (08-09-15)  Past Surgical History: 1987: ABDOMINAL HYSTERECTOMY No date: BREAST BIOPSY; Right     Comment:  neg No date: BREAST BIOPSY; Left     Comment:  4 oclock, FIBROEPITHELIAL LESION FAVOR CELLULAR               FIBROADENOMA No date: BREAST BIOPSY; Left     Comment:  2 oclock, CYSTIC APOCRINE METAPLASIA 07/13/15: BREAST BIOPSY; Right     Comment:  130 INVASIVE MAMMARY CARCINOMA, NO SPECIAL TYPE 2016: BREAST EXCISIONAL BIOPSY; Left     Comment:  fibroadenoma 07/13/2015: BREAST LUMPECTOMY; Left 07/13/2015: BREAST LUMPECTOMY WITH SENTINEL  LYMPH NODE BIOPSY; Right     Comment:  Procedure: BREAST LUMPECTOMY WITH SENTINEL LYMPH NODE               BX;  Surgeon: Kieth Brightly, MD;  Location: [facility]               ORS;  Service: General;  Laterality: Right; 1993: BUNIONECTOMY; Right 09/17/2016: CATARACT EXTRACTION W/PHACO; Left     Comment:  Procedure: CATARACT EXTRACTION PHACO AND INTRAOCULAR               LENS PLACEMENT (IOC);  Surgeon: Galen Manila, MD;                Location: [facility] ORS;  Service: Ophthalmology;  Laterality:              Left;  [place] 00:47AP% 17.6CDE 8.39Fluid pack lot # [id number] 01/21/2017: CATARACT EXTRACTION W/PHACO; Right     Comment:  Procedure: CATARACT EXTRACTION PHACO AND INTRAOCULAR               LENS PLACEMENT (IOC);  Surgeon: Galen Manila, MD;                Location: [facility] ORS;  Service: Ophthalmology;  Laterality:              Right;  [place] 00:39 AP% 13.0 CDE 5.16 fluid pack lot #  [id number] 2015: COLONOSCOPY 2013: fatty tumor; Left     Comment:  side 2017: PORT-A-CATH REMOVAL 08/16/2015: PORTACATH PLACEMENT; Left     Comment:  Procedure: INSERTION PORT-A-CATH;  Surgeon: Kieth Brightly, MD;  Location: [facility] ORS;  Service: General;                Laterality: Left;     Reproductive/Obstetrics negative OB ROS                             Anesthesia Physical Anesthesia Plan  ASA: 2  Anesthesia Plan: General   Post-op Pain Management:    Induction:   PONV Risk Score and Plan: Propofol infusion and TIVA  Airway Management Planned:   Additional Equipment:   Intra-op Plan:   Post-operative Plan:   Informed Consent:      Dental Advisory Given  Plan Discussed with: CRNA and Surgeon  Anesthesia Plan Comments:         Anesthesia Quick Evaluation

## 2022-12-18 ENCOUNTER — Ambulatory Visit: Payer: Medicare PPO | Admitting: Anesthesiology

## 2022-12-18 ENCOUNTER — Encounter
Admission: RE | Disposition: A | Discharge status: Home / Self Care | Payer: Self-pay | Source: Home / Self Care | Attending: Gastroenterology

## 2022-12-18 ENCOUNTER — Encounter: Payer: Self-pay | Admitting: Gastroenterology

## 2022-12-18 ENCOUNTER — Other Ambulatory Visit: Payer: Self-pay

## 2022-12-18 ENCOUNTER — Ambulatory Visit
Admission: RE | Admit: 2022-12-18 | Discharge: 2022-12-18 | Disposition: A | Discharge status: Home / Self Care | Payer: Medicare PPO | Attending: Gastroenterology | Admitting: Gastroenterology

## 2022-12-18 DIAGNOSIS — I10 Essential (primary) hypertension: Secondary | ICD-10-CM | POA: Diagnosis not present

## 2022-12-18 DIAGNOSIS — Z853 Personal history of malignant neoplasm of breast: Secondary | ICD-10-CM | POA: Diagnosis not present

## 2022-12-18 DIAGNOSIS — E785 Hyperlipidemia, unspecified: Secondary | ICD-10-CM | POA: Diagnosis not present

## 2022-12-18 DIAGNOSIS — Z87898 Personal history of other specified conditions: Secondary | ICD-10-CM | POA: Diagnosis not present

## 2022-12-18 DIAGNOSIS — G629 Polyneuropathy, unspecified: Secondary | ICD-10-CM | POA: Diagnosis not present

## 2022-12-18 DIAGNOSIS — R194 Change in bowel habit: Secondary | ICD-10-CM | POA: Diagnosis not present

## 2022-12-18 DIAGNOSIS — K6289 Other specified diseases of anus and rectum: Secondary | ICD-10-CM | POA: Insufficient documentation

## 2022-12-18 HISTORY — PX: COLONOSCOPY WITH PROPOFOL: SHX5780

## 2022-12-18 SURGERY — COLONOSCOPY WITH PROPOFOL
Anesthesia: General

## 2022-12-18 MED ORDER — SODIUM CHLORIDE 0.9 % IV SOLN: INTRAVENOUS | Status: DC

## 2022-12-18 MED ORDER — PHENYLEPHRINE HCL (PRESSORS) 10 MG/ML IV SOLN
INTRAVENOUS | Status: DC | PRN
  Administered 2022-12-18: 120 ug via INTRAVENOUS

## 2022-12-18 MED ORDER — PROPOFOL 10 MG/ML IV BOLUS
INTRAVENOUS | Status: DC | PRN
  Administered 2022-12-18: 30 mg via INTRAVENOUS
  Administered 2022-12-18: 50 mg via INTRAVENOUS

## 2022-12-18 MED ORDER — PROPOFOL 500 MG/50ML IV EMUL
INTRAVENOUS | Status: DC | PRN
  Administered 2022-12-18: 199.62 ug/kg/min via INTRAVENOUS

## 2022-12-18 MED ORDER — LIDOCAINE HCL (CARDIAC) PF 100 MG/5ML IV SOSY
PREFILLED_SYRINGE | INTRAVENOUS | Status: DC | PRN
  Administered 2022-12-18: 100 mg via INTRAVENOUS

## 2022-12-18 NOTE — Transfer of Care (Signed)
Immediate Anesthesia Transfer of Care Note  Patient: Sandra [name]  Procedure(s) Performed: COLONOSCOPY WITH PROPOFOL  Patient Location: PACU  Anesthesia Type:General  Level of Consciousness: drowsy  Airway & Oxygen Therapy: Patient Spontanous Breathing and Patient connected to nasal cannula oxygen  Post-op Assessment: Report given to RN and Post -op Vital signs reviewed and stable  Post vital signs: stable  Last Vitals:  Vitals Value Taken Time  BP    Temp    Pulse    Resp    SpO2      Last Pain:  Vitals:   12/18/22 1010  TempSrc: Temporal  PainSc: 0-No pain         Complications: No notable events documented.

## 2022-12-18 NOTE — H&P (Signed)
Arlyss Repress, MD [address]  [address]  [place], [place] [address]  Main: [phone]  Fax: [phone] Pager: [phone]  Primary Care Physician:  Eden Emms, NP Primary Gastroenterologist:  Dr. Arlyss Repress  Pre-Procedure History & Physical: HPI:  Sandra [name] is a 77 y.o. female is here for an colonoscopy.   Past Medical History:  Diagnosis Date   Breast cancer (HCC) 07/13/2015   RIGHT lumpectomy 07/13/15, pT1c, pN0,M0 Stage 1 Er/PR pos, her 2 neg. 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, due to chemotherapy   Personal history of chemotherapy 2016   Personal history of radiation therapy 2016   Visual blurriness    SOMETHING NEW THAT HAS DEVELOPED SINCE 07-13-15 SURGERY- SEEN AT [facility] ENT AND EXAM WAS NORMAL PER PT (08-09-15)    Past Surgical History:  Procedure Laterality Date   ABDOMINAL HYSTERECTOMY  1987   BREAST BIOPSY Right    neg   BREAST BIOPSY Left    4 oclock, FIBROEPITHELIAL LESION FAVOR CELLULAR FIBROADENOMA   BREAST BIOPSY Left    2 oclock, CYSTIC APOCRINE METAPLASIA   BREAST BIOPSY Right 07/13/15   130 INVASIVE MAMMARY CARCINOMA, NO SPECIAL TYPE   BREAST EXCISIONAL BIOPSY Left 2016   fibroadenoma   BREAST LUMPECTOMY Left 07/13/2015   BREAST LUMPECTOMY WITH SENTINEL LYMPH NODE BIOPSY Right 07/13/2015   Procedure: BREAST LUMPECTOMY WITH SENTINEL LYMPH NODE BX;  Surgeon: Kieth Brightly, MD;  Location: [facility] ORS;  Service: General;  Laterality: Right;   BUNIONECTOMY Right 1993   CATARACT EXTRACTION W/PHACO Left 09/17/2016   Procedure: CATARACT EXTRACTION PHACO AND INTRAOCULAR LENS PLACEMENT (IOC);  Surgeon: Galen Manila, MD;  Location: [facility] ORS;  Service: Ophthalmology;  Laterality: Left;  [place] 00:47AP% 17.6CDE 8.39Fluid pack lot # [id number]   CATARACT EXTRACTION W/PHACO Right 01/21/2017   Procedure: CATARACT EXTRACTION PHACO AND INTRAOCULAR LENS PLACEMENT (IOC);   Surgeon: Galen Manila, MD;  Location: [facility] ORS;  Service: Ophthalmology;  Laterality: Right;  [place] 00:39 AP% 13.0 CDE 5.16 fluid pack lot # [id number]   COLONOSCOPY  2015   fatty tumor Left 2013   side   PORT-A-CATH REMOVAL  2017   PORTACATH PLACEMENT Left 08/16/2015   Procedure: INSERTION PORT-A-CATH;  Surgeon: Kieth Brightly, MD;  Location: [facility] ORS;  Service: General;  Laterality: Left;    Prior to Admission medications   Medication Sig Start Date End Date Taking? Authorizing Provider  amLODipine (NORVASC) 5 MG tablet Take 1 tablet (5 mg total) by mouth daily. 05/29/22  Yes Eden Emms, NP  anastrozole (ARIMIDEX) 1 MG tablet Take 1 tablet (1 mg total) by mouth daily. 09/10/22  Yes Earna Coder, MD  b complex vitamins tablet Take 1 tablet by mouth daily.   Yes [provider]  BIOTIN PO Take 5,000 mcg by mouth daily.   Yes [provider]  CALCIUM PO Take 1,000 mg by mouth daily.   Yes [provider]  COD LIVER OIL/VITAMINS A & D CAPS Take by mouth.   Yes [provider]  gabapentin (NEURONTIN) 300 MG capsule Take 1 capsule (300 mg total) by mouth 3 (three) times daily. 05/29/22  Yes Eden Emms, NP  Garlic 1000 MG CAPS Take by mouth.   Yes [provider]  lisinopril (ZESTRIL) 10 MG tablet Take 1 tablet (10 mg total) by mouth daily. 05/29/22  Yes Mordecai Maes  M, NP  Potassium 99 MG TABS Take 99 mg by mouth daily.    Yes [provider]  Flaxseed, Linseed, (FLAX SEEDS PO) Take by mouth daily.    [provider]  polyethylene glycol powder (GLYCOLAX/MIRALAX) 17 GM/SCOOP powder Take 1 Container by mouth daily.    [provider]    Allergies as of 12/04/2022 - Review Complete 12/02/2022  Allergen Reaction Noted   Cymbalta [duloxetine hcl] Other (See Comments) 02/27/2016   Lovastatin Anaphylaxis 01/15/2012    Family History  Problem Relation Age of Onset   Cancer Sister 1       breast  cancer   Breast cancer Sister 41   Colon cancer Neg Hx     Social History   Socioeconomic History   Marital status: Married    Spouse name: Not on file   Number of children: Not on file   Years of education: Not on file   Highest education level: Not on file  Occupational History   Not on file  Tobacco Use   Smoking status: Never   Smokeless tobacco: Never  Vaping Use   Vaping Use: Never used  Substance and Sexual Activity   Alcohol use: No    Alcohol/week: 0.0 standard drinks of alcohol   Drug use: No   Sexual activity: Yes  Other Topics Concern   Not on file  Social History Narrative   End of life wishes (updated 01/2013)-   Daughter of wishes Administrator, arts)      Desires CPR.   Desires life support if reasonable.         Social Determinants of Health   Financial Resource Strain: Low Risk  (06/25/2022)   Overall Financial Resource Strain (CARDIA)    Difficulty of Paying Living Expenses: Not hard at all  Food Insecurity: No Food Insecurity (06/25/2022)   Hunger Vital Sign    Worried About Running Out of Food in the Last Year: Never true    Ran Out of Food in the Last Year: Never true  Transportation Needs: No Transportation Needs (06/25/2022)   PRAPARE - Administrator, Civil Service (Medical): No    Lack of Transportation (Non-Medical): No  Physical Activity: Not on file  Stress: No Stress Concern Present (06/25/2022)   [employer] of Occupational Health - Occupational Stress Questionnaire    Feeling of Stress : Not at all  Social Connections: Unknown (06/25/2022)   Social Connection and Isolation Panel [NHANES]    Frequency of Communication with Friends and Family: More than three times a week    Frequency of Social Gatherings with Friends and Family: More than three times a week    Attends Religious Services: More than 4 times per year    Active Member of [employer] or Organizations: Not on file    Attends Banker Meetings: Not  on file    Marital Status: Married  Intimate Partner Violence: Not At Risk (06/25/2022)   Humiliation, Afraid, Rape, and Kick questionnaire    Fear of Current or Ex-Partner: No    Emotionally Abused: No    Physically Abused: No    Sexually Abused: No    Review of Systems: See HPI, otherwise negative ROS  Physical Exam: BP 125/79   Pulse 90   Temp (!) 97.1 F (36.2 C) (Temporal)   Resp 16   Ht 5\' 2"  (1.575 m)   Wt 52.6 kg   LMP  (LMP Unknown)   SpO2 100%  BMI 21.22 kg/m  General:   Alert,  pleasant and cooperative in NAD Head:  Normocephalic and atraumatic. Neck:  Supple; no masses or thyromegaly. Lungs:  Clear throughout to auscultation.    Heart:  Regular rate and rhythm. Abdomen:  Soft, nontender and nondistended. Normal bowel sounds, without guarding, and without rebound.   Neurologic:  Alert and  oriented x4;  grossly normal neurologically.  Impression/Plan: Sandra [name] is here for an colonoscopy to be performed for change in bowel habits  Risks, benefits, limitations, and alternatives regarding  colonoscopy have been reviewed with the patient.  Questions have been answered.  All parties agreeable.   Lannette Donath, MD  12/18/2022, 10:23 AM

## 2022-12-18 NOTE — Anesthesia Postprocedure Evaluation (Signed)
Anesthesia Post Note  Patient: Sandra [name]  Procedure(s) Performed: COLONOSCOPY WITH PROPOFOL  Patient location during evaluation: Endoscopy Anesthesia Type: General Level of consciousness: awake and alert Pain management: pain level controlled Vital Signs Assessment: post-procedure vital signs reviewed and stable Respiratory status: spontaneous breathing, nonlabored ventilation and respiratory function stable Cardiovascular status: blood pressure returned to baseline and stable Postop Assessment: no apparent nausea or vomiting Anesthetic complications: no   No notable events documented.   Last Vitals:  Vitals:   12/18/22 1010 12/18/22 1224  BP: 125/79 (!) 92/50  Pulse: 90   Resp: 16   Temp: (!) 36.2 C 36.7 C  SpO2: 100%     Last Pain:  Vitals:   12/18/22 1235  TempSrc:   PainSc: 0-No pain                 Foye Deer

## 2022-12-18 NOTE — Op Note (Signed)
[facility] Gastroenterology Patient Name: Sandra [name] Procedure Date: 12/18/2022 11:50 AM MRN: [id number] Account #: [id number] Date of Birth: February 16, 1946 Admit Type: Outpatient Age: 77 Room: [facility] ENDO ROOM 4 Gender: Female Note Status: Finalized Instrument Name: Peds Colonoscope [id number] Procedure:             Colonoscopy Indications:           Last colonoscopy 10 years ago, Change in bowel habits Providers:             Toney Reil MD, MD Referring MD:          Genene Churn. Toney Reil (Referring MD) Medicines:             General Anesthesia Complications:         No immediate complications. Estimated blood loss: None. Procedure:             Pre-Anesthesia Assessment:                        - Prior to the procedure, a History and Physical was                         performed, and patient medications and allergies were                         reviewed. The patient is competent. The risks and                         benefits of the procedure and the sedation options and                         risks were discussed with the patient. All questions                         were answered and informed consent was obtained.                         Patient identification and proposed procedure were                         verified by the physician, the nurse, the                         anesthesiologist, the anesthetist and the technician                         in the pre-procedure area in the procedure room in the                         endoscopy suite. Mental Status Examination: alert and                         oriented. Airway Examination: normal oropharyngeal                         airway and neck mobility. Respiratory Examination:                         clear to auscultation. CV Examination: normal.  Prophylactic Antibiotics: The patient does not require                         prophylactic antibiotics. Prior Anticoagulants: The                          patient has taken no anticoagulant or antiplatelet                         agents. ASA Grade Assessment: II - A patient with mild                         systemic disease. After reviewing the risks and                         benefits, the patient was deemed in satisfactory                         condition to undergo the procedure. The anesthesia                         plan was to use general anesthesia. Immediately prior                         to administration of medications, the patient was                         re-assessed for adequacy to receive sedatives. The                         heart rate, respiratory rate, oxygen saturations,                         blood pressure, adequacy of pulmonary ventilation, and                         response to care were monitored throughout the                         procedure. The physical status of the patient was                         re-assessed after the procedure.                        After obtaining informed consent, the colonoscope was                         passed under direct vision. Throughout the procedure,                         the patient's blood pressure, pulse, and oxygen                         saturations were monitored continuously. The                         Colonoscope was introduced through the anus and  advanced to the the cecum, identified by appendiceal                         orifice and ileocecal valve. The colonoscopy was                         performed without difficulty. The patient tolerated                         the procedure well. The quality of the bowel                         preparation was evaluated using the BBPS [facility] Bowel                         Preparation Scale) with scores of: Right Colon = 3,                         Transverse Colon = 3 and Left Colon = 3 (entire mucosa                         seen well with no residual staining, small fragments                          of stool or opaque liquid). The total BBPS score                         equals 9. The ileocecal valve, appendiceal orifice,                         and rectum were photographed. Findings:      The perianal and digital rectal examinations were normal. Pertinent       negatives include normal sphincter tone and no palpable rectal lesions.      A diffuse area of mildly erythematous mucosa was found in the distal       rectum. Biopsies were taken with a cold forceps for histology.      The exam was otherwise without abnormality.      Normal mucosa was found in the entire colon. Impression:            - Erythematous mucosa in the distal rectum. Biopsied.                        - The examination was otherwise normal.                        - Normal mucosa in the entire examined colon. Recommendation:        - Discharge patient to home (with escort).                        - Resume previous diet today.                        - Continue present medications.                        - Await pathology results. Procedure Code(s):     --- Professional ---  [address], Colonoscopy, flexible; with biopsy, single or                         multiple Diagnosis Code(s):     --- Professional ---                        K62.89, Other specified diseases of anus and rectum                        R19.4, Change in bowel habit CPT copyright 2022 American Medical Association. All rights reserved. The codes documented in this report are preliminary and upon coder review may  be revised to meet current compliance requirements. Dr. Libby Maw Toney Reil MD, MD 12/18/2022 12:20:46 PM This report has been signed electronically. Number of Addenda: 0 Note Initiated On: 12/18/2022 11:50 AM Scope Withdrawal Time: 0 hours 8 minutes 58 seconds  Total Procedure Duration: 0 hours 13 minutes 13 seconds  Estimated Blood Loss:  Estimated blood loss: none.      [facility]

## 2022-12-19 ENCOUNTER — Encounter: Payer: Self-pay | Admitting: Gastroenterology

## 2022-12-19 LAB — SURGICAL PATHOLOGY

## 2022-12-20 ENCOUNTER — Telehealth: Payer: Self-pay

## 2022-12-20 DIAGNOSIS — H40039 Anatomical narrow angle, unspecified eye: Secondary | ICD-10-CM | POA: Diagnosis not present

## 2022-12-20 DIAGNOSIS — H43813 Vitreous degeneration, bilateral: Secondary | ICD-10-CM | POA: Diagnosis not present

## 2022-12-20 DIAGNOSIS — Z961 Presence of intraocular lens: Secondary | ICD-10-CM | POA: Diagnosis not present

## 2022-12-20 NOTE — Telephone Encounter (Signed)
-----   Message from Toney Reil, MD sent at 12/19/2022 12:15 PM EDT ----- Please inform patient that the pathology results from her rectal biopsies came back normal.  There is no inflammation.  RV

## 2022-12-20 NOTE — Telephone Encounter (Signed)
Called patient and patient verbalized understanding of results  

## 2022-12-26 DIAGNOSIS — M8580 Other specified disorders of bone density and structure, unspecified site: Secondary | ICD-10-CM | POA: Diagnosis not present

## 2022-12-26 DIAGNOSIS — Z79811 Long term (current) use of aromatase inhibitors: Secondary | ICD-10-CM | POA: Diagnosis not present

## 2022-12-26 DIAGNOSIS — E785 Hyperlipidemia, unspecified: Secondary | ICD-10-CM | POA: Diagnosis not present

## 2022-12-26 DIAGNOSIS — D84821 Immunodeficiency due to drugs: Secondary | ICD-10-CM | POA: Diagnosis not present

## 2022-12-26 DIAGNOSIS — I739 Peripheral vascular disease, unspecified: Secondary | ICD-10-CM | POA: Diagnosis not present

## 2022-12-26 DIAGNOSIS — I10 Essential (primary) hypertension: Secondary | ICD-10-CM | POA: Diagnosis not present

## 2022-12-26 DIAGNOSIS — G620 Drug-induced polyneuropathy: Secondary | ICD-10-CM | POA: Diagnosis not present

## 2022-12-26 DIAGNOSIS — C50919 Malignant neoplasm of unspecified site of unspecified female breast: Secondary | ICD-10-CM | POA: Diagnosis not present

## 2023-01-27 ENCOUNTER — Telehealth: Payer: Self-pay | Admitting: Nurse Practitioner

## 2023-01-27 NOTE — Telephone Encounter (Signed)
Left message to return call to our office.  

## 2023-01-27 NOTE — Telephone Encounter (Signed)
Pt called in stating she had a rep from [employer] visit her home recently & wanted to ask Toney Reil should she scheduled any type of f/u with him since her home visit? Call back # [phone]

## 2023-01-27 NOTE — Telephone Encounter (Signed)
I dont think she should need too unless she needs to see me or they suggested that she follow up with me

## 2023-01-28 NOTE — Telephone Encounter (Signed)
Patient returned call to our office, relayed message from [place] below. Stated she was feeling okay and letter from [employer] did not tell her to follow up with pcp. Patient says she has an appt in October for cpe and if anything changes she will come in before then.

## 2023-01-28 NOTE — Telephone Encounter (Signed)
Error

## 2023-03-03 DIAGNOSIS — H6123 Impacted cerumen, bilateral: Secondary | ICD-10-CM | POA: Diagnosis not present

## 2023-03-03 DIAGNOSIS — H9313 Tinnitus, bilateral: Secondary | ICD-10-CM | POA: Diagnosis not present

## 2023-03-06 ENCOUNTER — Other Ambulatory Visit: Payer: Self-pay | Admitting: Internal Medicine

## 2023-03-06 DIAGNOSIS — Z170 Estrogen receptor positive status [ER+]: Secondary | ICD-10-CM

## 2023-03-06 DIAGNOSIS — Z95828 Presence of other vascular implants and grafts: Secondary | ICD-10-CM

## 2023-03-11 ENCOUNTER — Inpatient Hospital Stay: Payer: Medicare PPO | Attending: Internal Medicine | Admitting: Internal Medicine

## 2023-03-11 ENCOUNTER — Encounter: Payer: Self-pay | Admitting: Internal Medicine

## 2023-03-11 ENCOUNTER — Inpatient Hospital Stay: Payer: Medicare PPO

## 2023-03-11 DIAGNOSIS — G629 Polyneuropathy, unspecified: Secondary | ICD-10-CM | POA: Insufficient documentation

## 2023-03-11 DIAGNOSIS — Z170 Estrogen receptor positive status [ER+]: Secondary | ICD-10-CM | POA: Insufficient documentation

## 2023-03-11 DIAGNOSIS — C50211 Malignant neoplasm of upper-inner quadrant of right female breast: Secondary | ICD-10-CM | POA: Insufficient documentation

## 2023-03-11 DIAGNOSIS — D708 Other neutropenia: Secondary | ICD-10-CM | POA: Diagnosis not present

## 2023-03-11 DIAGNOSIS — M8580 Other specified disorders of bone density and structure, unspecified site: Secondary | ICD-10-CM | POA: Insufficient documentation

## 2023-03-11 DIAGNOSIS — Z79811 Long term (current) use of aromatase inhibitors: Secondary | ICD-10-CM | POA: Insufficient documentation

## 2023-03-11 DIAGNOSIS — Z95828 Presence of other vascular implants and grafts: Secondary | ICD-10-CM | POA: Diagnosis not present

## 2023-03-11 DIAGNOSIS — Z803 Family history of malignant neoplasm of breast: Secondary | ICD-10-CM | POA: Diagnosis not present

## 2023-03-11 LAB — CBC WITH DIFFERENTIAL/PLATELET
Abs Immature Granulocytes: 0 10*3/uL (ref 0.00–0.07)
Basophils Absolute: 0 10*3/uL (ref 0.0–0.1)
Basophils Relative: 1 %
Eosinophils Absolute: 0.1 10*3/uL (ref 0.0–0.5)
Eosinophils Relative: 4 %
HCT: 37.5 % (ref 36.0–46.0)
Hemoglobin: 12.3 g/dL (ref 12.0–15.0)
Immature Granulocytes: 0 %
Lymphocytes Relative: 40 %
Lymphs Abs: 0.9 10*3/uL (ref 0.7–4.0)
MCH: 29.9 pg (ref 26.0–34.0)
MCHC: 32.8 g/dL (ref 30.0–36.0)
MCV: 91 fL (ref 80.0–100.0)
Monocytes Absolute: 0.2 10*3/uL (ref 0.1–1.0)
Monocytes Relative: 10 %
Neutro Abs: 1 10*3/uL — ABNORMAL LOW (ref 1.7–7.7)
Neutrophils Relative %: 45 %
Platelets: 285 10*3/uL (ref 150–400)
RBC: 4.12 MIL/uL (ref 3.87–5.11)
RDW: 12.1 % (ref 11.5–15.5)
WBC: 2.3 10*3/uL — ABNORMAL LOW (ref 4.0–10.5)
nRBC: 0 % (ref 0.0–0.2)

## 2023-03-11 LAB — COMPREHENSIVE METABOLIC PANEL
ALT: 20 U/L (ref 0–44)
AST: 26 U/L (ref 15–41)
Albumin: 4 g/dL (ref 3.5–5.0)
Alkaline Phosphatase: 50 U/L (ref 38–126)
Anion gap: 8 (ref 5–15)
BUN: 14 mg/dL (ref 8–23)
CO2: 25 mmol/L (ref 22–32)
Calcium: 9 mg/dL (ref 8.9–10.3)
Chloride: 104 mmol/L (ref 98–111)
Creatinine, Ser: 0.88 mg/dL (ref 0.44–1.00)
GFR, Estimated: >60 mL/min (ref >60)
Glucose, Bld: 109 mg/dL — ABNORMAL HIGH (ref 70–99)
Potassium: 3.8 mmol/L (ref 3.5–5.1)
Sodium: 137 mmol/L (ref 135–145)
Total Bilirubin: 0.8 mg/dL (ref 0.3–1.2)
Total Protein: 6.5 g/dL (ref 6.5–8.1)

## 2023-03-11 MED ORDER — ANASTROZOLE 1 MG PO TABS
1.0000 mg | ORAL_TABLET | Freq: Every day | ORAL | 1 refills | Status: DC
Start: 2023-03-11 — End: 2023-09-09

## 2023-03-11 NOTE — Progress Notes (Signed)
Cancer Center @ [facility] Telephone:([phone]) [phone]  Fax:(336) [phone]   INITIAL CONSULT  Sandra [name] OB: May 06, 1946  MR#: [id number]  OZD#:[id number]  Patient Care Team: Eden Emms, NP as PCP - General (Family Medicine) Tora Duck, PA-C (Physician Assistant) Pyrtle, Carie Caddy, MD as Consulting Physician (Gastroenterology) Dianne Dun, MD (Inactive) as Consulting Physician (Family Medicine) Kieth Brightly, MD (General Surgery) Earna Coder, MD as Consulting Physician (Internal Medicine) Peggye Pitt, RN as Oncology Nurse Navigator Carmina Miller, MD as Referring Physician (Radiation Oncology)  CHIEF COMPLAINT:  Chief Complaint  Patient presents with   Follow-up    Carcinoma of upper-inner quadrant of right breast in female, estrogen receptor positive (HCC)   VISIT DIAGNOSIS:     ICD-10-CM   1. Port-A-Cath in place  Z95.828 anastrozole (ARIMIDEX) 1 MG tablet    2. Carcinoma of upper-inner quadrant of right breast in female, estrogen receptor positive (HCC)  C50.211 anastrozole (ARIMIDEX) 1 MG tablet   Z17.0 MM 3D SCREENING MAMMOGRAM BILATERAL BREAST    DG Bone Density    CBC with Differential (Cancer Center Only)    CMP (Cancer Center only)    VITAMIN D 25 Hydroxy (Vit-D Deficiency, Fractures)       Oncology History Overview Note   carcinoma breast (right) inner and upper quadrant T1 cN0 M0 tumor Estrogen receptor positive progesterone receptor positive HER-2 receptor negative by fish Mammo print shows high risk (diagnosis in January of 2016) patient has 22% average risk in 5 years and 29% average risk in 10 years for distant metastectomy disease without chemotherapy on anti-hormonal treatment 2.  Patient started Cytoxan and Adriamycin on now August 30, 2015 MUGA scan shows ejection fraction of baseline 55%.  3.Patient has finished total of 4 cycles of chemotherapy on November 01, 2015 4.  Started on the Taxol from November 22, 2015;  FINISHED RT [sep 2017] # OCT 2017- START ARIMIDEX   # NOV 2021 BMD- T score= - 1.9 -OSTEOPENIA- ca+vit D  # SURVIVORSHIP: O  --------------------------------------------------------   DIAGNOSIS: RIGHT BREAST CA  STAGE: I        ;GOALS: cure  CURRENT/MOST RECENT THERAPY: on anastrazole      Carcinoma of upper-inner quadrant of right breast in female, estrogen receptor positive (HCC)   INTERVAL HISTORY: Alone.  Ambulating independently.  76 year old lady History of stage I right-sided breast cancer ER/PR positive currently on Arimidex is here for follow-up.  Taking anastrozole daily. No side effects. Energy is very good. No concerns today.    Patient denies any worsening joint pains or bone pain.  Patient has intermittent cramps in the legs.  Otherwise denies any worsening tingling or numbness.  She continues with gabapentin.   Review of Systems  Constitutional:  Negative for chills, diaphoresis, fever, malaise/fatigue and weight loss.  HENT:  Negative for nosebleeds and sore throat.   Eyes:  Negative for double vision.  Respiratory:  Negative for cough, hemoptysis, sputum production, shortness of breath and wheezing.   Cardiovascular:  Negative for chest pain, palpitations, orthopnea and leg swelling.  Gastrointestinal:  Negative for abdominal pain, blood in stool, constipation, diarrhea, heartburn, melena, nausea and vomiting.  Genitourinary:  Negative for dysuria, frequency and urgency.  Musculoskeletal:  Negative for back pain and joint pain.  Skin: Negative.  Negative for itching and rash.  Neurological:  Positive for tingling. Negative for dizziness, focal weakness, weakness and headaches.  Endo/Heme/Allergies:  Does not bruise/bleed easily.  Psychiatric/Behavioral:  Negative for depression.  The patient is not nervous/anxious and does not have insomnia.      PAST MEDICAL HISTORY: Past Medical History:  Diagnosis Date   Breast cancer (HCC) 07/13/2015   RIGHT  lumpectomy 07/13/15, pT1c, pN0,M0 Stage 1 Er/PR pos, her 2 neg. 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, due to chemotherapy   Personal history of chemotherapy 2016   Personal history of radiation therapy 2016   Visual blurriness    SOMETHING NEW THAT HAS DEVELOPED SINCE 07-13-15 SURGERY- SEEN AT [facility] AND EXAM WAS NORMAL PER PT (08-09-15)    PAST SURGICAL HISTORY: Past Surgical History:  Procedure Laterality Date   ABDOMINAL HYSTERECTOMY  1987   BREAST BIOPSY Right    neg   BREAST BIOPSY Left    4 oclock, FIBROEPITHELIAL LESION FAVOR CELLULAR FIBROADENOMA   BREAST BIOPSY Left    2 oclock, CYSTIC APOCRINE METAPLASIA   BREAST BIOPSY Right 07/13/15   130 INVASIVE MAMMARY CARCINOMA, NO SPECIAL TYPE   BREAST EXCISIONAL BIOPSY Left 2016   fibroadenoma   BREAST LUMPECTOMY Left 07/13/2015   BREAST LUMPECTOMY WITH SENTINEL LYMPH NODE BIOPSY Right 07/13/2015   Procedure: BREAST LUMPECTOMY WITH SENTINEL LYMPH NODE BX;  Surgeon: Kieth Brightly, MD;  Location: [facility] ORS;  Service: General;  Laterality: Right;   BUNIONECTOMY Right 1993   CATARACT EXTRACTION W/PHACO Left 09/17/2016   Procedure: CATARACT EXTRACTION PHACO AND INTRAOCULAR LENS PLACEMENT (IOC);  Surgeon: Galen Manila, MD;  Location: [facility] ORS;  Service: Ophthalmology;  Laterality: Left;  [place] 00:47AP% 17.6CDE 8.39Fluid pack lot # [id number]   CATARACT EXTRACTION W/PHACO Right 01/21/2017   Procedure: CATARACT EXTRACTION PHACO AND INTRAOCULAR LENS PLACEMENT (IOC);  Surgeon: Galen Manila, MD;  Location: [facility] ORS;  Service: Ophthalmology;  Laterality: Right;  [place] 00:39 AP% 13.0 CDE 5.16 fluid pack lot # [id number]   COLONOSCOPY  2015   COLONOSCOPY WITH PROPOFOL N/A 12/18/2022   Procedure: COLONOSCOPY WITH PROPOFOL;  Surgeon: Toney Reil, MD;  Location: [facility];  Service: Gastroenterology;  Laterality: N/A;   fatty tumor Left 2013   side   PORT-A-CATH  REMOVAL  2017   PORTACATH PLACEMENT Left 08/16/2015   Procedure: INSERTION PORT-A-CATH;  Surgeon: Kieth Brightly, MD;  Location: [facility] ORS;  Service: General;  Laterality: Left;    FAMILY HISTORY Family History  Problem Relation Age of Onset   Cancer Sister 50       breast cancer   Breast cancer Sister 28   Colon cancer Neg Hx         ADVANCED DIRECTIVES:   Patient does not have any living will or healthcare power of attorney.  Information was given .  Available resources had been discussed.   HEALTH MAINTENANCE: Social History   Tobacco Use   Smoking status: Never   Smokeless tobacco: Never  Vaping Use   Vaping status: Never Used  Substance Use Topics   Alcohol use: No    Alcohol/week: 0.0 standard drinks of alcohol   Drug use: No      :  Allergies  Allergen Reactions   Cymbalta [Duloxetine Hcl] Other (See Comments)    Glaucoma.  Unable to take   Lovastatin Anaphylaxis    Tongue swelling    Current Outpatient Medications  Medication Sig Dispense Refill   amLODipine (NORVASC) 5 MG tablet Take 1 tablet (5 mg total) by mouth daily. 90 tablet 3   b complex  vitamins tablet Take 1 tablet by mouth daily.     BIOTIN PO Take 5,000 mcg by mouth daily.     CALCIUM PO Take 1,000 mg by mouth daily.     COD LIVER OIL/VITAMINS A & D CAPS Take by mouth.     Flaxseed, Linseed, (FLAX SEEDS PO) Take by mouth daily.     gabapentin (NEURONTIN) 300 MG capsule Take 1 capsule (300 mg total) by mouth 3 (three) times daily. 270 capsule 3   Garlic 1000 MG CAPS Take by mouth.     lisinopril (ZESTRIL) 10 MG tablet Take 1 tablet (10 mg total) by mouth daily. 90 tablet 3   polyethylene glycol powder (GLYCOLAX/MIRALAX) 17 GM/SCOOP powder Take 1 Container by mouth daily.     Potassium 99 MG TABS Take 99 mg by mouth daily.      anastrozole (ARIMIDEX) 1 MG tablet Take 1 tablet (1 mg total) by mouth daily. 90 tablet 1   No current facility-administered medications for this visit.    Right and left BREAST exam [in the presence of nurse]- no unusual skin changes or dominant masses felt. Surgical scars noted.    OBJECTIVE: Physical Exam HENT:     Head: Normocephalic and atraumatic.     Mouth/Throat:     Pharynx: No oropharyngeal exudate.  Eyes:     Pupils: Pupils are equal, round, and reactive to light.  Cardiovascular:     Rate and Rhythm: Normal rate and regular rhythm.  Pulmonary:     Effort: No respiratory distress.     Breath sounds: No wheezing.  Abdominal:     General: Bowel sounds are normal. There is no distension.     Palpations: Abdomen is soft. There is no mass.     Tenderness: There is no abdominal tenderness. There is no guarding or rebound.  Musculoskeletal:        General: No tenderness. Normal range of motion.     Cervical back: Normal range of motion and neck supple.  Skin:    General: Skin is warm.  Neurological:     Mental Status: She is alert and oriented to person, place, and time.  Psychiatric:        Mood and Affect: Affect normal.       Vitals:   03/11/23 0949  BP: (!) 153/75  Pulse: 72  Temp: (!) 95.8 F (35.4 C)  SpO2: 100%     Body mass index is 22.28 kg/m.    ECOG FS:0 - Asymptomatic  LAB RESULTS:  Appointment on 03/11/2023  Component Date Value Ref Range Status   Sodium 03/11/2023 137  135 - 145 mmol/L Final   Potassium 03/11/2023 3.8  3.5 - 5.1 mmol/L Final   Chloride 03/11/2023 104  98 - 111 mmol/L Final   CO2 03/11/2023 25  22 - 32 mmol/L Final   Glucose, Bld 03/11/2023 109 (H)  70 - 99 mg/dL Final   Glucose reference range applies only to samples taken after fasting for at least 8 hours.   BUN 03/11/2023 14  8 - 23 mg/dL Final   Creatinine, Ser 03/11/2023 0.88  0.44 - 1.00 mg/dL Final   Calcium 06/01/2535 9.0  8.9 - 10.3 mg/dL Final   Total Protein 64/40/3474 6.5  6.5 - 8.1 g/dL Final   Albumin 25/95/6387 4.0  3.5 - 5.0 g/dL Final   AST 56/43/3295 26  15 - 41 U/L Final   ALT 03/11/2023 20  0 - 44 U/L  Final  Alkaline Phosphatase 03/11/2023 50  38 - 126 U/L Final   Total Bilirubin 03/11/2023 0.8  0.3 - 1.2 mg/dL Final   GFR, Estimated 03/11/2023 >60  >60 mL/min Final   Comment: (NOTE) Calculated using the CKD-EPI Creatinine Equation (2021)    Anion gap 03/11/2023 8  5 - 15 Final   Performed at [facility], [address] [place], [place] [address]   WBC 03/11/2023 2.3 (L)  4.0 - 10.5 K/uL Final   RBC 03/11/2023 4.12  3.87 - 5.11 MIL/uL Final   Hemoglobin 03/11/2023 12.3  12.0 - 15.0 g/dL Final   HCT 19/14/7829 37.5  36.0 - 46.0 % Final   MCV 03/11/2023 91.0  80.0 - 100.0 fL Final   MCH 03/11/2023 29.9  26.0 - 34.0 pg Final   MCHC 03/11/2023 32.8  30.0 - 36.0 g/dL Final   RDW 56/21/3086 12.1  11.5 - 15.5 % Final   Platelets 03/11/2023 285  150 - 400 K/uL Final   nRBC 03/11/2023 0.0  0.0 - 0.2 % Final   Neutrophils Relative % 03/11/2023 45  % Final   Neutro Abs 03/11/2023 1.0 (L)  1.7 - 7.7 K/uL Final   Lymphocytes Relative 03/11/2023 40  % Final   Lymphs Abs 03/11/2023 0.9  0.7 - 4.0 K/uL Final   Monocytes Relative 03/11/2023 10  % Final   Monocytes Absolute 03/11/2023 0.2  0.1 - 1.0 K/uL Final   Eosinophils Relative 03/11/2023 4  % Final   Eosinophils Absolute 03/11/2023 0.1  0.0 - 0.5 K/uL Final   Basophils Relative 03/11/2023 1  % Final   Basophils Absolute 03/11/2023 0.0  0.0 - 0.1 K/uL Final   Immature Granulocytes 03/11/2023 0  % Final   Abs Immature Granulocytes 03/11/2023 0.00  0.00 - 0.07 K/uL Final   Performed at [facility], [address] [place], [place] [address]     STUDIES: No results found.  ASSESSMENT:   Carcinoma of upper-inner quadrant of right breast in female, estrogen receptor positive (HCC) # Stage I ER/PR positive HER-2/neu negative;  On Anastrazole on extended AI [until 2027].  Stable.  Tolerating very well; DEC 2023-Mammo-WNL.   # No clinical evidence of recurrence.  Continue anastrozole at this time.  Tolerating without any  major side effects.  Stable.    # Osteopenia- BMD- FEB 2022- T score= -.1.9 [2020: T score-1.9]; Continue calcium and vitamin D; Continue exercise/walking 3 miles/day-  Stable.   will order BMD in 2024. Will order next visit. Today.   # Chronic benign ethnic neutropenia /mild leucopenia- 2.3/ ANC-1.3-- stable  # Grade 2 neuropathy-Neurontin 300 mg TID- stable   # DISPOSITION:  # Mammogram/BMD in dec 2024- # follow up in 6 months-MD/labs- cbc/cmp; vit D 25 levels -Dr.B.    No matching staging information was found for the patient.  Earna Coder, MD   03/11/2023 10:45 AM

## 2023-03-11 NOTE — Progress Notes (Signed)
No concerns today 

## 2023-03-11 NOTE — Assessment & Plan Note (Addendum)
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  Stable.  Tolerating very well; DEC 2023-Mammo-WNL.   # No clinical evidence of recurrence.  Continue anastrozole at this time.  Tolerating without any major side effects.  Stable.    # Osteopenia- BMD- FEB 2022- T score= -.1.9 [2020: T score-1.9]; Continue calcium and vitamin D; Continue exercise/walking 3 miles/day-  Stable.   will order BMD in 2024. Will order next visit. Today.   # Chronic benign ethnic neutropenia /mild leucopenia- 2.3/ ANC-1.3-- stable  # Grade 2 neuropathy-Neurontin 300 mg TID- stable   # DISPOSITION:  # Mammogram/BMD in dec 2024- # follow up in 6 months-MD/labs- cbc/cmp; vit D 25 levels -Dr.B.

## 2023-05-12 ENCOUNTER — Other Ambulatory Visit: Payer: Self-pay | Admitting: Nurse Practitioner

## 2023-05-12 DIAGNOSIS — I10 Essential (primary) hypertension: Secondary | ICD-10-CM

## 2023-05-28 ENCOUNTER — Ambulatory Visit: Payer: Medicare PPO | Admitting: Dermatology

## 2023-05-28 VITALS — BP 142/73

## 2023-05-28 DIAGNOSIS — Z79899 Other long term (current) drug therapy: Secondary | ICD-10-CM

## 2023-05-28 DIAGNOSIS — Z7189 Other specified counseling: Secondary | ICD-10-CM | POA: Diagnosis not present

## 2023-05-28 DIAGNOSIS — L811 Chloasma: Secondary | ICD-10-CM | POA: Diagnosis not present

## 2023-05-28 NOTE — Patient Instructions (Addendum)
Instructions for [facility] Medications  One or more of your medications was sent to the [facility] mail order compounding pharmacy. You will receive an email from them and can purchase the medicine through that link. It will then be mailed to your home at the address you confirmed. If for any reason you do not receive an email from them, please check your spam folder. If you still do not find the email, please let us know. [facility] phone number is [phone].       Due to recent changes in healthcare laws, you may see results of your pathology and/or laboratory studies on MyChart before the doctors have had a chance to review them. We understand that in some cases there may be results that are confusing or concerning to you. Please understand that not all results are received at the same time and often the doctors may need to interpret multiple results in order to provide you with the best plan of care or course of treatment. Therefore, we ask that you please give [place] 2 business days to thoroughly review all your results before contacting the office for clarification. Should we see a critical lab result, you will be contacted sooner.   If You Need Anything After Your Visit  If you have any questions or concerns for your doctor, please call our main line at [phone] and press option 4 to reach your doctor's medical assistant. If no one answers, please leave a voicemail as directed and we will return your call as soon as possible. Messages left after 4 pm will be answered the following business day.   You may also send [place] a message via MyChart. We typically respond to MyChart messages within 1-2 business days.  For prescription refills, please ask your pharmacy to contact our office. Our fax number is [phone].  If you have an urgent issue when the clinic is closed that cannot wait until the next business day, you can page your doctor at the number below.    Please note  that while we do our best to be available for urgent issues outside of office hours, we are not available 24/7.   If you have an urgent issue and are unable to reach [place], you may choose to seek medical care at your doctor's office, retail clinic, urgent care center, or emergency room.  If you have a medical emergency, please immediately call 911 or go to the emergency department.  Pager Numbers  - Dr. Gwen Pounds: [phone]  - Dr. Roseanne Reno: [phone]  - Dr. Katrinka Blazing: [phone]   In the event of inclement weather, please call our main line at [phone] for an update on the status of any delays or closures.  Dermatology Medication Tips: Please keep the boxes that topical medications come in in order to help keep track of the instructions about where and how to use these. Pharmacies typically print the medication instructions only on the boxes and not directly on the medication tubes.   If your medication is too expensive, please contact our office at [phone] option 4 or send [place] a message through MyChart.   We are unable to tell what your co-pay for medications will be in advance as this is different depending on your insurance coverage. However, we may be able to find a substitute medication at lower cost or fill out paperwork to get insurance to cover a needed medication.   If a prior authorization is required to get your medication covered by your  insurance company, please allow [place] 1-2 business days to complete this process.  Drug prices often vary depending on where the prescription is filled and some pharmacies may offer cheaper prices.  The website www.goodrx.com contains coupons for medications through different pharmacies. The prices here do not account for what the cost may be with help from insurance (it may be cheaper with your insurance), but the website can give you the price if you did not use any insurance.  - You can print the associated coupon and take it with your  prescription to the pharmacy.  - You may also stop by our office during regular business hours and pick up a GoodRx coupon card.  - If you need your prescription sent electronically to a different pharmacy, notify our office through [facility] or by phone at [phone] option 4.     Si Usted Necesita Algo Despus de Su Visita  Tambin puede enviarnos un mensaje a travs de Clinical cytogeneticist. Por lo general respondemos a los mensajes de MyChart en el transcurso de 1 a 2 das hbiles.  Para renovar recetas, por favor pida a su farmacia que se ponga en contacto con nuestra oficina. Annie Sable de fax es [place] [phone].  Si tiene un asunto urgente cuando la clnica est cerrada y que no puede esperar hasta el siguiente da hbil, puede llamar/localizar a su doctor(a) al nmero que aparece a continuacin.   Por favor, tenga en cuenta que aunque hacemos todo lo posible para estar disponibles para asuntos urgentes fuera del horario de [place], no estamos disponibles las 24 horas del da, los 7 [address]  [address] de la [place].   Si tiene un problema urgente y no puede comunicarse con nosotros, puede optar por buscar atencin mdica  en el consultorio de su doctor(a), en una clnica privada, en un centro de atencin urgente o en una sala de emergencias.  Si tiene Engineer, drilling, por favor llame inmediatamente al 911 o vaya a la sala de emergencias.  Nmeros de bper  - Dr. Gwen Pounds: [phone]  - Dra. Roseanne Reno: [phone]  - Dr. Katrinka Blazing: [phone]   En caso de inclemencias del tiempo, por favor llame a Lacy Duverney principal al [phone] para una actualizacin sobre el [place] de cualquier retraso o cierre.  Consejos para la medicacin en dermatologa: Por favor, guarde las cajas en las que vienen los medicamentos de uso tpico para ayudarle a seguir las instrucciones sobre dnde y cmo usarlos. Las farmacias generalmente imprimen las instrucciones del medicamento slo en las cajas y no  directamente en los tubos del [place].   Si su medicamento es muy caro, por favor, pngase en contacto con Rolm Gala llamando al [phone] y presione la opcin 4 o envenos un mensaje a travs de Clinical cytogeneticist.   No podemos decirle cul ser su copago por los medicamentos por adelantado ya que esto es diferente dependiendo de la cobertura de su seguro. Sin embargo, es posible que podamos encontrar un medicamento sustituto a Audiological scientist un formulario para que el seguro cubra el medicamento que se considera necesario.   Si se requiere una autorizacin previa para que su compaa de seguros [place] su medicamento, por favor permtanos de 1 a 2 das hbiles para completar [address]  Los precios de los medicamentos varan con frecuencia dependiendo del Environmental consultant de dnde se surte la receta y alguna farmacias pueden ofrecer precios ms baratos.  El sitio web www.goodrx.com tiene cupones para medicamentos de Health and safety inspector. Los precios aqu no tienen  en cuenta lo que podra costar con la ayuda del seguro (puede ser ms barato con su seguro), pero el sitio web puede darle el precio si no Visual merchandiser.  - Puede imprimir el cupn correspondiente y llevarlo con su receta a la farmacia.  - Tambin puede pasar por nuestra oficina durante el horario de atencin regular y Education officer, museum una tarjeta de cupones de GoodRx.  - Si necesita que su receta se enve electrnicamente a una farmacia diferente, informe a nuestra oficina a travs de MyChart de [place] o por telfono llamando al [phone] y presione la opcin 4.

## 2023-05-28 NOTE — Progress Notes (Signed)
   Follow-Up Visit   Subjective  Sandra [name] is a 77 y.o. female who presents for the following: Melasma, face, used SM Hydroquinone 12%/kojic acid/vitamin C cream x 40m then d/c x 2m, when restarted in October broke face out The patient has spots, moles and lesions to be evaluated, some may be new or changing and the patient may have concern these could be cancer.   The following portions of the chart were reviewed this encounter and updated as appropriate: medications, allergies, medical history  Review of Systems:  No other skin or systemic complaints except as noted in HPI or Assessment and Plan.  Objective  Well appearing patient in no apparent distress; mood and affect are within normal limits.   A focused examination was performed of the following areas: face  Relevant exam findings are noted in the Assessment and Plan.          Assessment & Plan   MELASMA face Exam: reticulated hyperpigmented patches at face  Chronic and persistent condition with duration or expected duration over one year. Condition is improving with treatment but not currently at goal.   Melasma is a chronic; persistent condition of hyperpigmented patches generally on the face, worse in summer due to higher UV exposure.    Heredity; thyroid disease; sun exposure; pregnancy; birth control pills; epilepsy medication and darker skin may predispose to Melasma.   Recommendations include: - Sun avoidance and daily broad spectrum (UVA/UVB) tinted mineral sunscreen SPF 30+, with Zinc or Titanium Dioxide. - Rx topical bleaching creams (i.e. hydroquinone) is a common treatment but should not be used long term.  Hydroquinones may be mixed with retinoids; vitamin C; steroids; Kojic Acid. - Alastin A-luminate, retinoids, vitamin C, topical tranexamic acid, glycolic acid and kojic acid can be used for brightening while on break from hydroquinone - Rx Azelaic Acid is also a treatment option that is  safe for pregnancy (Category B). - OTC Heliocare can be helpful in control and prevention. - Oral Rx with Tranexamic Acid 250 mg - 650 mg po daily can be used for moderate to severe cases especially during summer (contraindications include pregnancy; lactation; hx of PE; hx of DVT; clotting disorder; heart disease; anticoagulant use and upcoming long trips)   - Chemical peels (would need multiple for best result).  - Lasers and  Microdermabrasion may also be helpful adjunct treatments.  Treatment Plan: Restart SM Hydroquinone 12%/kojic acid/vitamin C cream bid to dark spots on face for 3 months then d/c for 3 months Discussed Alastin A-Luminate Brightening Serum if pt has issues with breaking out from SM Hydroquinone. Cont SPF to face, recommend Cerave or Cetaphil moisturizer with SPF     Return in about 1 year (around 05/27/2024) for melasma.  I, Ardis Rowan, RMA, am acting as scribe for Armida Sans, MD .   Documentation: I have reviewed the above documentation for accuracy and completeness, and I agree with the above.  Armida Sans, MD

## 2023-05-30 ENCOUNTER — Ambulatory Visit (INDEPENDENT_AMBULATORY_CARE_PROVIDER_SITE_OTHER): Payer: Medicare PPO | Admitting: Nurse Practitioner

## 2023-05-30 ENCOUNTER — Encounter: Payer: Self-pay | Admitting: Nurse Practitioner

## 2023-05-30 VITALS — BP 140/80 | HR 67 | Temp 98.2°F | Ht 61.25 in | Wt 121.6 lb

## 2023-05-30 DIAGNOSIS — G620 Drug-induced polyneuropathy: Secondary | ICD-10-CM | POA: Diagnosis not present

## 2023-05-30 DIAGNOSIS — M7711 Lateral epicondylitis, right elbow: Secondary | ICD-10-CM

## 2023-05-30 DIAGNOSIS — E785 Hyperlipidemia, unspecified: Secondary | ICD-10-CM

## 2023-05-30 DIAGNOSIS — Z0000 Encounter for general adult medical examination without abnormal findings: Secondary | ICD-10-CM

## 2023-05-30 DIAGNOSIS — Z23 Encounter for immunization: Secondary | ICD-10-CM

## 2023-05-30 DIAGNOSIS — T451X5A Adverse effect of antineoplastic and immunosuppressive drugs, initial encounter: Secondary | ICD-10-CM | POA: Diagnosis not present

## 2023-05-30 DIAGNOSIS — I10 Essential (primary) hypertension: Secondary | ICD-10-CM | POA: Diagnosis not present

## 2023-05-30 LAB — LIPID PANEL
Cholesterol: 189 mg/dL (ref 0–200)
HDL: 84.9 mg/dL (ref >39.00)
LDL Cholesterol: 95 mg/dL (ref 0–99)
NonHDL: 103.77
Total CHOL/HDL Ratio: 2
Triglycerides: 44 mg/dL (ref 0.0–149.0)
VLDL: 8.8 mg/dL (ref 0.0–40.0)

## 2023-05-30 LAB — COMPREHENSIVE METABOLIC PANEL
ALT: 16 U/L (ref 0–35)
AST: 24 U/L (ref 0–37)
Albumin: 4.1 g/dL (ref 3.5–5.2)
Alkaline Phosphatase: 51 U/L (ref 39–117)
BUN: 13 mg/dL (ref 6–23)
CO2: 30 meq/L (ref 19–32)
Calcium: 9.3 mg/dL (ref 8.4–10.5)
Chloride: 104 meq/L (ref 96–112)
Creatinine, Ser: 0.9 mg/dL (ref 0.40–1.20)
GFR: 61.31 mL/min (ref >60.00)
Glucose, Bld: 99 mg/dL (ref 70–99)
Potassium: 4.1 meq/L (ref 3.5–5.1)
Sodium: 142 meq/L (ref 135–145)
Total Bilirubin: 0.7 mg/dL (ref 0.2–1.2)
Total Protein: 6.5 g/dL (ref 6.0–8.3)

## 2023-05-30 LAB — CBC
HCT: 38.6 % (ref 36.0–46.0)
Hemoglobin: 12.4 g/dL (ref 12.0–15.0)
MCHC: 32.2 g/dL (ref 30.0–36.0)
MCV: 91 fL (ref 78.0–100.0)
Platelets: 295 10*3/uL (ref 150.0–400.0)
RBC: 4.25 Mil/uL (ref 3.87–5.11)
RDW: 12.8 % (ref 11.5–15.5)
WBC: 2.5 10*3/uL — ABNORMAL LOW (ref 4.0–10.5)

## 2023-05-30 LAB — TSH: TSH: 1.39 u[IU]/mL (ref 0.35–5.50)

## 2023-05-30 MED ORDER — LISINOPRIL 10 MG PO TABS: 10.0000 mg | ORAL_TABLET | Freq: Every day | ORAL | 3 refills | Status: DC

## 2023-05-30 MED ORDER — AMLODIPINE BESYLATE 5 MG PO TABS: 5.0000 mg | ORAL_TABLET | Freq: Every day | ORAL | 3 refills | Status: DC

## 2023-05-30 MED ORDER — GABAPENTIN 300 MG PO CAPS: 300.0000 mg | ORAL_CAPSULE | Freq: Three times a day (TID) | ORAL | 3 refills | Status: DC

## 2023-05-30 NOTE — Assessment & Plan Note (Signed)
Currently maintained on gabapentin 300 mg 3 times daily.  Continue

## 2023-05-30 NOTE — Patient Instructions (Addendum)
Nice to see you today I will be in touch with the labs once I have them Follow up with me in 1 year, sooner if you need me   You can use voltaren gel and and elbow strap to help with the discomfort   Find out If you have had Prevnar 20 that is the latest pneumonia vaccine

## 2023-05-30 NOTE — Assessment & Plan Note (Signed)
Discussed age-appropriate immunizations and screening exams.  Did review patient's personal, surgical, social, family histories.  Patient is up-to-date on all age-appropriate vaccinations she would like.  Update flu vaccine today.  Patient up-to-date on CRC screening.  Breast cancer screening.  Patient is aged out of cervical cancer screening.  Patient was given information at discharge about preventative healthcare maintenance with anticipatory guidance

## 2023-05-30 NOTE — Progress Notes (Signed)
Established Patient Office Visit  Subjective   Patient ID: Sandra [name], female    DOB: 1946/04/28  Age: 77 y.o. MRN: [id number]  Chief Complaint  Patient presents with   Annual Exam    Would like flu shot.    Medication Refill    Pt needs refill for all medications.       HTN: Patient currently maintained on amlodipine and lisinopril. Waymon Amato will check it at home. Tolerates medications well without side effect.  Neuropathy: Patient currently maintained on gabapentin 300 mg 3 times daily.  She does take the medication but does not feel like it is that effective.  Is worse in the lower extremities in his upper extremities  Breast CA: Patient is currently followed by Dr. Donneta Romberg and currently on anastrozole.  Patient follows up with him every 6 months and he manages mammograms  for complete physical and follow up of chronic conditions.  Immunizations: -Tetanus: Completed in 2020 -Influenza: Update today -Shingles: Completed Shingrix series -Pneumonia: Completed needs prevnar 20, unsure if she has had this she will check with the pharmacy  Diet: Fair diet. 2 meals a day with snacking rarely ( she will do a smoothie). Water and tea and sweetened  Exercise: Walk ( was daily) now three times a week  and work in the yard   [place] exam: Completes annually. Wears glasses   Dental exam: Completes semi-annually    Colonoscopy: Completed in 12/18/2022 repeat screening in 2025 Lung Cancer Screening: N/A  Pap smear: Aged out  Mammogram: 07/2023 with Dr B  Dexa: 07/2023  Sleep: states she will go to bed aroudn 11 and get up around 7am. Feels rested and ready to go         Review of Systems  Constitutional:  Negative for chills and fever.  Respiratory:  Negative for shortness of breath.   Cardiovascular:  Negative for chest pain and leg swelling.  Gastrointestinal:  Negative for abdominal pain, blood in stool, constipation, diarrhea, nausea and vomiting.        BM daily   Genitourinary:  Negative for dysuria and hematuria.  Neurological:  Positive for tingling. Negative for headaches.  Psychiatric/Behavioral:  Negative for hallucinations and suicidal ideas.       Objective:     BP (!) 140/80   Pulse 67   Temp 98.2 F (36.8 C) (Oral)   Ht 5' 1.25" (1.556 m)   Wt 121 lb 9.6 oz (55.2 kg)   LMP  (LMP Unknown)   SpO2 98%   BMI 22.79 kg/m  BP Readings from Last 3 Encounters:  05/30/23 (!) 140/80  05/28/23 (!) 142/73  03/11/23 (!) 153/75   Wt Readings from Last 3 Encounters:  05/30/23 121 lb 9.6 oz (55.2 kg)  03/11/23 121 lb 12.8 oz (55.2 kg)  12/18/22 116 lb (52.6 kg)   SpO2 Readings from Last 3 Encounters:  05/30/23 98%  03/11/23 100%  12/18/22 100%      Physical Exam Vitals and nursing note reviewed.  Constitutional:      Appearance: Normal appearance.  Cardiovascular:     Rate and Rhythm: Normal rate and regular rhythm.     Heart sounds: Normal heart sounds.  Pulmonary:     Effort: Pulmonary effort is normal.     Breath sounds: Normal breath sounds.  Musculoskeletal:        General: Tenderness present.     Right elbow: Tenderness present in lateral epicondyle.  Neurological:     Mental Status:  She is alert.      No results found for any visits on 05/30/23.    The 10-year ASCVD risk score (Arnett DK, et al., 2019) is: 22.5%    Assessment & Plan:   Problem List Items Addressed This Visit       Cardiovascular and Mediastinum   Hypertension    Blood pressure controlled for age.  Continue lisinopril 10 mg and amlodipine 5 mg daily.      Relevant Medications   amLODipine (NORVASC) 5 MG tablet   lisinopril (ZESTRIL) 10 MG tablet   Other Relevant Orders   CBC   Comprehensive metabolic panel   TSH     Nervous and Auditory   Chemotherapy-induced neuropathy (HCC)    Currently maintained on gabapentin 300 mg 3 times daily.  Continue      Relevant Medications   gabapentin (NEURONTIN) 300 MG capsule      Musculoskeletal and Integument   Lateral epicondylitis of right elbow    Patient can use over-the-counter analgesics including Voltaren gel.  Patient can also use a elbow band for offloading.  Rest is much as possible follow-up if no improvement         Other   Hyperlipidemia    History of same pending lipid panel today      Relevant Medications   amLODipine (NORVASC) 5 MG tablet   lisinopril (ZESTRIL) 10 MG tablet   Other Relevant Orders   Lipid panel   Preventative health care - Primary    Discussed age-appropriate immunizations and screening exams.  Did review patient's personal, surgical, social, family histories.  Patient is up-to-date on all age-appropriate vaccinations she would like.  Update flu vaccine today.  Patient up-to-date on CRC screening.  Breast cancer screening.  Patient is aged out of cervical cancer screening.  Patient was given information at discharge about preventative healthcare maintenance with anticipatory guidance      Relevant Orders   CBC   Comprehensive metabolic panel   TSH   Other Visit Diagnoses     Need for influenza vaccination       Relevant Orders   Flu Vaccine Trivalent High Dose (Fluad) (Completed)       Return in about 1 year (around 05/29/2024) for CPE and Labs.    Audria Nine, NP

## 2023-05-30 NOTE — Assessment & Plan Note (Signed)
History of same pending lipid panel today

## 2023-05-30 NOTE — Assessment & Plan Note (Signed)
Blood pressure controlled for age.  Continue lisinopril 10 mg and amlodipine 5 mg daily.

## 2023-05-30 NOTE — Assessment & Plan Note (Signed)
Patient can use over-the-counter analgesics including Voltaren gel.  Patient can also use a elbow band for offloading.  Rest is much as possible follow-up if no improvement

## 2023-06-03 ENCOUNTER — Ambulatory Visit (INDEPENDENT_AMBULATORY_CARE_PROVIDER_SITE_OTHER): Payer: Medicare PPO

## 2023-06-03 ENCOUNTER — Ambulatory Visit
Admission: EM | Admit: 2023-06-03 | Discharge: 2023-06-03 | Disposition: A | Discharge status: Home / Self Care | Payer: Medicare PPO | Attending: Emergency Medicine | Admitting: Emergency Medicine

## 2023-06-03 DIAGNOSIS — M19042 Primary osteoarthritis, left hand: Secondary | ICD-10-CM | POA: Diagnosis not present

## 2023-06-03 DIAGNOSIS — I10 Essential (primary) hypertension: Secondary | ICD-10-CM | POA: Diagnosis not present

## 2023-06-03 DIAGNOSIS — M79645 Pain in left finger(s): Secondary | ICD-10-CM | POA: Diagnosis not present

## 2023-06-03 DIAGNOSIS — S60012A Contusion of left thumb without damage to nail, initial encounter: Secondary | ICD-10-CM | POA: Diagnosis not present

## 2023-06-03 NOTE — Discharge Instructions (Addendum)
The x-ray of your thumb is pending.  I will call you with the results.    Follow-up with an orthopedist such as the one listed below.    Your blood pressure is elevated today at 180/80; repeat 155/82.  Please have this rechecked by your primary care provider this week.

## 2023-06-03 NOTE — ED Provider Notes (Signed)
Sandra [name]    CSN: [id number] Arrival date & time: 06/03/23  1528      History   Chief Complaint Chief Complaint  Patient presents with   Finger Injury    HPI Sandra [name] is a 77 y.o. female.  Patient presents with left thumb pain, swelling, bruising since she was working outside in her yard today.  No known trauma.  No open wounds, drainage, fever, numbness, weakness, or other symptoms.  Treating with Tylenol.  Her medical history includes hypertension, breast cancer, neuropathy.  The history is provided by the patient and medical records.    Past Medical History:  Diagnosis Date   Breast cancer (HCC) 07/13/2015   RIGHT lumpectomy 07/13/15, pT1c, pN0,M0 Stage 1 Er/PR pos, her 2 neg. 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, due to chemotherapy   Personal history of chemotherapy 2016   Personal history of radiation therapy 2016   Visual blurriness    SOMETHING NEW THAT HAS DEVELOPED SINCE 07-13-15 SURGERY- SEEN AT [facility] ENT AND EXAM WAS NORMAL PER PT (08-09-15)    Patient Active Problem List   Diagnosis Date Noted   Lateral epicondylitis of right elbow 05/30/2023   Change in bowel habits 12/18/2022   Erythema of rectum 12/18/2022   Flatus 07/10/2022   Bloating 07/10/2022   Change in stool 07/10/2022   Preventative health care 05/29/2021   Sciatica of left side 02/28/2021   Establishing care with new doctor, encounter for 02/28/2021   Carcinoma of upper-inner quadrant of right breast in female, estrogen receptor positive (HCC) 01/31/2016   Chemotherapy-induced neuropathy (HCC) 01/17/2016   Medicare annual wellness visit, subsequent 04/06/2014   Hypertension    Hyperlipidemia     Past Surgical History:  Procedure Laterality Date   ABDOMINAL HYSTERECTOMY  1987   BREAST BIOPSY Right    neg   BREAST BIOPSY Left    4 oclock, FIBROEPITHELIAL LESION FAVOR CELLULAR FIBROADENOMA    BREAST BIOPSY Left    2 oclock, CYSTIC APOCRINE METAPLASIA   BREAST BIOPSY Right 07/13/15   130 INVASIVE MAMMARY CARCINOMA, NO SPECIAL TYPE   BREAST EXCISIONAL BIOPSY Left 2016   fibroadenoma   BREAST LUMPECTOMY Left 07/13/2015   BREAST LUMPECTOMY WITH SENTINEL LYMPH NODE BIOPSY Right 07/13/2015   Procedure: BREAST LUMPECTOMY WITH SENTINEL LYMPH NODE BX;  Surgeon: Kieth Brightly, MD;  Location: [facility] ORS;  Service: General;  Laterality: Right;   BUNIONECTOMY Right 1993   CATARACT EXTRACTION W/PHACO Left 09/17/2016   Procedure: CATARACT EXTRACTION PHACO AND INTRAOCULAR LENS PLACEMENT (IOC);  Surgeon: Galen Manila, MD;  Location: [facility] ORS;  Service: Ophthalmology;  Laterality: Left;  [place] 00:47AP% 17.6CDE 8.39Fluid pack lot # [id number]   CATARACT EXTRACTION W/PHACO Right 01/21/2017   Procedure: CATARACT EXTRACTION PHACO AND INTRAOCULAR LENS PLACEMENT (IOC);  Surgeon: Galen Manila, MD;  Location: [facility] ORS;  Service: Ophthalmology;  Laterality: Right;  [place] 00:39 AP% 13.0 CDE 5.16 fluid pack lot # [id number]   COLONOSCOPY  2015   COLONOSCOPY WITH PROPOFOL N/A 12/18/2022   Procedure: COLONOSCOPY WITH PROPOFOL;  Surgeon: Toney Reil, MD;  Location: [facility] ENDOSCOPY;  Service: Gastroenterology;  Laterality: N/A;   fatty tumor Left 2013   side   PORT-A-CATH REMOVAL  2017   PORTACATH PLACEMENT Left 08/16/2015   Procedure: INSERTION PORT-A-CATH;  Surgeon: Kieth Brightly, MD;  Location: [facility] ORS;  Service: General;  Laterality: Left;  OB History   No obstetric history on file.      Home Medications    Prior to Admission medications   Medication Sig Start Date End Date Taking? Authorizing Provider  amLODipine (NORVASC) 5 MG tablet Take 1 tablet (5 mg total) by mouth daily. 05/30/23   Eden Emms, NP  anastrozole (ARIMIDEX) 1 MG tablet Take 1 tablet (1 mg total) by mouth daily. 03/11/23   Earna Coder, MD  b complex vitamins tablet Take 1 tablet by mouth daily.     [provider]  BIOTIN PO Take 5,000 mcg by mouth daily.    [provider]  CALCIUM PO Take 1,000 mg by mouth daily.    [provider]  COD LIVER OIL/VITAMINS A & D CAPS Take by mouth.    [provider]  Flaxseed, Linseed, (FLAX SEEDS PO) Take by mouth daily.    [provider]  gabapentin (NEURONTIN) 300 MG capsule Take 1 capsule (300 mg total) by mouth 3 (three) times daily. 05/30/23   Eden Emms, NP  Garlic 1000 MG CAPS Take by mouth.    [provider]  lisinopril (ZESTRIL) 10 MG tablet Take 1 tablet (10 mg total) by mouth daily. 05/30/23   Eden Emms, NP  polyethylene glycol powder (GLYCOLAX/MIRALAX) 17 GM/SCOOP powder Take 1 Container by mouth daily.    [provider]  Potassium 99 MG TABS Take 99 mg by mouth daily.     [provider]    Family History Family History  Problem Relation Age of Onset   Cancer Sister 33       breast cancer   Breast cancer Sister 63   Colon cancer Neg Hx     Social History Social History   Tobacco Use   Smoking status: Never   Smokeless tobacco: Never  Vaping Use   Vaping status: Never Used  Substance Use Topics   Alcohol use: No    Alcohol/week: 0.0 standard drinks of alcohol   Drug use: No     Allergies   Cymbalta [duloxetine hcl] and Lovastatin   Review of Systems Review of Systems  Constitutional:  Negative for chills and fever.  Respiratory:  Negative for cough and shortness of breath.   Cardiovascular:  Negative for chest pain and palpitations.  Musculoskeletal:  Positive for arthralgias and joint swelling.  Skin:  Positive for color change. Negative for wound.  Neurological:  Negative for weakness and numbness.     Physical Exam Triage Vital Signs ED Triage Vitals  Encounter Vitals Group     BP --      Systolic BP Percentile --      Diastolic BP Percentile --      Pulse Rate 06/03/23 1634 74     Resp 06/03/23 1634 18     Temp  06/03/23 1634 98.7 F (37.1 C)     Temp src --      SpO2 06/03/23 1634 99 %     Weight --      Height --      Head Circumference --      Peak Flow --      Pain Score 06/03/23 1628 0     Pain Loc --      Pain Education --      Exclude from Growth Chart --    No data found.  Updated Vital Signs BP (!) 155/82   Pulse 74   Temp 98.7 F (37.1 C)  Resp 18   LMP  (LMP Unknown)   SpO2 99%   Visual Acuity Right Eye Distance:   Left Eye Distance:   Bilateral Distance:    Right Eye Near:   Left Eye Near:    Bilateral Near:     Physical Exam Constitutional:      General: She is not in acute distress. HENT:     Mouth/Throat:     Mouth: Mucous membranes are moist.  Cardiovascular:     Rate and Rhythm: Normal rate and regular rhythm.     Heart sounds: Normal heart sounds.  Pulmonary:     Effort: Pulmonary effort is normal. No respiratory distress.     Breath sounds: Normal breath sounds.  Musculoskeletal:        General: Swelling and tenderness present. No deformity. Normal range of motion.  Skin:    Capillary Refill: Capillary refill takes less than 2 seconds.     Findings: Bruising present. No erythema or lesion.  Neurological:     General: No focal deficit present.     Mental Status: She is alert and oriented to person, place, and time.     Sensory: No sensory deficit.     Motor: No weakness.      UC Treatments / Results  Labs (all labs ordered are listed, but only abnormal results are displayed) Labs Reviewed - No data to display  EKG   Radiology DG Finger Thumb Left  Result Date: 06/03/2023 CLINICAL DATA:  Left-sided thumb bruising EXAM: LEFT THUMB 2+V COMPARISON:  None Available. FINDINGS: No fracture or malalignment. Mild degenerative change at the first IP and [facility] joint. Soft tissues are unremarkable IMPRESSION: No acute osseous abnormality. Electronically Signed   By: Jasmine Pang M.D.   On: 06/03/2023 19:21    Procedures Procedures (including  critical care time)  Medications Ordered in UC Medications - No data to display  Initial Impression / Assessment and Plan / UC Course  I have reviewed the triage vital signs and the nursing notes.  Pertinent labs & imaging results that were available during my care of the patient were reviewed by me and considered in my medical decision making (see chart for details).    Left thumb pain.  Elevated blood pressure reading with hypertension.  X-ray negative.  Treating with thumb splint, Tylenol, rest, elevation.  Instructed patient to follow-up with her PCP or an orthopedist.  Contact information for on-call Ortho provided.  Also discussed with patient that her blood pressure is elevated today and needs to be rechecked by PCP this week.  Education provided on managing hypertension.  She agrees to plan of care.    Final Clinical Impressions(s) / UC Diagnoses   Final diagnoses:  Thumb pain, left  Elevated blood pressure reading in office with diagnosis of hypertension     Discharge Instructions      The x-ray of your thumb is pending.  I will call you with the results.    Follow-up with an orthopedist such as the one listed below.    Your blood pressure is elevated today at 180/80; repeat 155/82.  Please have this rechecked by your primary care provider this week.            ED Prescriptions   None    PDMP not reviewed this encounter.   Mickie Bail, NP 06/03/23 [phone]

## 2023-06-03 NOTE — ED Triage Notes (Addendum)
Patient to Urgent Care with complaints of left sided thumb bruising.  Symptoms started today. Denies any known injury. 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 Took some tylenol PTA.

## 2023-06-04 ENCOUNTER — Telehealth: Payer: Self-pay | Admitting: Nurse Practitioner

## 2023-06-04 NOTE — Telephone Encounter (Signed)
Patient called in returning a call she received. Relayed lab results to patient. She stated that there no message left on who call and I seen no documentation. Informed patient if someone called they will try to call again. Thank yoU!

## 2023-06-08 ENCOUNTER — Encounter: Payer: Self-pay | Admitting: Dermatology

## 2023-07-24 ENCOUNTER — Ambulatory Visit
Admission: RE | Admit: 2023-07-24 | Discharge: 2023-07-24 | Disposition: A | Discharge status: Home / Self Care | Payer: Medicare PPO | Source: Ambulatory Visit | Attending: Internal Medicine | Admitting: Internal Medicine

## 2023-07-24 DIAGNOSIS — Z170 Estrogen receptor positive status [ER+]: Secondary | ICD-10-CM | POA: Insufficient documentation

## 2023-07-24 DIAGNOSIS — M8589 Other specified disorders of bone density and structure, multiple sites: Secondary | ICD-10-CM | POA: Diagnosis not present

## 2023-07-24 DIAGNOSIS — C50211 Malignant neoplasm of upper-inner quadrant of right female breast: Secondary | ICD-10-CM | POA: Insufficient documentation

## 2023-07-24 DIAGNOSIS — Z780 Asymptomatic menopausal state: Secondary | ICD-10-CM | POA: Diagnosis not present

## 2023-07-24 DIAGNOSIS — M85851 Other specified disorders of bone density and structure, right thigh: Secondary | ICD-10-CM | POA: Diagnosis not present

## 2023-07-24 DIAGNOSIS — Z1231 Encounter for screening mammogram for malignant neoplasm of breast: Secondary | ICD-10-CM | POA: Diagnosis not present

## 2023-09-01 DIAGNOSIS — H6123 Impacted cerumen, bilateral: Secondary | ICD-10-CM | POA: Diagnosis not present

## 2023-09-01 DIAGNOSIS — H9313 Tinnitus, bilateral: Secondary | ICD-10-CM | POA: Diagnosis not present

## 2023-09-09 ENCOUNTER — Encounter: Payer: Self-pay | Admitting: Internal Medicine

## 2023-09-09 ENCOUNTER — Inpatient Hospital Stay: Payer: Medicare PPO | Attending: Internal Medicine

## 2023-09-09 ENCOUNTER — Inpatient Hospital Stay: Payer: Medicare PPO | Admitting: Internal Medicine

## 2023-09-09 DIAGNOSIS — C50212 Malignant neoplasm of upper-inner quadrant of left female breast: Secondary | ICD-10-CM | POA: Diagnosis not present

## 2023-09-09 DIAGNOSIS — G629 Polyneuropathy, unspecified: Secondary | ICD-10-CM | POA: Diagnosis not present

## 2023-09-09 DIAGNOSIS — C50211 Malignant neoplasm of upper-inner quadrant of right female breast: Secondary | ICD-10-CM | POA: Diagnosis not present

## 2023-09-09 DIAGNOSIS — Z79811 Long term (current) use of aromatase inhibitors: Secondary | ICD-10-CM | POA: Insufficient documentation

## 2023-09-09 DIAGNOSIS — Z170 Estrogen receptor positive status [ER+]: Secondary | ICD-10-CM | POA: Insufficient documentation

## 2023-09-09 DIAGNOSIS — Z803 Family history of malignant neoplasm of breast: Secondary | ICD-10-CM | POA: Diagnosis not present

## 2023-09-09 DIAGNOSIS — Z95828 Presence of other vascular implants and grafts: Secondary | ICD-10-CM

## 2023-09-09 DIAGNOSIS — M8580 Other specified disorders of bone density and structure, unspecified site: Secondary | ICD-10-CM | POA: Insufficient documentation

## 2023-09-09 LAB — CMP (CANCER CENTER ONLY)
ALT: 20 U/L (ref 0–44)
AST: 27 U/L (ref 15–41)
Albumin: 3.8 g/dL (ref 3.5–5.0)
Alkaline Phosphatase: 47 U/L (ref 38–126)
Anion gap: 10 (ref 5–15)
BUN: 14 mg/dL (ref 8–23)
CO2: 27 mmol/L (ref 22–32)
Calcium: 9.1 mg/dL (ref 8.9–10.3)
Chloride: 102 mmol/L (ref 98–111)
Creatinine: 0.94 mg/dL (ref 0.44–1.00)
GFR, Estimated: >60 mL/min (ref >60)
Glucose, Bld: 109 mg/dL — ABNORMAL HIGH (ref 70–99)
Potassium: 3.5 mmol/L (ref 3.5–5.1)
Sodium: 139 mmol/L (ref 135–145)
Total Bilirubin: 0.8 mg/dL (ref 0.0–1.2)
Total Protein: 6.9 g/dL (ref 6.5–8.1)

## 2023-09-09 LAB — CBC WITH DIFFERENTIAL (CANCER CENTER ONLY)
Abs Immature Granulocytes: 0 10*3/uL (ref 0.00–0.07)
Basophils Absolute: 0 10*3/uL (ref 0.0–0.1)
Basophils Relative: 2 %
Eosinophils Absolute: 0.1 10*3/uL (ref 0.0–0.5)
Eosinophils Relative: 4 %
HCT: 38.1 % (ref 36.0–46.0)
Hemoglobin: 12.4 g/dL (ref 12.0–15.0)
Immature Granulocytes: 0 %
Lymphocytes Relative: 39 %
Lymphs Abs: 1 10*3/uL (ref 0.7–4.0)
MCH: 29.1 pg (ref 26.0–34.0)
MCHC: 32.5 g/dL (ref 30.0–36.0)
MCV: 89.4 fL (ref 80.0–100.0)
Monocytes Absolute: 0.2 10*3/uL (ref 0.1–1.0)
Monocytes Relative: 9 %
Neutro Abs: 1.2 10*3/uL — ABNORMAL LOW (ref 1.7–7.7)
Neutrophils Relative %: 46 %
Platelet Count: 298 10*3/uL (ref 150–400)
RBC: 4.26 MIL/uL (ref 3.87–5.11)
RDW: 12.3 % (ref 11.5–15.5)
WBC Count: 2.6 10*3/uL — ABNORMAL LOW (ref 4.0–10.5)
nRBC: 0 % (ref 0.0–0.2)

## 2023-09-09 LAB — VITAMIN D 25 HYDROXY (VIT D DEFICIENCY, FRACTURES): Vit D, 25-Hydroxy: 91.24 ng/mL (ref 30–100)

## 2023-09-09 MED ORDER — ANASTROZOLE 1 MG PO TABS
1.0000 mg | ORAL_TABLET | Freq: Every day | ORAL | 1 refills | Status: DC
Start: 2023-09-09 — End: 2024-03-08

## 2023-09-09 NOTE — Assessment & Plan Note (Addendum)
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  Stable.  Tolerating very well; DEC 2024-Mammo-WNL.   # No clinical evidence of recurrence.  Continue anastrozole  at this time.  Tolerating without any major side effects.  Stable.    # Osteopenia- BMD- FEB 2022- T score= -.1.9 [2020: T score-1.9]; Continue calcium and vitamin D ; DEC 2024- T-score of -1.7.Continue exercise/walking 3 miles/day-   stable.   # Chronic benign ethnic neutropenia /mild leucopenia- 2.3/ ANC-1.3 --Stable.    # Grade 2 neuropathy-Neurontin  300 mg TID- stable   # DISPOSITION:  # follow up in 6 months-MD/labs- cbc/cmp; vit D 25 levels -Dr.B.

## 2023-09-09 NOTE — Progress Notes (Signed)
 Refill anastrozole, pended.

## 2023-09-09 NOTE — Progress Notes (Signed)
 Cancer Center @ [facility] Telephone:(336) [phone]  Fax:([phone]   INITIAL CONSULT  Sandra [name] OB: Sandra [name] 11, 1947  MR#: [id number]  RDW#:[id number]  Patient Care Team: Wendee Lynwood HERO, NP as PCP - General (Family Medicine) Sandra [name] BIRCH, PA-C (Physician Assistant) Sandra, [name] HERO, MD as Consulting Physician (Gastroenterology) Jenetta Holmes HERO, MD (Inactive) as Consulting Physician (Family Medicine) Sandra Louanne MATSU, MD (General Surgery) Sandra Cindy SAUNDERS, MD as Consulting Physician (Internal Medicine) Sandra Raoul NOVAK, RN as Oncology Nurse Navigator Sandra Aran, MD as Referring Physician (Radiation Oncology)  CHIEF COMPLAINT:  Chief Complaint  Patient presents with   Follow-up    Carcinoma of upper-inner quadrant of right breast in female, estrogen receptor positive (HCC)   VISIT DIAGNOSIS:     ICD-10-CM   1. Port-A-Cath in place  Z95.828 anastrozole  (ARIMIDEX ) 1 MG tablet    2. Carcinoma of upper-inner quadrant of right breast in female, estrogen receptor positive (HCC)  C50.211 anastrozole  (ARIMIDEX ) 1 MG tablet   Z17.0 CBC with Differential (Cancer Center Only)    CMP (Cancer Center only)    VITAMIN D  25 Hydroxy (Vit-D Deficiency, Fractures)       Oncology History Overview Note   carcinoma breast (right) inner and upper quadrant T1 cN0 M0 tumor Estrogen receptor positive progesterone receptor positive HER-2 receptor negative by fish Mammo print shows high risk (diagnosis in January of 2016) patient has 22% average risk in 5 years and 29% average risk in 10 years for distant metastectomy disease without chemotherapy on anti-hormonal treatment 2.  Patient started Cytoxan  and Adriamycin  on now August 30, 2015 MUGA scan shows ejection fraction of baseline 55%.  3.Patient has finished total of 4 cycles of chemotherapy on November 01, 2015 4.  Started on the Taxol  from November 22, 2015; FINISHED RT [Sandra [name] 2017] # OCT 2017- START ARIMIDEX    # NOV 2021 BMD- T  score= - 1.9 -OSTEOPENIA- ca+vit D  # SURVIVORSHIP: O  --------------------------------------------------------   DIAGNOSIS: RIGHT BREAST CA  STAGE: I        ;GOALS: cure  CURRENT/MOST RECENT THERAPY: on anastrazole      Carcinoma of upper-inner quadrant of right breast in female, estrogen receptor positive (HCC)   INTERVAL HISTORY: Alone.  Ambulating independently.  78 year old lady History of stage I right-sided breast cancer ER/PR positive currently on Arimidex  is here for follow-up/ and review the results of mammogram/BMD  Taking anastrozole  daily. No side effects. Energy is very good. No concerns today.    Patient denies any worsening joint pains or bone pain.  Patient has intermittent cramps in the legs.  Otherwise denies any worsening tingling or numbness.  She continues with gabapentin .   Review of Systems  Constitutional:  Negative for chills, diaphoresis, fever, malaise/fatigue and weight loss.  HENT:  Negative for nosebleeds and sore throat.   Eyes:  Negative for double vision.  Respiratory:  Negative for cough, hemoptysis, sputum production, shortness of breath and wheezing.   Cardiovascular:  Negative for chest pain, palpitations, orthopnea and leg swelling.  Gastrointestinal:  Negative for abdominal pain, blood in stool, constipation, diarrhea, heartburn, melena, nausea and vomiting.  Genitourinary:  Negative for dysuria, frequency and urgency.  Musculoskeletal:  Negative for back pain and joint pain.  Skin: Negative.  Negative for itching and rash.  Neurological:  Positive for tingling. Negative for dizziness, focal weakness, weakness and headaches.  Endo/Heme/Allergies:  Does not bruise/bleed easily.  Psychiatric/Behavioral:  Negative for depression. The patient is not nervous/anxious and does not have  insomnia.      PAST MEDICAL HISTORY: Past Medical History:  Diagnosis Date   Breast cancer (HCC) 07/13/2015   RIGHT lumpectomy 07/13/15, pT1c, pN0,M0 Stage 1  Er/PR pos, her 2 neg. 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, due to chemotherapy   Personal history of chemotherapy 2016   Personal history of radiation therapy 2016   Visual blurriness    SOMETHING NEW THAT HAS DEVELOPED SINCE 07-13-15 SURGERY- SEEN AT [facility] ENT AND EXAM WAS NORMAL PER PT (08-09-15)    PAST SURGICAL HISTORY: Past Surgical History:  Procedure Laterality Date   ABDOMINAL HYSTERECTOMY  1987   BREAST BIOPSY Right    neg   BREAST BIOPSY Left    4 oclock, FIBROEPITHELIAL LESION FAVOR CELLULAR FIBROADENOMA   BREAST BIOPSY Left    2 oclock, CYSTIC APOCRINE METAPLASIA   BREAST BIOPSY Right 07/13/15   130 INVASIVE MAMMARY CARCINOMA, NO SPECIAL TYPE   BREAST EXCISIONAL BIOPSY Left 2016   fibroadenoma   BREAST LUMPECTOMY Left 07/13/2015   BREAST LUMPECTOMY WITH SENTINEL LYMPH NODE BIOPSY Right 07/13/2015   Procedure: BREAST LUMPECTOMY WITH SENTINEL LYMPH NODE BX;  Surgeon: Louanne KANDICE Muse, MD;  Location: [facility] ORS;  Service: General;  Laterality: Right;   BUNIONECTOMY Right 1993   CATARACT EXTRACTION W/PHACO Left 09/17/2016   Procedure: CATARACT EXTRACTION PHACO AND INTRAOCULAR LENS PLACEMENT (IOC);  Surgeon: Elsie Carmine, MD;  Location: [facility] ORS;  Service: Ophthalmology;  Laterality: Left;  US  00:47AP% 17.6CDE 8.39Fluid pack lot # [id number] H   CATARACT EXTRACTION W/PHACO Right 01/21/2017   Procedure: CATARACT EXTRACTION PHACO AND INTRAOCULAR LENS PLACEMENT (IOC);  Surgeon: Carmine Elsie, MD;  Location: [facility] ORS;  Service: Ophthalmology;  Laterality: Right;  US  00:39 AP% 13.0 CDE 5.16 fluid pack lot # [id number] H   COLONOSCOPY  2015   COLONOSCOPY WITH PROPOFOL  N/A 12/18/2022   Procedure: COLONOSCOPY WITH PROPOFOL ;  Surgeon: Unk Corinn Skiff, MD;  Location: [facility] ENDOSCOPY;  Service: Gastroenterology;  Laterality: N/A;   fatty tumor Left 2013   side   PORT-A-CATH REMOVAL  2017   PORTACATH PLACEMENT Left  08/16/2015   Procedure: INSERTION PORT-A-CATH;  Surgeon: Louanne KANDICE Muse, MD;  Location: [facility] ORS;  Service: General;  Laterality: Left;    FAMILY HISTORY Family History  Problem Relation Age of Onset   Cancer Sister 54       breast cancer   Breast cancer Sister 57   Colon cancer Neg Hx         ADVANCED DIRECTIVES:   Patient does not have any living will or healthcare power of attorney.  Information was given .  Available resources had been discussed.   HEALTH MAINTENANCE: Social History   Tobacco Use   Smoking status: Never   Smokeless tobacco: Never  Vaping Use   Vaping status: Never Used  Substance Use Topics   Alcohol use: No    Alcohol/week: 0.0 standard drinks of alcohol   Drug use: No      :  Allergies  Allergen Reactions   Cymbalta  [Duloxetine  Hcl] Other (See Comments)    Glaucoma.  Unable to take   Lovastatin Anaphylaxis    Tongue swelling    Current Outpatient Medications  Medication Sig Dispense Refill   amLODipine  (NORVASC ) 5 MG tablet Take 1 tablet (5 mg total) by mouth daily. 90 tablet 3   b complex vitamins tablet Take 1 tablet by mouth daily.  BIOTIN PO Take 5,000 mcg by mouth daily.     CALCIUM PO Take 1,000 mg by mouth daily.     COD LIVER OIL/VITAMINS A & D CAPS Take by mouth.     Flaxseed, Linseed, (FLAX SEEDS PO) Take by mouth daily.     gabapentin  (NEURONTIN ) 300 MG capsule Take 1 capsule (300 mg total) by mouth 3 (three) times daily. 270 capsule 3   Garlic 1000 MG CAPS Take by mouth.     lisinopril  (ZESTRIL ) 10 MG tablet Take 1 tablet (10 mg total) by mouth daily. 90 tablet 3   polyethylene glycol powder (GLYCOLAX/MIRALAX) 17 GM/SCOOP powder Take 1 Container by mouth daily.     Potassium 99 MG TABS Take 99 mg by mouth daily.      anastrozole  (ARIMIDEX ) 1 MG tablet Take 1 tablet (1 mg total) by mouth daily. 90 tablet 1   No current facility-administered medications for this visit.   Right and left BREAST exam [in the presence  of nurse]- no unusual skin changes or dominant masses felt. Surgical scars noted.    OBJECTIVE: Physical Exam HENT:     Head: Normocephalic and atraumatic.     Mouth/Throat:     Pharynx: No oropharyngeal exudate.  Eyes:     Pupils: Pupils are equal, round, and reactive to light.  Cardiovascular:     Rate and Rhythm: Normal rate and regular rhythm.  Pulmonary:     Effort: No respiratory distress.     Breath sounds: No wheezing.  Abdominal:     General: Bowel sounds are normal. There is no distension.     Palpations: Abdomen is soft. There is no mass.     Tenderness: There is no abdominal tenderness. There is no guarding or rebound.  Musculoskeletal:        General: No tenderness. Normal range of motion.     Cervical back: Normal range of motion and neck supple.  Skin:    General: Skin is warm.  Neurological:     Mental Status: She is alert and oriented to person, place, and time.  Psychiatric:        Mood and Affect: Affect normal.       Vitals:   09/09/23 1019  BP: 126/77  Pulse: 78  Temp: (!) 95.9 F (35.5 C)  SpO2: 100%     Body mass index is 23.09 kg/m.    ECOG FS:0 - Asymptomatic  LAB RESULTS:  Appointment on 09/09/2023  Component Date Value Ref Range Status   WBC Count 09/09/2023 2.6 (L)  4.0 - 10.5 K/uL Final   RBC 09/09/2023 4.26  3.87 - 5.11 MIL/uL Final   Hemoglobin 09/09/2023 12.4  12.0 - 15.0 g/dL Final   HCT 97/95/7974 38.1  36.0 - 46.0 % Final   MCV 09/09/2023 89.4  80.0 - 100.0 fL Final   MCH 09/09/2023 29.1  26.0 - 34.0 pg Final   MCHC 09/09/2023 32.5  30.0 - 36.0 g/dL Final   RDW 97/95/7974 12.3  11.5 - 15.5 % Final   Platelet Count 09/09/2023 298  150 - 400 K/uL Final   nRBC 09/09/2023 0.0  0.0 - 0.2 % Final   Neutrophils Relative % 09/09/2023 46  % Final   Neutro Abs 09/09/2023 1.2 (L)  1.7 - 7.7 K/uL Final   Lymphocytes Relative 09/09/2023 39  % Final   Lymphs Abs 09/09/2023 1.0  0.7 - 4.0 K/uL Final   Monocytes Relative 09/09/2023 9  %  Final  Monocytes Absolute 09/09/2023 0.2  0.1 - 1.0 K/uL Final   Eosinophils Relative 09/09/2023 4  % Final   Eosinophils Absolute 09/09/2023 0.1  0.0 - 0.5 K/uL Final   Basophils Relative 09/09/2023 2  % Final   Basophils Absolute 09/09/2023 0.0  0.0 - 0.1 K/uL Final   Immature Granulocytes 09/09/2023 0  % Final   Abs Immature Granulocytes 09/09/2023 0.00  0.00 - 0.07 K/uL Final   Performed at [facility], [address] [place], [place] [address]     STUDIES: No results found.  ASSESSMENT:   Carcinoma of upper-inner quadrant of right breast in female, estrogen receptor positive (HCC) # Stage I ER/PR positive HER-2/neu negative;  On Anastrazole on extended AI [until 2027].  Stable.  Tolerating very well; DEC 2024-Mammo-WNL.   # No clinical evidence of recurrence.  Continue anastrozole  at this time.  Tolerating without any major side effects.  Stable.    # Osteopenia- BMD- FEB 2022- T score= -.1.9 [2020: T score-1.9]; Continue calcium and vitamin D ; DEC 2024- T-score of -1.7.Continue exercise/walking 3 miles/day-   stable.   # Chronic benign ethnic neutropenia /mild leucopenia- 2.3/ ANC-1.3 --Stable.    # Grade 2 neuropathy-Neurontin  300 mg TID- stable   # DISPOSITION:  # follow up in 6 months-MD/labs- cbc/cmp; vit D 25 levels -Dr.B.    Cindy JONELLE Joe, MD   09/09/2023 10:59 AM

## 2023-12-31 DIAGNOSIS — Z961 Presence of intraocular lens: Secondary | ICD-10-CM | POA: Diagnosis not present

## 2023-12-31 DIAGNOSIS — H40039 Anatomical narrow angle, unspecified eye: Secondary | ICD-10-CM | POA: Diagnosis not present

## 2023-12-31 DIAGNOSIS — H35352 Cystoid macular degeneration, left eye: Secondary | ICD-10-CM | POA: Diagnosis not present

## 2023-12-31 DIAGNOSIS — H43813 Vitreous degeneration, bilateral: Secondary | ICD-10-CM | POA: Diagnosis not present

## 2023-12-31 DIAGNOSIS — Z0100 Encounter for examination of eyes and vision without abnormal findings: Secondary | ICD-10-CM | POA: Diagnosis not present

## 2024-01-22 DIAGNOSIS — M8580 Other specified disorders of bone density and structure, unspecified site: Secondary | ICD-10-CM | POA: Diagnosis not present

## 2024-01-22 DIAGNOSIS — D84821 Immunodeficiency due to drugs: Secondary | ICD-10-CM | POA: Diagnosis not present

## 2024-01-22 DIAGNOSIS — G620 Drug-induced polyneuropathy: Secondary | ICD-10-CM | POA: Diagnosis not present

## 2024-01-22 DIAGNOSIS — M1990 Unspecified osteoarthritis, unspecified site: Secondary | ICD-10-CM | POA: Diagnosis not present

## 2024-01-22 DIAGNOSIS — C50919 Malignant neoplasm of unspecified site of unspecified female breast: Secondary | ICD-10-CM | POA: Diagnosis not present

## 2024-01-22 DIAGNOSIS — I739 Peripheral vascular disease, unspecified: Secondary | ICD-10-CM | POA: Diagnosis not present

## 2024-01-22 DIAGNOSIS — I10 Essential (primary) hypertension: Secondary | ICD-10-CM | POA: Diagnosis not present

## 2024-01-22 DIAGNOSIS — M5430 Sciatica, unspecified side: Secondary | ICD-10-CM | POA: Diagnosis not present

## 2024-01-22 DIAGNOSIS — E785 Hyperlipidemia, unspecified: Secondary | ICD-10-CM | POA: Diagnosis not present

## 2024-03-01 DIAGNOSIS — H9313 Tinnitus, bilateral: Secondary | ICD-10-CM | POA: Diagnosis not present

## 2024-03-01 DIAGNOSIS — H6123 Impacted cerumen, bilateral: Secondary | ICD-10-CM | POA: Diagnosis not present

## 2024-03-08 ENCOUNTER — Inpatient Hospital Stay: Payer: Medicare PPO | Attending: Radiation Oncology

## 2024-03-08 ENCOUNTER — Inpatient Hospital Stay: Payer: Medicare PPO | Admitting: Internal Medicine

## 2024-03-08 ENCOUNTER — Encounter: Payer: Self-pay | Admitting: Internal Medicine

## 2024-03-08 VITALS — BP 143/78 | HR 64 | Temp 97.8°F | Resp 19 | Ht 61.0 in | Wt 124.6 lb

## 2024-03-08 DIAGNOSIS — G629 Polyneuropathy, unspecified: Secondary | ICD-10-CM | POA: Insufficient documentation

## 2024-03-08 DIAGNOSIS — D708 Other neutropenia: Secondary | ICD-10-CM | POA: Insufficient documentation

## 2024-03-08 DIAGNOSIS — Z95828 Presence of other vascular implants and grafts: Secondary | ICD-10-CM

## 2024-03-08 DIAGNOSIS — C50211 Malignant neoplasm of upper-inner quadrant of right female breast: Secondary | ICD-10-CM | POA: Diagnosis present

## 2024-03-08 DIAGNOSIS — Z79811 Long term (current) use of aromatase inhibitors: Secondary | ICD-10-CM | POA: Insufficient documentation

## 2024-03-08 DIAGNOSIS — Z803 Family history of malignant neoplasm of breast: Secondary | ICD-10-CM | POA: Diagnosis not present

## 2024-03-08 DIAGNOSIS — Z170 Estrogen receptor positive status [ER+]: Secondary | ICD-10-CM

## 2024-03-08 DIAGNOSIS — M8580 Other specified disorders of bone density and structure, unspecified site: Secondary | ICD-10-CM | POA: Diagnosis not present

## 2024-03-08 LAB — CMP (CANCER CENTER ONLY)
ALT: 18 U/L (ref 0–44)
AST: 24 U/L (ref 15–41)
Albumin: 3.9 g/dL (ref 3.5–5.0)
Alkaline Phosphatase: 53 U/L (ref 38–126)
Anion gap: 7 (ref 5–15)
BUN: 14 mg/dL (ref 8–23)
CO2: 26 mmol/L (ref 22–32)
Calcium: 9 mg/dL (ref 8.9–10.3)
Chloride: 103 mmol/L (ref 98–111)
Creatinine: 0.98 mg/dL (ref 0.44–1.00)
GFR, Estimated: 59 mL/min — ABNORMAL LOW (ref >60)
Glucose, Bld: 105 mg/dL — ABNORMAL HIGH (ref 70–99)
Potassium: 3.5 mmol/L (ref 3.5–5.1)
Sodium: 136 mmol/L (ref 135–145)
Total Bilirubin: 0.9 mg/dL (ref 0.0–1.2)
Total Protein: 6.7 g/dL (ref 6.5–8.1)

## 2024-03-08 LAB — CBC WITH DIFFERENTIAL (CANCER CENTER ONLY)
Abs Immature Granulocytes: 0.01 K/uL (ref 0.00–0.07)
Basophils Absolute: 0 K/uL (ref 0.0–0.1)
Basophils Relative: 2 %
Eosinophils Absolute: 0.2 K/uL (ref 0.0–0.5)
Eosinophils Relative: 6 %
HCT: 38.1 % (ref 36.0–46.0)
Hemoglobin: 12.5 g/dL (ref 12.0–15.0)
Immature Granulocytes: 0 %
Lymphocytes Relative: 36 %
Lymphs Abs: 0.9 K/uL (ref 0.7–4.0)
MCH: 29.6 pg (ref 26.0–34.0)
MCHC: 32.8 g/dL (ref 30.0–36.0)
MCV: 90.1 fL (ref 80.0–100.0)
Monocytes Absolute: 0.2 K/uL (ref 0.1–1.0)
Monocytes Relative: 9 %
Neutro Abs: 1.2 K/uL — ABNORMAL LOW (ref 1.7–7.7)
Neutrophils Relative %: 47 %
Platelet Count: 300 K/uL (ref 150–400)
RBC: 4.23 MIL/uL (ref 3.87–5.11)
RDW: 12.3 % (ref 11.5–15.5)
WBC Count: 2.5 K/uL — ABNORMAL LOW (ref 4.0–10.5)
nRBC: 0 % (ref 0.0–0.2)

## 2024-03-08 LAB — VITAMIN D 25 HYDROXY (VIT D DEFICIENCY, FRACTURES): Vit D, 25-Hydroxy: 93.21 ng/mL (ref 30–100)

## 2024-03-08 MED ORDER — ANASTROZOLE 1 MG PO TABS: 1.0000 mg | ORAL_TABLET | Freq: Every day | ORAL | 1 refills | Status: DC

## 2024-03-08 NOTE — Progress Notes (Signed)
 Patient has no concerns

## 2024-03-08 NOTE — Addendum Note (Signed)
 Addended by: LAEL BROWNING A on: 03/08/2024 11:05 AM   Modules accepted: Orders

## 2024-03-08 NOTE — Assessment & Plan Note (Addendum)
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  Stable.  Tolerating very well; DEC 2024-Mammo-WNL.   # No clinical evidence of recurrence.  Continue anastrozole  at this time.  Tolerating without any major side effects.  Stable.  Refill sent.   # Osteopenia- BMD- FEB 2022- T score= -.1.9 [2020: T score-1.9]; Continue calcium and vitamin D ; DEC 2024- T-score of -1.7.Continue exercise/walking 3 miles/day-   stable.   # Chronic benign ethnic neutropenia /mild leucopenia- 2.3/ ANC-1.2 --Stable.    # Grade 2 neuropathy- s/p acupuncture--Neurontin  300 mg TID- stable.  # DISPOSITION:  # mamogram dec 2025.  # follow up in 6 months-MD/labs- cbc/cmp; vit D 25 levels -Dr.B.

## 2024-03-08 NOTE — Progress Notes (Signed)
 Cancer Center @ [facility] [phone]) [phone]  Fax:(336) [phone]   INITIAL CONSULT  Sandra [name] OB: Mar 28, 1946  MR#: [id number]  RDW#:[id number]  Patient Care Team: Wendee Lynwood HERO, NP as PCP - General (Family Medicine) [name] BIRCH, PA-C (Physician Assistant) Pyrtle, Gordy HERO, MD as Consulting Physician (Gastroenterology) Jenetta Holmes HERO, MD (Inactive) as Consulting Physician (Family Medicine) [name], MD (General Surgery) [name], MD as Consulting Physician (Oncology) [name], RN as Oncology Nurse Navigator Lenn Aran, MD as Referring Physician (Radiation Oncology)  CHIEF COMPLAINT:  Chief Complaint  Patient presents with   Follow-up    Carcinoma of upper-inner quadrant of right breast in female, estrogen receptor positive (HCC)     VISIT DIAGNOSIS:     ICD-10-CM   1. Carcinoma of upper-inner quadrant of right breast in female, estrogen receptor positive (HCC)  C50.211 anastrozole  (ARIMIDEX ) 1 MG tablet   Z17.0     2. Port-A-Cath in place  Z95.828 anastrozole  (ARIMIDEX ) 1 MG tablet       Oncology History Overview Note   carcinoma breast (right) inner and upper quadrant T1 cN0 M0 tumor Estrogen receptor positive progesterone receptor positive HER-2 receptor negative by fish Mammo print shows high risk (diagnosis in January of 2016) patient has 22% average risk in 5 years and 29% average risk in 10 years for distant metastectomy disease without chemotherapy on anti-hormonal treatment 2.  Patient started Cytoxan  and Adriamycin  on now August 30, 2015 MUGA scan shows ejection fraction of baseline 55%.  3.Patient has finished total of 4 cycles of chemotherapy on November 01, 2015 4.  Started on the Taxol  from November 22, 2015; FINISHED RT [sep 2017] # OCT 2017- START ARIMIDEX    # NOV 2021 BMD- T score= - 1.9 -OSTEOPENIA- ca+vit D  # SURVIVORSHIP: O  --------------------------------------------------------   DIAGNOSIS:  RIGHT BREAST CA  STAGE: I        ;GOALS: cure  CURRENT/MOST RECENT THERAPY: on anastrazole      Carcinoma of upper-inner quadrant of right breast in female, estrogen receptor positive (HCC)   INTERVAL HISTORY: Alone.  Ambulating independently.  78 year old lady History of stage I right-sided breast cancer ER/PR positive currently on extended Arimidex  is here for follow-up.   Taking anastrozole  daily. No side effects. Energy is very good. No concerns today.    Patient denies any worsening joint pains or bone pain.  Patient has intermittent cramps in the legs.  Otherwise denies any worsening tingling or numbness.  She continues with gabapentin .   Review of Systems  Constitutional:  Negative for chills, diaphoresis, fever, malaise/fatigue and weight loss.  HENT:  Negative for nosebleeds and sore throat.   Eyes:  Negative for double vision.  Respiratory:  Negative for cough, hemoptysis, sputum production, shortness of breath and wheezing.   Cardiovascular:  Negative for chest pain, palpitations, orthopnea and leg swelling.  Gastrointestinal:  Negative for abdominal pain, blood in stool, constipation, diarrhea, heartburn, melena, nausea and vomiting.  Genitourinary:  Negative for dysuria, frequency and urgency.  Musculoskeletal:  Negative for back pain and joint pain.  Skin: Negative.  Negative for itching and rash.  Neurological:  Positive for tingling. Negative for dizziness, focal weakness, weakness and headaches.  Endo/Heme/Allergies:  Does not bruise/bleed easily.  Psychiatric/Behavioral:  Negative for depression. The patient is not nervous/anxious and does not have insomnia.      PAST MEDICAL HISTORY: Past Medical History:  Diagnosis Date   Breast cancer (HCC) 07/13/2015   RIGHT lumpectomy  07/13/15, pT1c, pN0,M0 Stage 1 Er/PR pos, her 2 neg. 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, due to chemotherapy   Personal  history of chemotherapy 2016   Personal history of radiation therapy 2016   Visual blurriness    SOMETHING NEW THAT HAS DEVELOPED SINCE 07-13-15 SURGERY- SEEN AT [facility] ENT AND EXAM WAS NORMAL PER PT (08-09-15)    PAST SURGICAL HISTORY: Past Surgical History:  Procedure Laterality Date   ABDOMINAL HYSTERECTOMY  1987   BREAST BIOPSY Right    neg   BREAST BIOPSY Left    4 oclock, FIBROEPITHELIAL LESION FAVOR CELLULAR FIBROADENOMA   BREAST BIOPSY Left    2 oclock, CYSTIC APOCRINE METAPLASIA   BREAST BIOPSY Right 07/13/15   130 INVASIVE MAMMARY CARCINOMA, NO SPECIAL TYPE   BREAST EXCISIONAL BIOPSY Left 2016   fibroadenoma   BREAST LUMPECTOMY Left 07/13/2015   BREAST LUMPECTOMY WITH SENTINEL LYMPH NODE BIOPSY Right 07/13/2015   Procedure: BREAST LUMPECTOMY WITH SENTINEL LYMPH NODE BX;  Surgeon: Louanne KANDICE Muse, MD;  Location: [facility] ORS;  Service: General;  Laterality: Right;   BUNIONECTOMY Right 1993   CATARACT EXTRACTION W/PHACO Left 09/17/2016   Procedure: CATARACT EXTRACTION PHACO AND INTRAOCULAR LENS PLACEMENT (IOC);  Surgeon: Elsie Carmine, MD;  Location: [facility] ORS;  Service: Ophthalmology;  Laterality: Left;  US  00:47AP% 17.6CDE 8.39Fluid pack lot # [id number] H   CATARACT EXTRACTION W/PHACO Right 01/21/2017   Procedure: CATARACT EXTRACTION PHACO AND INTRAOCULAR LENS PLACEMENT (IOC);  Surgeon: Carmine Elsie, MD;  Location: [facility] ORS;  Service: Ophthalmology;  Laterality: Right;  US  00:39 AP% 13.0 CDE 5.16 fluid pack lot # [id number]   COLONOSCOPY  2015   COLONOSCOPY WITH PROPOFOL  N/A 12/18/2022   Procedure: COLONOSCOPY WITH PROPOFOL ;  Surgeon: Unk Corinn Skiff, MD;  Location: [facility] ENDOSCOPY;  Service: Gastroenterology;  Laterality: N/A;   fatty tumor Left 2013   side   PORT-A-CATH REMOVAL  2017   PORTACATH PLACEMENT Left 08/16/2015   Procedure: INSERTION PORT-A-CATH;  Surgeon: Louanne KANDICE Muse, MD;  Location: [facility] ORS;  Service: General;  Laterality: Left;    FAMILY  HISTORY Family History  Problem Relation Age of Onset   Cancer Sister 44       breast cancer   Breast cancer Sister 35   Colon cancer Neg Hx         ADVANCED DIRECTIVES:   Patient does not have any living will or healthcare power of attorney.  Information was given .  Available resources had been discussed.   HEALTH MAINTENANCE: Social History   Tobacco Use   Smoking status: Never   Smokeless tobacco: Never  Vaping Use   Vaping status: Never Used  Substance Use Topics   Alcohol use: No    Alcohol/week: 0.0 standard drinks of alcohol   Drug use: No      :  Allergies  Allergen Reactions   Cymbalta  [Duloxetine  Hcl] Other (See Comments)    Glaucoma.  Unable to take   Lovastatin Anaphylaxis    Tongue swelling    Current Outpatient Medications  Medication Sig Dispense Refill   amLODipine  (NORVASC ) 5 MG tablet Take 1 tablet (5 mg total) by mouth daily. 90 tablet 3   b complex vitamins tablet Take 1 tablet by mouth daily.     BIOTIN PO Take 5,000 mcg by mouth daily.     CALCIUM PO Take 1,000 mg by mouth daily.  COD LIVER OIL/VITAMINS A & D CAPS Take by mouth.     Flaxseed, Linseed, (FLAX SEEDS PO) Take by mouth daily.     gabapentin  (NEURONTIN ) 300 MG capsule Take 1 capsule (300 mg total) by mouth 3 (three) times daily. 270 capsule 3   Garlic 1000 MG CAPS Take by mouth.     lisinopril  (ZESTRIL ) 10 MG tablet Take 1 tablet (10 mg total) by mouth daily. 90 tablet 3   polyethylene glycol powder (GLYCOLAX/MIRALAX) 17 GM/SCOOP powder Take 1 Container by mouth daily.     Potassium 99 MG TABS Take 99 mg by mouth daily.      anastrozole  (ARIMIDEX ) 1 MG tablet Take 1 tablet (1 mg total) by mouth daily. 90 tablet 1   No current facility-administered medications for this visit.   Right and left BREAST exam [in the presence of nurse]- no unusual skin changes or dominant masses felt. Surgical scars noted.    OBJECTIVE: Physical Exam HENT:     Head: Normocephalic and  atraumatic.     Mouth/Throat:     Pharynx: No oropharyngeal exudate.  Eyes:     Pupils: Pupils are equal, round, and reactive to light.  Cardiovascular:     Rate and Rhythm: Normal rate and regular rhythm.  Pulmonary:     Effort: No respiratory distress.     Breath sounds: No wheezing.  Abdominal:     General: Bowel sounds are normal. There is no distension.     Palpations: Abdomen is soft. There is no mass.     Tenderness: There is no abdominal tenderness. There is no guarding or rebound.  Musculoskeletal:        General: No tenderness. Normal range of motion.     Cervical back: Normal range of motion and neck supple.  Skin:    General: Skin is warm.  Neurological:     Mental Status: She is alert and oriented to person, place, and time.  Psychiatric:        Mood and Affect: Affect normal.       Vitals:   03/08/24 1004  BP: (!) 143/78  Pulse: 64  Resp: 19  Temp: 97.8 F (36.6 C)  SpO2: 100%     Body mass index is 23.54 kg/m.    ECOG FS:0 - Asymptomatic  LAB RESULTS:  Appointment on 03/08/2024  Component Date Value Ref Range Status   Sodium 03/08/2024 136  135 - 145 mmol/L Final   Potassium 03/08/2024 3.5  3.5 - 5.1 mmol/L Final   Chloride 03/08/2024 103  98 - 111 mmol/L Final   CO2 03/08/2024 26  22 - 32 mmol/L Final   Glucose, Bld 03/08/2024 105 (H)  70 - 99 mg/dL Final   Glucose reference range applies only to samples taken after fasting for at least 8 hours.   BUN 03/08/2024 14  8 - 23 mg/dL Final   Creatinine 91/95/7974 0.98  0.44 - 1.00 mg/dL Final   Calcium 91/95/7974 9.0  8.9 - 10.3 mg/dL Final   Total Protein 91/95/7974 6.7  6.5 - 8.1 g/dL Final   Albumin 91/95/7974 3.9  3.5 - 5.0 g/dL Final   AST 91/95/7974 24  15 - 41 U/L Final   ALT 03/08/2024 18  0 - 44 U/L Final   Alkaline Phosphatase 03/08/2024 53  38 - 126 U/L Final   Total Bilirubin 03/08/2024 0.9  0.0 - 1.2 mg/dL Final   GFR, Estimated 03/08/2024 59 (L)  >60 mL/min Final   Comment:  (  NOTE) Calculated using the CKD-EPI Creatinine Equation (2021)    Anion gap 03/08/2024 7  5 - 15 Final   Performed at [facility], [address] [place], [place] [address]   WBC Count 03/08/2024 2.5 (L)  4.0 - 10.5 K/uL Final   RBC 03/08/2024 4.23  3.87 - 5.11 MIL/uL Final   Hemoglobin 03/08/2024 12.5  12.0 - 15.0 g/dL Final   HCT 91/95/7974 38.1  36.0 - 46.0 % Final   MCV 03/08/2024 90.1  80.0 - 100.0 fL Final   MCH 03/08/2024 29.6  26.0 - 34.0 pg Final   MCHC 03/08/2024 32.8  30.0 - 36.0 g/dL Final   RDW 91/95/7974 12.3  11.5 - 15.5 % Final   Platelet Count 03/08/2024 300  150 - 400 K/uL Final   nRBC 03/08/2024 0.0  0.0 - 0.2 % Final   Neutrophils Relative % 03/08/2024 47  % Final   Neutro Abs 03/08/2024 1.2 (L)  1.7 - 7.7 K/uL Final   Lymphocytes Relative 03/08/2024 36  % Final   Lymphs Abs 03/08/2024 0.9  0.7 - 4.0 K/uL Final   Monocytes Relative 03/08/2024 9  % Final   Monocytes Absolute 03/08/2024 0.2  0.1 - 1.0 K/uL Final   Eosinophils Relative 03/08/2024 6  % Final   Eosinophils Absolute 03/08/2024 0.2  0.0 - 0.5 K/uL Final   Basophils Relative 03/08/2024 2  % Final   Basophils Absolute 03/08/2024 0.0  0.0 - 0.1 K/uL Final   Immature Granulocytes 03/08/2024 0  % Final   Abs Immature Granulocytes 03/08/2024 0.01  0.00 - 0.07 K/uL Final   Performed at [facility], [address] [place], [place] [address]     STUDIES: No results found.  ASSESSMENT:   Carcinoma of upper-inner quadrant of right breast in female, estrogen receptor positive (HCC) # Stage I ER/PR positive HER-2/neu negative;  On Anastrazole on extended AI [until 2027].  Stable.  Tolerating very well; DEC 2024-Mammo-WNL.   # No clinical evidence of recurrence.  Continue anastrozole  at this time.  Tolerating without any major side effects.  Stable.  Refill sent.   # Osteopenia- BMD- FEB 2022- T score= -.1.9 [2020: T score-1.9]; Continue calcium and vitamin D ; DEC 2024- T-score of  -1.7.Continue exercise/walking 3 miles/day-   stable.   # Chronic benign ethnic neutropenia /mild leucopenia- 2.3/ ANC-1.2 --Stable.    # Grade 2 neuropathy- s/p acupuncture--Neurontin  300 mg TID- stable.  # DISPOSITION:  # mamogram dec 2025.  # follow up in 6 months-MD/labs- cbc/cmp; vit D 25 levels -Dr.B.    Cindy JONELLE Joe, MD   03/08/2024 10:56 AM

## 2024-05-31 ENCOUNTER — Encounter: Payer: Self-pay | Admitting: Nurse Practitioner

## 2024-05-31 ENCOUNTER — Ambulatory Visit: Payer: Medicare PPO | Admitting: Nurse Practitioner

## 2024-05-31 VITALS — BP 132/78 | HR 70 | Temp 98.2°F | Ht 61.0 in | Wt 122.8 lb

## 2024-05-31 DIAGNOSIS — Z170 Estrogen receptor positive status [ER+]: Secondary | ICD-10-CM | POA: Diagnosis not present

## 2024-05-31 DIAGNOSIS — Z0000 Encounter for general adult medical examination without abnormal findings: Secondary | ICD-10-CM

## 2024-05-31 DIAGNOSIS — I10 Essential (primary) hypertension: Secondary | ICD-10-CM

## 2024-05-31 DIAGNOSIS — G620 Drug-induced polyneuropathy: Secondary | ICD-10-CM | POA: Diagnosis not present

## 2024-05-31 DIAGNOSIS — Z23 Encounter for immunization: Secondary | ICD-10-CM | POA: Diagnosis not present

## 2024-05-31 DIAGNOSIS — C50211 Malignant neoplasm of upper-inner quadrant of right female breast: Secondary | ICD-10-CM

## 2024-05-31 DIAGNOSIS — E785 Hyperlipidemia, unspecified: Secondary | ICD-10-CM | POA: Diagnosis not present

## 2024-05-31 DIAGNOSIS — T451X5A Adverse effect of antineoplastic and immunosuppressive drugs, initial encounter: Secondary | ICD-10-CM

## 2024-05-31 LAB — CBC WITH DIFFERENTIAL/PLATELET
Basophils Absolute: 0.1 K/uL (ref 0.0–0.1)
Basophils Relative: 2.8 % (ref 0.0–3.0)
Eosinophils Absolute: 0.1 K/uL (ref 0.0–0.7)
Eosinophils Relative: 5.3 % — ABNORMAL HIGH (ref 0.0–5.0)
HCT: 37.4 % (ref 36.0–46.0)
Hemoglobin: 12.3 g/dL (ref 12.0–15.0)
Lymphocytes Relative: 36.8 % (ref 12.0–46.0)
Lymphs Abs: 0.9 K/uL (ref 0.7–4.0)
MCHC: 32.8 g/dL (ref 30.0–36.0)
MCV: 88.6 fl (ref 78.0–100.0)
Monocytes Absolute: 0.2 K/uL (ref 0.1–1.0)
Monocytes Relative: 9 % (ref 3.0–12.0)
Neutro Abs: 1.1 K/uL — ABNORMAL LOW (ref 1.4–7.7)
Neutrophils Relative %: 46.1 % (ref 43.0–77.0)
Platelets: 319 K/uL (ref 150.0–400.0)
RBC: 4.22 Mil/uL (ref 3.87–5.11)
RDW: 13 % (ref 11.5–15.5)
WBC: 2.4 K/uL — ABNORMAL LOW (ref 4.0–10.5)

## 2024-05-31 LAB — COMPREHENSIVE METABOLIC PANEL WITH GFR
ALT: 20 U/L (ref 0–35)
AST: 26 U/L (ref 0–37)
Albumin: 4 g/dL (ref 3.5–5.2)
Alkaline Phosphatase: 46 U/L (ref 39–117)
BUN: 12 mg/dL (ref 6–23)
CO2: 28 meq/L (ref 19–32)
Calcium: 9.1 mg/dL (ref 8.4–10.5)
Chloride: 104 meq/L (ref 96–112)
Creatinine, Ser: 0.9 mg/dL (ref 0.40–1.20)
GFR: 60.88 mL/min (ref >60.00)
Glucose, Bld: 103 mg/dL — ABNORMAL HIGH (ref 70–99)
Potassium: 3.8 meq/L (ref 3.5–5.1)
Sodium: 141 meq/L (ref 135–145)
Total Bilirubin: 0.7 mg/dL (ref 0.2–1.2)
Total Protein: 6.5 g/dL (ref 6.0–8.3)

## 2024-05-31 LAB — LIPID PANEL
Cholesterol: 192 mg/dL (ref 0–200)
HDL: 74.2 mg/dL (ref >39.00)
LDL Cholesterol: 107 mg/dL — ABNORMAL HIGH (ref 0–99)
NonHDL: 117.96
Total CHOL/HDL Ratio: 3
Triglycerides: 53 mg/dL (ref 0.0–149.0)
VLDL: 10.6 mg/dL (ref 0.0–40.0)

## 2024-05-31 LAB — TSH: TSH: 1.66 u[IU]/mL (ref 0.35–5.50)

## 2024-05-31 NOTE — Assessment & Plan Note (Signed)
 Followed by oncology maintained on gabapentin  300 mg 3 times daily.  Patient states does not offer much relief.  She is continue to take the medication continue medication as prescribed follow-up with specialist as recommended

## 2024-05-31 NOTE — Assessment & Plan Note (Signed)
 Patient currently maintained on amlodipine  5 mg daily and lisinopril  10 mg daily.  Blood pressure controlled.  Continue medication as prescribed

## 2024-05-31 NOTE — Assessment & Plan Note (Signed)
 Discussed age-appropriate immunizations and screening exams.  Did review patient's personal, surgical, social, family histories.  Patient is up-to-date with all age-appropriate vaccinations she would like.  Update flu vaccine today.  Patient is up-to-date and aged out of CRC screening.  Patient up-to-date on breast cancer screening and osteoporosis cancer screening.  Patient has had a hysterectomy so no longer participates in cervical cancer screening.  Patient was given information at discharge about preventative healthcare maintenance with anticipatory guidance.

## 2024-05-31 NOTE — Progress Notes (Signed)
 Established Patient Office Visit  Subjective   Patient ID: Sandra [name], female    DOB: May 31, 1946  Age: 78 y.o. MRN: [id number]  Chief Complaint  Patient presents with   Annual Exam    Flu vaccine    HPI  Breast cancer: Patient currently maintained on anastrozole  daily.Currently followed by Dr. Rennie.  Last visit was 03/08/2024  Neuropathy secondary to chemotherapy: Currently maintained on gabapentin  300 mg 3 times daily  HTN: Currently maintained on amlodipine  5 mg daily and lisinopril  10 mg daily. Does check it at home  for complete physical and follow up of chronic conditions.  Immunizations: -Tetanus: Completed in 2020 -Influenza:  update today  -Shingles: Completed all shingles vaccine? -Pneumonia: Completed   Diet: Fair diet. She is eating 2 meals a day. She does not snack a lot. Light breakfast and healthy dinner.  Water and lemon tea  Exercise: No regular exercise. Walking and yard work  Eye exam: Completes annually. Wears glasses  Dental exam: Completes semi-annually    Colonoscopy: Completed in 12/18/2022, aged out Lung Cancer Screening: NA  Pap smear: Hysterectomy   Mammogram: Scheduled 07/26/2024  DEXA: 07/24/2023 shows osteoporosis. Just currently on calcium.  Sleep: goes to bed around 11 and get up around 7. Does snore intermittently   Advance directive: Does have a living will       Review of Systems  Constitutional:  Negative for chills and fever.  Respiratory:  Negative for shortness of breath.   Cardiovascular:  Negative for chest pain and leg swelling.  Gastrointestinal:  Negative for abdominal pain, blood in stool, constipation, diarrhea, nausea and vomiting.       Bm daily   Genitourinary:  Negative for dysuria and hematuria.  Neurological:  Positive for tingling. Negative for headaches.  Psychiatric/Behavioral:  Negative for hallucinations and suicidal ideas.       Objective:     BP 132/78   Pulse 70   Temp  98.2 F (36.8 C) (Oral)   Ht 5' 1 (1.549 m)   Wt 122 lb 12.8 oz (55.7 kg)   LMP  (LMP Unknown)   SpO2 98%   BMI 23.20 kg/m  BP Readings from Last 3 Encounters:  05/31/24 132/78  03/08/24 (!) 143/78  09/09/23 126/77   Wt Readings from Last 3 Encounters:  05/31/24 122 lb 12.8 oz (55.7 kg)  03/08/24 124 lb 9.6 oz (56.5 kg)  09/09/23 123 lb 3.2 oz (55.9 kg)   SpO2 Readings from Last 3 Encounters:  05/31/24 98%  03/08/24 100%  09/09/23 100%      Physical Exam Vitals and nursing note reviewed.  Constitutional:      Appearance: Normal appearance.  HENT:     Right Ear: Tympanic membrane, ear canal and external ear normal.     Left Ear: Tympanic membrane, ear canal and external ear normal.     Mouth/Throat:     Mouth: Mucous membranes are moist.     Pharynx: Oropharynx is clear.  Eyes:     Extraocular Movements: Extraocular movements intact.     Pupils: Pupils are equal, round, and reactive to light.  Cardiovascular:     Rate and Rhythm: Normal rate and regular rhythm.     Pulses: Normal pulses.     Heart sounds: Normal heart sounds.  Pulmonary:     Effort: Pulmonary effort is normal.     Breath sounds: Normal breath sounds.  Abdominal:     General: Bowel sounds are normal. There is no  distension.     Palpations: There is no mass.     Tenderness: There is no abdominal tenderness.     Hernia: No hernia is present.  Musculoskeletal:     Right lower leg: No edema.     Left lower leg: No edema.  Lymphadenopathy:     Cervical: No cervical adenopathy.  Skin:    General: Skin is warm.  Neurological:     General: No focal deficit present.     Mental Status: She is alert.     Deep Tendon Reflexes:     Reflex Scores:      Bicep reflexes are 2+ on the right side and 2+ on the left side.      Patellar reflexes are 2+ on the right side and 2+ on the left side.    Comments: Bilateral upper and lower extremity strength 5/5  Psychiatric:        Mood and Affect: Mood normal.         Behavior: Behavior normal.        Thought Content: Thought content normal.        Judgment: Judgment normal.      Results for orders placed or performed in visit on 05/31/24  CBC with Differential/Platelet  Result Value Ref Range   WBC 2.4 Repeated and verified X2. (L) 4.0 - 10.5 K/uL   RBC 4.22 3.87 - 5.11 Mil/uL   Hemoglobin 12.3 12.0 - 15.0 g/dL   HCT 62.5 63.9 - 53.9 %   MCV 88.6 78.0 - 100.0 fl   MCHC 32.8 30.0 - 36.0 g/dL   RDW 86.9 88.4 - 84.4 %   Platelets 319.0 150.0 - 400.0 K/uL   Neutrophils Relative % 46.1 43.0 - 77.0 %   Lymphocytes Relative 36.8 12.0 - 46.0 %   Monocytes Relative 9.0 3.0 - 12.0 %   Eosinophils Relative 5.3 (H) 0.0 - 5.0 %   Basophils Relative 2.8 0.0 - 3.0 %   Neutro Abs 1.1 (L) 1.4 - 7.7 K/uL   Lymphs Abs 0.9 0.7 - 4.0 K/uL   Monocytes Absolute 0.2 0.1 - 1.0 K/uL   Eosinophils Absolute 0.1 0.0 - 0.7 K/uL   Basophils Absolute 0.1 0.0 - 0.1 K/uL  Comprehensive metabolic panel with GFR  Result Value Ref Range   Sodium 141 135 - 145 mEq/L   Potassium 3.8 3.5 - 5.1 mEq/L   Chloride 104 96 - 112 mEq/L   CO2 28 19 - 32 mEq/L   Glucose, Bld 103 (H) 70 - 99 mg/dL   BUN 12 6 - 23 mg/dL   Creatinine, Ser 9.09 0.40 - 1.20 mg/dL   Total Bilirubin 0.7 0.2 - 1.2 mg/dL   Alkaline Phosphatase 46 39 - 117 U/L   AST 26 0 - 37 U/L   ALT 20 0 - 35 U/L   Total Protein 6.5 6.0 - 8.3 g/dL   Albumin 4.0 3.5 - 5.2 g/dL   GFR 39.11 >39.99 mL/min   Calcium 9.1 8.4 - 10.5 mg/dL  Lipid panel  Result Value Ref Range   Cholesterol 192 0 - 200 mg/dL   Triglycerides 46.9 0.0 - 149.0 mg/dL   HDL 25.79 >60.99 mg/dL   VLDL 89.3 0.0 - 59.9 mg/dL   LDL Cholesterol 892 (H) 0 - 99 mg/dL   Total CHOL/HDL Ratio 3    NonHDL 117.96   TSH  Result Value Ref Range   TSH 1.66 0.35 - 5.50 uIU/mL      The 10-year  ASCVD risk score (Arnett DK, et al., 2019) is: 21.7%    Assessment & Plan:   Problem List Items Addressed This Visit       Cardiovascular and  Mediastinum   Hypertension   Patient currently maintained on amlodipine  5 mg daily and lisinopril  10 mg daily.  Blood pressure controlled.  Continue medication as prescribed      Relevant Orders   CBC with Differential/Platelet (Completed)   Comprehensive metabolic panel with GFR (Completed)   Lipid panel (Completed)   TSH (Completed)     Nervous and Auditory   Chemotherapy-induced neuropathy   Followed by oncology maintained on gabapentin  300 mg 3 times daily.  Patient states does not offer much relief.  She is continue to take the medication continue medication as prescribed follow-up with specialist as recommended      Relevant Orders   CBC with Differential/Platelet (Completed)   Comprehensive metabolic panel with GFR (Completed)   TSH (Completed)     Other   Hyperlipidemia   History of the same.  Pending lipid panel today      Relevant Orders   Lipid panel (Completed)   Carcinoma of upper-inner quadrant of right breast in female, estrogen receptor positive (HCC)   Followed by oncology.  Patient is up-to-date on mammograms.  She is on anastrozole .  Continue medication as prescribed follow-up with specialist as recommended      Preventative health care - Primary   Discussed age-appropriate immunizations and screening exams.  Did review patient's personal, surgical, social, family histories.  Patient is up-to-date with all age-appropriate vaccinations she would like.  Update flu vaccine today.  Patient is up-to-date and aged out of CRC screening.  Patient up-to-date on breast cancer screening and osteoporosis cancer screening.  Patient has had a hysterectomy so no longer participates in cervical cancer screening.  Patient was given information at discharge about preventative healthcare maintenance with anticipatory guidance.      Relevant Orders   CBC with Differential/Platelet (Completed)   Comprehensive metabolic panel with GFR (Completed)   TSH (Completed)   Other Visit  Diagnoses       Need for influenza vaccination       Relevant Orders   Flu vaccine HIGH DOSE PF(Fluzone Trivalent) (Completed)       Return in about 1 year (around 05/31/2025) for CPE and Labs.    Adina Crandall, NP

## 2024-05-31 NOTE — Patient Instructions (Signed)
Nice to see you today I will be in touch with the labs once I have them Follow up with me in 1 year, sooner if you need me We did update your flu vaccine today

## 2024-05-31 NOTE — Assessment & Plan Note (Signed)
 Followed by oncology.  Patient is up-to-date on mammograms.  She is on anastrozole .  Continue medication as prescribed follow-up with specialist as recommended

## 2024-05-31 NOTE — Assessment & Plan Note (Signed)
 History of the same.  Pending lipid panel today

## 2024-06-02 ENCOUNTER — Ambulatory Visit: Payer: Medicare PPO | Admitting: Dermatology

## 2024-06-02 ENCOUNTER — Ambulatory Visit: Payer: Self-pay | Admitting: Nurse Practitioner

## 2024-06-02 DIAGNOSIS — Z7189 Other specified counseling: Secondary | ICD-10-CM | POA: Diagnosis not present

## 2024-06-02 DIAGNOSIS — L811 Chloasma: Secondary | ICD-10-CM

## 2024-06-02 DIAGNOSIS — Z79899 Other long term (current) drug therapy: Secondary | ICD-10-CM

## 2024-06-02 NOTE — Patient Instructions (Signed)

## 2024-06-02 NOTE — Progress Notes (Signed)
   Follow-Up Visit   Subjective  Sandra [name] is a 78 y.o. female who presents for the following: Melasma of the face 1 year follow up - uses SM Hydroquinone  12%/kojic acid/vitamin C cream bid to dark spots on face for 3 months then d/c for 3 months.   The following portions of the chart were reviewed this encounter and updated as appropriate: medications, allergies, medical history  Review of Systems:  No other skin or systemic complaints except as noted in HPI or Assessment and Plan.  Objective  Well appearing patient in no apparent distress; mood and affect are within normal limits.  Areas Examined: face  Relevant physical exam findings are noted in the Assessment and Plan.    Assessment & Plan          MELASMA Exam: reticulated hyperpigmented patches at face  Chronic and persistent condition with duration or expected duration over one year. Condition is symptomatic/ bothersome to patient. Not currently at goal, but improved.  Melasma is a chronic; persistent condition of hyperpigmented patches generally on the face, worse in summer due to higher UV exposure.    Heredity; thyroid  disease; sun exposure; pregnancy; birth control pills; epilepsy medication and darker skin may predispose to Melasma.   Recommendations include: - Sun avoidance and daily broad spectrum (UVA/UVB) tinted mineral sunscreen SPF 30+, with Zinc or Titanium Dioxide. - Rx topical bleaching creams (i.e. hydroquinone ) is a common treatment but should not be used long term.  Hydroquinones may be mixed with retinoids; vitamin C; steroids; Kojic Acid. - Alastin A-luminate, retinoids, vitamin C, topical tranexamic acid, glycolic acid and kojic acid can be used for brightening while on break from hydroquinone  - Rx Azelaic Acid is also a treatment option that is safe for pregnancy (Category B). - OTC Heliocare can be helpful in control and prevention. - Oral Rx with Tranexamic Acid 250 mg - 650 mg  po daily can be used for moderate to severe cases especially during summer (contraindications include pregnancy; lactation; hx of PE; hx of DVT; clotting disorder; heart disease; anticoagulant use and upcoming long trips)   - Chemical peels (would need multiple for best result).  - Lasers and  Microdermabrasion may also be helpful adjunct treatments.  Treatment Plan: Restart SM Hydroquinone  12%/kojic acid/vitamin C cream bid to dark spots on face for 3 months then d/c for 3 months    Return in about 1 year (around 06/02/2025) for melasma follow up.  LILLETTE Rosina Mayans, CMA, am acting as scribe for Alm Rhyme, MD .   Documentation: I have reviewed the above documentation for accuracy and completeness, and I agree with the above.  Alm Rhyme, MD

## 2024-06-08 ENCOUNTER — Encounter: Payer: Self-pay | Admitting: Dermatology

## 2024-06-23 ENCOUNTER — Other Ambulatory Visit: Payer: Self-pay | Admitting: Nurse Practitioner

## 2024-06-23 DIAGNOSIS — I10 Essential (primary) hypertension: Secondary | ICD-10-CM

## 2024-07-15 ENCOUNTER — Other Ambulatory Visit: Payer: Self-pay | Admitting: Nurse Practitioner

## 2024-07-15 DIAGNOSIS — G620 Drug-induced polyneuropathy: Secondary | ICD-10-CM

## 2024-07-26 ENCOUNTER — Ambulatory Visit
Admission: RE | Admit: 2024-07-26 | Discharge: 2024-07-26 | Disposition: A | Discharge status: Home / Self Care | Source: Ambulatory Visit | Attending: Internal Medicine | Admitting: Internal Medicine

## 2024-07-26 DIAGNOSIS — Z853 Personal history of malignant neoplasm of breast: Secondary | ICD-10-CM | POA: Diagnosis not present

## 2024-07-26 DIAGNOSIS — Z95828 Presence of other vascular implants and grafts: Secondary | ICD-10-CM

## 2024-07-26 DIAGNOSIS — Z1231 Encounter for screening mammogram for malignant neoplasm of breast: Secondary | ICD-10-CM | POA: Diagnosis present

## 2024-07-26 DIAGNOSIS — Z170 Estrogen receptor positive status [ER+]: Secondary | ICD-10-CM

## 2024-07-27 ENCOUNTER — Ambulatory Visit
Admission: EM | Admit: 2024-07-27 | Discharge: 2024-07-27 | Disposition: A | Discharge status: Home / Self Care | Attending: Emergency Medicine | Admitting: Emergency Medicine

## 2024-07-27 DIAGNOSIS — L03012 Cellulitis of left finger: Secondary | ICD-10-CM

## 2024-07-27 MED ORDER — DOXYCYCLINE HYCLATE 100 MG PO CAPS: 100.0000 mg | ORAL_CAPSULE | Freq: Two times a day (BID) | ORAL | 0 refills | Status: AC

## 2024-07-27 NOTE — Discharge Instructions (Addendum)
Take the doxycycline as directed.  Follow up with your primary care provider.

## 2024-07-27 NOTE — ED Provider Notes (Signed)
 " Sandra [name]    CSN: [id number] Arrival date & time: 07/27/24  0805      History   Chief Complaint Chief Complaint  Patient presents with   Finger Injury    HPI Sandra [name] is a 78 y.o. female.  Patient presents with 5-day history of tender red swelling beside her fingernail on her left ring finger.  She had a small tear in her cuticle which she pulled off.  She has been treating the ear area with triple antibiotic ointment.  No fever, chills, purulent drainage.  The history is provided by the patient and medical records.    Past Medical History:  Diagnosis Date   Breast cancer (HCC) 07/13/2015   RIGHT lumpectomy 07/13/15, pT1c, pN0,M0 Stage 1 Er/PR pos, her 2 neg. 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, due to chemotherapy   Personal history of chemotherapy 2016   Personal history of radiation therapy 2016   Visual blurriness    SOMETHING NEW THAT HAS DEVELOPED SINCE 07-13-15 SURGERY- SEEN AT [facility] AND EXAM WAS NORMAL PER PT (08-09-15)    Patient Active Problem List   Diagnosis Date Noted   Lateral epicondylitis of right elbow 05/30/2023   Change in bowel habits 12/18/2022   Erythema of rectum 12/18/2022   Flatus 07/10/2022   Bloating 07/10/2022   Change in stool 07/10/2022   Preventative health care 05/29/2021   Sciatica of left side 02/28/2021   Establishing care with new doctor, encounter for 02/28/2021   Carcinoma of upper-inner quadrant of right breast in female, estrogen receptor positive (HCC) 01/31/2016   Chemotherapy-induced neuropathy 01/17/2016   Medicare annual wellness visit, subsequent 04/06/2014   Hypertension    Hyperlipidemia     Past Surgical History:  Procedure Laterality Date   ABDOMINAL HYSTERECTOMY  1987   BREAST BIOPSY Right    neg   BREAST BIOPSY Left    4 oclock, FIBROEPITHELIAL LESION FAVOR CELLULAR FIBROADENOMA   BREAST BIOPSY Left    2  oclock, CYSTIC APOCRINE METAPLASIA   BREAST BIOPSY Right 07/13/15   130 INVASIVE MAMMARY CARCINOMA, NO SPECIAL TYPE   BREAST EXCISIONAL BIOPSY Left 2016   fibroadenoma   BREAST LUMPECTOMY Left 07/13/2015   BREAST LUMPECTOMY WITH SENTINEL LYMPH NODE BIOPSY Right 07/13/2015   Procedure: BREAST LUMPECTOMY WITH SENTINEL LYMPH NODE BX;  Surgeon: Louanne KANDICE Muse, MD;  Location: [facility] ORS;  Service: General;  Laterality: Right;   BUNIONECTOMY Right 1993   CATARACT EXTRACTION W/PHACO Left 09/17/2016   Procedure: CATARACT EXTRACTION PHACO AND INTRAOCULAR LENS PLACEMENT (IOC);  Surgeon: Elsie Carmine, MD;  Location: [facility] ORS;  Service: Ophthalmology;  Laterality: Left;  US  00:47AP% 17.6CDE 8.39Fluid pack lot # [id number]   CATARACT EXTRACTION W/PHACO Right 01/21/2017   Procedure: CATARACT EXTRACTION PHACO AND INTRAOCULAR LENS PLACEMENT (IOC);  Surgeon: Carmine Elsie, MD;  Location: [facility] ORS;  Service: Ophthalmology;  Laterality: Right;  US  00:39 AP% 13.0 CDE 5.16 fluid pack lot # [id number]   COLONOSCOPY  2015   COLONOSCOPY WITH PROPOFOL  N/A 12/18/2022   Procedure: COLONOSCOPY WITH PROPOFOL ;  Surgeon: Unk Corinn Skiff, MD;  Location: [facility] ENDOSCOPY;  Service: Gastroenterology;  Laterality: N/A;   fatty tumor Left 2013   side   PORT-A-CATH REMOVAL  2017   PORTACATH PLACEMENT Left 08/16/2015   Procedure: INSERTION PORT-A-CATH;  Surgeon: Louanne KANDICE Muse, MD;  Location: [facility] ORS;  Service: General;  Laterality:  Left;    OB History   No obstetric history on file.      Home Medications    Prior to Admission medications  Medication Sig Start Date End Date Taking? Authorizing Provider  doxycycline  (VIBRAMYCIN ) 100 MG capsule Take 1 capsule (100 mg total) by mouth 2 (two) times daily for 7 days. 07/27/24 08/03/24 Yes Corlis Burnard DEL, NP  amLODipine  (NORVASC ) 5 MG tablet Take 1 tablet (5 mg total) by mouth daily. 05/30/23   Wendee Lynwood HERO, NP  anastrozole  (ARIMIDEX ) 1 MG tablet Take 1  tablet (1 mg total) by mouth daily. 03/08/24   Brahmanday, Govinda R, MD  b complex vitamins tablet Take 1 tablet by mouth daily.    [provider]  BIOTIN PO Take 5,000 mcg by mouth daily.    [provider]  CALCIUM PO Take 1,000 mg by mouth daily.    [provider]  COD LIVER OIL/VITAMINS A & D CAPS Take by mouth.    [provider]  Flaxseed, Linseed, (FLAX SEEDS PO) Take by mouth daily.    [provider]  gabapentin  (NEURONTIN ) 300 MG capsule TAKE 1 CAPSULE BY MOUTH THREE TIMES DAILY 07/16/24   Wendee Lynwood HERO, NP  Garlic 1000 MG CAPS Take by mouth.    [provider]  lisinopril  (ZESTRIL ) 10 MG tablet Take 1 tablet by mouth once daily 06/23/24   Wendee Lynwood HERO, NP  polyethylene glycol powder (GLYCOLAX/MIRALAX) 17 GM/SCOOP powder Take 1 Container by mouth daily.    [provider]  Potassium 99 MG TABS Take 99 mg by mouth daily.     [provider]    Family History Family History  Problem Relation Age of Onset   Cancer Sister 36       breast cancer   Breast cancer Sister 43   Colon cancer Neg Hx     Social History Social History[1]   Allergies   Cymbalta  [duloxetine  hcl] and Lovastatin   Review of Systems Review of Systems  Constitutional:  Negative for chills and fever.  Musculoskeletal:  Negative for arthralgias and joint swelling.  Skin:  Positive for color change and wound.  Neurological:  Negative for weakness and numbness.     Physical Exam Triage Vital Signs ED Triage Vitals  Encounter Vitals Group     BP 07/27/24 0820 (!) 141/77     Girls Systolic BP Percentile --      Girls Diastolic BP Percentile --      Boys Systolic BP Percentile --      Boys Diastolic BP Percentile --      Pulse Rate 07/27/24 0820 77     Resp 07/27/24 0820 18     Temp 07/27/24 0820 97.7 F (36.5 C)     Temp src --      SpO2 07/27/24 0820 97 %     Weight --      Height --      Head Circumference --       Peak Flow --      Pain Score 07/27/24 0825 2     Pain Loc --      Pain Education --      Exclude from Growth Chart --    No data found.  Updated Vital Signs BP (!) 141/77   Pulse 77   Temp 97.7 F (36.5 C)   Resp 18   LMP  (LMP Unknown)   SpO2 97%   Visual Acuity Right Eye  Distance:   Left Eye Distance:   Bilateral Distance:    Right Eye Near:   Left Eye Near:    Bilateral Near:     Physical Exam Constitutional:      General: She is not in acute distress. HENT:     Mouth/Throat:     Mouth: Mucous membranes are moist.  Cardiovascular:     Rate and Rhythm: Normal rate.  Pulmonary:     Effort: Pulmonary effort is normal. No respiratory distress.  Musculoskeletal:        General: No deformity. Normal range of motion.  Skin:    General: Skin is warm and dry.     Capillary Refill: Capillary refill takes less than 2 seconds.     Findings: Erythema and lesion present.     Comments: Tender paronychia of left ring finger with localized erythema.  No drainage or pus pocket.  No indication for I&D at this time.  Neurological:     General: No focal deficit present.     Mental Status: She is alert.     Sensory: No sensory deficit.     Motor: No weakness.      UC Treatments / Results  Labs (all labs ordered are listed, but only abnormal results are displayed) Labs Reviewed - No data to display  EKG   Radiology No results found.  Procedures Procedures (including critical care time)  Medications Ordered in UC Medications - No data to display  Initial Impression / Assessment and Plan / UC Course  I have reviewed the triage vital signs and the nursing notes.  Pertinent labs & imaging results that were available during my care of the patient were reviewed by me and considered in my medical decision making (see chart for details).    Paronychia of left ring finger.  No indication for I&D at this time.  Treating with doxycycline .  Wound care instructions and signs  of worsening infection given.  Instructed her to follow-up with her PCP.  Education provided on paronychia.  She agrees to plan of care.  Final Clinical Impressions(s) / UC Diagnoses   Final diagnoses:  Paronychia of left ring finger     Discharge Instructions      Take the doxycycline  as directed.  Follow up with your primary care provider.        ED Prescriptions     Medication Sig Dispense Auth. Provider   doxycycline  (VIBRAMYCIN ) 100 MG capsule Take 1 capsule (100 mg total) by mouth 2 (two) times daily for 7 days. 14 capsule Corlis Burnard DEL, NP      PDMP not reviewed this encounter.    [1]  Social History Tobacco Use   Smoking status: Never   Smokeless tobacco: Never  Vaping Use   Vaping status: Never Used  Substance Use Topics   Alcohol use: No    Alcohol/week: 0.0 standard drinks of alcohol   Drug use: No     Corlis Burnard DEL, NP 07/27/24 [phone]  "

## 2024-07-27 NOTE — ED Triage Notes (Signed)
 Patient to Urgent Care with complaints of redness/ swelling around her left sided, ring finger.  Has been bathing the area.   Symptoms x5 days.

## 2024-08-13 ENCOUNTER — Encounter: Payer: Self-pay | Admitting: Internal Medicine

## 2024-08-13 ENCOUNTER — Inpatient Hospital Stay: Attending: Internal Medicine

## 2024-08-13 ENCOUNTER — Inpatient Hospital Stay: Admitting: Internal Medicine

## 2024-08-13 DIAGNOSIS — Z95828 Presence of other vascular implants and grafts: Secondary | ICD-10-CM | POA: Insufficient documentation

## 2024-08-13 DIAGNOSIS — Z170 Estrogen receptor positive status [ER+]: Secondary | ICD-10-CM | POA: Diagnosis not present

## 2024-08-13 DIAGNOSIS — Z923 Personal history of irradiation: Secondary | ICD-10-CM | POA: Insufficient documentation

## 2024-08-13 DIAGNOSIS — Z803 Family history of malignant neoplasm of breast: Secondary | ICD-10-CM | POA: Insufficient documentation

## 2024-08-13 DIAGNOSIS — Z9221 Personal history of antineoplastic chemotherapy: Secondary | ICD-10-CM | POA: Diagnosis not present

## 2024-08-13 DIAGNOSIS — M8580 Other specified disorders of bone density and structure, unspecified site: Secondary | ICD-10-CM | POA: Diagnosis not present

## 2024-08-13 DIAGNOSIS — G629 Polyneuropathy, unspecified: Secondary | ICD-10-CM | POA: Diagnosis not present

## 2024-08-13 DIAGNOSIS — Z79811 Long term (current) use of aromatase inhibitors: Secondary | ICD-10-CM | POA: Diagnosis not present

## 2024-08-13 DIAGNOSIS — D709 Neutropenia, unspecified: Secondary | ICD-10-CM | POA: Diagnosis not present

## 2024-08-13 DIAGNOSIS — C50211 Malignant neoplasm of upper-inner quadrant of right female breast: Secondary | ICD-10-CM | POA: Insufficient documentation

## 2024-08-13 LAB — CMP (CANCER CENTER ONLY)
ALT: 18 U/L (ref 0–44)
AST: 29 U/L (ref 15–41)
Albumin: 4.3 g/dL (ref 3.5–5.0)
Alkaline Phosphatase: 58 U/L (ref 38–126)
Anion gap: 10 (ref 5–15)
BUN: 13 mg/dL (ref 8–23)
CO2: 27 mmol/L (ref 22–32)
Calcium: 9.5 mg/dL (ref 8.9–10.3)
Chloride: 105 mmol/L (ref 98–111)
Creatinine: 0.91 mg/dL (ref 0.44–1.00)
GFR, Estimated: >60 mL/min
Glucose, Bld: 102 mg/dL — ABNORMAL HIGH (ref 70–99)
Potassium: 3.9 mmol/L (ref 3.5–5.1)
Sodium: 142 mmol/L (ref 135–145)
Total Bilirubin: 0.5 mg/dL (ref 0.0–1.2)
Total Protein: 7 g/dL (ref 6.5–8.1)

## 2024-08-13 LAB — CBC WITH DIFFERENTIAL (CANCER CENTER ONLY)
Abs Immature Granulocytes: 0 K/uL (ref 0.00–0.07)
Basophils Absolute: 0 K/uL (ref 0.0–0.1)
Basophils Relative: 1 %
Eosinophils Absolute: 0.1 K/uL (ref 0.0–0.5)
Eosinophils Relative: 4 %
HCT: 38 % (ref 36.0–46.0)
Hemoglobin: 12.5 g/dL (ref 12.0–15.0)
Immature Granulocytes: 0 %
Lymphocytes Relative: 34 %
Lymphs Abs: 1 K/uL (ref 0.7–4.0)
MCH: 29.3 pg (ref 26.0–34.0)
MCHC: 32.9 g/dL (ref 30.0–36.0)
MCV: 89.2 fL (ref 80.0–100.0)
Monocytes Absolute: 0.2 K/uL (ref 0.1–1.0)
Monocytes Relative: 8 %
Neutro Abs: 1.5 K/uL — ABNORMAL LOW (ref 1.7–7.7)
Neutrophils Relative %: 53 %
Platelet Count: 298 K/uL (ref 150–400)
RBC: 4.26 MIL/uL (ref 3.87–5.11)
RDW: 12.5 % (ref 11.5–15.5)
WBC Count: 2.9 K/uL — ABNORMAL LOW (ref 4.0–10.5)
nRBC: 0 % (ref 0.0–0.2)

## 2024-08-13 LAB — VITAMIN D 25 HYDROXY (VIT D DEFICIENCY, FRACTURES): Vit D, 25-Hydroxy: 119 ng/mL — ABNORMAL HIGH (ref 30–100)

## 2024-08-13 MED ORDER — ANASTROZOLE 1 MG PO TABS: 1.0000 mg | ORAL_TABLET | Freq: Every day | ORAL | 1 refills | Status: AC

## 2024-08-13 NOTE — Progress Notes (Signed)
 "  Cancer Center @ [facility] Telephone:(336) [phone]  Fax:(336) [phone]   INITIAL CONSULT  Sandra [name] OB: Dec 26, 1945  MR#: [id number]  RDW#:[id number]  Patient Care Team: Wendee Lynwood HERO, NP as PCP - General (Family Medicine) [name] BIRCH, PA-C (Physician Assistant) Pyrtle, Gordy HERO, MD as Consulting Physician (Gastroenterology) Jenetta Holmes HERO, MD (Inactive) as Consulting Physician (Family Medicine) [name], MD (General Surgery) [name], MD as Consulting Physician (Oncology) [name], RN as Oncology Nurse Navigator Lenn Aran, MD as Referring Physician (Radiation Oncology)  CHIEF COMPLAINT:  Chief Complaint  Patient presents with   Follow-up    Carcinoma of upper-inner quadrant of right breast in female, estrogen receptor positive (HCC)   VISIT DIAGNOSIS:     ICD-10-CM   1. Carcinoma of upper-inner quadrant of right breast in female, estrogen receptor positive (HCC)  C50.211 anastrozole  (ARIMIDEX ) 1 MG tablet   Z17.0 CBC with Differential (Cancer Center Only)    CMP (Cancer Center only)    VITAMIN D  25 Hydroxy (Vit-D Deficiency, Fractures)    DG Bone Density    2. Port-A-Cath in place  Z95.828 anastrozole  (ARIMIDEX ) 1 MG tablet       Oncology History Overview Note   carcinoma breast (right) inner and upper quadrant T1 cN0 M0 tumor Estrogen receptor positive progesterone receptor positive HER-2 receptor negative by fish Mammo print shows high risk (diagnosis in January of 2016) patient has 22% average risk in 5 years and 29% average risk in 10 years for distant metastectomy disease without chemotherapy on anti-hormonal treatment 2.  Patient started Cytoxan  and Adriamycin  on now August 30, 2015 MUGA scan shows ejection fraction of baseline 55%.  3.Patient has finished total of 4 cycles of chemotherapy on November 01, 2015 4.  Started on the Taxol  from November 22, 2015; FINISHED RT [sep 2017] # OCT 2017- START ARIMIDEX    # NOV  2021 BMD- T score= - 1.9 -OSTEOPENIA- ca+vit D  # SURVIVORSHIP: O  --------------------------------------------------------   DIAGNOSIS: RIGHT BREAST CA  STAGE: I        ;GOALS: cure  CURRENT/MOST RECENT THERAPY: on anastrazole      Carcinoma of upper-inner quadrant of right breast in female, estrogen receptor positive (HCC)   INTERVAL HISTORY: Alone.  Ambulating independently.  79 year old lady History of stage I right-sided breast cancer ER/PR positive currently on extended Arimidex  is here for follow-up.   Discussed the use of AI scribe software for clinical note transcription with the patient, who gave verbal consent to proceed.  History of Present Illness   Sandra [name] is a 79 year old female with estrogen receptor positive right breast cancer who presents for routine oncology follow-up and medication refill.  She continues adjuvant anastrozole  therapy, taking her medication daily without missed doses, and requests a refill. She is asymptomatic, with no new complaints or concerns since her last visit, and reports feeling well.  Surveillance mammogram in December 2025 was unremarkable. Laboratory studies show longstanding mild leukopenia with recent improvement in white blood cell counts and no associated symptoms. Renal and hepatic function are stable, and blood glucose is within normal limits.  She maintains an active lifestyle, working in her yard and expressing interest in increasing outdoor walking, though recent weather and indoor activities have limited her exercise. She did not travel over the holidays and reports a good holiday season.  She remains physically active.     Review of Systems  Constitutional:  Negative for chills, diaphoresis, fever, malaise/fatigue and weight  loss.  HENT:  Negative for nosebleeds and sore throat.   Eyes:  Negative for double vision.  Respiratory:  Negative for cough, hemoptysis, sputum production, shortness of breath and  wheezing.   Cardiovascular:  Negative for chest pain, palpitations, orthopnea and leg swelling.  Gastrointestinal:  Negative for abdominal pain, blood in stool, constipation, diarrhea, heartburn, melena, nausea and vomiting.  Genitourinary:  Negative for dysuria, frequency and urgency.  Musculoskeletal:  Negative for back pain and joint pain.  Skin: Negative.  Negative for itching and rash.  Neurological:  Positive for tingling. Negative for dizziness, focal weakness, weakness and headaches.  Endo/Heme/Allergies:  Does not bruise/bleed easily.  Psychiatric/Behavioral:  Negative for depression. The patient is not nervous/anxious and does not have insomnia.      PAST MEDICAL HISTORY: Past Medical History:  Diagnosis Date   Breast cancer (HCC) 07/13/2015   RIGHT lumpectomy 07/13/15, pT1c, pN0,M0 Stage 1 Er/PR pos, her 2 neg. 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, due to chemotherapy   Personal history of chemotherapy 2016   Personal history of radiation therapy 2016   Visual blurriness    SOMETHING NEW THAT HAS DEVELOPED SINCE 07-13-15 SURGERY- SEEN AT [facility] AND EXAM WAS NORMAL PER PT (08-09-15)    PAST SURGICAL HISTORY: Past Surgical History:  Procedure Laterality Date   ABDOMINAL HYSTERECTOMY  1987   BREAST BIOPSY Right    neg   BREAST BIOPSY Left    4 oclock, FIBROEPITHELIAL LESION FAVOR CELLULAR FIBROADENOMA   BREAST BIOPSY Left    2 oclock, CYSTIC APOCRINE METAPLASIA   BREAST BIOPSY Right 07/13/15   130 INVASIVE MAMMARY CARCINOMA, NO SPECIAL TYPE   BREAST EXCISIONAL BIOPSY Left 2016   fibroadenoma   BREAST LUMPECTOMY Left 07/13/2015   BREAST LUMPECTOMY WITH SENTINEL LYMPH NODE BIOPSY Right 07/13/2015   Procedure: BREAST LUMPECTOMY WITH SENTINEL LYMPH NODE BX;  Surgeon: Louanne KANDICE Muse, MD;  Location: [facility];  Service: General;  Laterality: Right;   BUNIONECTOMY Right 1993   CATARACT EXTRACTION  W/PHACO Left 09/17/2016   Procedure: CATARACT EXTRACTION PHACO AND INTRAOCULAR LENS PLACEMENT (IOC);  Surgeon: Elsie Carmine, MD;  Location: [facility];  Service: Ophthalmology;  Laterality: Left;  US  00:47AP% 17.6CDE 8.39Fluid pack lot # [id number]   CATARACT EXTRACTION W/PHACO Right 01/21/2017   Procedure: CATARACT EXTRACTION PHACO AND INTRAOCULAR LENS PLACEMENT (IOC);  Surgeon: Carmine Elsie, MD;  Location: [facility];  Service: Ophthalmology;  Laterality: Right;  US  00:39 AP% 13.0 CDE 5.16 fluid pack lot # [id number]   COLONOSCOPY  2015   COLONOSCOPY WITH PROPOFOL  N/A 12/18/2022   Procedure: COLONOSCOPY WITH PROPOFOL ;  Surgeon: Unk Corinn Skiff, MD;  Location: [facility];  Service: Gastroenterology;  Laterality: N/A;   fatty tumor Left 2013   side   PORT-A-CATH REMOVAL  2017   PORTACATH PLACEMENT Left 08/16/2015   Procedure: INSERTION PORT-A-CATH;  Surgeon: Louanne KANDICE Muse, MD;  Location: [facility];  Service: General;  Laterality: Left;    FAMILY HISTORY Family History  Problem Relation Age of Onset   Cancer Sister 27       breast cancer   Breast cancer Sister 65   Colon cancer Neg Hx         ADVANCED DIRECTIVES:   Patient does not have any living will or healthcare power of attorney.  Information was given .  Available resources had been discussed.   HEALTH MAINTENANCE: Social  History   Tobacco Use   Smoking status: Never   Smokeless tobacco: Never  Vaping Use   Vaping status: Never Used  Substance Use Topics   Alcohol use: No    Alcohol/week: 0.0 standard drinks of alcohol   Drug use: No      :  Allergies  Allergen Reactions   Cymbalta  [Duloxetine  Hcl] Other (See Comments)    Glaucoma.  Unable to take   Lovastatin Anaphylaxis    Tongue swelling    Current Outpatient Medications  Medication Sig Dispense Refill   amLODipine  (NORVASC ) 5 MG tablet Take 1 tablet (5 mg total) by mouth daily. 90 tablet 3   b complex vitamins tablet Take 1 tablet by  mouth daily.     BIOTIN PO Take 5,000 mcg by mouth daily.     CALCIUM PO Take 1,000 mg by mouth daily.     COD LIVER OIL/VITAMINS A & D CAPS Take by mouth.     Flaxseed, Linseed, (FLAX SEEDS PO) Take by mouth daily.     gabapentin  (NEURONTIN ) 300 MG capsule TAKE 1 CAPSULE BY MOUTH THREE TIMES DAILY 270 capsule 0   Garlic 1000 MG CAPS Take by mouth.     lisinopril  (ZESTRIL ) 10 MG tablet Take 1 tablet by mouth once daily 90 tablet 2   polyethylene glycol powder (GLYCOLAX/MIRALAX) 17 GM/SCOOP powder Take 1 Container by mouth daily as needed.     Potassium 99 MG TABS Take 99 mg by mouth daily.      psyllium (METAMUCIL) 58.6 % packet Take 1 packet by mouth every other day.     anastrozole  (ARIMIDEX ) 1 MG tablet Take 1 tablet (1 mg total) by mouth daily. 90 tablet 1   No current facility-administered medications for this visit.   Right and left BREAST exam [in the presence of nurse]- no unusual skin changes or dominant masses felt. Surgical scars noted.    OBJECTIVE: Physical Exam HENT:     Head: Normocephalic and atraumatic.     Mouth/Throat:     Pharynx: No oropharyngeal exudate.  Eyes:     Pupils: Pupils are equal, round, and reactive to light.  Cardiovascular:     Rate and Rhythm: Normal rate and regular rhythm.  Pulmonary:     Effort: No respiratory distress.     Breath sounds: No wheezing.  Abdominal:     General: Bowel sounds are normal. There is no distension.     Palpations: Abdomen is soft. There is no mass.     Tenderness: There is no abdominal tenderness. There is no guarding or rebound.  Musculoskeletal:        General: No tenderness. Normal range of motion.     Cervical back: Normal range of motion and neck supple.  Skin:    General: Skin is warm.  Neurological:     Mental Status: She is alert and oriented to person, place, and time.  Psychiatric:        Mood and Affect: Affect normal.       Vitals:   08/13/24 1018 08/13/24 1039  BP: (!) 157/91 137/76   Pulse: 70   Resp: 18   Temp: (!) 95.7 F (35.4 C)   SpO2: 100%      Body mass index is 23.32 kg/m.    ECOG FS:0 - Asymptomatic  LAB RESULTS:  Appointment on 08/13/2024  Component Date Value Ref Range Status   Sodium 08/13/2024 142  135 - 145 mmol/L Final   Potassium 08/13/2024  3.9  3.5 - 5.1 mmol/L Final   Chloride 08/13/2024 105  98 - 111 mmol/L Final   CO2 08/13/2024 27  22 - 32 mmol/L Final   Glucose, Bld 08/13/2024 102 (H)  70 - 99 mg/dL Final   Glucose reference range applies only to samples taken after fasting for at least 8 hours.   BUN 08/13/2024 13  8 - 23 mg/dL Final   Creatinine 98/90/7973 0.91  0.44 - 1.00 mg/dL Final   Calcium 98/90/7973 9.5  8.9 - 10.3 mg/dL Final   Total Protein 98/90/7973 7.0  6.5 - 8.1 g/dL Final   Albumin 98/90/7973 4.3  3.5 - 5.0 g/dL Final   AST 98/90/7973 29  15 - 41 U/L Final   ALT 08/13/2024 18  0 - 44 U/L Final   Alkaline Phosphatase 08/13/2024 58  38 - 126 U/L Final   Total Bilirubin 08/13/2024 0.5  0.0 - 1.2 mg/dL Final   GFR, Estimated 08/13/2024 >60  >60 mL/min Final   Comment: (NOTE) Calculated using the CKD-EPI Creatinine Equation (2021)    Anion gap 08/13/2024 10  5 - 15 Final   Performed at [facility], [address] [place], [place] [address]   WBC Count 08/13/2024 2.9 (L)  4.0 - 10.5 K/uL Final   RBC 08/13/2024 4.26  3.87 - 5.11 MIL/uL Final   Hemoglobin 08/13/2024 12.5  12.0 - 15.0 g/dL Final   HCT 98/90/7973 38.0  36.0 - 46.0 % Final   MCV 08/13/2024 89.2  80.0 - 100.0 fL Final   MCH 08/13/2024 29.3  26.0 - 34.0 pg Final   MCHC 08/13/2024 32.9  30.0 - 36.0 g/dL Final   RDW 98/90/7973 12.5  11.5 - 15.5 % Final   Platelet Count 08/13/2024 298  150 - 400 K/uL Final   nRBC 08/13/2024 0.0  0.0 - 0.2 % Final   Neutrophils Relative % 08/13/2024 53  % Final   Neutro Abs 08/13/2024 1.5 (L)  1.7 - 7.7 K/uL Final   Lymphocytes Relative 08/13/2024 34  % Final   Lymphs Abs 08/13/2024 1.0  0.7 - 4.0 K/uL Final    Monocytes Relative 08/13/2024 8  % Final   Monocytes Absolute 08/13/2024 0.2  0.1 - 1.0 K/uL Final   Eosinophils Relative 08/13/2024 4  % Final   Eosinophils Absolute 08/13/2024 0.1  0.0 - 0.5 K/uL Final   Basophils Relative 08/13/2024 1  % Final   Basophils Absolute 08/13/2024 0.0  0.0 - 0.1 K/uL Final   Immature Granulocytes 08/13/2024 0  % Final   Abs Immature Granulocytes 08/13/2024 0.00  0.00 - 0.07 K/uL Final   Performed at [facility], [address] [place], [place] [address]     STUDIES: MM 3D SCREENING MAMMOGRAM BILATERAL BREAST Result Date: 07/30/2024 CLINICAL DATA:  Screening. EXAM: DIGITAL SCREENING BILATERAL MAMMOGRAM WITH TOMOSYNTHESIS AND CAD TECHNIQUE: Bilateral screening digital craniocaudal and mediolateral oblique mammograms were obtained. Bilateral screening digital breast tomosynthesis was performed. The images were evaluated with computer-aided detection. COMPARISON:  Previous exam(s). ACR Breast Density Category b: There are scattered areas of fibroglandular density. FINDINGS: There are no findings suspicious for malignancy. IMPRESSION: No mammographic evidence of malignancy. A result letter of this screening mammogram will be mailed directly to the patient. RECOMMENDATION: Screening mammogram in one year. (Code:[id number]-B-01Y) BI-RADS CATEGORY  1: Negative. Electronically Signed   By: Dina  Arceo M.D.   On: 07/30/2024 18:43    ASSESSMENT:   Carcinoma of upper-inner quadrant of right breast  in female, estrogen receptor positive (HCC) # Stage I ER/PR positive HER-2/neu negative;  On Anastrazole on extended AI [until 2027].  Stable.  Tolerating very well; DEC 2025-Mammo-WNL.   # No clinical evidence of recurrence.  Continue anastrozole  at this time.  Tolerating without any major side effects.  Stable.  Refill sent.   # Osteopenia- BMD- FEB 2022- T score= -.1.9 [2020: T score-1.9]; Continue calcium and vitamin D ; DEC 2024- T-score of -1.7.Continue exercise/walking 3  miles/day-   stable. Will repeat BMD at next visit.   # Chronic benign ethnic neutropenia /mild leucopenia- 2.9/ ANC-1.2 --Stable.    # Grade 2 neuropathy- s/p acupuncture--Neurontin  300 mg TID- stable.  # DISPOSITION:  # follow up in 6 months-MD/labs- cbc/cmp; vit D 25 levels; BMD- -Dr.B.     Cindy JONELLE Joe, MD   08/13/2024 11:15 AM "

## 2024-08-13 NOTE — Assessment & Plan Note (Addendum)
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  Stable.  Tolerating very well; DEC 2025-Mammo-WNL.   # No clinical evidence of recurrence.  Continue anastrozole  at this time.  Tolerating without any major side effects.  Stable.  Refill sent.   # Osteopenia- BMD- FEB 2022- T score= -.1.9 [2020: T score-1.9]; Continue calcium and vitamin D ; DEC 2024- T-score of -1.7.Continue exercise/walking 3 miles/day-   stable. Will repeat BMD at next visit.   # Chronic benign ethnic neutropenia /mild leucopenia- 2.9/ ANC-1.2 --Stable.    # Grade 2 neuropathy- s/p acupuncture--Neurontin  300 mg TID- stable.  # DISPOSITION:  # follow up in 6 months-MD/labs- cbc/cmp; vit D 25 levels; BMD- -Dr.B.

## 2024-08-13 NOTE — Progress Notes (Signed)
 Mammogram 07/26/24.  Needs refill of the anastrozole , pended.

## 2024-08-16 ENCOUNTER — Ambulatory Visit: Payer: Self-pay | Admitting: Internal Medicine

## 2024-08-17 NOTE — Progress Notes (Signed)
 Vit d ordered for next visit. Pt hasn't seen mychart message yet, will check later and call her if needed.

## 2024-08-17 NOTE — Progress Notes (Signed)
 Pt.notified

## 2024-08-18 ENCOUNTER — Other Ambulatory Visit: Payer: Self-pay | Admitting: Nurse Practitioner

## 2024-08-18 DIAGNOSIS — I10 Essential (primary) hypertension: Secondary | ICD-10-CM

## 2024-08-19 ENCOUNTER — Ambulatory Visit
Admission: RE | Admit: 2024-08-19 | Discharge: 2024-08-19 | Disposition: A | Discharge status: Home / Self Care | Source: Ambulatory Visit | Attending: Internal Medicine | Admitting: Internal Medicine

## 2024-08-19 DIAGNOSIS — Z780 Asymptomatic menopausal state: Secondary | ICD-10-CM | POA: Insufficient documentation

## 2024-08-19 DIAGNOSIS — Z170 Estrogen receptor positive status [ER+]: Secondary | ICD-10-CM | POA: Diagnosis present

## 2024-08-19 DIAGNOSIS — C50211 Malignant neoplasm of upper-inner quadrant of right female breast: Secondary | ICD-10-CM | POA: Insufficient documentation

## 2024-08-19 DIAGNOSIS — M8580 Other specified disorders of bone density and structure, unspecified site: Secondary | ICD-10-CM | POA: Diagnosis not present

## 2024-08-23 ENCOUNTER — Ambulatory Visit: Payer: Self-pay | Admitting: Internal Medicine

## 2024-08-23 NOTE — Progress Notes (Signed)
 FYI

## 2025-02-16 ENCOUNTER — Inpatient Hospital Stay: Admitting: Internal Medicine

## 2025-02-16 ENCOUNTER — Inpatient Hospital Stay

## 2025-06-01 ENCOUNTER — Ambulatory Visit: Admitting: Dermatology

## 2025-06-01 ENCOUNTER — Encounter: Admitting: Nurse Practitioner
